# Patient Record
Sex: Female | Born: 1945
Health system: Southern US, Community
[De-identification: ages and names within clinical notes are randomized; demographics above are authoritative.]

## PROBLEM LIST (undated history)

## (undated) DIAGNOSIS — K219 Gastro-esophageal reflux disease without esophagitis: Secondary | ICD-10-CM

## (undated) DIAGNOSIS — F419 Anxiety disorder, unspecified: Secondary | ICD-10-CM

## (undated) DIAGNOSIS — E785 Hyperlipidemia, unspecified: Secondary | ICD-10-CM

## (undated) DIAGNOSIS — I4891 Unspecified atrial fibrillation: Secondary | ICD-10-CM

## (undated) DIAGNOSIS — I10 Essential (primary) hypertension: Secondary | ICD-10-CM

## (undated) DIAGNOSIS — G459 Transient cerebral ischemic attack, unspecified: Secondary | ICD-10-CM

## (undated) DIAGNOSIS — Z923 Personal history of irradiation: Secondary | ICD-10-CM

## (undated) HISTORY — PX: VAGINAL HYSTERECTOMY: SHX2639

## (undated) HISTORY — PX: TONSILLECTOMY: SUR1361

## (undated) HISTORY — PX: RECTOCELE REPAIR: SHX761

## (undated) HISTORY — DX: Transient cerebral ischemic attack, unspecified: G45.9

## (undated) HISTORY — PX: KNEE SURGERY: SHX244

---

## 1999-02-05 ENCOUNTER — Encounter: Payer: Self-pay | Admitting: Gastroenterology

## 1999-02-05 ENCOUNTER — Ambulatory Visit (HOSPITAL_COMMUNITY): Admission: RE | Admit: 1999-02-05 | Discharge: 1999-02-05 | Payer: Self-pay | Admitting: Gastroenterology

## 1999-12-19 ENCOUNTER — Ambulatory Visit (HOSPITAL_BASED_OUTPATIENT_CLINIC_OR_DEPARTMENT_OTHER): Admission: RE | Admit: 1999-12-19 | Discharge: 1999-12-19 | Payer: Self-pay | Admitting: Otolaryngology

## 1999-12-19 ENCOUNTER — Encounter (INDEPENDENT_AMBULATORY_CARE_PROVIDER_SITE_OTHER): Payer: Self-pay

## 2000-10-18 ENCOUNTER — Emergency Department (HOSPITAL_COMMUNITY): Admission: EM | Admit: 2000-10-18 | Discharge: 2000-10-18 | Payer: Self-pay | Admitting: Emergency Medicine

## 2001-01-05 ENCOUNTER — Encounter: Payer: Self-pay | Admitting: Emergency Medicine

## 2001-01-05 ENCOUNTER — Emergency Department (HOSPITAL_COMMUNITY): Admission: EM | Admit: 2001-01-05 | Discharge: 2001-01-05 | Payer: Self-pay | Admitting: Emergency Medicine

## 2001-10-23 ENCOUNTER — Emergency Department (HOSPITAL_COMMUNITY): Admission: EM | Admit: 2001-10-23 | Discharge: 2001-10-23 | Payer: Self-pay | Admitting: Emergency Medicine

## 2002-08-22 ENCOUNTER — Encounter: Payer: Self-pay | Admitting: Family Medicine

## 2002-08-22 ENCOUNTER — Ambulatory Visit (HOSPITAL_COMMUNITY): Admission: RE | Admit: 2002-08-22 | Discharge: 2002-08-22 | Payer: Self-pay | Admitting: Family Medicine

## 2004-03-07 ENCOUNTER — Encounter (INDEPENDENT_AMBULATORY_CARE_PROVIDER_SITE_OTHER): Payer: Self-pay | Admitting: *Deleted

## 2004-03-07 ENCOUNTER — Inpatient Hospital Stay (HOSPITAL_COMMUNITY): Admission: RE | Admit: 2004-03-07 | Discharge: 2004-03-09 | Payer: Self-pay | Admitting: Obstetrics and Gynecology

## 2004-10-27 ENCOUNTER — Ambulatory Visit: Payer: Self-pay | Admitting: Family Medicine

## 2005-01-29 ENCOUNTER — Ambulatory Visit: Payer: Self-pay | Admitting: Family Medicine

## 2005-04-30 ENCOUNTER — Ambulatory Visit: Payer: Self-pay | Admitting: Family Medicine

## 2005-06-15 ENCOUNTER — Ambulatory Visit: Payer: Self-pay | Admitting: Family Medicine

## 2005-09-28 ENCOUNTER — Ambulatory Visit: Payer: Self-pay | Admitting: Family Medicine

## 2005-10-28 ENCOUNTER — Ambulatory Visit: Payer: Self-pay | Admitting: Family Medicine

## 2006-02-03 ENCOUNTER — Ambulatory Visit: Payer: Self-pay | Admitting: Family Medicine

## 2006-05-04 ENCOUNTER — Ambulatory Visit: Payer: Self-pay | Admitting: Family Medicine

## 2006-08-11 ENCOUNTER — Ambulatory Visit: Payer: Self-pay | Admitting: Family Medicine

## 2006-09-29 ENCOUNTER — Ambulatory Visit: Payer: Self-pay | Admitting: Family Medicine

## 2006-12-29 ENCOUNTER — Ambulatory Visit: Payer: Self-pay | Admitting: Family Medicine

## 2007-03-09 ENCOUNTER — Ambulatory Visit: Payer: Self-pay | Admitting: Family Medicine

## 2007-03-30 ENCOUNTER — Ambulatory Visit: Payer: Self-pay | Admitting: Family Medicine

## 2007-05-25 ENCOUNTER — Ambulatory Visit: Payer: Self-pay | Admitting: Family Medicine

## 2009-09-05 ENCOUNTER — Encounter: Admission: RE | Admit: 2009-09-05 | Discharge: 2009-12-04 | Payer: Self-pay | Admitting: Specialist

## 2011-04-24 NOTE — H&P (Signed)
NAME:  [name] Collier, [name] Collier                          ACCOUNT NO.:  [id number]   MEDICAL RECORD NO.:  [id number]                   PATIENT TYPE:  INP   LOCATION:  NA                                   FACILITY:  [facility]   PHYSICIAN:  Katherine Roan, M.D.               DATE OF BIRTH:  12-Apr-1946   DATE OF ADMISSION:  DATE OF DISCHARGE:                                HISTORY & PHYSICAL   CHIEF COMPLAINT:  Pelvic relaxation with pelvic pressure, feeling that  things are falling out, minimal stress incontinence.   HISTORY OF PRESENT ILLNESS:  [name] Collier is a 65 year old gravida 2, para 2,  female who is on Premarin who complains of pelvic pressure, feeling that  things are falling out, and a documented large 3rd degree cystocele and  rectocele.   MEDICATIONS:  Protonix, Cozaar, and Premarin.   ALLERGIES:  LODINE and PREDNISONE.   She has a history of a tubal sterilization, tonsillectomy, vag hys and  questionable repair in the past.   Her medical physician is Dr. Lysbeth Galas.   HEENT:  She wears glasses but no headaches, no dizziness, no change in  visual or auditory acuity.  HEART:  She is followed with hypertension and  fairly well controlled on Cozaar.  No chest pain.  No history of mitral  valve prolapse.  LUNGS:  No chronic cough, no asthma.  GU:  She has minimal  stress incontinence, mostly pelvic pressure and frequency.  GASTROINTESTINAL:  She is treated for reflux.  She denies any bowel habit  change or melena.  No weight loss or gain.  MUSCLES/BONES/JOINTS:  No  fractures or arthritis.   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   FAMILY HISTORY:  Her mother died at 23.  Her father died in his 22s.  She  has one brother and two sisters who are in good health.  She has maternal  and paternal uncle with prostate and intestinal malignancy.  Mother had  hypertension.  She has a sister with lupus and a maternal grandmother with  lupus.   PHYSICAL EXAMINATION:  GENERAL:   Reveals a well-developed, nourished female  who appears to be of stated age.  EYES/EARS/NOSE/THROAT:  Exam is unremarkable.  The oropharynx is not  injected.  NECK:  Supple.  Carotid pulses are equal without bruits.  Trachea is in the  midline.  BREASTS:  No masses or tenderness.  LUNGS:  Clear.  HEART:  Normal sinus rhythm.  No murmurs.  No heaves, thrills, rubs, or  gallops.  ABDOMEN:  Soft.  Liver, spleen or kidneys are not palpated.  No tenderness.  Bowel sounds are normal.  No bruits are heard.  PELVIC:  Examination reveals a 3rd degree cystocele and rectocele.  I do not  detect an enterocele.  No pelvic masses are noted.  EXTREMITIES:  Show good range of motion, equal pulses and  reflexes.   IMPRESSION:  Symptomatic pelvic relaxation with cystocele and rectocele.   PLAN:  Pelvic reconstruction.  We discussed with [name] Collier the risks which  included damage to bladder and bowel, infection and hemorrhage, and also  discussed the failure rate.  I also discussed the fact that she may have  dyspareunia and urge incontinence postop.  Patient expresses understanding  of all the risks and benefits.                                               Katherine Roan, M.D.    SDM/MEDQ  D:  03/05/2004  T:  03/05/2004  Job:  [id number]

## 2011-04-24 NOTE — Discharge Summary (Signed)
NAME:  [name] Collier, [name] Collier                          ACCOUNT NO.:  [id number]   MEDICAL RECORD NO.:  [id number]                   PATIENT TYPE:  INP   LOCATION:  0448                                 FACILITY:  [facility]   PHYSICIAN:  Katherine Roan, M.D.               DATE OF BIRTH:  [birth year]   DATE OF ADMISSION:  03/07/2004  DATE OF DISCHARGE:  03/09/2004                                 DISCHARGE SUMMARY   ADMISSION DIAGNOSES:  Cystocele, rectocele, and enterocele.   COMORBIDITIES:  1. Hypertension.  2. Esophageal reflux.   BRIEF HISTORY:  Ms. Pritts is a 65 year old female who was admitted to the  hospital with symptomatic cystocele and rectocele with pelvic pressure, and  the feeling that things were falling out.  She had had a vaginal  hysterectomy in the past, and questionable anterior repair.   LABORATORY DATA:  Routine chemistry was within normal limits revealing a  creatinine of 0.7.  A white count was 4000, hemoglobin 13.  Echocardiogram  was within normal limits.  Chest x-ray was normal.   HOSPITAL COURSE:  The patient was admitted to the hospital and underwent an  uneventful exam under anesthesia, anterior repair, enterocele repair,  posterior repair, and perineoplasty.  Her postoperative course was  uncomplicated.  She remained afebrile and without complaints.  The day prior  to discharge, she had had some low back pain and was doing well.  Examination on the day of discharge was normal.  There was no CVA  tenderness, no calf tenderness.  The abdomen was soft and non-acute.   DISCHARGE INSTRUCTIONS:  She was discharged with instructions to take  Percocet for pain, to drink plenty of fluids, and take a laxative in 2 days.  She is to stay on her stool softener and her preadmission medication.   CONDITION ON DISCHARGE:  Improved.                                               Katherine Roan, M.D.    SDM/MEDQ  D:  03/13/2004  T:  03/14/2004  Job:  [id number]

## 2011-04-24 NOTE — Op Note (Signed)
[facility]  Patient:    [name] Collier                        MRN: [id number] Proc. Date: 12/19/99 Adm. Date:  [id number] Attending:  Carlean Purl CC:         Delaney Meigs, M.D.                           Operative Report  PREOPERATIVE DIAGNOSIS:  Right neck adenopathy.  POSTOPERATIVE DIAGNOSIS:  Right neck adenopathy.  OPERATION:  Excision of deep right neck node.  SURGEON:  Kristine Garbe. Ezzard Standing, M.D.  ANESTHESIA:  MAC.  COMPLICATIONS:  None.  INDICATION:  [name] Collier is a 65 year old female who has had persistent right eck adenopathy since November.  She first noticed this during a case of bronchitis nd was treated with several rounds of antibiotics.  She has had persistent and enlarged right neck nodes and is taken to the operating room at this time for excisional biopsy of deep right neck node.  DESCRIPTION OF PROCEDURE:  After adequate IV sedation, the patients neck was prepped with Betadine solution and draped out in sterile towels.  The planned excision site was marked and injected with Xylocaine with epinephrine for local  anesthetic.  An incision was made in the mid-upper right neck just anterior to the sternocleidomastoid muscle.  The external jugular vein was transected and ligated with 3-0 chromic suture.  The sternocleidomastoid muscle was reflected posteriorly and the jugular chain of lymph nodes were identified.  [name] Collier had several enlarged high jugular lymph nodes.  These measured approximately 1.5 to 2 cm in size. Several of the lymph nodes were removed and sent to pathology fresh in saline.  Hemostasis was obtained with electrocautery and 3-0 chromic ligatures.  Wound was irrigated with saline and closed with 3-0 chromic sutures subcutaneously and 5-0 nylon on the skin.  [name] Collier tolerated the procedure well and was subsequently discharged home later this morning.  Of note, she received 1 gm  Ancef IV intraoperatively after removal of  lymph node.  DISPOSITION:  [name] Collier is discharged home on Tylenol and Tylenol No. 3 p.r.n. pain. We will have her follow up in my office in six days to review pathology and have sutures removed. DD:  12/19/99 TD:  12/19/99 Job: [id number]

## 2011-04-24 NOTE — Op Note (Signed)
NAME:  [name] Collier, [name] Collier                          ACCOUNT NO.:  [id number]   MEDICAL RECORD NO.:  [id number]                   PATIENT TYPE:  INP   LOCATION:  0006                                 FACILITY:  [facility]   PHYSICIAN:  Katherine Roan, M.D.               DATE OF BIRTH:  1946-08-30   DATE OF PROCEDURE:  03/07/2004  DATE OF DISCHARGE:                                 OPERATIVE REPORT   PREOPERATIVE DIAGNOSIS:  Pelvic relaxation.   POSTOPERATIVE DIAGNOSIS:  Pelvic relaxation.   OPERATION:  1. Pelvic exam under anesthesia.  2. Anterior repair.  3. Enterocele repair.  4. Posterior repair.  5. Perineoplasty.   DESCRIPTION OF PROCEDURE:  The patient was placed in lithotomy and  positioned correctly with Allen stirrups.  The bladder was quite full.  No  other pelvic masses were noted.  She was then prepped and draped in the  usual fashion, and the apex of the vagina was grasped with Allis clamps.  We  infiltrated the vagina with 1% Xylocaine with epinephrine and dissected the  enterocele with sharp dissection.  We then dissected anteriorly and  dissected the vagina from the underlying pubocervical vaginal fascia.  The  enterocele repair was then effected by closing this defect with interrupted  sutures of 3-0 Ethibond and 3-0 and 4-0 Vicryl.  The cystocele repair was  then performed using interrupted sutures of 3-0 Vicryl and 3-0 Ethibond.  The pubocervical fascia was plicated in the midline quite nicely.  Following  this, hemostasis was accomplished with the Bovie.  Wedge of the vagina was  then made and the vagina pushed from the enterocele repair was closed with  subcutaneous 3-0 Vicryl.  Anteriorly, we sutured the apex with one suture of  3-0 Vicryl, following which we closed the remaining portion of the vagina  and enterocele repair with a running locking suture of 3-0 chromic.  Following this, we then went to the rectocele, made a wedge resection in the  perineal body, and  then excised this revealing the underlying transverse  perinei muscle which had been divided.  The vagina was then dissected, and  the prerectal fascia was identified and then closed with interrupted sutures  of 3-0 Vicryl and 3-0 Ethibond.  Excellent repair was then obtained.  Multiple small sutures were used for the repair.  Following this, I closed  the vagina with one identifying suture of 3-0 Vicryl, following which we  used a 3-0 chromic locking suture for the posterior vault as well.  The  bulbocavernosus muscle was then approximated with interrupted sutures of 3-0  Vicryl, and the transverse perinei muscle was closed with this suture.  The  skin was closed with two subcuticular 3-0 Vicryls.  The crown stitch was  then utilized from the bulbocavernosus muscle at the fourchette.  Then the  vagina was closed with a continuation of the 3-0 chromic suture.  The  subcuticular suture was used to close the perineoplasty.  Following this,  the vagina was of excellent length, and I inserted an Iodoform pack and then  infiltrated the vagina with 20 mL of 1% Xylocaine with epinephrine.  [name] tolerated the procedure well and was sent to the recovery room in  good condition.                                               Katherine Roan, M.D.    SDM/MEDQ  D:  03/07/2004  T:  03/07/2004  Job:  [id number]

## 2011-04-27 ENCOUNTER — Other Ambulatory Visit: Payer: Self-pay | Admitting: Gastroenterology

## 2011-07-07 ENCOUNTER — Ambulatory Visit: Payer: Self-pay | Admitting: Physical Therapy

## 2011-07-14 ENCOUNTER — Ambulatory Visit: Payer: Managed Care, Other (non HMO) | Attending: Specialist | Admitting: Physical Therapy

## 2011-07-14 DIAGNOSIS — IMO0001 Reserved for inherently not codable concepts without codable children: Secondary | ICD-10-CM | POA: Insufficient documentation

## 2011-07-14 DIAGNOSIS — R5381 Other malaise: Secondary | ICD-10-CM | POA: Insufficient documentation

## 2011-07-14 DIAGNOSIS — M25569 Pain in unspecified knee: Secondary | ICD-10-CM | POA: Insufficient documentation

## 2011-07-14 DIAGNOSIS — R269 Unspecified abnormalities of gait and mobility: Secondary | ICD-10-CM | POA: Insufficient documentation

## 2011-07-14 DIAGNOSIS — M25669 Stiffness of unspecified knee, not elsewhere classified: Secondary | ICD-10-CM | POA: Insufficient documentation

## 2011-07-16 ENCOUNTER — Ambulatory Visit: Payer: Managed Care, Other (non HMO) | Admitting: Physical Therapy

## 2011-07-20 ENCOUNTER — Ambulatory Visit: Payer: Managed Care, Other (non HMO) | Admitting: Physical Therapy

## 2011-07-22 ENCOUNTER — Ambulatory Visit: Payer: Managed Care, Other (non HMO) | Admitting: Physical Therapy

## 2011-07-24 ENCOUNTER — Ambulatory Visit: Payer: Managed Care, Other (non HMO) | Admitting: Physical Therapy

## 2011-07-27 ENCOUNTER — Ambulatory Visit: Payer: Managed Care, Other (non HMO) | Admitting: Physical Therapy

## 2011-07-29 ENCOUNTER — Ambulatory Visit: Payer: Managed Care, Other (non HMO) | Admitting: Physical Therapy

## 2011-07-31 ENCOUNTER — Ambulatory Visit: Payer: Managed Care, Other (non HMO) | Admitting: *Deleted

## 2011-08-05 ENCOUNTER — Ambulatory Visit: Payer: Managed Care, Other (non HMO) | Admitting: Physical Therapy

## 2011-08-11 ENCOUNTER — Ambulatory Visit: Payer: Medicare Other | Attending: Specialist | Admitting: Physical Therapy

## 2011-08-11 DIAGNOSIS — IMO0001 Reserved for inherently not codable concepts without codable children: Secondary | ICD-10-CM | POA: Insufficient documentation

## 2011-08-11 DIAGNOSIS — M25569 Pain in unspecified knee: Secondary | ICD-10-CM | POA: Insufficient documentation

## 2011-08-11 DIAGNOSIS — R5381 Other malaise: Secondary | ICD-10-CM | POA: Insufficient documentation

## 2011-08-11 DIAGNOSIS — M25669 Stiffness of unspecified knee, not elsewhere classified: Secondary | ICD-10-CM | POA: Insufficient documentation

## 2011-08-11 DIAGNOSIS — R269 Unspecified abnormalities of gait and mobility: Secondary | ICD-10-CM | POA: Insufficient documentation

## 2011-08-13 ENCOUNTER — Ambulatory Visit: Payer: Medicare Other | Admitting: Physical Therapy

## 2011-08-17 ENCOUNTER — Encounter: Payer: Managed Care, Other (non HMO) | Admitting: Physical Therapy

## 2013-04-05 ENCOUNTER — Other Ambulatory Visit: Payer: Self-pay | Admitting: Gastroenterology

## 2014-04-12 DIAGNOSIS — R591 Generalized enlarged lymph nodes: Secondary | ICD-10-CM | POA: Insufficient documentation

## 2016-02-26 ENCOUNTER — Emergency Department (HOSPITAL_COMMUNITY): Payer: Medicare Other

## 2016-02-26 ENCOUNTER — Observation Stay (HOSPITAL_COMMUNITY): Payer: Medicare Other

## 2016-02-26 ENCOUNTER — Observation Stay (HOSPITAL_COMMUNITY)
Admission: EM | Admit: 2016-02-26 | Discharge: 2016-02-27 | Disposition: A | Discharge status: Home / Self Care | Payer: Medicare Other | Attending: Oncology | Admitting: Oncology

## 2016-02-26 ENCOUNTER — Encounter (HOSPITAL_COMMUNITY): Payer: Self-pay | Admitting: Neurology

## 2016-02-26 DIAGNOSIS — F419 Anxiety disorder, unspecified: Secondary | ICD-10-CM | POA: Diagnosis not present

## 2016-02-26 DIAGNOSIS — E785 Hyperlipidemia, unspecified: Secondary | ICD-10-CM | POA: Diagnosis not present

## 2016-02-26 DIAGNOSIS — K219 Gastro-esophageal reflux disease without esophagitis: Secondary | ICD-10-CM | POA: Insufficient documentation

## 2016-02-26 DIAGNOSIS — Z823 Family history of stroke: Secondary | ICD-10-CM | POA: Diagnosis not present

## 2016-02-26 DIAGNOSIS — R531 Weakness: Secondary | ICD-10-CM | POA: Insufficient documentation

## 2016-02-26 DIAGNOSIS — Z7982 Long term (current) use of aspirin: Secondary | ICD-10-CM | POA: Diagnosis not present

## 2016-02-26 DIAGNOSIS — D649 Anemia, unspecified: Secondary | ICD-10-CM | POA: Diagnosis not present

## 2016-02-26 DIAGNOSIS — Z880 Allergy status to penicillin: Secondary | ICD-10-CM | POA: Diagnosis not present

## 2016-02-26 DIAGNOSIS — G458 Other transient cerebral ischemic attacks and related syndromes: Secondary | ICD-10-CM

## 2016-02-26 DIAGNOSIS — G459 Transient cerebral ischemic attack, unspecified: Principal | ICD-10-CM | POA: Insufficient documentation

## 2016-02-26 DIAGNOSIS — R4701 Aphasia: Secondary | ICD-10-CM

## 2016-02-26 DIAGNOSIS — Z66 Do not resuscitate: Secondary | ICD-10-CM | POA: Diagnosis not present

## 2016-02-26 DIAGNOSIS — R4781 Slurred speech: Secondary | ICD-10-CM | POA: Diagnosis not present

## 2016-02-26 DIAGNOSIS — Z6841 Body Mass Index (BMI) 40.0 and over, adult: Secondary | ICD-10-CM | POA: Insufficient documentation

## 2016-02-26 DIAGNOSIS — I10 Essential (primary) hypertension: Secondary | ICD-10-CM | POA: Insufficient documentation

## 2016-02-26 DIAGNOSIS — Z90710 Acquired absence of both cervix and uterus: Secondary | ICD-10-CM | POA: Insufficient documentation

## 2016-02-26 DIAGNOSIS — R471 Dysarthria and anarthria: Secondary | ICD-10-CM | POA: Diagnosis not present

## 2016-02-26 HISTORY — DX: Gastro-esophageal reflux disease without esophagitis: K21.9

## 2016-02-26 HISTORY — DX: Hyperlipidemia, unspecified: E78.5

## 2016-02-26 HISTORY — DX: Essential (primary) hypertension: I10

## 2016-02-26 LAB — DIFFERENTIAL
Basophils Absolute: 0.1 10*3/uL (ref 0.0–0.1)
Basophils Relative: 1 %
EOS PCT: 2 %
Eosinophils Absolute: 0.1 10*3/uL (ref 0.0–0.7)
LYMPHS PCT: 26 %
Lymphs Abs: 2 10*3/uL (ref 0.7–4.0)
MONO ABS: 0.7 10*3/uL (ref 0.1–1.0)
MONOS PCT: 9 %
Neutro Abs: 4.8 10*3/uL (ref 1.7–7.7)
Neutrophils Relative %: 62 %

## 2016-02-26 LAB — RAPID URINE DRUG SCREEN, HOSP PERFORMED
AMPHETAMINES: NOT DETECTED
Barbiturates: NOT DETECTED
Benzodiazepines: NOT DETECTED
Cocaine: NOT DETECTED
OPIATES: NOT DETECTED
Tetrahydrocannabinol: NOT DETECTED

## 2016-02-26 LAB — I-STAT CHEM 8, ED
BUN: 14 mg/dL (ref 6–20)
CHLORIDE: 103 mmol/L (ref 101–111)
Calcium, Ion: 1.09 mmol/L — ABNORMAL LOW (ref 1.13–1.30)
Creatinine, Ser: 0.7 mg/dL (ref 0.44–1.00)
Glucose, Bld: 102 mg/dL — ABNORMAL HIGH (ref 65–99)
HEMATOCRIT: 36 % (ref 36.0–46.0)
Hemoglobin: 12.2 g/dL (ref 12.0–15.0)
POTASSIUM: 4.4 mmol/L (ref 3.5–5.1)
SODIUM: 140 mmol/L (ref 135–145)
TCO2: 26 mmol/L (ref 0–100)

## 2016-02-26 LAB — CBC
HEMATOCRIT: 34.8 % — AB (ref 36.0–46.0)
Hemoglobin: 10.7 g/dL — ABNORMAL LOW (ref 12.0–15.0)
MCH: 27 pg (ref 26.0–34.0)
MCHC: 30.7 g/dL (ref 30.0–36.0)
MCV: 87.9 fL (ref 78.0–100.0)
PLATELETS: 287 10*3/uL (ref 150–400)
RBC: 3.96 MIL/uL (ref 3.87–5.11)
RDW: 14.8 % (ref 11.5–15.5)
WBC: 7.7 10*3/uL (ref 4.0–10.5)

## 2016-02-26 LAB — COMPREHENSIVE METABOLIC PANEL
ALK PHOS: 66 U/L (ref 38–126)
ALT: 30 U/L (ref 14–54)
ANION GAP: 9 (ref 5–15)
AST: 34 U/L (ref 15–41)
Albumin: 3.2 g/dL — ABNORMAL LOW (ref 3.5–5.0)
BUN: 12 mg/dL (ref 6–20)
CALCIUM: 9.1 mg/dL (ref 8.9–10.3)
CO2: 26 mmol/L (ref 22–32)
CREATININE: 0.74 mg/dL (ref 0.44–1.00)
Chloride: 106 mmol/L (ref 101–111)
Glucose, Bld: 107 mg/dL — ABNORMAL HIGH (ref 65–99)
Potassium: 4.4 mmol/L (ref 3.5–5.1)
SODIUM: 141 mmol/L (ref 135–145)
TOTAL PROTEIN: 6.8 g/dL (ref 6.5–8.1)
Total Bilirubin: 0.9 mg/dL (ref 0.3–1.2)

## 2016-02-26 LAB — URINALYSIS, ROUTINE W REFLEX MICROSCOPIC
Bilirubin Urine: NEGATIVE
Glucose, UA: NEGATIVE mg/dL
Hgb urine dipstick: NEGATIVE
Ketones, ur: NEGATIVE mg/dL
LEUKOCYTES UA: NEGATIVE
NITRITE: NEGATIVE
PH: 6.5 (ref 5.0–8.0)
Protein, ur: NEGATIVE mg/dL

## 2016-02-26 LAB — I-STAT TROPONIN, ED: TROPONIN I, POC: 0 ng/mL (ref 0.00–0.08)

## 2016-02-26 LAB — PROTIME-INR
INR: 1.1 (ref 0.00–1.49)
PROTHROMBIN TIME: 14.4 s (ref 11.6–15.2)

## 2016-02-26 LAB — ETHANOL

## 2016-02-26 LAB — APTT: aPTT: 30 seconds (ref 24–37)

## 2016-02-26 MED ORDER — ACETAMINOPHEN 325 MG PO TABS: 650.0000 mg | ORAL_TABLET | Freq: Four times a day (QID) | ORAL | Status: DC | PRN

## 2016-02-26 MED ORDER — PANTOPRAZOLE SODIUM 40 MG PO TBEC
80.0000 mg | DELAYED_RELEASE_TABLET | Freq: Every day | ORAL | Status: DC
  Administered 2016-02-27: 80 mg via ORAL
  Filled 2016-02-26: qty 2

## 2016-02-26 MED ORDER — SENNOSIDES-DOCUSATE SODIUM 8.6-50 MG PO TABS: 1.0000 | ORAL_TABLET | Freq: Every evening | ORAL | Status: DC | PRN

## 2016-02-26 MED ORDER — ENOXAPARIN SODIUM 40 MG/0.4ML ~~LOC~~ SOLN: 40.0000 mg | SUBCUTANEOUS | Status: DC

## 2016-02-26 MED ORDER — STUDY - INVESTIGATIONAL DRUG SIMPLE RECORD
600.0000 mg | Status: AC
  Administered 2016-02-26: 600 mg via ORAL
  Filled 2016-02-26: qty 0.12

## 2016-02-26 MED ORDER — ASPIRIN 325 MG PO TABS
325.0000 mg | ORAL_TABLET | Freq: Every day | ORAL | Status: DC
  Administered 2016-02-27: 325 mg via ORAL
  Filled 2016-02-26: qty 1

## 2016-02-26 MED ORDER — ESCITALOPRAM OXALATE 10 MG PO TABS
10.0000 mg | ORAL_TABLET | Freq: Every day | ORAL | Status: DC
  Filled 2016-02-26: qty 1

## 2016-02-26 MED ORDER — STUDY - INVESTIGATIONAL DRUG SIMPLE RECORD
75.0000 mg | Freq: Every day | Status: DC
  Administered 2016-02-27: 75 mg via ORAL
  Filled 2016-02-26: qty 0.01

## 2016-02-26 MED ORDER — VITAMIN D 1000 UNITS PO TABS
1000.0000 [IU] | ORAL_TABLET | Freq: Every day | ORAL | Status: DC
  Administered 2016-02-27: 1000 [IU] via ORAL
  Filled 2016-02-26: qty 1

## 2016-02-26 MED ORDER — STROKE: EARLY STAGES OF RECOVERY BOOK
Freq: Once | Status: DC
  Filled 2016-02-26: qty 1

## 2016-02-26 NOTE — H&P (Signed)
Date: 02/26/2016               Patient Name:  [name] Collier MRN: [id number]  DOB: December 31, 1945 Age / Sex: 70 y.o., female   PCP: No primary care provider on file.         Medical Service: Internal Medicine Teaching Service         Attending Physician: Dr. Annia Belt, MD    First Contact: [name], MS4 Pager: [phone]  Second Contact: Dr. Randell Patient Pager: [phone]       After Hours (After 5p/  First Contact Pager: [phone]  weekends / holidays): Second Contact Pager: [phone]   Chief Complaint: L arm weakness and slurred speech  History of Present Illness: 70 year old woman with history of GERD, HTN, anxiety presenting with slurred speech and left arm weakness. She woke up at 7AM today. She went to lie down on her sofa at around [facility] for a few minutes. The phone rang and she tried to pick up her phone. She noticed her left arm was weak. She had word finding difficulty initially then slurred speech. This lasted about 1 minute. She had recovery of her arm strength and her speech returned to baseline. She took two tablets of aspirin 325mg . She does not take ASA daily. She is ambidextrous. 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   Meds: No current facility-administered medications for this encounter.   Current Outpatient Prescriptions  Medication Sig Dispense Refill  . aspirin 325 MG tablet Take 650 mg by mouth daily.    . cholecalciferol (VITAMIN D) 1000 units tablet Take 1,000 Units by mouth daily.    . Cyanocobalamin (VITAMIN B 12 PO) Take 1 tablet by mouth daily.    Marland Kitchen escitalopram (LEXAPRO) 10 MG tablet Take 10 mg by mouth daily.    Marland Kitchen olmesartan-hydrochlorothiazide (BENICAR HCT) 40-25 MG tablet Take 1 tablet by mouth daily.    Marland Kitchen omeprazole (PRILOSEC) 40 MG capsule Take 40 mg by mouth daily.      Allergies: Allergies as of 02/26/2016 - Review Complete 02/26/2016  Allergen Reaction Noted  . Augmentin [amoxicillin-pot clavulanate]   02/26/2016   Past Medical History  Diagnosis Date  . HTN (hypertension)   . Hyperlipidemia   . GERD (gastroesophageal reflux disease)     Past Surgical History Hysterectomy 1980 Cataract surgery Arthroscopic surgery on both knees  Family History  Problem Relation Age of Onset  . Hypertension Father   . Hypertension Mother   . Hyperlipidemia Mother   . Hyperlipidemia Father   No family history of CAD Maternal Uncles with CVA Son had CVA in October  Social History   Social History  . Marital Status: Married    Spouse Name: N/A  . Number of Children: N/A  . Years of Education: N/A   Occupational History  . Not on file.   Social History Main Topics  . Smoking status: Not on file  . Smokeless tobacco: Not on file  . Alcohol Use: Not on file  . Drug Use: Not on file  . Sexual Activity: Not on file   Other Topics Concern  . Not on file   Social History Narrative  . No narrative on file  Never smoker No alcohol No drugs Retired Engineer, technical sales  Review of Systems: Constitutional: no fevers/chills Eyes: no vision changes Ears, nose, mouth, throat, and face: no cough Respiratory: no shortness of breath Cardiovascular: no chest pain  Gastrointestinal: no nausea/vomiting, no abdominal pain, no constipation, no diarrhea Genitourinary: no dysuria, no hematuria Integument: no rash Hematologic/lymphatic: no bleeding/bruising, no edema Musculoskeletal: no arthralgias, no myalgias Neurological: no paresthesias   Physical Exam: Blood pressure 141/49, pulse 66, temperature 98 F (36.7 C), resp. rate 22, SpO2 95 %. General Apperance: NAD Head: Normocephalic, atraumatic Eyes: PERRL, EOMI, anicteric sclera Ears: Normal external ear canal Nose: Nares normal, septum midline, mucosa normal Throat: Lips, mucosa and tongue normal  Neck: Supple, trachea midline Back: No tenderness or bony abnormality  Lungs: Clear to auscultation bilaterally. No wheezes,  rhonchi or rales. Breathing comfortably Chest Wall: Nontender, no deformity Heart: Regular rate and rhythm, no murmur/rub/gallop Abdomen: Soft, nontender, nondistended, no rebound/guarding Extremities: Normal, atraumatic, warm and well perfused, no edema Pulses: 2+ throughout Skin: No rashes or lesions Neurologic: Alert, oriented, thought content appropriate. Speech fluent without evidence of aphasia. Difficulty with step 3 of 3 step commands but otherwise able to follow commands without difficulty. Cranial Nerves II to XII intact. Strength 5/5 bilaterally with normal tone and bulk. Normal sensory throughout. DTR 1+ and symmetric. Plantars downgoing. Normal finger-to-nose testing.  Lab results: Basic Metabolic Panel:  Recent Labs  02/26/16 1315 02/26/16 1320  NA 140 141  K 4.4 4.4  CL 103 106  CO2  --  26  GLUCOSE 102* 107*  BUN 14 12  CREATININE 0.70 0.74  CALCIUM  --  9.1   Liver Function Tests:  Recent Labs  02/26/16 1320  AST 34  ALT 30  ALKPHOS 66  BILITOT 0.9  PROT 6.8  ALBUMIN 3.2*   CBC:  Recent Labs  02/26/16 1315 02/26/16 1320  WBC  --  7.7  NEUTROABS  --  4.8  HGB 12.2 10.7*  HCT 36.0 34.8*  MCV  --  87.9  PLT  --  287   Coagulation:  Recent Labs  02/26/16 1320  LABPROT 14.4  INR 1.10   Urine Drug Screen: Drugs of Abuse     Component Value Date/Time   LABOPIA NONE DETECTED 02/26/2016 1338   COCAINSCRNUR NONE DETECTED 02/26/2016 1338   LABBENZ NONE DETECTED 02/26/2016 1338   AMPHETMU NONE DETECTED 02/26/2016 1338   THCU NONE DETECTED 02/26/2016 1338   LABBARB NONE DETECTED 02/26/2016 1338    Alcohol Level:  Recent Labs  02/26/16 1321  ETH <5   Urinalysis:  Recent Labs  02/26/16 [place]  LABSPEC <1.005*  PHURINE 6.5  GLUCOSEU NEGATIVE  HGBUR NEGATIVE  BILIRUBINUR NEGATIVE  KETONESUR NEGATIVE  PROTEINUR NEGATIVE  NITRITE NEGATIVE  LEUKOCYTESUR NEGATIVE   Imaging results:  Ct Head Wo  Contrast  02/26/2016  CLINICAL DATA:  70 year old female with a history of hypertension. Transient weakness and question of TIA EXAM: CT HEAD WITHOUT CONTRAST TECHNIQUE: Contiguous axial images were obtained from the base of the skull through the vertex without intravenous contrast. COMPARISON:  None. FINDINGS: Unremarkable appearance of the calvarium without acute fracture or aggressive lesion. Unremarkable appearance of the scalp soft tissues. Unremarkable appearance of the bilateral orbits. Mastoid air cells are clear. No significant paranasal sinus disease No acute intracranial hemorrhage, midline shift, or mass effect. Gray-white differentiation is maintained, without CT evidence of acute ischemia. Unremarkable configuration of the ventricles. IMPRESSION: No CT evidence of acute intracranial abnormality. Signed, Dulcy Fanny. Earleen Newport, DO [facility] Electronically Signed   By: Corrie Mckusick D.O.   On: 02/26/2016 13:30    Other results: EKG: NSR, no past EKG for  comparison  Assessment & Plan by Problem: 70 year old woman with history of GERD, HTN, anxiety presenting with 1 minute episode of slurred speech and left arm weakness.  TIA: She has multiple risk factors for stroke including HLD, HTN. CT head demonstrates no acute intracranial abnormality. EKG demonstrates NSR, no arrhythmias or acute ischemic changes. Initial troponin negative. ABCD2 score 4 - 2 day stroke risk (4.1 percent) -Admit to telemetry -MRI/MRA brain without contrast -Carotid dopplers -Echo -Obtain HgbA1c, fasting lipid panel -PT/OT -Continue ASA 325mg  daily  HTN: Held home olmesartan-HCTZ for now.  Anxiety: Cont home Lexapro 10mg  daily.  GERD: Cont home omeprazole as pantoprazole.  VTE ppx: Lovenox  Code: Confirmed with patient DNR status.  Dispo: Disposition is deferred at this time, awaiting improvement of current medical problems. Anticipated discharge in  approximately 1-2 day(s).   The patient does have a current PCP (No primary care provider on file.) and does not need an [facility] hospital follow-up appointment after discharge.  The patient does not have transportation limitations that hinder transportation to clinic appointments.  Signed: Milagros Loll, MD 02/26/2016, 2:49 PM

## 2016-02-26 NOTE — Progress Notes (Signed)
Pt. Has passed swallow screen, diet to be placed.

## 2016-02-26 NOTE — Consult Note (Addendum)
Requesting Physician: Dr. Tamera Punt    Chief Complaint: TIA  History obtained from:  Patient     HPI:                                                                                                                                         [name] Collier is an 70 y.o. female who presents after an episode of slurred speech and left arm weakness. She states that 9:30 02/26/2016 she heard the phone ringing and noticed that she couldn't move her left arm to pick up the phone. She used her right arm to pick up the phone and noted that she couldn't talk. She knew that she went to say hello but she couldn't get her words out. She states shortly after that she was able to say hello but it was slurred. Just prior to this incident, she was normal and can move all extremities normally. She states this lasted about 1-2 minutes. Following that she was able to move all extremities normally and speak normally. She drove to her PCP who called EMS and she was transported here. She's had no further symptoms.  She admits to not taking ASA daily, but did take 650 mg this AM after her symptoms started. Her symptoms were resolved at the time of bedside Neurological evaluation.   Date last known well: 3.21.2017 Time last known well: Time: 09:30 tPA Given: No: symptoms resolved   Past Medical History  Diagnosis Date  . HTN (hypertension)   . Hyperlipidemia   . GERD (gastroesophageal reflux disease)     No past surgical history on file.  Family History  Problem Relation Age of Onset  . Hypertension Father   . Hypertension Mother   . Hyperlipidemia Mother   . Hyperlipidemia Father    Social History:  has no tobacco, alcohol, and drug history on file.  Allergies:  Allergies  Allergen Reactions  . Augmentin [Amoxicillin-Pot Clavulanate]     c-diff    Medications:                                                                                                                           No current  facility-administered medications for this encounter.   Current Outpatient Prescriptions  Medication Sig Dispense Refill  . aspirin 325 MG tablet Take 650 mg by mouth daily.    . cholecalciferol (  VITAMIN D) 1000 units tablet Take 1,000 Units by mouth daily.    . Cyanocobalamin (VITAMIN B 12 PO) Take 1 tablet by mouth daily.    Marland Kitchen escitalopram (LEXAPRO) 10 MG tablet Take 10 mg by mouth daily.    Marland Kitchen olmesartan-hydrochlorothiazide (BENICAR HCT) 40-25 MG tablet Take 1 tablet by mouth daily.    Marland Kitchen omeprazole (PRILOSEC) 40 MG capsule Take 40 mg by mouth daily.       ROS:                                                                                                                                       History obtained from the patient  General ROS: negative for - chills, fatigue, fever, night sweats, weight gain or weight loss Psychological ROS: negative for - behavioral disorder, hallucinations, memory difficulties, mood swings or suicidal ideation Ophthalmic ROS: negative for - blurry vision, double vision, eye pain or loss of vision ENT ROS: negative for - epistaxis, nasal discharge, oral lesions, sore throat, tinnitus or vertigo Allergy and Immunology ROS: negative for - hives or itchy/watery eyes Hematological and Lymphatic ROS: negative for - bleeding problems, bruising or swollen lymph nodes Endocrine ROS: negative for - galactorrhea, hair pattern changes, polydipsia/polyuria or temperature intolerance Respiratory ROS: negative for - cough, hemoptysis, shortness of breath or wheezing Cardiovascular ROS: negative for - chest pain, dyspnea on exertion, edema or irregular heartbeat Gastrointestinal ROS: negative for - abdominal pain, diarrhea, hematemesis, nausea/vomiting or stool incontinence Genito-Urinary ROS: negative for - dysuria, hematuria, incontinence or urinary frequency/urgency Musculoskeletal ROS: negative for - joint swelling or muscular weakness Neurological ROS: as noted in  HPI Dermatological ROS: negative for rash and skin lesion changes  Neurologic Examination:                                                                                                      Blood pressure 141/49, pulse 66, temperature 98 F (36.7 C), resp. rate 22, SpO2 95 %.  HEENT-  Normocephalic, no lesions, without obvious abnormality.  Normal external eye and conjunctiva.  Normal TM's bilaterally.  Normal auditory canals and external ears. Normal external nose, mucus membranes and septum.  Normal pharynx. Cardiovascular- S1, S2 normal, pulses palpable throughout   Lungs- chest clear, no wheezing, rales, normal symmetric air entry Abdomen- normal findings: bowel sounds normal Extremities- no edema Lymph-no adenopathy palpable Musculoskeletal-no joint tenderness, deformity or swelling Skin-warm and dry, no hyperpigmentation, vitiligo, or suspicious lesions  Neurological Examination Mental Status:  Alert, oriented, thought content appropriate.  Speech fluent without evidence of aphasia.  Able to follow 2 step commands without difficulty - some difficulty with 3 step commands. Named objects well, repeated well.  Cranial Nerves: II: Visual fields grossly normal, pupils equal, round, reactive to light and accommodation III,IV, VI: ptosis not present, extra-ocular motions intact bilaterally V,VII: smile symmetric, facial light touch sensation normal bilaterally VIII: hearing normal bilaterally IX,X: uvula rises symmetrically XI: bilateral shoulder shrug XII: midline tongue extension Motor: Right : Upper extremity   5/5    Left:     Upper extremity   5/5  Lower extremity   5/5     Lower extremity   5/5 Tone and bulk:normal tone throughout; no atrophy noted Sensory: Pinprick and light touch intact throughout, bilaterally Deep Tendon Reflexes: 1+ and symmetric throughout Plantars: Right: downgoing   Left: downgoing Cerebellar: normal finger-to-nose, and normal heel-to-shin test Gait:  not tested   Lab Results: Basic Metabolic Panel:  Recent Labs Lab 02/26/16 1315 02/26/16 1320  NA 140 141  K 4.4 4.4  CL 103 106  CO2  --  26  GLUCOSE 102* 107*  BUN 14 12  CREATININE 0.70 0.74  CALCIUM  --  9.1    Liver Function Tests:  Recent Labs Lab 02/26/16 1320  AST 34  ALT 30  ALKPHOS 66  BILITOT 0.9  PROT 6.8  ALBUMIN 3.2*   No results for input(s): LIPASE, AMYLASE in the last 168 hours. No results for input(s): AMMONIA in the last 168 hours.  CBC:  Recent Labs Lab 02/26/16 1315 02/26/16 1320  WBC  --  7.7  NEUTROABS  --  4.8  HGB 12.2 10.7*  HCT 36.0 34.8*  MCV  --  87.9  PLT  --  287    Cardiac Enzymes: No results for input(s): CKTOTAL, CKMB, CKMBINDEX, TROPONINI in the last 168 hours.  Lipid Panel: No results for input(s): CHOL, TRIG, HDL, CHOLHDL, VLDL, LDLCALC in the last 168 hours.  CBG: No results for input(s): GLUCAP in the last 168 hours.  Microbiology: No results found for this or any previous visit.  Coagulation Studies:  Recent Labs  02/26/16 1320  LABPROT 14.4  INR 1.10    Imaging: Ct Head Wo Contrast  02/26/2016  CLINICAL DATA:  70 year old female with a history of hypertension. Transient weakness and question of TIA EXAM: CT HEAD WITHOUT CONTRAST TECHNIQUE: Contiguous axial images were obtained from the base of the skull through the vertex without intravenous contrast. COMPARISON:  None. FINDINGS: Unremarkable appearance of the calvarium without acute fracture or aggressive lesion. Unremarkable appearance of the scalp soft tissues. Unremarkable appearance of the bilateral orbits. Mastoid air cells are clear. No significant paranasal sinus disease No acute intracranial hemorrhage, midline shift, or mass effect. Gray-white differentiation is maintained, without CT evidence of acute ischemia. Unremarkable configuration of the ventricles. IMPRESSION: No CT evidence of acute intracranial abnormality. Signed, Dulcy Fanny. Earleen Newport, DO  [facility] Electronically Signed   By: Corrie Mckusick D.O.   On: 02/26/2016 13:30    Assessment and plan discussed with attending physician and they are in agreement.    Etta Quill PA-C [facility] [phone] 02/26/2016, 3:44 PM   Assessment: 70 y.o. female with transient expressive aphasia and left arm weakness. She is ambidextrous which may account for her speech alteration in the setting of left rather than right arm weakness. Currently symptoms resolved and CT head showing no acute abnormalities. Subtle comprehension  deficit noted on exam with 3 step commands, but this may fall within her normal range as well. She would benefit from TIA/Stroke work up.   Stroke Risk Factors - hyperlipidemia and hypertension   Recommend: 1. HgbA1c, fasting lipid panel 2. MRI and MRA  of the brain without contrast 3. PT consult, OT consult, Speech consult 4. Echocardiogram 5. Carotid dopplers 6. Prophylactic therapy-Antiplatelet med: Aspirin - dose 325 mg PO daily after passing stroke swallow screen.  7. Risk factor modification 8. Telemetry monitoring 9. Frequent neuro checks 10. NPO until passes stroke swallow screen 11. Permissive HTN x 24 hours.  12. Start Lipitor 40 mg po qd 13. Please page stroke NP  Or  PA  Or MD  M-F from 8am -4 pm as she is now followed by the stroke team at this point.   You can look them up on [email or web address]  Password [email or web address]    Kerney Elbe, MD

## 2016-02-26 NOTE — H&P (Signed)
Date: 02/26/2016               Patient Name:  [name] Collier MRN: [id number]  DOB: 04-Dec-1946 Age / Sex: 70 y.o., female   PCP: No primary care provider on file.              Medical Service: Internal Medicine Teaching Service              Attending Physician: Dr. Annia Belt, MD    First Contact: [name], [name] 4 Pager: [phone]  Second Contact: Dr. Jacques Earthly Pager: [phone]            After Hours (After 5p/  First Contact Pager: [phone]  weekends / holidays): Second Contact Pager: [phone]   Chief Complaint: Left arm weakness and inability to speak  History of Present Illness: [name] Collier is a 70 y.o. Ambidextrous nonsmoker F with hx including htn who presents after experiencing a 1 minute episode of inability to move her left hand and inability to speak this morning at 9AM. She heard the phone ring and realized she couldn't move her left arm to pick it up. When she did pick it up with her right arm she realized she was unable to speak. She denies loss of consciousness during this episode. She does not take aspirin regularly but took two doses this AM after this episode.  She also states that she has had 2 episodes of feeling her heart racing over the previous weeks. One episode was several weeks ago and lasted for a week and the other was on Sunday and lasted for 2 hours. She denies diaphoresis or chest pain during these episodes or at any other time.  She denies previous episodes of syncope or seizures. She denies fevers, chills, cough, nausea, vomiting, tongue biting, urinary incontinence, shortness of breath, diarrhea, burning with urination. She does state she had a CT done several years ago that possibly showed evidence of small infarcts.  In the ED, EKG showed sinus rhythm. CT showed no evidence of acute hemorrhage. Patient was placed on cardiac monitoring without any acute events.    Meds: Current Facility-Administered Medications  Medication Dose Route Frequency  Provider Last Rate Last Dose  . acetaminophen (TYLENOL) tablet 650 mg  650 mg Oral Q6H PRN Milagros Loll, MD       Current Outpatient Prescriptions  Medication Sig Dispense Refill  . aspirin 325 MG tablet Take 650 mg by mouth daily.    . cholecalciferol (VITAMIN D) 1000 units tablet Take 1,000 Units by mouth daily.    . Cyanocobalamin (VITAMIN B 12 PO) Take 1 tablet by mouth daily.    Marland Kitchen escitalopram (LEXAPRO) 10 MG tablet Take 10 mg by mouth daily.    Marland Kitchen olmesartan-hydrochlorothiazide (BENICAR HCT) 40-25 MG tablet Take 1 tablet by mouth daily.    Marland Kitchen omeprazole (PRILOSEC) 40 MG capsule Take 40 mg by mouth daily.      Allergies: Allergies as of 02/26/2016 - Review Complete 02/26/2016  Allergen Reaction Noted  . Augmentin [amoxicillin-pot clavulanate]  02/26/2016   Past Medical History  Diagnosis Date  . HTN (hypertension)   . Hyperlipidemia   . GERD (gastroesophageal reflux disease)    No past surgical history on file. Family History  Problem Relation Age of Onset  . Hypertension Father   . Hypertension Mother   . Hyperlipidemia Mother   . Hyperlipidemia Father    Social History   Social History  . Marital Status: Married  Spouse Name: N/A  . Number of Children: N/A  . Years of Education: N/A   Occupational History  . Not on file.   Social History Main Topics  . Smoking status: Not on file  . Smokeless tobacco: Not on file  . Alcohol Use: Not on file  . Drug Use: Not on file  . Sexual Activity: Not on file   Other Topics Concern  . Not on file   Social History Narrative  . No narrative on file    Review of Systems: Pertinent items are noted in HPI.  Physical Exam: Blood pressure 160/48, pulse 63, temperature 98 F (36.7 C), resp. rate 22, SpO2 96 %. General appearance: alert, cooperative, appears stated age, no distress and lying upright in bed Head: Normocephalic, without obvious abnormality, atraumatic, sinuses nontender to percussion Eyes:  conjunctiva clear, moist mucous membranes Throat: lips, mucosa, and tongue normal; teeth and gums normal Lungs: normal respiratory effort, clear to auscultation bilaterally  Chest wall: no tenderness to palpation over the precordium Heart: regular rate and rhythm, S1, S2 normal, no murmur, click, rub or gallop Abdomen: soft, nontender, nondistended, normal bowel sounds, no tenderness to palpation Extremities: soft, atraumatic, no evidence of pedal edema Neurologic: Mental status: Alert, oriented, thought content appropriate Cranial nerves: II: visual acuity normal bilaterally, II: visual field normal, III,IV,VI: extraocular muscles extra-ocular motions intact, VII: upper facial muscle function normal bilaterally, VII: lower facial muscle function normal bilaterally, VIII: hearing unable to perform, XII: tongue strength normal  Sensory: sensation intact grossly throughout all extremities Motor: strength 5/5 in all muscle groups bilaterally Reflexes: 2+ and symmetric throughout   Lab results: Basic Metabolic Panel:  Recent Labs  02/26/16 1315 02/26/16 1320  NA 140 141  K 4.4 4.4  CL 103 106  CO2  --  26  GLUCOSE 102* 107*  BUN 14 12  CREATININE 0.70 0.74  CALCIUM  --  9.1   Liver Function Tests:  Recent Labs  02/26/16 1320  AST 34  ALT 30  ALKPHOS 66  BILITOT 0.9  PROT 6.8  ALBUMIN 3.2*   CBC:  Recent Labs  02/26/16 1315 02/26/16 1320  WBC  --  7.7  NEUTROABS  --  4.8  HGB 12.2 10.7*  HCT 36.0 34.8*  MCV  --  87.9  PLT  --  287   Coagulation:  Recent Labs  02/26/16 1320  LABPROT 14.4  INR 1.10   Urine Drug Screen: Drugs of Abuse     Component Value Date/Time   LABOPIA NONE DETECTED 02/26/2016 1338   COCAINSCRNUR NONE DETECTED 02/26/2016 1338   LABBENZ NONE DETECTED 02/26/2016 1338   AMPHETMU NONE DETECTED 02/26/2016 1338   THCU NONE DETECTED 02/26/2016 1338   LABBARB NONE DETECTED 02/26/2016 1338    Alcohol Level:  Recent Labs  02/26/16 1321   ETH <5   Urinalysis:  Recent Labs  02/26/16 1338  COLORURINE YELLOW  LABSPEC <1.005*  PHURINE 6.5  GLUCOSEU NEGATIVE  HGBUR NEGATIVE  BILIRUBINUR NEGATIVE  KETONESUR NEGATIVE  PROTEINUR NEGATIVE  NITRITE NEGATIVE  LEUKOCYTESUR NEGATIVE    Imaging results:  Ct Head Wo Contrast  02/26/2016  CLINICAL DATA:  70 year old female with a history of hypertension. Transient weakness and question of TIA EXAM: CT HEAD WITHOUT CONTRAST TECHNIQUE: Contiguous axial images were obtained from the base of the skull through the vertex without intravenous contrast. COMPARISON:  None. FINDINGS: Unremarkable appearance of the calvarium without acute fracture or aggressive lesion. Unremarkable appearance of the scalp soft  tissues. Unremarkable appearance of the bilateral orbits. Mastoid air cells are clear. No significant paranasal sinus disease No acute intracranial hemorrhage, midline shift, or mass effect. Gray-white differentiation is maintained, without CT evidence of acute ischemia. Unremarkable configuration of the ventricles. IMPRESSION: No CT evidence of acute intracranial abnormality. Signed, Dulcy Fanny. Earleen Newport, DO [facility] Electronically Signed   By: Corrie Mckusick D.O.   On: 02/26/2016 13:30    Other results: EKG: sinus rhythm with possible left atrial enlargement. No evidence of ischemia  Assessment & Plan by Problem: Active Problems:   TIA (transient ischemic attack)   [name] Collier is a 70 y.o. Female who presents with 1 minute episode of inability to speak or move left arm  TIA. Patient most likely had TIA given risk factor of htn and the episode was unlikely to be a seizure given no post-ictal state and no bowel or bladder incontinence. Syncope unlikely given patient did not experience loss of consciousness.  -neuro following, appreciate recs -ASA 325 mg daily -continuous cardiac monitoring -MRI/MRA brain without  contrast -Echo -HBA1c and fasting lipid panel -PT/OT   HTN. Will allow permissive htn for 48 hours -hold home benicar 40/25 daily  GERD -continue omeprazole 40mg  daily  Anxiety -continue lexapro  FEN/GI -NPO  VTE PPx: Lovenox  Code. Patient would not like resuscitative measures  Dispo. Patient will be admitted pending workup and no recurrence of symptoms. Anticipated discharge in 1-2 days.  This is a Careers information officer Note.  The care of the patient was discussed with Dr. Randell Patient and the assessment and plan was formulated with their assistance.  Please see their note for official documentation of the patient encounter.   Signed: [name], Med Student 02/26/2016, 4:47 PM

## 2016-02-26 NOTE — ED Provider Notes (Signed)
CSN: [id number]     Arrival date & time 02/26/16  1202 History   First MD Initiated Contact with Patient 02/26/16 1225     Chief Complaint  Patient presents with  . Transient Ischemic Attack     (Consider location/radiation/quality/duration/timing/severity/associated sxs/prior Treatment) HPI Comments: Patient is a 70 year old female patient who presents after an episode of slurred speech and left arm weakness. She states that 9:30 this morning she heard the phone ringing and noticed that she couldn't move her left arm to pick up the phone. She used her right arm to pick up the phone and noted that she couldn't talk. She knew that she went to say hello but she couldn't get her words out. She states shortly after that she was able to say hello but it was slurred. Just prior to this incident, she was normal and can move all extremities normally. She states this lasted about 1-2 minutes. Following that she was able to move all extremities normally and speak normally. She drove to her PCP who called EMS and she was transported here. She's had no further symptoms. She has no past history of stroke. She has no known history of atrial fibrillation although she states she's had 2 episodes over the last week of feeling like her heart racing.   No past medical history on file. No past surgical history on file. No family history on file. Social History  Substance Use Topics  . Smoking status: Not on file  . Smokeless tobacco: Not on file  . Alcohol Use: Not on file   OB History    No data available     Review of Systems  Constitutional: Negative for fever, chills, diaphoresis and fatigue.  HENT: Negative for congestion, rhinorrhea and sneezing.   Eyes: Negative.   Respiratory: Negative for cough, chest tightness and shortness of breath.   Cardiovascular: Negative for chest pain and leg swelling.  Gastrointestinal: Negative for nausea, vomiting, abdominal pain, diarrhea and blood in stool.   Genitourinary: Negative for frequency, hematuria, flank pain and difficulty urinating.  Musculoskeletal: Negative for back pain and arthralgias.  Skin: Negative for rash.  Neurological: Positive for speech difficulty, weakness and numbness. Negative for dizziness and headaches.      Allergies  Augmentin  Home Medications   Prior to Admission medications   Medication Sig Start Date End Date Taking? Authorizing Provider  aspirin 325 MG tablet Take 650 mg by mouth daily.   Yes Historical Provider, MD  cholecalciferol (VITAMIN D) 1000 units tablet Take 1,000 Units by mouth daily.   Yes Historical Provider, MD  Cyanocobalamin (VITAMIN B 12 PO) Take 1 tablet by mouth daily.   Yes Historical Provider, MD  escitalopram (LEXAPRO) 10 MG tablet Take 10 mg by mouth daily.   Yes Historical Provider, MD  olmesartan-hydrochlorothiazide (BENICAR HCT) 40-25 MG tablet Take 1 tablet by mouth daily.   Yes Historical Provider, MD  omeprazole (PRILOSEC) 40 MG capsule Take 40 mg by mouth daily.   Yes Historical Provider, MD   BP 141/49 mmHg  Pulse 66  Temp(Src) 98 F (36.7 C)  Resp 22  SpO2 95% Physical Exam  Constitutional: She is oriented to person, place, and time. She appears well-developed and well-nourished.  HENT:  Head: Normocephalic and atraumatic.  Eyes: Pupils are equal, round, and reactive to light.  Neck: Normal range of motion. Neck supple.  Cardiovascular: Normal rate, regular rhythm and normal heart sounds.   Pulmonary/Chest: Effort normal and breath sounds normal. No respiratory  distress. She has no wheezes. She has no rales. She exhibits no tenderness.  Abdominal: Soft. Bowel sounds are normal. There is no tenderness. There is no rebound and no guarding.  Musculoskeletal: Normal range of motion. She exhibits no edema.  Lymphadenopathy:    She has no cervical adenopathy.  Neurological: She is alert and oriented to person, place, and time. She has normal strength. No cranial nerve  deficit or sensory deficit. GCS eye subscore is 4. GCS verbal subscore is 5. GCS motor subscore is 6.  FTN intact, no pronator drift  Skin: Skin is warm and dry. No rash noted.  Psychiatric: She has a normal mood and affect.    ED Course  Procedures (including critical care time) Labs Review Labs Reviewed  CBC - Abnormal; Notable for the following:    Hemoglobin 10.7 (*)    HCT 34.8 (*)    All other components within normal limits  COMPREHENSIVE METABOLIC PANEL - Abnormal; Notable for the following:    Glucose, Bld 107 (*)    Albumin 3.2 (*)    All other components within normal limits  URINALYSIS, ROUTINE W REFLEX MICROSCOPIC (NOT AT [facility]) - Abnormal; Notable for the following:    Specific Gravity, Urine <1.005 (*)    All other components within normal limits  I-STAT CHEM 8, ED - Abnormal; Notable for the following:    Glucose, Bld 102 (*)    Calcium, Ion 1.09 (*)    All other components within normal limits  ETHANOL  PROTIME-INR  APTT  DIFFERENTIAL  URINE RAPID DRUG SCREEN, HOSP PERFORMED  I-STAT TROPOININ, ED    Imaging Review Ct Head Wo Contrast  02/26/2016  CLINICAL DATA:  70 year old female with a history of hypertension. Transient weakness and question of TIA EXAM: CT HEAD WITHOUT CONTRAST TECHNIQUE: Contiguous axial images were obtained from the base of the skull through the vertex without intravenous contrast. COMPARISON:  None. FINDINGS: Unremarkable appearance of the calvarium without acute fracture or aggressive lesion. Unremarkable appearance of the scalp soft tissues. Unremarkable appearance of the bilateral orbits. Mastoid air cells are clear. No significant paranasal sinus disease No acute intracranial hemorrhage, midline shift, or mass effect. Gray-white differentiation is maintained, without CT evidence of acute ischemia. Unremarkable configuration of the ventricles. IMPRESSION: No CT evidence of acute intracranial abnormality. Signed, Dulcy Fanny. Earleen Newport, DO [facility] Radiology Electronically Signed   By: Corrie Mckusick D.O.   On: 02/26/2016 13:30   I have personally reviewed and evaluated these images and lab results as part of my medical decision-making.   EKG Interpretation   Date/Time:  Wednesday February 26 2016 12:54:57 EDT Ventricular Rate:  78 PR Interval:  196 QRS Duration: 116 QT Interval:  384 QTC Calculation: 437 R Axis:   14 Text Interpretation:  Sinus rhythm Probable left atrial enlargement  Nonspecific intraventricular conduction delay Low voltage, precordial  leads since last tracing no significant change Confirmed by Pranav Lince  MD,  Kenadi Miltner ([id number]) on 02/26/2016 1:52:20 PM      MDM   Final diagnoses:  Transient cerebral ischemia, unspecified transient cerebral ischemia type    Patient presents after having symptoms that sound consistent with a TIA. She is neurologically intact now. Her head CT is negative. Her EKG shows a sinus rhythm. There is no known history of atrial fibrillation. I spoke with Dr.Lindzen, neurohospitalist, who will consult on the patient. I spoke with the resident with the internal medicine teaching service who will admit the  patient to an obs bed, telemetry.    Malvin Johns, MD 02/26/16 [phone]

## 2016-02-26 NOTE — ED Notes (Signed)
Pt reported while talking on Telephone her speech was slurred and Pt had RUE weakness . Pt then drove to her PCP - Dione Housekeeper . Staff called EMS to transport PT to ED. All symptoms resolved prior to arrival th ED. PT AO and speaking in full sentences.

## 2016-02-26 NOTE — Progress Notes (Signed)
Mykeia Wingerter [id number] Admission Data: 02/26/2016 6:15 PM Attending Provider: Annia Belt, MD  PCP:No primary care provider on file. Consults/ Treatment Team:    Geniece Cuppett is a 70 y.o. female patient admitted from ED awake, alert  & orientated  X 3,  DNR, VSS - Blood pressure 190/72, pulse 67, temperature 98.3 F (36.8 C), temperature source Oral, resp. rate 18, height 5\' 6"  (1.676 m), weight 131.3 kg (289 lb 7.4 oz), SpO2 96 %.,  no c/o shortness of breath, no c/o chest pain, no distress noted. Tele # 11 placed.   IV site WDL: SL at this time.   Allergies:   Allergies  Allergen Reactions  . Augmentin [Amoxicillin-Pot Clavulanate]     c-diff     Past Medical History  Diagnosis Date  . HTN (hypertension)   . Hyperlipidemia   . GERD (gastroesophageal reflux disease)     Pt orientation to unit, room and routine. Information packet given to patient/family and safety video watched.  Admission INP armband ID verified with patient/family, and in place. SR up x 2, fall risk assessment complete with Patient and family verbalizing understanding of risks associated with falls. Pt verbalizes an understanding of how to use the call bell and to call for help before getting out of bed.  Skin, clean-dry- intact some bruising noted to left arm and no skin tears.   No evidence of skin break down noted on exam.     Will cont to monitor and assist as needed.  Dayle Points, RN 02/26/2016 6:15 PM

## 2016-02-27 ENCOUNTER — Encounter (HOSPITAL_COMMUNITY): Payer: Self-pay | Admitting: Nurse Practitioner

## 2016-02-27 ENCOUNTER — Observation Stay (HOSPITAL_COMMUNITY): Payer: Medicare Other

## 2016-02-27 ENCOUNTER — Observation Stay (HOSPITAL_BASED_OUTPATIENT_CLINIC_OR_DEPARTMENT_OTHER): Payer: Medicare Other

## 2016-02-27 DIAGNOSIS — G459 Transient cerebral ischemic attack, unspecified: Secondary | ICD-10-CM | POA: Diagnosis not present

## 2016-02-27 DIAGNOSIS — D649 Anemia, unspecified: Secondary | ICD-10-CM | POA: Diagnosis not present

## 2016-02-27 DIAGNOSIS — K219 Gastro-esophageal reflux disease without esophagitis: Secondary | ICD-10-CM

## 2016-02-27 DIAGNOSIS — I10 Essential (primary) hypertension: Secondary | ICD-10-CM | POA: Diagnosis not present

## 2016-02-27 DIAGNOSIS — F419 Anxiety disorder, unspecified: Secondary | ICD-10-CM

## 2016-02-27 LAB — LIPID PANEL
Cholesterol: 202 mg/dL — ABNORMAL HIGH (ref 0–200)
HDL: 44 mg/dL (ref >40)
LDL CALC: 139 mg/dL — AB (ref 0–99)
TRIGLYCERIDES: 97 mg/dL (ref <150)
Total CHOL/HDL Ratio: 4.6 RATIO
VLDL: 19 mg/dL (ref 0–40)

## 2016-02-27 LAB — ECHOCARDIOGRAM COMPLETE
Height: 66 in
Weight: 4631.42 oz

## 2016-02-27 LAB — TSH: TSH: 4.001 u[IU]/mL (ref 0.350–4.500)

## 2016-02-27 MED ORDER — FAMOTIDINE 20 MG PO TABS
20.0000 mg | ORAL_TABLET | Freq: Two times a day (BID) | ORAL | Status: DC
Start: 2016-02-27 — End: 2017-11-11

## 2016-02-27 MED ORDER — FAMOTIDINE 20 MG PO TABS
20.0000 mg | ORAL_TABLET | Freq: Two times a day (BID) | ORAL | Status: DC
  Administered 2016-02-27: 20 mg via ORAL
  Filled 2016-02-27: qty 1

## 2016-02-27 MED ORDER — ROSUVASTATIN CALCIUM 20 MG PO TABS: 20.0000 mg | ORAL_TABLET | ORAL | Status: DC

## 2016-02-27 MED ORDER — ASPIRIN 325 MG PO TABS: 325.0000 mg | ORAL_TABLET | Freq: Every day | ORAL | Status: DC

## 2016-02-27 MED ORDER — STUDY - INVESTIGATIONAL DRUG SIMPLE RECORD: 75.0000 mg | Freq: Every day | Status: DC

## 2016-02-27 MED ORDER — HYDROCHLOROTHIAZIDE 25 MG PO TABS
25.0000 mg | ORAL_TABLET | Freq: Every day | ORAL | Status: DC
  Administered 2016-02-27: 25 mg via ORAL
  Filled 2016-02-27: qty 1

## 2016-02-27 NOTE — Progress Notes (Signed)
Subjective: [name]. [name] Collier did well overnight with no acute complaints. Denies chest pain or further episodes of feeling her heart racing.   Objective: Vital signs in last 24 hours: Filed Vitals:   02/26/16 1753 02/26/16 2121 02/27/16 0159 02/27/16 0542  BP: 190/72 188/87 131/64 150/48  Pulse: 67 66 73 72  Temp: 98.3 F (36.8 C) 98.2 F (36.8 C) 98.3 F (36.8 C) 98.8 F (37.1 C)  TempSrc: Oral Oral Oral Oral  Resp: 18 18 18 18   Height: 5\' 6"  (1.676 m)     Weight: 131.3 kg (289 lb 7.4 oz)     SpO2: 96% 98% 96% 95%   Weight change:   Intake/Output Summary (Last 24 hours) at 02/27/16 0941 Last data filed at 02/27/16 0648  Gross per 24 hour  Intake    460 ml  Output      0 ml  Net    460 ml   BP 150/48 mmHg  Pulse 72  Temp(Src) 98.8 F (37.1 C) (Oral)  Resp 18  Ht 5\' 6"  (1.676 m)  Wt 131.3 kg (289 lb 7.4 oz)  BMI 46.74 kg/m2  SpO2 95% General appearance: alert, cooperative, appears stated age, no distress and sitting in chair Head: Normocephalic, without obvious abnormality, atraumatic Lungs: normal respiratory effort, clear to auscultation bilaterally  Chest wall: no tenderness to palpation over the precordium Heart: regular rate and rhythm, S1, S2 normal, no murmur, click, rub or gallop Extremities: soft, atraumatic, no evidence of pedal edema Neurologic: Mental status: Alert, oriented, thought content appropriate Sensory: sensation intact grossly throughout all extremities Motor: strength 5/5 in all muscle groups bilaterally Reflexes: 2+ and symmetric throughout  Lab Results: Basic Metabolic Panel:  Recent Labs  02/26/16 1315 02/26/16 1320  NA 140 141  K 4.4 4.4  CL 103 106  CO2  --  26  GLUCOSE 102* 107*  BUN 14 12  CREATININE 0.70 0.74  CALCIUM  --  9.1   Liver Function Tests:  Recent Labs  02/26/16 1320  AST 34  ALT 30  ALKPHOS 66  BILITOT 0.9  PROT 6.8  ALBUMIN 3.2*   CBC:  Recent Labs  02/26/16 1315 02/26/16 1320  WBC  --  7.7    NEUTROABS  --  4.8  HGB 12.2 10.7*  HCT 36.0 34.8*  MCV  --  87.9  PLT  --  287   Fasting Lipid Panel:  Recent Labs  02/27/16 0519  CHOL 202*  HDL 44  LDLCALC 139*  TRIG 97  CHOLHDL 4.6   Thyroid Function Tests:  Recent Labs  02/27/16 0742  TSH 4.001   Coagulation:  Recent Labs  02/26/16 1320  LABPROT 14.4  INR 1.10   Urine Drug Screen: Drugs of Abuse     Component Value Date/Time   LABOPIA NONE DETECTED 02/26/2016 1338   COCAINSCRNUR NONE DETECTED 02/26/2016 1338   LABBENZ NONE DETECTED 02/26/2016 1338   AMPHETMU NONE DETECTED 02/26/2016 1338   THCU NONE DETECTED 02/26/2016 1338   LABBARB NONE DETECTED 02/26/2016 1338    Alcohol Level:  Recent Labs  02/26/16 1321  ETH <5   Urinalysis:  Recent Labs  02/26/16 1338  COLORURINE YELLOW  LABSPEC <1.005*  PHURINE 6.5  GLUCOSEU NEGATIVE  HGBUR NEGATIVE  BILIRUBINUR NEGATIVE  KETONESUR NEGATIVE  PROTEINUR NEGATIVE  NITRITE NEGATIVE  LEUKOCYTESUR NEGATIVE   Studies/Results: Ct Head Wo Contrast  02/26/2016  CLINICAL DATA:  70 year old female with a history of hypertension. Transient weakness and question of TIA EXAM:  CT HEAD WITHOUT CONTRAST TECHNIQUE: Contiguous axial images were obtained from the base of the skull through the vertex without intravenous contrast. COMPARISON:  None. FINDINGS: Unremarkable appearance of the calvarium without acute fracture or aggressive lesion. Unremarkable appearance of the scalp soft tissues. Unremarkable appearance of the bilateral orbits. Mastoid air cells are clear. No significant paranasal sinus disease No acute intracranial hemorrhage, midline shift, or mass effect. Gray-white differentiation is maintained, without CT evidence of acute ischemia. Unremarkable configuration of the ventricles. IMPRESSION: No CT evidence of acute intracranial abnormality. Signed, Dulcy Fanny. Earleen Newport, DO [facility] Radiology Electronically  Signed   By: Corrie Mckusick D.O.   On: 02/26/2016 13:30   Mr Brain Wo Contrast  02/26/2016  CLINICAL DATA:  Episode at 9:30 this morning of left arm weakness and slurred speech lasting 1-2 minutes. Initial encounter. EXAM: MRI HEAD WITHOUT CONTRAST MRA HEAD WITHOUT CONTRAST TECHNIQUE: Multiplanar, multiecho pulse sequences of the brain and surrounding structures were obtained without intravenous contrast. Angiographic images of the head were obtained using MRA technique without contrast. COMPARISON:  CT head without contrast 02/26/2016. FINDINGS: MRI HEAD FINDINGS The diffusion-weighted images demonstrate no evidence for acute or subacute infarction. Mild periventricular and scattered subcortical T2 changes bilaterally are somewhat advanced for age. No acute hemorrhage or mass lesion is present. The ventricles are of normal size. No significant extra-axial fluid collection is present. The brainstem and cerebellum are within normal limits. The internal auditory canals are normal bilaterally. Flow is present in the major intracranial arteries. Bilateral lens replacements are present. The globes and orbits are intact. The paranasal sinuses and mastoid air cells are clear. MRA HEAD FINDINGS Internal carotid arteries are within normal limits from the high cervical through the ICA termini bilaterally. The A1 and M1 segments are normal. The MCA bifurcations are intact. The anterior communicating artery is patent. ACA and MCA branch vessels are within normal limits. The right vertebral artery is the dominant vessel. The right PICA origin is visualized and normal. Both AICA vessels are intact. A fetal type left posterior cerebral artery is present. There is a small left P1 segment. The PCA branch vessels are normal. IMPRESSION: 1. No acute intracranial abnormality. 2. Mild periventricular and scattered subcortical T2 changes bilaterally are somewhat advanced for age. 3. Normal variant MRA circle of Willis without evidence  for significant proximal stenosis, aneurysm, or branch vessel occlusion. Electronically Signed   By: San Morelle M.D.   On: 02/26/2016 19:40   Mr Jodene Nam Head/brain Wo Cm  02/26/2016  CLINICAL DATA:  Episode at 9:30 this morning of left arm weakness and slurred speech lasting 1-2 minutes. Initial encounter. EXAM: MRI HEAD WITHOUT CONTRAST MRA HEAD WITHOUT CONTRAST TECHNIQUE: Multiplanar, multiecho pulse sequences of the brain and surrounding structures were obtained without intravenous contrast. Angiographic images of the head were obtained using MRA technique without contrast. COMPARISON:  CT head without contrast 02/26/2016. FINDINGS: MRI HEAD FINDINGS The diffusion-weighted images demonstrate no evidence for acute or subacute infarction. Mild periventricular and scattered subcortical T2 changes bilaterally are somewhat advanced for age. No acute hemorrhage or mass lesion is present. The ventricles are of normal size. No significant extra-axial fluid collection is present. The brainstem and cerebellum are within normal limits. The internal auditory canals are normal bilaterally. Flow is present in the major intracranial arteries. Bilateral lens replacements are present. The globes and orbits are intact. The paranasal sinuses and mastoid air cells are clear. MRA HEAD FINDINGS Internal carotid arteries are within  normal limits from the high cervical through the ICA termini bilaterally. The A1 and M1 segments are normal. The MCA bifurcations are intact. The anterior communicating artery is patent. ACA and MCA branch vessels are within normal limits. The right vertebral artery is the dominant vessel. The right PICA origin is visualized and normal. Both AICA vessels are intact. A fetal type left posterior cerebral artery is present. There is a small left P1 segment. The PCA branch vessels are normal. IMPRESSION: 1. No acute intracranial abnormality. 2. Mild periventricular and scattered subcortical T2 changes  bilaterally are somewhat advanced for age. 3. Normal variant MRA circle of Willis without evidence for significant proximal stenosis, aneurysm, or branch vessel occlusion. Electronically Signed   By: San Morelle M.D.   On: 02/26/2016 19:40   Medications: I have reviewed the patient's current medications. Scheduled Meds: .  stroke: mapping our early stages of recovery book   Does not apply Once  . aspirin  325 mg Oral Daily  . cholecalciferol  1,000 Units Oral Daily  . escitalopram  10 mg Oral Daily  . hydrochlorothiazide  25 mg Oral Daily  . pantoprazole  80 mg Oral Daily  . research study medication  75 mg Oral Q breakfast   Continuous Infusions:  PRN Meds:.acetaminophen, senna-docusate Assessment/Plan: Active Problems:   TIA (transient ischemic attack)  [name] Collier is a 70 y.o. Female who presents with 1 minute episode of inability to speak or move left arm  TIA. MRI showed no acute intracranial infarct or hemorrhage. MRA showed no stenosis of internal carotid or major cerebral vessels. Awaiting carotid doppler and echo results. Lipid panel shows elevated cholesterol to 202, LDL 139, and trigs 97.  -ASA 325 mg daily -Crestor every other day -continuous cardiac monitoring -f/u Echo -f/u HBA1c  -PT/OT  HTN. Will allow permissive htn for 48 hours -hold home benicar 40/25 daily  GERD -continue omeprazole 40mg  daily  Anxiety -continue lexapro  FEN/GI -NPO  VTE PPx: Lovenox  Code. Patient would not like resuscitative measures  Dispo. Patient will be admitted pending workup and no recurrence of symptoms. Anticipated discharge in 1-2 days.  This is a Careers information officer Note.  The care of the patient was discussed with Dr. Randell Patient and the assessment and plan formulated with their assistance.  Please see their attached note for official documentation of the daily encounter.     [name], Med Student 02/27/2016, 9:41 AM

## 2016-02-27 NOTE — Progress Notes (Signed)
Subjective: No acute events overnight. Doing well this morning. Denies weakness or paresthesias.  Objective: Vital signs in last 24 hours: Filed Vitals:   02/26/16 1753 02/26/16 2121 02/27/16 0159 02/27/16 0542  BP: 190/72 188/87 131/64 150/48  Pulse: 67 66 73 72  Temp: 98.3 F (36.8 C) 98.2 F (36.8 C) 98.3 F (36.8 C) 98.8 F (37.1 C)  TempSrc: Oral Oral Oral Oral  Resp: 18 18 18 18   Height: 5\' 6"  (1.676 m)     Weight: 289 lb 7.4 oz (131.3 kg)     SpO2: 96% 98% 96% 95%   Weight change:   Intake/Output Summary (Last 24 hours) at 02/27/16 0717 Last data filed at 02/27/16 [id number]  Gross per 24 hour  Intake    460 ml  Output      0 ml  Net    460 ml   General Apperance: NAD HEENT: Normocephalic, atraumatic, anicteric sclera Neck: Supple, trachea midline Lungs: Clear to auscultation bilaterally. No wheezes, rhonchi or rales. Breathing comfortably Heart: Regular rate and rhythm, no murmur/rub/gallop Abdomen: Soft, nontender, nondistended, no rebound/guarding Extremities: Warm and well perfused, no edema Skin: No rashes or lesions Neurologic: Alert and interactive. No gross deficits.  Lab Results: Basic Metabolic Panel:  Recent Labs Lab 02/26/16 1315 02/26/16 1320  NA 140 141  K 4.4 4.4  CL 103 106  CO2  --  26  GLUCOSE 102* 107*  BUN 14 12  CREATININE 0.70 0.74  CALCIUM  --  9.1   Liver Function Tests:  Recent Labs Lab 02/26/16 1320  AST 34  ALT 30  ALKPHOS 66  BILITOT 0.9  PROT 6.8  ALBUMIN 3.2*   CBC:  Recent Labs Lab 02/26/16 1315 02/26/16 1320  WBC  --  7.7  NEUTROABS  --  4.8  HGB 12.2 10.7*  HCT 36.0 34.8*  MCV  --  87.9  PLT  --  287   Coagulation:  Recent Labs Lab 02/26/16 1320  LABPROT 14.4  INR 1.10   Urine Drug Screen: Drugs of Abuse     Component Value Date/Time   LABOPIA NONE DETECTED 02/26/2016 1338   COCAINSCRNUR NONE DETECTED 02/26/2016 1338   LABBENZ NONE DETECTED 02/26/2016 1338   AMPHETMU NONE DETECTED  02/26/2016 1338   THCU NONE DETECTED 02/26/2016 1338   LABBARB NONE DETECTED 02/26/2016 1338    Alcohol Level:  Recent Labs Lab 02/26/16 1321  ETH <5   Urinalysis:  Recent Labs Lab 02/26/16 1338  COLORURINE YELLOW  LABSPEC <1.005*  PHURINE 6.5  GLUCOSEU NEGATIVE  HGBUR NEGATIVE  BILIRUBINUR NEGATIVE  KETONESUR NEGATIVE  PROTEINUR NEGATIVE  NITRITE NEGATIVE  LEUKOCYTESUR NEGATIVE   Studies/Results: Ct Head Wo Contrast  02/26/2016  CLINICAL DATA:  70 year old female with a history of hypertension. Transient weakness and question of TIA EXAM: CT HEAD WITHOUT CONTRAST TECHNIQUE: Contiguous axial images were obtained from the base of the skull through the vertex without intravenous contrast. COMPARISON:  None. FINDINGS: Unremarkable appearance of the calvarium without acute fracture or aggressive lesion. Unremarkable appearance of the scalp soft tissues. Unremarkable appearance of the bilateral orbits. Mastoid air cells are clear. No significant paranasal sinus disease No acute intracranial hemorrhage, midline shift, or mass effect. Gray-white differentiation is maintained, without CT evidence of acute ischemia. Unremarkable configuration of the ventricles. IMPRESSION: No CT evidence of acute intracranial abnormality. Signed, Dulcy Fanny. Earleen Newport, DO [facility] Electronically Signed   By: Corrie Mckusick D.O.   On: 02/26/2016  13:30   Mr Brain Wo Contrast  02/26/2016  CLINICAL DATA:  Episode at 9:30 this morning of left arm weakness and slurred speech lasting 1-2 minutes. Initial encounter. EXAM: MRI HEAD WITHOUT CONTRAST MRA HEAD WITHOUT CONTRAST TECHNIQUE: Multiplanar, multiecho pulse sequences of the brain and surrounding structures were obtained without intravenous contrast. Angiographic images of the head were obtained using MRA technique without contrast. COMPARISON:  CT head without contrast 02/26/2016. FINDINGS: MRI HEAD FINDINGS  The diffusion-weighted images demonstrate no evidence for acute or subacute infarction. Mild periventricular and scattered subcortical T2 changes bilaterally are somewhat advanced for age. No acute hemorrhage or mass lesion is present. The ventricles are of normal size. No significant extra-axial fluid collection is present. The brainstem and cerebellum are within normal limits. The internal auditory canals are normal bilaterally. Flow is present in the major intracranial arteries. Bilateral lens replacements are present. The globes and orbits are intact. The paranasal sinuses and mastoid air cells are clear. MRA HEAD FINDINGS Internal carotid arteries are within normal limits from the high cervical through the ICA termini bilaterally. The A1 and M1 segments are normal. The MCA bifurcations are intact. The anterior communicating artery is patent. ACA and MCA branch vessels are within normal limits. The right vertebral artery is the dominant vessel. The right PICA origin is visualized and normal. Both AICA vessels are intact. A fetal type left posterior cerebral artery is present. There is a small left P1 segment. The PCA branch vessels are normal. IMPRESSION: 1. No acute intracranial abnormality. 2. Mild periventricular and scattered subcortical T2 changes bilaterally are somewhat advanced for age. 3. Normal variant MRA circle of Willis without evidence for significant proximal stenosis, aneurysm, or branch vessel occlusion. Electronically Signed   By: San Morelle M.D.   On: 02/26/2016 19:40   Mr Jodene Nam Head/brain Wo Cm  02/26/2016  CLINICAL DATA:  Episode at 9:30 this morning of left arm weakness and slurred speech lasting 1-2 minutes. Initial encounter. EXAM: MRI HEAD WITHOUT CONTRAST MRA HEAD WITHOUT CONTRAST TECHNIQUE: Multiplanar, multiecho pulse sequences of the brain and surrounding structures were obtained without intravenous contrast. Angiographic images of the head were obtained using MRA  technique without contrast. COMPARISON:  CT head without contrast 02/26/2016. FINDINGS: MRI HEAD FINDINGS The diffusion-weighted images demonstrate no evidence for acute or subacute infarction. Mild periventricular and scattered subcortical T2 changes bilaterally are somewhat advanced for age. No acute hemorrhage or mass lesion is present. The ventricles are of normal size. No significant extra-axial fluid collection is present. The brainstem and cerebellum are within normal limits. The internal auditory canals are normal bilaterally. Flow is present in the major intracranial arteries. Bilateral lens replacements are present. The globes and orbits are intact. The paranasal sinuses and mastoid air cells are clear. MRA HEAD FINDINGS Internal carotid arteries are within normal limits from the high cervical through the ICA termini bilaterally. The A1 and M1 segments are normal. The MCA bifurcations are intact. The anterior communicating artery is patent. ACA and MCA branch vessels are within normal limits. The right vertebral artery is the dominant vessel. The right PICA origin is visualized and normal. Both AICA vessels are intact. A fetal type left posterior cerebral artery is present. There is a small left P1 segment. The PCA branch vessels are normal. IMPRESSION: 1. No acute intracranial abnormality. 2. Mild periventricular and scattered subcortical T2 changes bilaterally are somewhat advanced for age. 3. Normal variant MRA circle of Willis without evidence for significant proximal stenosis, aneurysm, or branch  vessel occlusion. Electronically Signed   By: San Morelle M.D.   On: 02/26/2016 19:40   Medications: I have reviewed the patient's current medications. Scheduled Meds: .  stroke: mapping our early stages of recovery book   Does not apply Once  . aspirin  325 mg Oral Daily  . cholecalciferol  1,000 Units Oral Daily  . escitalopram  10 mg Oral Daily  . pantoprazole  80 mg Oral Daily  .  research study medication  75 mg Oral Q breakfast   Continuous Infusions:  PRN Meds:.acetaminophen, senna-docusate Assessment/Plan: 70 year old woman with history of GERD, HTN, anxiety presenting with 1 minute episode of slurred speech and left arm weakness.  TIA: CT head demonstrates no acute intracranial abnormality. MRI/MRA head with no acute intracranial abnormality. Total cholesterol 202, LDL 139, HDL 44. -Carotid dopplers pending -Echo pending -HgbA1c pending -PT/OT -Continue ASA 325mg  daily -Start Crestor 20mg  every other day (history of myalgias with statins in past). If she is unable to tolerate this, would consider pravastatin every other day.  HTN: Continue home HCTZ 25mg  daily.  Anxiety: Cont home Lexapro 10mg  daily.  GERD: Cont home omeprazole as pantoprazole.  Code: Discussed code status at length with patient. She would like to be FULL code for now.   Dispo: Disposition is deferred at this time, awaiting improvement of current medical problems.  Anticipated discharge in approximately 0-1 day(s).   The patient does have a current PCP (No primary care provider on file.) and does not need an [facility] hospital follow-up appointment after discharge.  The patient does not have transportation limitations that hinder transportation to clinic appointments.  .Services Needed at time of discharge: Y = Yes, Blank = No PT:   OT:   RN:   Equipment:   Other:       Milagros Loll, MD 02/27/2016, 7:17 AM

## 2016-02-27 NOTE — Care Management Obs Status (Signed)
[facility] NOTIFICATION   Patient Details  Name: [name] Collier MRN: [id number] Date of Birth: 1946/09/11   Medicare Observation Status Notification Given:  Yes    CrutchfieldAntony Haste, RN 02/27/2016, 12:09 PM

## 2016-02-27 NOTE — Evaluation (Signed)
Occupational Therapy Evaluation Patient Details Name: [name] Collier MRN: [id number] DOB: June 03, 1946 Today's Date: 02/27/2016    History of Present Illness Pt admitted from her MD's office after experiencing a brief period of L UE weakness and slurred speech. PMH: HTN, obesity   Clinical Impression   Pt is performing ADL and ADL transfers at her baseline. No further OT needs.   Follow Up Recommendations  No OT follow up    Equipment Recommendations  None recommended by OT    Recommendations for Other Services       Precautions / Restrictions Precautions Precautions: None      Mobility Bed Mobility Overal bed mobility: Modified Independent                Transfers Overall transfer level: Independent                    Balance                                            ADL Overall ADL's : Independent                                             Vision     Perception     Praxis      Pertinent Vitals/Pain Pain Assessment: No/denies pain     Hand Dominance Right   Extremity/Trunk Assessment Upper Extremity Assessment Upper Extremity Assessment: Overall WFL for tasks assessed   Lower Extremity Assessment Lower Extremity Assessment: Overall WFL for tasks assessed   Cervical / Trunk Assessment Cervical / Trunk Assessment: Normal   Communication Communication Communication: No difficulties   Cognition Arousal/Alertness: Awake/alert Behavior During Therapy: WFL for tasks assessed/performed Overall Cognitive Status: Within Functional Limits for tasks assessed                     General Comments       Exercises       Shoulder Instructions      Home Living Family/patient expects to be discharged to:: Private residence Living Arrangements: Alone Available Help at Discharge: Family;Available PRN/intermittently Type of Home: House Home Access: Stairs to enter [employer] of  Steps: 2 Entrance Stairs-Rails: None Home Layout: One level (with a basement she does not use)     Bathroom Shower/Tub: Occupational psychologist: Handicapped height     Home Equipment: Environmental consultant - standard;Cane - single point          Prior Functioning/Environment Level of Independence: Independent             OT Diagnosis: Generalized weakness   OT Problem List:     OT Treatment/Interventions:      OT Goals(Current goals can be found in the care plan section)    OT Frequency:     Barriers to D/C:            Co-evaluation              End of Session Nurse Communication:  (pt does not want to be a DNR, misunderstood when asked)  Activity Tolerance: Patient tolerated treatment well Patient left: in chair;with call bell/phone within reach   Time: 0805-0830 OT Time Calculation (min): 25 min Charges:  OT General  Charges $OT Visit: 1 Procedure OT Evaluation $OT Eval Low Complexity: 1 Procedure G-Codes: OT G-codes **NOT FOR INPATIENT CLASS** Functional Assessment Tool Used: clinical judgement Functional Limitation: Self care Self Care Current Status [id number]): 0 percent impaired, limited or restricted Self Care Goal Status [id number]): 0 percent impaired, limited or restricted Self Care Discharge Status [id number]): 0 percent impaired, limited or restricted  [name] Collier 02/27/2016, 8:42 AM  [phone]

## 2016-02-27 NOTE — Progress Notes (Signed)
  Echocardiogram 2D Echocardiogram has been performed.  Jennette Dubin 02/27/2016, 4:38 PM

## 2016-02-27 NOTE — Progress Notes (Signed)
Nsg Discharge Note  Admit Date:  02/26/2016 Discharge date: 02/27/2016   Alexis Goodell to be D/C'd Home per MD order.  AVS completed.  Copy for chart, and copy for patient signed, and dated. Patient/caregiver able to verbalize understanding.  Discharge Medication:   Medication List    STOP taking these medications        omeprazole 40 MG capsule  Commonly known as:  PRILOSEC      TAKE these medications        aspirin 325 MG tablet  Take 1 tablet (325 mg total) by mouth daily.     cholecalciferol 1000 units tablet  Commonly known as:  VITAMIN D  Take 1,000 Units by mouth daily.     escitalopram 10 MG tablet  Commonly known as:  LEXAPRO  Take 10 mg by mouth daily.     famotidine 20 MG tablet  Commonly known as:  PEPCID  Take 1 tablet (20 mg total) by mouth 2 (two) times daily.     olmesartan-hydrochlorothiazide 40-25 MG tablet  Commonly known as:  BENICAR HCT  Take 1 tablet by mouth daily.     research study medication  Take 75 mg by mouth daily with breakfast.     rosuvastatin 20 MG tablet  Commonly known as:  CRESTOR  Take 1 tablet (20 mg total) by mouth every other day.     VITAMIN B 12 PO  Take 1 tablet by mouth daily.        Discharge Assessment: Filed Vitals:   02/27/16 0542 02/27/16 1411  BP: 150/48 147/64  Pulse: 72 73  Temp: 98.8 F (37.1 C) 98.3 F (36.8 C)  Resp: 18 18   Skin clean, dry and intact without evidence of skin break down, no evidence of skin tears noted. IV catheter discontinued intact. Site without signs and symptoms of complications - no redness or edema noted at insertion site, patient denies c/o pain - only slight tenderness at site.  Dressing with slight pressure applied.  D/c Instructions-Education: Discharge instructions given to patient/family with verbalized understanding. D/c education completed with patient/family including follow up instructions, medication list, d/c activities limitations if indicated, with other d/c  instructions as indicated by MD - patient able to verbalize understanding, all questions fully answered. Patient instructed to return to ED, call 911, or call MD for any changes in condition.  Patient escorted via [place], and D/C home via private auto.  Dayle Points, RN 02/27/2016 6:23 PM

## 2016-02-27 NOTE — Evaluation (Signed)
Physical Therapy Evaluation Patient Details Name: [name] Collier MRN: [id number] DOB: [birth year] Today's Date: 02/27/2016   History of Present Illness  Pt admitted from her MD's office after experiencing a brief period of L UE weakness and slurred speech. PMH: HTN, obesity  Clinical Impression  Pt reports she is at baseline of independence with mobility and that TIA symptoms are completely resolved. No deficits noted with BLE strength, sensation, ambulation or high level balance activities. No further PT indicated, PT signing off.     Follow Up Recommendations No PT follow up    Equipment Recommendations  None recommended by PT    Recommendations for Other Services       Precautions / Restrictions Precautions Precautions: None Restrictions Weight Bearing Restrictions: No      Mobility  Bed Mobility Overal bed mobility: Modified Independent             General bed mobility comments: up in recliner  Transfers Overall transfer level: Independent                  Ambulation/Gait Ambulation/Gait assistance: Independent Ambulation Distance (Feet): 150 Feet Assistive device: None Gait Pattern/deviations: WFL(Within Functional Limits)   Gait velocity interpretation: at or above normal speed for age/gender General Gait Details: steady without AD, no LOB  Stairs            Wheelchair Mobility    Modified Rankin (Stroke Patients Only)       Balance Overall balance assessment: Independent (no LOB with 360* turn, reaching over BOS in standing, standing with feet together, standing with eyes closed x 10 sec)                                           Pertinent Vitals/Pain Pain Assessment: No/denies pain    Home Living Family/patient expects to be discharged to:: Private residence Living Arrangements: Alone Available Help at Discharge: Family;Available PRN/intermittently (son lives nearby and checks in several times/day) Type of Home:  House Home Access: Stairs to enter Entrance Stairs-Rails: None Entrance Stairs-Number of Steps: 2 Home Layout: One level (with a basement she does not use) Home Equipment: Walker - standard;Cane - single point      Prior Function Level of Independence: Independent               Hand Dominance   Dominant Hand: Right    Extremity/Trunk Assessment   Upper Extremity Assessment: Overall WFL for tasks assessed           Lower Extremity Assessment: Overall WFL for tasks assessed (BLE strength 5/5, sensation intact to light touch BLEs)      Cervical / Trunk Assessment: Normal  Communication   Communication: No difficulties  Cognition Arousal/Alertness: Awake/alert Behavior During Therapy: WFL for tasks assessed/performed Overall Cognitive Status: Within Functional Limits for tasks assessed                      General Comments      Exercises        Assessment/Plan    PT Assessment Patent does not need any further PT services  PT Diagnosis     PT Problem List    PT Treatment Interventions     PT Goals (Current goals can be found in the Care Plan section) Acute Rehab PT Goals PT Goal Formulation: All assessment and education complete, DC therapy  Frequency     Barriers to discharge        Co-evaluation               End of Session   Activity Tolerance: Patient tolerated treatment well Patient left: in chair;with call bell/phone within reach Nurse Communication: Mobility status    Functional Assessment Tool Used: clinical judgement Functional Limitation: Mobility: Walking and moving around Mobility: Walking and Moving Around Current Status [phone]): 0 percent impaired, limited or restricted Mobility: Walking and Moving Around Goal Status [phone]): 0 percent impaired, limited or restricted Mobility: Walking and Moving Around Discharge Status [phone]): 0 percent impaired, limited or restricted    Time: [id number] PT Time Calculation  (min) (ACUTE ONLY): 11 min   Charges:   PT Evaluation $PT Eval Low Complexity: 1 Procedure     PT G Codes:   PT G-Codes **NOT FOR INPATIENT CLASS** Functional Assessment Tool Used: clinical judgement Functional Limitation: Mobility: Walking and moving around Mobility: Walking and Moving Around Current Status [id number]): 0 percent impaired, limited or restricted Mobility: Walking and Moving Around Goal Status [id number]): 0 percent impaired, limited or restricted Mobility: Walking and Moving Around Discharge Status [phone]): 0 percent impaired, limited or restricted    Philomena Doheny 02/27/2016, 9:49 AM [phone]

## 2016-02-27 NOTE — Discharge Summary (Signed)
 Name: [name] Collier MRN: [id number] DOB: 10/13/1946 70 y.o. PCP: Leonard Nyland, MD  Date of Admission: 02/26/2016 12:02 PM Date of Discharge: 02/27/2016 Attending Physician: James M Granfortuna, MD  Discharge Diagnosis: Active Problems:   TIA (transient ischemic attack)   Essential hypertension   Normochromic normocytic anemia   Pre-diabetes   Hyperlipidemia   GERD  Discharge Medications:   Medication List    STOP taking these medications        omeprazole 40 MG capsule  Commonly known as:  PRILOSEC      TAKE these medications        aspirin 325 MG tablet  Take 1 tablet (325 mg total) by mouth daily.     cholecalciferol 1000 units tablet  Commonly known as:  VITAMIN D  Take 1,000 Units by mouth daily.     escitalopram 10 MG tablet  Commonly known as:  LEXAPRO  Take 10 mg by mouth daily.     famotidine 20 MG tablet  Commonly known as:  PEPCID  Take 1 tablet (20 mg total) by mouth 2 (two) times daily.     olmesartan-hydrochlorothiazide 40-25 MG tablet  Commonly known as:  BENICAR HCT  Take 1 tablet by mouth daily.     research study medication  Take 75 mg by mouth daily with breakfast.     rosuvastatin 20 MG tablet  Commonly known as:  CRESTOR  Take 1 tablet (20 mg total) by mouth every other day.     VITAMIN B 12 PO  Take 1 tablet by mouth daily.        Disposition and follow-up:   [name] was discharged from [facility] in Stable condition.  At the hospital follow up visit please address:  1.  TIA: On ASA 325mg daily. She was enrolled in the POINT trial for stroke prevention and was started on study medications.    Hyperlipidemia: On Crestor 20mg every other day given her history of myalgias with statins in past. If she is unable to tolerate this, would consider pravastatin every other day.  GERD: Her home medication was discontinued and switched to Pepcid 20mg BID as PPIs are contraindicated during the POINT  trial.  Normocytic anemia: Multiple myeloma screen sent.  2.  Labs / imaging needed at time of follow-up: None  3.  Pending labs/ test needing follow-up: SPEP and IFE, Kappa/lambda light chains  Follow-up Appointments:     Follow-up Information    Follow up with NYLAND,LEONARD ROBERT, MD. Go on 03/03/2016.   Specialty:  Family Medicine   Why:  Hospital follow up on 11:15 am   Contact information:   [address] [place] [address] [phone]       Follow up with SETHI,PRAMOD, MD In 2 months.   Specialties:  Neurology, Radiology   Why:  Stroke Clinic, Office will call you with appointment date & time   Contact information:   [address] [place] [address] [phone]       Discharge Instructions: Discharge Instructions    Ambulatory referral to Neurology    Complete by:  As directed   Please schedule post stroke follow up in 2 months.     Call MD for:  difficulty breathing, headache or visual disturbances    Complete by:  As directed      Call MD for:  persistant dizziness or light-headedness    Complete by:  As directed      Call MD for:    persistant nausea and vomiting    Complete by:  As directed      Call MD for:  severe uncontrolled pain    Complete by:  As directed      Diet - low sodium heart healthy    Complete by:  As directed      Increase activity slowly    Complete by:  As directed            Consultations: Neurology  Procedures Performed:  Ct Head Wo Contrast  02/26/2016  CLINICAL DATA:  70-year-old female with a history of hypertension. Transient weakness and question of TIA EXAM: CT HEAD WITHOUT CONTRAST TECHNIQUE: Contiguous axial images were obtained from the base of the skull through the vertex without intravenous contrast. COMPARISON:  None. FINDINGS: Unremarkable appearance of the calvarium without acute fracture or aggressive lesion. Unremarkable appearance of the scalp soft tissues. Unremarkable appearance of the  bilateral orbits. Mastoid air cells are clear. No significant paranasal sinus disease No acute intracranial hemorrhage, midline shift, or mass effect. Gray-white differentiation is maintained, without CT evidence of acute ischemia. Unremarkable configuration of the ventricles. IMPRESSION: No CT evidence of acute intracranial abnormality. Signed, Jaime S. Wagner, DO [facility] Electronically Signed   By: Jaime  Wagner D.O.   On: 02/26/2016 13:30   Mr Brain Wo Contrast  02/26/2016  CLINICAL DATA:  Episode at 9:30 this morning of left arm weakness and slurred speech lasting 1-2 minutes. Initial encounter. EXAM: MRI HEAD WITHOUT CONTRAST MRA HEAD WITHOUT CONTRAST TECHNIQUE: Multiplanar, multiecho pulse sequences of the brain and surrounding structures were obtained without intravenous contrast. Angiographic images of the head were obtained using MRA technique without contrast. COMPARISON:  CT head without contrast 02/26/2016. FINDINGS: MRI HEAD FINDINGS The diffusion-weighted images demonstrate no evidence for acute or subacute infarction. Mild periventricular and scattered subcortical T2 changes bilaterally are somewhat advanced for age. No acute hemorrhage or mass lesion is present. The ventricles are of normal size. No significant extra-axial fluid collection is present. The brainstem and cerebellum are within normal limits. The internal auditory canals are normal bilaterally. Flow is present in the major intracranial arteries. Bilateral lens replacements are present. The globes and orbits are intact. The paranasal sinuses and mastoid air cells are clear. MRA HEAD FINDINGS Internal carotid arteries are within normal limits from the high cervical through the ICA termini bilaterally. The A1 and M1 segments are normal. The MCA bifurcations are intact. The anterior communicating artery is patent. ACA and MCA branch vessels are within normal limits. The right  vertebral artery is the dominant vessel. The right PICA origin is visualized and normal. Both AICA vessels are intact. A fetal type left posterior cerebral artery is present. There is a small left P1 segment. The PCA branch vessels are normal. IMPRESSION: 1. No acute intracranial abnormality. 2. Mild periventricular and scattered subcortical T2 changes bilaterally are somewhat advanced for age. 3. Normal variant MRA circle of Willis without evidence for significant proximal stenosis, aneurysm, or branch vessel occlusion. Electronically Signed   By: Christopher  Mattern M.D.   On: 02/26/2016 19:40   Mr Mra Head/brain Wo Cm  02/26/2016  CLINICAL DATA:  Episode at 9:30 this morning of left arm weakness and slurred speech lasting 1-2 minutes. Initial encounter. EXAM: MRI HEAD WITHOUT CONTRAST MRA HEAD WITHOUT CONTRAST TECHNIQUE: Multiplanar, multiecho pulse sequences of the brain and surrounding structures were obtained without intravenous contrast. Angiographic images of the head were obtained using MRA technique without   contrast. COMPARISON:  CT head without contrast 02/26/2016. FINDINGS: MRI HEAD FINDINGS The diffusion-weighted images demonstrate no evidence for acute or subacute infarction. Mild periventricular and scattered subcortical T2 changes bilaterally are somewhat advanced for age. No acute hemorrhage or mass lesion is present. The ventricles are of normal size. No significant extra-axial fluid collection is present. The brainstem and cerebellum are within normal limits. The internal auditory canals are normal bilaterally. Flow is present in the major intracranial arteries. Bilateral lens replacements are present. The globes and orbits are intact. The paranasal sinuses and mastoid air cells are clear. MRA HEAD FINDINGS Internal carotid arteries are within normal limits from the high cervical through the ICA termini bilaterally. The A1 and M1 segments are normal. The MCA bifurcations are intact. The  anterior communicating artery is patent. ACA and MCA branch vessels are within normal limits. The right vertebral artery is the dominant vessel. The right PICA origin is visualized and normal. Both AICA vessels are intact. A fetal type left posterior cerebral artery is present. There is a small left P1 segment. The PCA branch vessels are normal. IMPRESSION: 1. No acute intracranial abnormality. 2. Mild periventricular and scattered subcortical T2 changes bilaterally are somewhat advanced for age. 3. Normal variant MRA circle of Willis without evidence for significant proximal stenosis, aneurysm, or branch vessel occlusion. Electronically Signed   By: San Morelle M.D.   On: 02/26/2016 19:40    2D Echo:  Study Conclusions  - Left ventricle: The cavity size was normal. There was mild  concentric hypertrophy. Systolic function was normal. The  estimated ejection fraction was in the range of 55% to 60%. Wall  motion was normal; there were no regional wall motion  abnormalities. - Mitral valve: Mildly calcified annulus. There was mild to  moderate regurgitation. - Left atrium: The atrium was moderately to severely dilated. - Pulmonary arteries: Systolic pressure was mildly increased. PA  peak pressure: 31 mm Hg (S).  Impressions:  - No cardiac source of embolism was identified, but cannot be ruled  out on the basis of this examination.  Recommendations: Consider transesophageal echocardiography if clinically indicated. Transthoracic echocardiography. M-mode, complete 2D, spectral Doppler, and color Doppler. Birthdate: Patient birthdate: 1946/04/12. Age: Patient is 70 yr old. Sex: Gender: female. BMI: 46.7 kg/m^2. Blood pressure:  147/64 Patient status: Inpatient. Study date: Study date: 02/27/2016. Study time: 03:39 PM. Location: Bedside.  Vascular US Carotid Duplex Bilateral:  Arterial  flow:  +--------------------+---------+--------+ Location V sys V ed  +--------------------+---------+--------+ Right CCA - proximal-109 cm/s-16 cm/s +--------------------+---------+--------+ Right CCA - distal 80 cm/s 16 cm/s  +--------------------+---------+--------+ Right ECA -135 cm/s-14 cm/s +--------------------+---------+--------+ Right ICA - proximal-96 cm/s -21 cm/s +--------------------+---------+--------+ Right ICA - distal -80 cm/s -25 cm/s +--------------------+---------+--------+ Right vertebral 52 cm/s 14 cm/s  +--------------------+---------+--------+ Left CCA - proximal 112 cm/s 17 cm/s  +--------------------+---------+--------+ Left CCA - distal -78 cm/s -17 cm/s +--------------------+---------+--------+ Left ECA -98 cm/s -12 cm/s +--------------------+---------+--------+ Left ICA - proximal -52 cm/s -17 cm/s +--------------------+---------+--------+ Left ICA - distal -90 cm/s -29 cm/s +--------------------+---------+--------+ Left vertebral 30 cm/s 4 cm/s  +--------------------+---------+--------+  ------------------------------------------------------------------- Summary: Bilateral: intimal wall thickening CCA. Mild mixed plaque origin ICA. 1-39% ICA plaquing. Vertebral artery flow is antegrade.  Admission HPI: 70 year old woman with history of GERD, HTN, anxiety presenting with slurred speech and left arm weakness. She woke up at 7AM today. She went to lie down on her sofa at around [facility] for a few minutes. The phone rang and she tried to pick up her phone.  She noticed her left arm was weak. She had word finding difficulty initially then slurred speech. This lasted about 1 minute. She had recovery of her arm strength and her speech returned to baseline. She took two tablets of aspirin 325mg. She does not take ASA daily. She is ambidextrous. She was told  by a physician several years ago that she had old strokes on her CT head  Hospital Course by problem list:   1. TIA: CT head demonstrates no acute intracranial abnormality. EKG demonstrates NSR, no arrhythmias or acute ischemic changes. Initial troponin negative. She was admitted to telemetry. MRI/MRA head with no acute intracranial abnormality. Carotid doppler and echo results unremarkable. She was placed on ASA 325mg daily. She was enrolled in the POINT trial for stroke prevention and was started on study medications. She had no further recurrence of TIA symptoms or new neurologic symptoms. She was discharged with follow up instructions with her PCP and neurology.   2. HTN: She was continued on her home olmesartan-HCTZ after results from MRI head did not demonstrate acute stroke.  3. Pre-diabetes: Hgb A1c 5.9%   4. Hyperlipidemia: Total cholesterol 202, LDL 139, HDL 44. She was started on Crestor 20mg every other day given her history of myalgias with statins in past. If she is unable to tolerate this, would consider pravastatin every other day.  4. GERD: Cont home omeprazole as pantoprazole. Her home medication was discontinued and switched to Pepcid 20mg BID as PPIs are contraindicated during the POINT trial.  5. Normocytic anemia: Multiple myeloma screen sent.  Discharge Vitals:   BP 147/64 mmHg  Pulse 73  Temp(Src) 98.3 F (36.8 C) (Oral)  Resp 18  Ht 5' 6" (1.676 m)  Wt 289 lb 7.4 oz (131.3 kg)  BMI 46.74 kg/m2  SpO2 96%  Discharge Labs:  Results for orders placed or performed during the hospital encounter of 02/26/16 (from the past 24 hour(s))  Lipid panel     Status: Abnormal   Collection Time: 02/27/16  5:19 AM  Result Value Ref Range   Cholesterol 202 (H) 0 - 200 mg/dL   Triglycerides 97 <150 mg/dL   HDL 44 >40 mg/dL   Total CHOL/HDL Ratio 4.6 RATIO   VLDL 19 0 - 40 mg/dL   LDL Cholesterol 139 (H) 0 - 99 mg/dL  TSH     Status: None   Collection Time: 02/27/16  7:42  AM  Result Value Ref Range   TSH 4.001 0.350 - 4.500 uIU/mL    Signed: Jennifer T Krall, MD 02/27/2016, 5:21 PM    Services Ordered on Discharge: None Equipment Ordered on Discharge: None  

## 2016-02-27 NOTE — Care Management Note (Signed)
Case Management Note  Patient Details  Name: Ahmoni Durazo MRN: [id number] Date of Birth: 1945-12-13  Subjective/Objective:  Admitted 02/26/2016 with TIA.                   Action/Plan: Anticipate discharge home Friday if continues to improve. . No further CM needs but will be available should additional discharge needs arise.   Expected Discharge Date:                  Expected Discharge Plan:     In-House Referral:     Discharge planning Services     Post Acute Care Choice:    Choice offered to:     DME Arranged:    DME Agency:     HH Arranged:    [place] Agency:     Status of Service:     Medicare Important Message Given:    Date Medicare IM Given:    Medicare IM give by:    Date Additional Medicare IM Given:    Additional Medicare Important Message give by:     If discussed at [place] of Stay Meetings, dates discussed:    Additional Comments:  Delrae Sawyers, RN 02/27/2016, 2:43 PM

## 2016-02-27 NOTE — Progress Notes (Signed)
STROKE TEAM PROGRESS NOTE   HISTORY OF PRESENT ILLNESS [name] Collier is an 70 y.o. female who presents after an episode of slurred speech and left arm weakness. She states that 9:30 this morning she heard the phone ringing and noticed that she couldn't move her left arm to pick up the phone. She used her right arm to pick up the phone and noted that she couldn't talk. She knew that she went to say hello but she couldn't get her words out. She states shortly after that she was able to say hello but it was slurred. Just prior to this incident, she was normal and can move all extremities normally. She states this lasted about 1-2 minutes. Following that she was able to move all extremities normally and speak normally. She drove to her PCP who called EMS and she was transported here. She's had no further symptoms. She admits to not taking ASA daily, but did take 650 mg this AM after her symptoms started. Her symptoms were resolved at the time of bedside Neurological evaluation. She was LKW 3.22.2017 at 09:30 am. Patient was not administered IV t-PA secondary to symptoms resolved. She was admitted for further evaluation and treatment.   SUBJECTIVE (INTERVAL HISTORY) No family is at the bedside.  Overall she feels her condition is stable.    OBJECTIVE Temp:  [98 F (36.7 C)-98.8 F (37.1 C)] 98.8 F (37.1 C) (03/23 0542) Pulse Rate:  [63-82] 72 (03/23 0542) Cardiac Rhythm:  [-]  Resp:  [18-22] 18 (03/23 0542) BP: (131-190)/(48-87) 150/48 mmHg (03/23 0542) SpO2:  [95 %-98 %] 95 % (03/23 0542) Weight:  [131.3 kg (289 lb 7.4 oz)] 131.3 kg (289 lb 7.4 oz) (03/22 1753)  CBC:  Recent Labs Lab 02/26/16 1315 02/26/16 1320  WBC  --  7.7  NEUTROABS  --  4.8  HGB 12.2 10.7*  HCT 36.0 34.8*  MCV  --  87.9  PLT  --  893    Basic Metabolic Panel:  Recent Labs Lab 02/26/16 1315 02/26/16 1320  NA 140 141  K 4.4 4.4  CL 103 106  CO2  --  26  GLUCOSE 102* 107*  BUN 14 12  CREATININE 0.70 0.74   CALCIUM  --  9.1    Lipid Panel:    Component Value Date/Time   CHOL 202* 02/27/2016 0519   TRIG 97 02/27/2016 0519   HDL 44 02/27/2016 0519   CHOLHDL 4.6 02/27/2016 0519   VLDL 19 02/27/2016 0519   LDLCALC 139* 02/27/2016 0519   HgbA1c: No results found for: HGBA1C Urine Drug Screen:    Component Value Date/Time   LABOPIA NONE DETECTED 02/26/2016 1338   COCAINSCRNUR NONE DETECTED 02/26/2016 1338   LABBENZ NONE DETECTED 02/26/2016 1338   AMPHETMU NONE DETECTED 02/26/2016 1338   THCU NONE DETECTED 02/26/2016 1338   LABBARB NONE DETECTED 02/26/2016 1338      IMAGING  Ct Head Wo Contrast 02/26/2016   No CT evidence of acute intracranial abnormality.    Mri & Mra Brain Wo Contrast 02/26/2016   1. No acute intracranial abnormality. 2. Mild periventricular and scattered subcortical T2 changes bilaterally are somewhat advanced for age. 3. Normal variant MRA circle of Willis without evidence for significant proximal stenosis, aneurysm, or branch vessel occlusion.   Carotid Doppler   There is 1-39% bilateral ICA stenosis. Vertebral artery flow is antegrade.      PHYSICAL EXAM Pleasant obese caucasian lady not in distress. . Afebrile. Head is nontraumatic. Neck is  supple without bruit.    Cardiac exam no murmur or gallop. Lungs are clear to auscultation. Distal pulses are well felt. Neurological Exam ;  Awake  Alert oriented x 3. Normal speech and language.eye movements full without nystagmus.fundi were not visualized. Vision acuity and fields appear normal. Hearing is normal. Palatal movements are normal. Face symmetric. Tongue midline. Normal strength, tone, reflexes and coordination. Normal sensation. Gait deferred. ASSESSMENT/PLAN [name] is a 70 y.o. female with history of HTN and smoking presenting with slurred speech and left arm weakness. She did not receive IV t-PA due to symptoms resolved.   R brain TIA  MRI  No acute stroke. Advanced T2 changes.  MRA   Unremarkable   Carotid Doppler  No significant stenosis   2D Echo  pending   LDL 139  HgbA1c pending  No pharmaceuticals for VTE prophylaxis as she is in the POINT study. Patient without deficits, independent with ambulation. No SCDs ordered.  Diet Heart Room service appropriate?: Yes; Fluid consistency:: Thin  aspirin 325 mg daily prior to admission, now enrolled in  POINT study drug(s)  Patient counseled to be compliant with her antithrombotic medications. POINT study drug needs to be dispensed at discharge. Order written.  Ongoing aggressive stroke risk factor management  Therapy recommendations:  No therapy needs  Disposition:  Return home  Follow up 2D results  Hypertension  Stable  Hyperlipidemia  Home meds:  No statin  LDL 139, goal < 70  New crestor 20 mg added   Continue statin at discharge  Other Stroke Risk Factors  Advanced age  Morbid Obesity, Body mass index is 46.74 kg/(m^2).   Family hx stroke (son, maternal uncles)  Other Active Problems  Anxiety  GERD - changed PPI to pepcid 20 bid as PPIs are contraindicated during the [facility] day #   [place] for Pager information 02/27/2016 12:22 PM  I have personally examined this patient, reviewed notes, independently viewed imaging studies, participated in medical decision making and plan of care. I have made any additions or clarifications directly to the above note. Agree with note above. She presented with transient slurred speech and left arm weakness due to right brain TIA etiology likely small vessel disease. She remains at risk for neurological worsening, recurrent stroke, TIA needs ongoing stroke evaluation and aggressive risk factor modification.  She is participating in the POINT trial for stroke prevention. She met inclusion criteria and was given opportunity to ask questions which were answered prior to her signing consent form. No  study specific procedure was obtained prior to patient signing consent form.   Antony Contras, MD Medical Director [facility] Pager: [phone] 02/27/2016 3:04 PM    To contact Stroke Continuity provider, please refer to [email or web address] After hours, contact General Neurology

## 2016-02-27 NOTE — Discharge Instructions (Signed)
-Please follow up with your PCP Dr. Edrick Oh on Tuesday 3/28 at 11:15 am. An appointment has been scheduled before you -Be sure to continue your medications to reduce your risk for future stroke.  -Please follow up with a neurologist    A transient ischemic attack (TIA) is a "warning stroke" that causes stroke-like symptoms. A TIA does not cause lasting damage to the brain. The symptoms of a TIA can happen fast and do not last long. It is important to know the symptoms of a TIA and what to do. This can help prevent stroke or death.   HOME CARE   Take medicines only as told by your doctor. Make sure you understand all of the instructions.  You may need to take aspirin or warfarin medicine. Warfarin needs to be taken exactly as told.  Taking too much or too little warfarin is dangerous. Blood tests must be done as often as told by your doctor. A PT blood test measures how long it takes for blood to clot. Your PT is used to calculate another value called an INR. Your PT and INR help your doctor adjust your warfarin dosage. He or she will make sure you are taking the right amount.  Food can cause problems with warfarin and affect the results of your blood tests. This is true for foods high in vitamin K. Eat the same amount of foods high in vitamin K each day. Foods high in vitamin K include spinach, kale, broccoli, cabbage, collard and turnip greens, Brussels sprouts, peas, cauliflower, seaweed, and parsley. Other foods high in vitamin K include beef and pork liver, green tea, and soybean oil. Eat the same amount of foods high in vitamin K each day. Avoid big changes in your diet. Tell your doctor before changing your diet. Talk to a food specialist (dietitian) if you have questions.  Many medicines can cause problems with warfarin and affect your PT and INR. Tell your doctor about all medicines you take. This includes vitamins and dietary pills (supplements). Do not take or stop taking any prescribed or  over-the-counter medicines unless your doctor tells you to.  Warfarin can cause more bruising or bleeding. Hold pressure over any cuts for longer than normal. Talk to your doctor about other side effects of warfarin.  Avoid sports or activities that may cause injury or bleeding.  Be careful when you shave, floss, or use sharp objects.  Avoid or drink very little alcohol while taking warfarin. Tell your doctor if you change how much alcohol you drink.  Tell your dentist and other doctors that you take warfarin before any procedures.  Follow your diet program as told, if you are given one.  Keep a healthy weight.  Stay active. Try to get at least 30 minutes of activity on all or most days.  Do not use any tobacco products, including cigarettes, chewing tobacco, or electronic cigarettes. If you need help quitting, ask your doctor.  Limit alcohol intake to no more than 1 drink per day for nonpregnant women and 2 drinks per day for men. One drink equals 12 ounces of beer, 5 ounces of wine, or 1 ounces of hard liquor.  Do not abuse drugs.  Keep your home safe so you do not fall. You can do this by:  Putting grab bars in the bedroom and bathroom.  Raising toilet seats.  Putting a seat in the shower.  Keep all follow-up visits as told by your doctor. This is important. GET HELP IF:  Your personality changes.  You have trouble swallowing.  You have double vision.  You are dizzy.  You have a fever. GET HELP RIGHT AWAY IF:  These symptoms may be an emergency. Do not wait to see if the symptoms will go away. Get medical help right away. Call your local emergency services (911 in the [place] Do not drive yourself to the hospital.  You have sudden weakness or lose feeling (go numb), especially on one side of the body. This can affect your:  Face.  Arm.  Leg.  You have sudden trouble walking.  You have sudden trouble moving your arms or legs.  You have sudden  confusion.  You have trouble talking.  You have trouble understanding.  You have sudden trouble seeing in one or both eyes.  You lose your balance.  Your movements are not smooth.  You have a sudden, very bad headache with no known cause.  You have new chest pain.  Your heartbeat is unsteady.  You are partly or totally unaware of what is going on around you. MAKE SURE YOU:   Understand these instructions.  Will watch your condition.  Will get help right away if you are not doing well or get worse.   This information is not intended to replace advice given to you by your health care provider. Make sure you discuss any questions you have with your health care provider.   Document Released: 09/01/2008 Document Revised: 12/14/2014 Document Reviewed: 02/28/2014 Elsevier Interactive Patient Education [employer].

## 2016-02-27 NOTE — Progress Notes (Signed)
VASCULAR LAB PRELIMINARY  PRELIMINARY  PRELIMINARY  PRELIMINARY  Carotid duplex completed.    Preliminary report:  1-39% ICA plaquing.  Vertebral artery flow is antegrade.   Angelika Jerrett, RVT 02/27/2016, 10:34 AM

## 2016-02-28 LAB — KAPPA/LAMBDA LIGHT CHAINS
Kappa free light chain: 31.07 mg/L — ABNORMAL HIGH (ref 3.30–19.40)
Kappa, lambda light chain ratio: 2.27 — ABNORMAL HIGH (ref 0.26–1.65)
LAMDA FREE LIGHT CHAINS: 13.67 mg/L (ref 5.71–26.30)

## 2016-02-28 LAB — HEMOGLOBIN A1C
HEMOGLOBIN A1C: 5.9 % — AB (ref 4.8–5.6)
Mean Plasma Glucose: 123 mg/dL

## 2016-03-02 LAB — MULTIPLE MYELOMA PANEL, SERUM
ALBUMIN/GLOB SERPL: 0.9 (ref 0.7–1.7)
Albumin SerPl Elph-Mcnc: 3 g/dL (ref 2.9–4.4)
Alpha 1: 0.3 g/dL (ref 0.0–0.4)
Alpha2 Glob SerPl Elph-Mcnc: 0.7 g/dL (ref 0.4–1.0)
B-Globulin SerPl Elph-Mcnc: 1.2 g/dL (ref 0.7–1.3)
GAMMA GLOB SERPL ELPH-MCNC: 1.3 g/dL (ref 0.4–1.8)
GLOBULIN, TOTAL: 3.6 g/dL (ref 2.2–3.9)
IGA: 417 mg/dL — AB (ref 87–352)
IGM, SERUM: 117 mg/dL (ref 26–217)
IgG (Immunoglobin G), Serum: 1304 mg/dL (ref 700–1600)
Total Protein ELP: 6.6 g/dL (ref 6.0–8.5)

## 2016-03-28 ENCOUNTER — Other Ambulatory Visit: Payer: Self-pay | Admitting: Pulmonary Disease

## 2016-05-11 ENCOUNTER — Ambulatory Visit (INDEPENDENT_AMBULATORY_CARE_PROVIDER_SITE_OTHER): Payer: Medicare Other | Admitting: Neurology

## 2016-05-11 ENCOUNTER — Encounter: Payer: Self-pay | Admitting: Neurology

## 2016-05-11 VITALS — BP 160/85 | HR 65 | Ht 66.0 in | Wt 290.0 lb

## 2016-05-11 DIAGNOSIS — M6289 Other specified disorders of muscle: Secondary | ICD-10-CM

## 2016-05-11 DIAGNOSIS — R251 Tremor, unspecified: Secondary | ICD-10-CM | POA: Diagnosis not present

## 2016-05-11 DIAGNOSIS — R29898 Other symptoms and signs involving the musculoskeletal system: Secondary | ICD-10-CM | POA: Insufficient documentation

## 2016-05-11 DIAGNOSIS — F419 Anxiety disorder, unspecified: Secondary | ICD-10-CM

## 2016-05-11 NOTE — Patient Instructions (Signed)
I had a long d/w patient about her recent TIA, risk for recurrent stroke/TIAs, personally independently reviewed imaging studies and stroke evaluation results and answered questions.Continue POINT study medication  for secondary stroke prevention and maintain strict control of hypertension with blood pressure goal below 130/90, diabetes with hemoglobin A1c goal below 6.5% and lipids with LDL cholesterol goal below 70 mg/dL. I also advised the patient to eat a healthy diet with plenty of whole grains, cereals, fruits and vegetables, exercise regularly and maintain ideal body weight .I also advised the patient to increase participation in activities for stress relaxation like meditation, yoga and regular exercises to reduce her underlying anxiety which may be contributing to her fine hand tremors. Followup in the future with me on 05/22/16 for end of POINT study visit as scheduled. Stroke Prevention Some medical conditions and behaviors are associated with an increased chance of having a stroke. You may prevent a stroke by making healthy choices and managing medical conditions. HOW CAN I REDUCE MY RISK OF HAVING A STROKE?   Stay physically active. Get at least 30 minutes of activity on most or all days.  Do not smoke. It may also be helpful to avoid exposure to secondhand smoke.  Limit alcohol use. Moderate alcohol use is considered to be:  No more than 2 drinks per day for men.  No more than 1 drink per day for nonpregnant women.  Eat healthy foods. This involves:  Eating 5 or more servings of fruits and vegetables a day.  Making dietary changes that address high blood pressure (hypertension), high cholesterol, diabetes, or obesity.  Manage your cholesterol levels.  Making food choices that are high in fiber and low in saturated fat, trans fat, and cholesterol may control cholesterol levels.  Take any prescribed medicines to control cholesterol as directed by your health care  provider.  Manage your diabetes.  Controlling your carbohydrate and sugar intake is recommended to manage diabetes.  Take any prescribed medicines to control diabetes as directed by your health care provider.  Control your hypertension.  Making food choices that are low in salt (sodium), saturated fat, trans fat, and cholesterol is recommended to manage hypertension.  Ask your health care provider if you need treatment to lower your blood pressure. Take any prescribed medicines to control hypertension as directed by your health care provider.  If you are 33-66 years of age, have your blood pressure checked every 3-5 years. If you are 62 years of age or older, have your blood pressure checked every year.  Maintain a healthy weight.  Reducing calorie intake and making food choices that are low in sodium, saturated fat, trans fat, and cholesterol are recommended to manage weight.  Stop drug abuse.  Avoid taking birth control pills.  Talk to your health care provider about the risks of taking birth control pills if you are over 64 years old, smoke, get migraines, or have ever had a blood clot.  Get evaluated for sleep disorders (sleep apnea).  Talk to your health care provider about getting a sleep evaluation if you snore a lot or have excessive sleepiness.  Take medicines only as directed by your health care provider.  For some people, aspirin or blood thinners (anticoagulants) are helpful in reducing the risk of forming abnormal blood clots that can lead to stroke. If you have the irregular heart rhythm of atrial fibrillation, you should be on a blood thinner unless there is a good reason you cannot take them.  Understand all  your medicine instructions.  Make sure that other conditions (such as anemia or atherosclerosis) are addressed. SEEK IMMEDIATE MEDICAL CARE IF:   You have sudden weakness or numbness of the face, arm, or leg, especially on one side of the body.  Your face  or eyelid droops to one side.  You have sudden confusion.  You have trouble speaking (aphasia) or understanding.  You have sudden trouble seeing in one or both eyes.  You have sudden trouble walking.  You have dizziness.  You have a loss of balance or coordination.  You have a sudden, severe headache with no known cause.  You have new chest pain or an irregular heartbeat. Any of these symptoms may represent a serious problem that is an emergency. Do not wait to see if the symptoms will go away. Get medical help at once. Call your local emergency services (911 in [place] Do not drive yourself to the hospital.   This information is not intended to replace advice given to you by your health care provider. Make sure you discuss any questions you have with your health care provider.   Document Released: 12/31/2004 Document Revised: 12/14/2014 Document Reviewed: 05/26/2013 Elsevier Interactive Patient Education [employer].

## 2016-05-12 NOTE — Progress Notes (Signed)
[facility] [address] [place]. [place] [address] [phone]       OFFICE FOLLOW-UP NOTE  [name] Date of Birth:  05-27-1946 Medical Record Number:  [id number]   HPI: 62 year lady seen today for first office follow-up visit following hospital admission for TIA in March 2017.[name] Collier is an 70 y.o. female who presents after an episode of slurred speech and left arm weakness. She states that 9:30 this morning she heard the phone ringing and noticed that she couldn't move her left arm to pick up the phone. She used her right arm to pick up the phone and noted that she couldn't talk. She knew that she went to say hello but she couldn't get her words out. She states shortly after that she was able to say hello but it was slurred. Just prior to this incident, she was normal and can move all extremities normally. She states this lasted about 1-2 minutes. Following that she was able to move all extremities normally and speak normally. She drove to her PCP who called EMS and she was transported here. She's had no further symptoms. She admits to not taking ASA daily, but did take 650 mg this AM after her symptoms started. Her symptoms were resolved at the time of bedside neurological evaluation. She was LKW 3.22.2017 at 09:30 am. Patient was not administered IV t-PA secondary to symptoms resolved. She was admitted for further evaluation and treatment. Patient qualified for and was enrolled in the POINT trial ( aspirin and Plavix him back to aspirin and placebo ). Transthoracic echo showed normal ejection fraction. Carotid ultrasound showed no significant extra cans stenosis. LDL cholesterol was elevated at 139. Hemoglobin A1c was normal. Patient states she's done well since discharge. She is tolerating study medication without significant bruising or bleeding. She complains of occasional trembling of her hands particularly when she is tired but this is not significant or  bothersome. She did not tolerate Crestor due to myalgias and primary care physician switched her to protocol which so far she seems to be tolerating. He does complain of some anxiety. She's had no recurrent stroke or TIA symptoms. She states her blood pressure is well controlled and today it is slightly high at 160/85.   ROS:   14 system review of systems is positive for  no complaints today. PMH:  Past Medical History  Diagnosis Date  . HTN (hypertension)   . Hyperlipidemia   . GERD (gastroesophageal reflux disease)     Social History:  Social History   Social History  . Marital Status: Widowed    Spouse Name: N/A  . Number of Children: 2  . Years of Education: 12   Occupational History  . Not on file.   Social History Main Topics  . Smoking status: Never Smoker   . Smokeless tobacco: Not on file  . Alcohol Use: No  . Drug Use: No  . Sexual Activity: Not on file   Other Topics Concern  . Not on file   Social History Narrative   Lives alone   Caffeine use: Soda/tea daily   Very little coffee    Medications:   Current Outpatient Prescriptions on File Prior to Visit  Medication Sig Dispense Refill  . aspirin 325 MG tablet Take 1 tablet (325 mg total) by mouth daily.    . cholecalciferol (VITAMIN D) 1000 units tablet Take 1,000 Units by mouth daily.    . Cyanocobalamin (VITAMIN B 12 PO) Take 1  tablet by mouth daily.    Marland Kitchen escitalopram (LEXAPRO) 10 MG tablet Take 10 mg by mouth daily.    . famotidine (PEPCID) 20 MG tablet Take 1 tablet (20 mg total) by mouth 2 (two) times daily. 60 tablet 0  . olmesartan-hydrochlorothiazide (BENICAR HCT) 40-25 MG tablet Take 1 tablet by mouth daily.    . research study medication Take 75 mg by mouth daily with breakfast.     No current facility-administered medications on file prior to visit.    Allergies:   Allergies  Allergen Reactions  . Codeine Other (See Comments)  . Prednisone Other (See Comments)  . Augmentin  [Amoxicillin-Pot Clavulanate]     c-diff    Physical Exam General: well developed, well nourished middle aged lady, seated, in no evident distress Head: head normocephalic and atraumatic.  Neck: supple with no carotid or supraclavicular bruits Cardiovascular: regular rate and rhythm, no murmurs Musculoskeletal: no deformity Skin:  no rash/petichiae Vascular:  Normal pulses all extremities Filed Vitals:   05/11/16 1327  BP: 160/85  Pulse: 65   Neurologic Exam Mental Status: Awake and fully alert. Oriented to place and time. Recent and remote memory intact. Attention span, concentration and fund of knowledge appropriate. Mood and affect appropriate.  Cranial Nerves: Fundoscopic exam reveals sharp disc margins. Pupils equal, briskly reactive to light. Extraocular movements full without nystagmus. Visual fields full to confrontation. Hearing intact. Facial sensation intact. Face, tongue, palate moves normally and symmetrically.  Motor: Normal bulk and tone. Normal strength in all tested extremity muscles. Sensory.: intact to touch ,pinprick .position and vibratory sensation.  Coordination: Rapid alternating movements normal in all extremities. Finger-to-nose and heel-to-shin performed accurately bilaterally. Gait and Station: Arises from chair without difficulty. Stance is normal. Gait demonstrates normal stride length and balance . Able to heel, toe and tandem walk without difficulty.  Reflexes: 1+ and symmetric. Toes downgoing.   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 She is enrolled in the POINT  trial    PLAN: I had a long d/w patient about her recent TIA, risk for recurrent stroke/TIAs, personally independently reviewed imaging studies and stroke evaluation results and answered questions.Continue POINT study medication  for secondary stroke  prevention and maintain strict control of hypertension with blood pressure goal below 130/90, diabetes with hemoglobin A1c goal below 6.5% and lipids with LDL cholesterol goal below 70 mg/dL. I also advised the patient to eat a healthy diet with plenty of whole grains, cereals, fruits and vegetables, exercise regularly and maintain ideal body weight .I also advised the patient to increase participation in activities for stress relaxation like meditation, yoga and regular exercises to reduce her underlying anxiety which may be contributing to her fine hand tremors. Followup in the future with me on 05/22/16 for end of POINT study visit as scheduled Greater than 50% of time during this 25 minute visit was spent on counseling,explanation of diagnosis, planning of further management, discussion with patient and family and coordination of care Antony Contras, MD  [facility] [address] [place], [place] [address]  Phone [phone] Fax [phone] Note: This document was prepared with digital dictation and possible smart phrase technology. Any transcriptional errors that result from this process are unintentional

## 2016-05-22 ENCOUNTER — Encounter (INDEPENDENT_AMBULATORY_CARE_PROVIDER_SITE_OTHER): Payer: Self-pay | Admitting: Neurology

## 2016-05-22 ENCOUNTER — Telehealth: Payer: Self-pay | Admitting: Neurology

## 2016-05-22 DIAGNOSIS — G459 Transient cerebral ischemic attack, unspecified: Secondary | ICD-10-CM

## 2016-05-22 NOTE — Progress Notes (Signed)
Pt came in for end of trial visit for POINT trial. She has been doing well and has no complains. She has no symptoms at this time. She has been compliant with medication. She is on ASA 325mg  and the investigational medication. She had last visit with Dr. Leonie Man on 05/11/16.   Today NIHSS = 0 and mRS = 0. She will continue ASA 325mg  only and will follow up with Dr. Leonie Man in about 2-3 months.   Katrina, could you please make her an appointment with Dr. Leonie Man in about 2-3 months? Thanks.  Rosalin Hawking, MD PhD Stroke Neurology 05/22/2016 1:34 PM

## 2016-05-22 NOTE — Telephone Encounter (Signed)
Patient had research POINT study appt today and was seen by dr Erlinda Hong

## 2016-05-22 NOTE — Telephone Encounter (Signed)
SHE THOUGHT SHE HAD APPT TODAY 05/22/16 BUT SHE DID NOT HAVE APPT SCHEDULED. PLEASE CHECK HER CHART TO SEE WHAT SHE NEEDS TO DO.

## 2016-05-25 NOTE — Progress Notes (Signed)
IF patient calls back she just needs to schedule a follow up with Dr.Sethi in 2 to 3 months per Dr.Xu note below. Lft vm for patient.

## 2016-05-25 NOTE — Telephone Encounter (Signed)
IF patient calls back she needs to schedule follow up with Dr. Leonie Man in 2 to 3 months. LFt vm for patient to call back.

## 2016-05-26 NOTE — Telephone Encounter (Signed)
Appt made with Dr.Sethi in August 2017.

## 2016-08-04 ENCOUNTER — Encounter: Payer: Self-pay | Admitting: Neurology

## 2016-08-04 ENCOUNTER — Ambulatory Visit (INDEPENDENT_AMBULATORY_CARE_PROVIDER_SITE_OTHER): Payer: Medicare Other | Admitting: Neurology

## 2016-08-04 VITALS — BP 137/71 | HR 71 | Ht 66.0 in | Wt 283.4 lb

## 2016-08-04 DIAGNOSIS — G459 Transient cerebral ischemic attack, unspecified: Secondary | ICD-10-CM | POA: Diagnosis not present

## 2016-08-04 NOTE — Progress Notes (Signed)
[facility] [address] [place]. [place] [address] [phone]       OFFICE FOLLOW-UP NOTE  [name] Date of Birth:  February 17, 1946 Medical Record Number:  [id number]   HPI:   Prior visit  05/11/2016 : 89 year lady seen today for first office follow-up visit following hospital admission for TIA in March 2017.[name] Collier is an 70 y.o. female who presents after an episode of slurred speech and left arm weakness. She states that 9:30 this morning she heard the phone ringing and noticed that she couldn't move her left arm to pick up the phone. She used her right arm to pick up the phone and noted that she couldn't talk. She knew that she went to say hello but she couldn't get her words out. She states shortly after that she was able to say hello but it was slurred. Just prior to this incident, she was normal and can move all extremities normally. She states this lasted about 1-2 minutes. Following that she was able to move all extremities normally and speak normally. She drove to her PCP who called EMS and she was transported here. She's had no further symptoms. She admits to not taking ASA daily, but did take 650 mg this AM after her symptoms started. Her symptoms were resolved at the time of bedside neurological evaluation. She was LKW 3.22.2017 at 09:30 am. Patient was not administered IV t-PA secondary to symptoms resolved. She was admitted for further evaluation and treatment. Patient qualified for and was enrolled in the POINT trial ( aspirin and Plavix him back to aspirin and placebo ). Transthoracic echo showed normal ejection fraction. Carotid ultrasound showed no significant extra cans stenosis. LDL cholesterol was elevated at 139. Hemoglobin A1c was normal. Patient states she's done well since discharge. She is tolerating study medication without significant bruising or bleeding. She complains of occasional trembling of her hands particularly when she is tired but this is  not significant or bothersome. She did not tolerate Crestor due to myalgias and primary care physician switched her to protocol which so far she seems to be tolerating. He does complain of some anxiety. She's had no recurrent stroke or TIA symptoms. She states her blood pressure is well controlled and today it is slightly high at 160/85. Update 08/04/2016 : She returns for follow-up last visit 3 months ago. She continues to do well. She has finished participation in the point trial and is now on aspirin 25 mg daily which is tolerating well with only minor bruising. She's had no bleeding. She states her blood pressure is quite well controlled and today in the office it is borderline at 137/71. She states she is tolerating Pravachol well without any muscle aches or pains. She did have lipid profile checked 2 months ago by her primary physician for satisfactory. She has been under significant stress in the last few months as her granddaughter had 8 surgeries at [facility] for revision of shunt for hydrocephalus and had seizures and stroke. Patient is currently on Lexapro and feels that it does help much  But she could do better and would like to try higher dose to she if she can tolerate it.  ROS:   14 system review of systems is positive for  easy bruising and bleeding only and all other systems negative. PMH:  Past Medical History:  Diagnosis Date  . GERD (gastroesophageal reflux disease)   . HTN (hypertension)   . Hyperlipidemia   . Stroke [facility])  Social History:  Social History   Social History  . Marital status: Widowed    Spouse name: N/A  . Number of children: 2  . Years of education: 12   Occupational History  . Not on file.   Social History Main Topics  . Smoking status: Never Smoker  . Smokeless tobacco: Never Used  . Alcohol use No  . Drug use: No  . Sexual activity: Not on file   Other Topics Concern  . Not on file   Social History Narrative   Lives alone   Caffeine use:  Soda/tea daily   Very little coffee    Medications:   Current Outpatient Prescriptions on File Prior to Visit  Medication Sig Dispense Refill  . aspirin 325 MG tablet Take 1 tablet (325 mg total) by mouth daily.    . cholecalciferol (VITAMIN D) 1000 units tablet Take by mouth daily.     . Cyanocobalamin (VITAMIN B 12 PO) Take 1 tablet by mouth daily.    Marland Kitchen escitalopram (LEXAPRO) 10 MG tablet Take 10 mg by mouth daily.    . famotidine (PEPCID) 20 MG tablet Take 1 tablet (20 mg total) by mouth 2 (two) times daily. 60 tablet 0  . olmesartan-hydrochlorothiazide (BENICAR HCT) 40-25 MG tablet Take 1 tablet by mouth daily.    . pravastatin (PRAVACHOL) 40 MG tablet Take 40 mg by mouth daily.     No current facility-administered medications on file prior to visit.     Allergies:   Allergies  Allergen Reactions  . Codeine Other (See Comments)  . Prednisone Other (See Comments)  . Augmentin [Amoxicillin-Pot Clavulanate]     c-diff    Physical Exam General: well developed, well nourished middle aged lady, seated, in no evident distress Head: head normocephalic and atraumatic.  Neck: supple with no carotid or supraclavicular bruits Cardiovascular: regular rate and rhythm, no murmurs Musculoskeletal: no deformity Skin:  no rash/petichiae Vascular:  Normal pulses all extremities Vitals:   08/04/16 1016  BP: 137/71  Pulse: 71   Neurologic Exam Mental Status: Awake and fully alert. Oriented to place and time. Recent and remote memory intact. Attention span, concentration and fund of knowledge appropriate. Mood and affect appropriate.  Cranial Nerves: Fundoscopic exam reveals sharp disc margins. Pupils equal, briskly reactive to light. Extraocular movements full without nystagmus. Visual fields full to confrontation. Hearing intact. Facial sensation intact. Face, tongue, palate moves normally and symmetrically.  Motor: Normal bulk and tone. Normal strength in all tested extremity  muscles. Sensory.: intact to touch ,pinprick .position and vibratory sensation.  Coordination: Rapid alternating movements normal in all extremities. Finger-to-nose and heel-to-shin performed accurately bilaterally. Gait and Station: Arises from chair without difficulty. Stance is normal. Gait demonstrates normal stride length and balance . Able to heel, toe and tandem walk without difficulty.  Reflexes: 1+ and symmetric. Toes downgoing.      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 She is enrolled in the POINT  trial    PLAN: I had a long d/w patient about his recent stroke, risk for recurrent stroke/TIAs, personally independently reviewed imaging studies and stroke evaluation results and answered questions.Continue aspirin 325 mg daily  for secondary stroke prevention and maintain strict control of hypertension with blood pressure goal below 130/90, diabetes with hemoglobin A1c goal below 6.5% and lipids with LDL cholesterol goal below 70 mg/dL. I also advised the patient to eat a healthy  diet with plenty of whole grains, cereals, fruits and vegetables, exercise regularly and maintain ideal body weight. I encouraged her to increase participation in activities for stress relaxation like meditation, yoga and regular exercise. Consider increasing Celexa dose to 20 mg daily to help with her anxiety. Followup in the future with stroke nurse lactation her in 6 months or call earlier if necessary. Greater than 50% of time during this 25 minute visit was spent on counseling,explanation of diagnosis, planning of further management, discussion with patient and family and coordination of care Antony Contras, MD  [facility] [address] [place], [place] [address]  Phone [phone] Fax [phone] Note: This document was prepared with digital dictation and possible  smart phrase technology. Any transcriptional errors that result from this process are unintentional

## 2016-08-04 NOTE — Patient Instructions (Signed)
I had a long d/w patient about his recent stroke, risk for recurrent stroke/TIAs, personally independently reviewed imaging studies and stroke evaluation results and answered questions.Continue aspirin 325 mg daily  for secondary stroke prevention and maintain strict control of hypertension with blood pressure goal below 130/90, diabetes with hemoglobin A1c goal below 6.5% and lipids with LDL cholesterol goal below 70 mg/dL. I also advised the patient to eat a healthy diet with plenty of whole grains, cereals, fruits and vegetables, exercise regularly and maintain ideal body weight. I encouraged her to increase participation in activities for stress relaxation like meditation, yoga and regular exercise. Consider increasing Celexa dose to 20 mg daily to help with her anxiety. Followup in the future with stroke nurse lactation her in 6 months or call earlier if necessary.

## 2016-11-27 DIAGNOSIS — J069 Acute upper respiratory infection, unspecified: Secondary | ICD-10-CM | POA: Insufficient documentation

## 2016-12-16 ENCOUNTER — Other Ambulatory Visit (HOSPITAL_COMMUNITY): Payer: Self-pay | Admitting: Family Medicine

## 2016-12-16 ENCOUNTER — Ambulatory Visit (HOSPITAL_COMMUNITY)
Admission: RE | Admit: 2016-12-16 | Discharge: 2016-12-16 | Disposition: A | Discharge status: Home / Self Care | Payer: Medicare Other | Source: Ambulatory Visit | Attending: Family Medicine | Admitting: Family Medicine

## 2016-12-16 DIAGNOSIS — J209 Acute bronchitis, unspecified: Secondary | ICD-10-CM

## 2016-12-16 DIAGNOSIS — J984 Other disorders of lung: Secondary | ICD-10-CM | POA: Diagnosis not present

## 2017-02-03 ENCOUNTER — Ambulatory Visit: Payer: Medicare Other | Admitting: Nurse Practitioner

## 2017-03-11 ENCOUNTER — Ambulatory Visit: Payer: Medicare Other | Admitting: Nurse Practitioner

## 2017-04-16 ENCOUNTER — Encounter: Payer: Self-pay | Admitting: Nurse Practitioner

## 2017-04-16 ENCOUNTER — Encounter (INDEPENDENT_AMBULATORY_CARE_PROVIDER_SITE_OTHER): Payer: Self-pay

## 2017-04-16 ENCOUNTER — Ambulatory Visit (INDEPENDENT_AMBULATORY_CARE_PROVIDER_SITE_OTHER): Payer: Medicare Other | Admitting: Nurse Practitioner

## 2017-04-16 VITALS — BP 148/76 | HR 65 | Ht 66.0 in | Wt 289.8 lb

## 2017-04-16 DIAGNOSIS — G459 Transient cerebral ischemic attack, unspecified: Secondary | ICD-10-CM

## 2017-04-16 DIAGNOSIS — E785 Hyperlipidemia, unspecified: Secondary | ICD-10-CM

## 2017-04-16 DIAGNOSIS — I10 Essential (primary) hypertension: Secondary | ICD-10-CM

## 2017-04-16 NOTE — Patient Instructions (Addendum)
Stressed the importance of management of risk factors to prevent further stroke Continue Aspirin for secondary stroke prevention Maintain strict control of hypertension with blood pressure goal below 130/90, today's reading 148/76 continue antihypertensive medications Cholesterol with LDL cholesterol less than 80 followed by primary care,  most recent 108 may want to switch statin drugs discuss with Dr. Edrick Oh Exercise by walking,  eat healthy diet with whole grains,  fresh fruits and vegetables recommend Mediterranean diet No further stroke or TIA symptoms since March 2007 Dismiss from stroke clinic Stroke Prevention Some medical conditions and behaviors are associated with an increased chance of having a stroke. You may prevent a stroke by making healthy choices and managing medical conditions. How can I reduce my risk of having a stroke?  Stay physically active. Get at least 30 minutes of activity on most or all days.  Do not smoke. It may also be helpful to avoid exposure to secondhand smoke.  Limit alcohol use. Moderate alcohol use is considered to be:  No more than 2 drinks per day for men.  No more than 1 drink per day for nonpregnant women.  Eat healthy foods. This involves:  Eating 5 or more servings of fruits and vegetables a day.  Making dietary changes that address high blood pressure (hypertension), high cholesterol, diabetes, or obesity.  Manage your cholesterol levels.  Making food choices that are high in fiber and low in saturated fat, trans fat, and cholesterol may control cholesterol levels.  Take any prescribed medicines to control cholesterol as directed by your health care provider.  Manage your diabetes.  Controlling your carbohydrate and sugar intake is recommended to manage diabetes.  Take any prescribed medicines to control diabetes as directed by your health care provider.  Control your hypertension.  Making food choices that are low in salt  (sodium), saturated fat, trans fat, and cholesterol is recommended to manage hypertension.  Ask your health care provider if you need treatment to lower your blood pressure. Take any prescribed medicines to control hypertension as directed by your health care provider.  If you are 64-34 years of age, have your blood pressure checked every 3-5 years. If you are 27 years of age or older, have your blood pressure checked every year.  Maintain a healthy weight.  Reducing calorie intake and making food choices that are low in sodium, saturated fat, trans fat, and cholesterol are recommended to manage weight.  Stop drug abuse.  Avoid taking birth control pills.  Talk to your health care provider about the risks of taking birth control pills if you are over 46 years old, smoke, get migraines, or have ever had a blood clot.  Get evaluated for sleep disorders (sleep apnea).  Talk to your health care provider about getting a sleep evaluation if you snore a lot or have excessive sleepiness.  Take medicines only as directed by your health care provider.  For some people, aspirin or blood thinners (anticoagulants) are helpful in reducing the risk of forming abnormal blood clots that can lead to stroke. If you have the irregular heart rhythm of atrial fibrillation, you should be on a blood thinner unless there is a good reason you cannot take them.  Understand all your medicine instructions.  Make sure that other conditions (such as anemia or atherosclerosis) are addressed. Get help right away if:  You have sudden weakness or numbness of the face, arm, or leg, especially on one side of the body.  Your face or eyelid droops  to one side.  You have sudden confusion.  You have trouble speaking (aphasia) or understanding.  You have sudden trouble seeing in one or both eyes.  You have sudden trouble walking.  You have dizziness.  You have a loss of balance or coordination.  You have a  sudden, severe headache with no known cause.  You have new chest pain or an irregular heartbeat. Any of these symptoms may represent a serious problem that is an emergency. Do not wait to see if the symptoms will go away. Get medical help at once. Call your local emergency services (911 in [place] Do not drive yourself to the hospital. This information is not intended to replace advice given to you by your health care provider. Make sure you discuss any questions you have with your health care provider. Document Released: 12/31/2004 Document Revised: 04/30/2016 Document Reviewed: 05/26/2013 Elsevier Interactive Patient Education  2017 [place] refers to food and lifestyle choices that are based on the traditions of countries located on the [employer]. This way of eating has been shown to help prevent certain conditions and improve outcomes for people who have chronic diseases, like kidney disease and heart disease. What are tips for following this plan? Lifestyle   Cook and eat meals together with your family, when possible.  Drink enough fluid to keep your urine clear or pale yellow.  Be physically active every day. This includes:  Aerobic exercise like running or swimming.  Leisure activities like gardening, walking, or housework.  Get 7-8 hours of sleep each night.  If recommended by your health care provider, drink red wine in moderation. This means 1 glass a day for nonpregnant women and 2 glasses a day for men. A glass of wine equals 5 oz (150 mL). Reading food labels   Check the serving size of packaged foods. For foods such as rice and pasta, the serving size refers to the amount of cooked product, not dry.  Check the total fat in packaged foods. Avoid foods that have saturated fat or trans fats.  Check the ingredients list for added sugars, such as corn syrup. Shopping   At the grocery store, buy most of your food from  the areas near the walls of the store. This includes:  Fresh fruits and vegetables (produce).  Grains, beans, nuts, and seeds. Some of these may be available in unpackaged forms or large amounts (in bulk).  Fresh seafood.  Poultry and eggs.  Low-fat dairy products.  Buy whole ingredients instead of prepackaged foods.  Buy fresh fruits and vegetables in-season from local farmers markets.  Buy frozen fruits and vegetables in resealable bags.  If you do not have access to quality fresh seafood, buy precooked frozen shrimp or canned fish, such as tuna, salmon, or sardines.  Buy small amounts of raw or cooked vegetables, salads, or olives from the deli or salad bar at your store.  Stock your pantry so you always have certain foods on hand, such as olive oil, canned tuna, canned tomatoes, rice, pasta, and beans. Cooking   Cook foods with extra-virgin olive oil instead of using butter or other vegetable oils.  Have meat as a side dish, and have vegetables or grains as your main dish. This means having meat in small portions or adding small amounts of meat to foods like pasta or stew.  Use beans or vegetables instead of meat in common dishes like chili or lasagna.  Experiment with different  cooking methods. Try roasting or broiling vegetables instead of steaming or sauteing them.  Add frozen vegetables to soups, stews, pasta, or rice.  Add nuts or seeds for added healthy fat at each meal. You can add these to yogurt, salads, or vegetable dishes.  Marinate fish or vegetables using olive oil, lemon juice, garlic, and fresh herbs. Meal planning   Plan to eat 1 vegetarian meal one day each week. Try to work up to 2 vegetarian meals, if possible.  Eat seafood 2 or more times a week.  Have healthy snacks readily available, such as:  Vegetable sticks with hummus.  Greek yogurt.  Fruit and nut trail mix.  Eat balanced meals throughout the week. This includes:  Fruit: 2-3  servings a day  Vegetables: 4-5 servings a day  Low-fat dairy: 2 servings a day  Fish, poultry, or lean meat: 1 serving a day  Beans and legumes: 2 or more servings a week  Nuts and seeds: 1-2 servings a day  Whole grains: 6-8 servings a day  Extra-virgin olive oil: 3-4 servings a day  Limit red meat and sweets to only a few servings a month What are my food choices?  Mediterranean diet  Recommended  Grains: Whole-grain pasta. Brown rice. Bulgar wheat. Polenta. Couscous. Whole-wheat bread. [place].  Vegetables: Artichokes. Beets. Broccoli. Cabbage. Carrots. Eggplant. Green beans. Chard. Kale. Spinach. Onions. Leeks. Peas. Squash. Tomatoes. Peppers. Radishes.  Fruits: Apples. Apricots. Avocado. Berries. Bananas. Cherries. Dates. Figs. Grapes. Lemons. Melon. Oranges. Peaches. Plums. Pomegranate.  Meats and other protein foods: Beans. Almonds. Sunflower seeds. Pine nuts. Peanuts. [place]. Salmon. Scallops. Shrimp. [place]. Tilapia. Clams. Oysters. Eggs.  Dairy: Low-fat milk. Cheese. Greek yogurt.  Beverages: Water. Red wine. Herbal tea.  Fats and oils: Extra virgin olive oil. Avocado oil. Grape seed oil.  Sweets and desserts: [place] yogurt with honey. Baked apples. Poached pears. Trail mix.  Seasoning and other foods: Basil. Cilantro. Coriander. Cumin. Mint. Parsley. Sage. Rosemary. Tarragon. Garlic. Oregano. Thyme. Pepper. Balsalmic vinegar. Tahini. Hummus. Tomato sauce. Olives. Mushrooms.  Limit these  Grains: Prepackaged pasta or rice dishes. Prepackaged cereal with added sugar.  Vegetables: Deep fried potatoes (french fries).  Fruits: Fruit canned in syrup.  Meats and other protein foods: Beef. Pork. Lamb. Poultry with skin. Hot dogs. Berniece Salines.  Dairy: Ice cream. Sour cream. Whole milk.  Beverages: Juice. Sugar-sweetened soft drinks. Beer. Liquor and spirits.  Fats and oils: Butter. Canola oil. Vegetable oil. Beef fat (tallow). Lard.  Sweets and desserts:  Cookies. Cakes. Pies. Candy.  Seasoning and other foods: Mayonnaise. Premade sauces and marinades.  The items listed may not be a complete list. Talk with your dietitian about what dietary choices are right for you. Summary  The Mediterranean diet includes both food and lifestyle choices.  Eat a variety of fresh fruits and vegetables, beans, nuts, seeds, and whole grains.  Limit the amount of red meat and sweets that you eat.  Talk with your health care provider about whether it is safe for you to drink red wine in moderation. This means 1 glass a day for nonpregnant women and 2 glasses a day for men. A glass of wine equals 5 oz (150 mL). This information is not intended to replace advice given to you by your health care provider. Make sure you discuss any questions you have with your health care provider. Document Released: 07/16/2016 Document Revised: 08/18/2016 Document Reviewed: 07/16/2016 Elsevier Interactive Patient Education  2017 [employer].

## 2017-04-16 NOTE — Progress Notes (Signed)
[facility]  PATIENT: [name] Collier DOB: May 27, 1946   REASON FOR VISIT: follow up for stroke HISTORY [place] visit  6/5/2017PS : 63 year lady seen today for first office follow-up visit following hospital admission for TIA in March 2017.[name] Collier is an 71 y.o. female who presents after an episode of slurred speech and left arm weakness. She states that 9:30 this morning she heard the phone ringing and noticed that she couldn't move her left arm to pick up the phone. She used her right arm to pick up the phone and noted that she couldn't talk. She knew that she went to say hello but she couldn't get her words out. She states shortly after that she was able to say hello but it was slurred. Just prior to this incident, she was normal and can move all extremities normally. She states this lasted about 1-2 minutes. Following that she was able to move all extremities normally and speak normally. She drove to her PCP who called EMS and she was transported here. She's had no further symptoms. She admits to not taking ASA daily, but did take 650 mg this AM after her symptoms started. Her symptoms were resolved at the time of bedside neurological evaluation. She was LKW 3.22.2017 at 09:30 am. Patient was not administered IV t-PA secondary to symptoms resolved. She was admitted for further evaluation and treatment. Patient qualified for and was enrolled in the POINT trial ( aspirin and Plavix him back to aspirin and placebo ). Transthoracic echo showed normal ejection fraction. Carotid ultrasound showed no significant extra cans stenosis. LDL cholesterol was elevated at 139. Hemoglobin A1c was normal. Patient states she's done well since discharge. She is tolerating study medication without significant bruising or bleeding. She complains of occasional trembling of her hands particularly when she is tired but this is not significant or bothersome. She  did not tolerate Crestor due to myalgias and primary care physician switched her to protocol which so far she seems to be tolerating. He does complain of some anxiety. She's had no recurrent stroke or TIA symptoms. She states her blood pressure is well controlled and today it is slightly high at 160/85. Update 8/29/2017PS : She returns for follow-up last visit 3 months ago. She continues to do well. She has finished participation in the point trial and is now on aspirin 25 mg daily which is tolerating well with only minor bruising. She's had no bleeding. She states her blood pressure is quite well controlled and today in the office it is borderline at 137/71. She states she is tolerating Pravachol well without any muscle aches or pains. She did have lipid profile checked 2 months ago by her primary physician for satisfactory. She has been under significant stress in the last few months as her granddaughter had 8 surgeries at [facility] for revision of shunt for hydrocephalus and had seizures and stroke. Patient is currently on Lexapro and feels that it does help much  But she could do better and would like to try higher dose to she if she can tolerate it. UPDATE 05/11/2018CM [name], 71 year old female returns for follow-up with history of hospital admission for TIA in March 2017. She is currently on aspirin 325 daily for secondary stroke prevention without further stroke or TIA symptoms since that time. She has minimal bruising and no bleeding. Blood pressure in the office today 148/76 however she did not take her blood pressure medicines morning which  has a diuretic. She has stopped her Pravachol since last seen due to side effects. Her last LDL on 10/19/2016 was 108 .She is  to follow-up with Dr. Edrick Oh  her primary care regarding this. She gets little exercise due to knee pain bilaterally. She is due to get injections. She really does not follow a low-fat diet and states it is hard to cook for one person. She  takes Lexapro for anxiety depression with good benefit She returns for reevaluation  REVIEW OF SYSTEMS: Full 14 system review of systems performed and notable only for those listed, all others are neg:  Constitutional: neg  Cardiovascular: neg Ear/Nose/Throat: neg  Skin: neg Eyes: neg Respiratory: Shortness of breath with exertion Gastroitestinal: neg  Hematology/Lymphatic: neg  Endocrine: neg Musculoskeletal:neg Allergy/Immunology: neg Neurological: neg Psychiatric: Depression Sleep : neg   ALLERGIES: Allergies  Allergen Reactions  . Codeine Other (See Comments)  . Prednisone Other (See Comments)  . Augmentin [Amoxicillin-Pot Clavulanate]     c-diff    HOME MEDICATIONS: Outpatient Medications Prior to Visit  Medication Sig Dispense Refill  . aspirin 325 MG tablet Take 1 tablet (325 mg total) by mouth daily.    . cholecalciferol (VITAMIN D) 1000 units tablet Take 5,000 Units by mouth daily.     . Cyanocobalamin (VITAMIN B 12 PO) Take 1,000 mcg by mouth daily.     Marland Kitchen escitalopram (LEXAPRO) 10 MG tablet Take 10 mg by mouth daily.    . famotidine (PEPCID) 20 MG tablet Take 1 tablet (20 mg total) by mouth 2 (two) times daily. 60 tablet 0  . olmesartan-hydrochlorothiazide (BENICAR HCT) 40-25 MG tablet Take 1 tablet by mouth daily.    . pravastatin (PRAVACHOL) 40 MG tablet Take 40 mg by mouth daily.     No facility-administered medications prior to visit.     PAST MEDICAL HISTORY: Past Medical History:  Diagnosis Date  . GERD (gastroesophageal reflux disease)   . HTN (hypertension)   . Hyperlipidemia   . Stroke [facility])     PAST SURGICAL HISTORY: Past Surgical History:  Procedure Laterality Date  . KNEE SURGERY Bilateral    Torn miniscus  . rectacil    . TONSILLECTOMY    . VAGINAL HYSTERECTOMY      FAMILY HISTORY: Family History  Problem Relation Age of Onset  . Hypertension Father   . Hyperlipidemia Father   . Hypertension Mother   . Hyperlipidemia Mother     . Stroke Son   . Stroke Maternal Uncle   . Stroke Grandchild     SOCIAL HISTORY: Social History   Social History  . Marital status: Widowed    Spouse name: N/A  . Number of children: 2  . Years of education: 12   Occupational History  . Not on file.   Social History Main Topics  . Smoking status: Never Smoker  . Smokeless tobacco: Never Used  . Alcohol use No  . Drug use: No  . Sexual activity: Not on file   Other Topics Concern  . Not on file   Social History Narrative   Lives alone   Caffeine use: Soda/tea daily   Very little coffee     PHYSICAL EXAM  Vitals:   04/16/17 1009  BP: (!) 148/76  Pulse: 65  Weight: 289 lb 12.8 oz (131.5 kg)  Height: 5\' 6"  (1.676 m)   Body mass index is 46.77 kg/m.  Generalized: Well developed, Obese female in no acute distress  Head: normocephalic and atraumatic,. Oropharynx  benign  Neck: Supple, no carotid bruits  Cardiac: Regular rate rhythm, no murmur  Musculoskeletal: No deformity   Neurological examination   Mentation: Alert oriented to time, place, history taking. Attention span and concentration appropriate. Recent and remote memory intact.  Follows all commands speech and language fluent.   Cranial nerve II-XII:.Pupils were equal round reactive to light extraocular movements were full, visual field were full on confrontational test. Facial sensation and strength were normal. hearing was intact to finger rubbing bilaterally. Uvula tongue midline. head turning and shoulder shrug were normal and symmetric.Tongue protrusion into cheek strength was normal. Motor: normal bulk and tone, full strength in the BUE, BLE, fine finger movements normal, no pronator drift. No focal weakness Sensory: normal and symmetric to light touch, pinprick, and  Vibration in the upper and lower extremities  Coordination: finger-nose-finger, heel-to-shin bilaterally, no dysmetria Reflexes: 1+ upper lower and symmetric plantar responses were  flexor bilaterally. Gait and Station: Rising up from seated position without assistance, normal stance,  moderate stride, good arm swing, smooth turning, able to perform tiptoe, and heel walking without difficulty. Tandem gait is steady  DIAGNOSTIC DATA (LABS, IMAGING, TESTING) - I reviewed patient records, labs, notes, testing and imaging myself where available.  Lab Results  Component Value Date   WBC 7.7 02/26/2016   HGB 10.7 (L) 02/26/2016   HCT 34.8 (L) 02/26/2016   MCV 87.9 02/26/2016   PLT 287 02/26/2016      Component Value Date/Time   NA 141 02/26/2016 1320   K 4.4 02/26/2016 1320   CL 106 02/26/2016 1320   CO2 26 02/26/2016 1320   GLUCOSE 107 (H) 02/26/2016 1320   BUN 12 02/26/2016 1320   CREATININE 0.74 02/26/2016 1320   CALCIUM 9.1 02/26/2016 1320   PROT 6.8 02/26/2016 1320   ALBUMIN 3.2 (L) 02/26/2016 1320   AST 34 02/26/2016 1320   ALT 30 02/26/2016 1320   ALKPHOS 66 02/26/2016 1320   BILITOT 0.9 02/26/2016 1320   GFRNONAA >60 02/26/2016 1320   GFRAA >60 02/26/2016 1320   Lab Results  Component Value Date   CHOL 202 (H) 02/27/2016   HDL 44 02/27/2016   LDLCALC 139 (H) 02/27/2016   TRIG 97 02/27/2016   CHOLHDL 4.6 02/27/2016   Lab Results  Component Value Date   HGBA1C 5.9 (H) 02/26/2016   No results found for: [id number] Lab Results  Component Value Date   TSH 4.001 02/27/2016    Reviewed labs 10/19/16 in care everywhere  ASSESSMENT AND PLAN 71year-old lady with TIA in March 2017 due to small vessel disease with vascular risk factors of hypertension and hyperlipidemia, obesity.   Stressed the importance of management of risk factors to prevent further stroke Continue Aspirin for secondary stroke prevention Maintain strict control of hypertension with blood pressure goal below 130/90, today's reading 148/76 continue antihypertensive medications Cholesterol with LDL cholesterol less than 80 followed by primary care,  most recent 108 may want to  switch statin drugs discuss with Dr. Edrick Oh Exercise by walking,  eat healthy diet with whole grains,  fresh fruits and vegetables recommend Mediterranean diet No further stroke or TIA symptoms since March 2007 Dismiss from stroke clinic I spent 25 minutes in total face to face time with the patient more than 50% of which was spent counseling and coordination of care, reviewing test results reviewing medications and discussing and reviewing the diagnosis of stroke and importance of management of risk factors along with instruction on the [place] which  was reviewed with the patient. Dennie Bible, [facility], [facility], APRN  [facility] [address] [place], [place] [address] [phone]

## 2017-04-19 NOTE — Progress Notes (Signed)
I agree with the above plan 

## 2017-11-10 NOTE — Progress Notes (Signed)
Cardiology Office Note   Date:  11/11/2017   ID:  [name] Collier, DOB [birth year], MRN [id number]  PCP:  Dione Housekeeper, MD  Cardiologist:   Minus Breeding, MD  Referring:  Dione Housekeeper, MD  Chief Complaint  Patient presents with  . Shortness of Breath      History of Present Illness: [name] Collier is a 71 y.o. female who was referred by Dr. Edrick Oh for evaluation of dyspnea.   She took some prednisone recently because of gout.  She said that since that time she is felt heaviness in her chest.  She is felt like she was short of breath.  She feels his walking is normal distance on level ground.  Feels like she has to take a deep breath.  She feels like lying down she might have a fullness in her chest.  She has been using 2 pillows which was new.  She does feel a little palpitations in her chest at times.  She did not had any presyncope or syncope.  She is not having any chest pressure but describes a fullness.  She is not having any neck or arm discomfort.  She has had no significant prior cardiac history.  Echo in 2017 at the time of a TIA demonstrated a normal EF with LAE.    She has gained about 50 pounds over a couple of years.  She does have chores of daily living.   Past Medical History:  Diagnosis Date  . GERD (gastroesophageal reflux disease)   . HTN (hypertension)   . Hyperlipidemia   . TIA (transient ischemic attack)     Past Surgical History:  Procedure Laterality Date  . KNEE SURGERY Bilateral    Torn miniscus  . RECTOCELE REPAIR    . TONSILLECTOMY    . VAGINAL HYSTERECTOMY       Current Outpatient Medications  Medication Sig Dispense Refill  . cholecalciferol (VITAMIN D) 1000 units tablet Take 5,000 Units by mouth daily.     . Cyanocobalamin (VITAMIN B 12 PO) Take 1,000 mcg by mouth daily.     Marland Kitchen escitalopram (LEXAPRO) 10 MG tablet Take 10 mg by mouth daily.    Marland Kitchen ezetimibe (ZETIA) 10 MG tablet Take 1 tablet by mouth daily.    . famotidine (PEPCID) 20 MG  tablet Take 1 tablet by mouth daily.    . hydrochlorothiazide (HYDRODIURIL) 25 MG tablet Take 1 tablet by mouth daily.    . metoprolol succinate (TOPROL-XL) 50 MG 24 hr tablet Take 1 tablet by mouth daily.    Marland Kitchen amLODipine (NORVASC) 2.5 MG tablet Take 1 tablet (2.5 mg total) by mouth daily. 90 tablet 3  . pravastatin (PRAVACHOL) 40 MG tablet Take 40 mg by mouth daily.    . rivaroxaban (XARELTO) 20 MG TABS tablet Take 1 tablet (20 mg total) by mouth daily with supper. 30 tablet 11   No current facility-administered medications for this visit.     Allergies:   Codeine; Prednisone; and Augmentin [amoxicillin-pot clavulanate]    Social History:  The patient  reports that  has never smoked. she has never used smokeless tobacco. She reports that she does not drink alcohol or use drugs.   Family History:  The patient's family history includes Hyperlipidemia in her mother; Hypertension in her mother; Neurologic Disorder (age of onset: 27) in her mother; Prostate cancer in her brother; Stroke in her grandchild, maternal uncle, and son.    ROS:  Please see the history of present illness.  Otherwise, review of systems are positive for none.   All other systems are reviewed and negative.    PHYSICAL EXAM: VS:  BP (!) 162/90   Pulse (!) 55   Ht 5' 6"  (1.676 m)   Wt 284 lb (128.8 kg)   BMI 45.84 kg/m  , BMI Body mass index is 45.84 kg/m. GENERAL:  Well appearing HEENT:  Pupils equal round and reactive, fundi not visualized, oral mucosa unremarkable NECK:  No jugular venous distention, waveform within normal limits, carotid upstroke brisk and symmetric, no bruits, no thyromegaly LYMPHATICS:  No cervical, inguinal adenopathy LUNGS:  Clear to auscultation bilaterally BACK:  No CVA tenderness CHEST:  Unremarkable HEART:  PMI not displaced or sustained,S1 and S2 within normal limits, no S3,  no clicks, no rubs, no murmurs, irregular ABD:  Flat, positive bowel sounds normal in frequency in pitch, no  bruits, no rebound, no guarding, no midline pulsatile mass, no hepatomegaly, no splenomegaly EXT:  2 plus pulses throughout, no edema, no cyanosis no clubbing SKIN:  No rashes no nodules NEURO:  Cranial nerves II through XII grossly intact, motor grossly intact throughout PSYCH:  Cognitively intact, oriented to person place and time    EKG:  EKG is ordered today. The ekg ordered today demonstrates atrial fibrillation, rate 55, axis within normal limits, intervals within normal limits, poor anterior R wave progression   Recent Labs: No results found for requested labs within last 8760 hours.    Lipid Panel    Component Value Date/Time   CHOL 202 (H) 02/27/2016 0519   TRIG 97 02/27/2016 0519   HDL 44 02/27/2016 0519   CHOLHDL 4.6 02/27/2016 0519   VLDL 19 02/27/2016 0519   LDLCALC 139 (H) 02/27/2016 0519      Wt Readings from Last 3 Encounters:  11/11/17 284 lb (128.8 kg)  04/16/17 289 lb 12.8 oz (131.5 kg)  08/04/16 283 lb 6.4 oz (128.5 kg)      Other studies Reviewed: Additional studies/ records that were reviewed today include: Labs from Dione Housekeeper, MD. Review of the above records demonstrates:  Please see elsewhere in the note.     ASSESSMENT AND PLAN:   HTN:  :  The BP is not well controlled.  I will start Norvasc 2.5 mg daily.    DYSPNEA:  This is likely related to the atrial fib.  I will check a BNP.    ATRIAL FIB: This is a new diagnosis.  This is probably causing her symptoms.  I am going to check a TSH.  I will check an echocardiogram.  I am going to check a 24-hour Holter to see if this is permanent.  He is going to stop aspirin.  She is going to start Xarelto 20 mg.  She has had a normal CBC and be met recently.  She has no bleeding contraindications.  We will have her review this with our pharmacist as a new medication.  After the above testing I will see her back and consider cardioversion.  Of note it is very likely she had atrial fibrillation as a  cause of her TIA in the past but this was never documented and this is not been seen before.  Ms. Danine Hor has a CHA2DS2 - VASc score of 4.    Current medicines are reviewed at length with the patient today.  The patient does not have concerns regarding medicines.  The following changes have been made:  As above  Labs/ tests ordered today  include: Persistent  Orders Placed This Encounter  Procedures  . TSH  . B Nat Peptide  . HOLTER MONITOR - 24 HOUR  . EKG 12-Lead  . ECHOCARDIOGRAM COMPLETE     Disposition:   FU with two weeks in [place].      Signed, Minus Breeding, MD  11/11/2017 9:15 AM    [place]

## 2017-11-11 ENCOUNTER — Ambulatory Visit: Payer: Medicare Other | Admitting: Cardiology

## 2017-11-11 ENCOUNTER — Ambulatory Visit: Payer: Self-pay | Admitting: Pharmacist

## 2017-11-11 ENCOUNTER — Encounter: Payer: Self-pay | Admitting: Cardiology

## 2017-11-11 VITALS — BP 162/90 | HR 55 | Ht 66.0 in | Wt 284.0 lb

## 2017-11-11 DIAGNOSIS — R0602 Shortness of breath: Secondary | ICD-10-CM | POA: Insufficient documentation

## 2017-11-11 DIAGNOSIS — R5383 Other fatigue: Secondary | ICD-10-CM | POA: Insufficient documentation

## 2017-11-11 DIAGNOSIS — I10 Essential (primary) hypertension: Secondary | ICD-10-CM | POA: Diagnosis not present

## 2017-11-11 DIAGNOSIS — I481 Persistent atrial fibrillation: Secondary | ICD-10-CM

## 2017-11-11 DIAGNOSIS — I4819 Other persistent atrial fibrillation: Secondary | ICD-10-CM | POA: Insufficient documentation

## 2017-11-11 MED ORDER — AMLODIPINE BESYLATE 2.5 MG PO TABS: 2.5000 mg | ORAL_TABLET | Freq: Every day | ORAL | 3 refills | Status: DC

## 2017-11-11 MED ORDER — RIVAROXABAN 20 MG PO TABS: 20.0000 mg | ORAL_TABLET | Freq: Every day | ORAL | 11 refills | Status: DC

## 2017-11-11 NOTE — Patient Instructions (Signed)
Pt was started on Xarelto for Atrial fibrillation on December/05/2017  Reviewed patients medication list.  Pt is not currently on any combined P-gp and strong CYP3A4 inhibitors/inducers (ketoconazole, traconazole, ritonavir, carbamazepine, phenytoin, rifampin, St. John's wort).  Reviewed labs.   Dose appropriate based on CrCl.    A full discussion of the nature of anticoagulants has been carried out.  A benefit/risk analysis has been presented to the patient, so that they understand the justification for choosing anticoagulation with Xarelto at this time.  The need for compliance is stressed.  Pt is aware to take the medication once daily with the largest meal of the day.  Side effects of potential bleeding are discussed, including unusual colored urine or stools, coughing up blood or coffee ground emesis, nose bleeds or serious fall or head trauma.  Discussed signs and symptoms of stroke. The patient should avoid any OTC items containing aspirin or ibuprofen.  Avoid alcohol consumption.   Call if any signs of abnormal bleeding.  Discussed financial obligations and resolved any difficulty in obtaining medication.  Next lab test in 6 months.

## 2017-11-11 NOTE — Patient Instructions (Signed)
Medication Instructions:  STOP- Aspirin START- Xarelto 20 mg daily START- Amlodipine 2.5 mg daily  If you need a refill on your cardiac medications before your next appointment, please call your pharmacy.  Labwork: TSH and BNP HERE IN OUR OFFICE AT [facility]  Take the provided lab slips for you to take with you to the lab for you blood draw.    You will NOT need to fast   You may go to any [facility] lab that is convenient for you however, we do have a lab in our office that is able to assist you. You do NOT need an appointment for our lab. Once in our office lobby there is a podium to the right of the check-in desk where you are to sign-in and ring a doorbell to alert [place] you are here. Lab is open Monday-Friday from 8:00am to 4:00pm; and is closed for lunch from 12:45p-1:45pm   Testing/Procedures: Your physician has requested that you have an echocardiogram. Echocardiography is a painless test that uses sound waves to create images of your heart. It provides your doctor with information about the size and shape of your heart and how well your heart's chambers and valves are working. This procedure takes approximately one hour. There are no restrictions for this procedure.  Your physician has recommended that you wear a 24 hour holter monitor. Holter monitors are medical devices that record the heart's electrical activity. Doctors most often use these monitors to diagnose arrhythmias. Arrhythmias are problems with the speed or rhythm of the heartbeat. The monitor is a small, portable device. You can wear one while you do your normal daily activities. This is usually used to diagnose what is causing palpitations/syncope (passing out).   Follow-Up: Your physician wants you to follow-up in: 2 Weeks in [place].    Thank you for choosing [facility]!!

## 2017-11-12 LAB — TSH: TSH: 3.82 u[IU]/mL (ref 0.450–4.500)

## 2017-11-12 LAB — BRAIN NATRIURETIC PEPTIDE: BNP: 469.6 pg/mL — AB (ref 0.0–100.0)

## 2017-11-16 ENCOUNTER — Other Ambulatory Visit (HOSPITAL_COMMUNITY): Payer: Medicare Other

## 2017-11-17 ENCOUNTER — Ambulatory Visit (INDEPENDENT_AMBULATORY_CARE_PROVIDER_SITE_OTHER): Payer: Medicare Other

## 2017-11-17 ENCOUNTER — Ambulatory Visit (HOSPITAL_COMMUNITY): Payer: Medicare Other | Attending: Cardiology

## 2017-11-17 ENCOUNTER — Other Ambulatory Visit: Payer: Self-pay

## 2017-11-17 DIAGNOSIS — I481 Persistent atrial fibrillation: Secondary | ICD-10-CM

## 2017-11-17 DIAGNOSIS — I4891 Unspecified atrial fibrillation: Secondary | ICD-10-CM | POA: Diagnosis not present

## 2017-11-17 DIAGNOSIS — R0602 Shortness of breath: Secondary | ICD-10-CM | POA: Insufficient documentation

## 2017-11-17 DIAGNOSIS — I10 Essential (primary) hypertension: Secondary | ICD-10-CM | POA: Diagnosis not present

## 2017-11-17 DIAGNOSIS — D649 Anemia, unspecified: Secondary | ICD-10-CM | POA: Insufficient documentation

## 2017-11-17 DIAGNOSIS — G459 Transient cerebral ischemic attack, unspecified: Secondary | ICD-10-CM | POA: Diagnosis not present

## 2017-11-17 DIAGNOSIS — R0600 Dyspnea, unspecified: Secondary | ICD-10-CM | POA: Insufficient documentation

## 2017-11-17 DIAGNOSIS — I4819 Other persistent atrial fibrillation: Secondary | ICD-10-CM

## 2017-11-17 DIAGNOSIS — I081 Rheumatic disorders of both mitral and tricuspid valves: Secondary | ICD-10-CM | POA: Diagnosis not present

## 2017-11-17 DIAGNOSIS — E785 Hyperlipidemia, unspecified: Secondary | ICD-10-CM | POA: Diagnosis not present

## 2017-11-18 ENCOUNTER — Telehealth: Payer: Self-pay | Admitting: *Deleted

## 2017-11-18 NOTE — Telephone Encounter (Signed)
-----   Message from Minus Breeding, MD sent at 11/12/2017  8:00 AM EST ----- TSH OK.  Call Ms. Hazzard with the results and send results to Dione Housekeeper, MD

## 2017-11-18 NOTE — Telephone Encounter (Signed)
Attempted to call the patient with the lab results. No answer and no voicemail was available.

## 2017-11-19 ENCOUNTER — Encounter: Payer: Self-pay | Admitting: Cardiology

## 2017-11-21 NOTE — H&P (View-Only) (Signed)
Cardiology Office Note   Date:  11/24/2017   ID:  [name] Collier, DOB 09/06/1946, MRN [id number]  PCP:  [name] Housekeeper, MD  Cardiologist:   Minus Breeding, MD  Referring:  [name] Housekeeper, MD  No chief complaint on file.     History of Present Illness: [name] Collier is a 71 y.o. female who was referred by Dr. Edrick Collier for evaluation of dyspnea.  She was in atrial fib at the last visit.  TSH that was normal.  However, she had a BNP that was mildly elevated.   Echo demonstrated a normal EF and elevated pulmonary pressure at 69 mm Hg.  Since she was last seen her breathing is somewhat improved.  She can walk to the house without being short of breath.  She is not describing any new PND or orthopnea.  She has had no chest pressure, neck or arm discomfort.  She has had no weight gain or edema.  She is still not back to baseline with her breathing however.   Past Medical History:  Diagnosis Date  . GERD (gastroesophageal reflux disease)   . HTN (hypertension)   . Hyperlipidemia   . TIA (transient ischemic attack)     Past Surgical History:  Procedure Laterality Date  . KNEE SURGERY Bilateral    Torn miniscus  . RECTOCELE REPAIR    . TONSILLECTOMY    . VAGINAL HYSTERECTOMY       Current Outpatient Medications  Medication Sig Dispense Refill  . amLODipine (NORVASC) 2.5 MG tablet Take 1 tablet (2.5 mg total) by mouth daily. 90 tablet 3  . cholecalciferol (VITAMIN D) 1000 units tablet Take 5,000 Units by mouth daily.     . Cyanocobalamin (VITAMIN B 12 PO) Take 1,000 mcg by mouth daily.     Marland Kitchen escitalopram (LEXAPRO) 10 MG tablet Take 10 mg by mouth daily.    Marland Kitchen ezetimibe (ZETIA) 10 MG tablet Take 1 tablet by mouth daily.    . famotidine (PEPCID) 20 MG tablet Take 1 tablet by mouth daily.    . hydrochlorothiazide (HYDRODIURIL) 25 MG tablet Take 1 tablet by mouth daily.    . metoprolol succinate (TOPROL-XL) 50 MG 24 hr tablet Take 1 tablet by mouth daily.    . rivaroxaban (XARELTO) 20  MG TABS tablet Take 1 tablet (20 mg total) by mouth daily with supper. 30 tablet 11   No current facility-administered medications for this visit.     Allergies:   Codeine; Prednisone; and Augmentin [amoxicillin-pot clavulanate]    ROS:  Please see the history of present illness.   Otherwise, review of systems are positive for snoring, daytime somnolence.   All other systems are reviewed and negative.    PHYSICAL EXAM: VS:  BP (!) 141/77   Pulse (!) 56   Ht 5\' 6"  (1.676 m)   Wt 276 lb (125.2 kg)   BMI 44.55 kg/m  , BMI Body mass index is 44.55 kg/m.  GENERAL:  Well appearing NECK:  No jugular venous distention, waveform within normal limits, carotid upstroke brisk and symmetric, no bruits, no thyromegaly LUNGS:  Clear to auscultation bilaterally CHEST:  Unremarkable HEART:  PMI not displaced or sustained,S1 and S2 within normal limits, no S3,  no clicks, no rubs, no murmurs, irregular ABD:  Flat, positive bowel sounds normal in frequency in pitch, no bruits, no rebound, no guarding, no midline pulsatile mass, no hepatomegaly, no splenomegaly EXT:  2 plus pulses throughout, no edema, no cyanosis no clubbing  EKG:  EKG is not ordered today.   Recent Labs: 11/11/2017: BNP 469.6; TSH 3.820    Lipid Panel    Component Value Date/Time   CHOL 202 (H) 02/27/2016 0519   TRIG 97 02/27/2016 0519   HDL 44 02/27/2016 0519   CHOLHDL 4.6 02/27/2016 0519   VLDL 19 02/27/2016 0519   LDLCALC 139 (H) 02/27/2016 0519      Wt Readings from Last 3 Encounters:  11/24/17 276 lb (125.2 kg)  11/11/17 284 lb (128.8 kg)  04/16/17 289 lb 12.8 oz (131.5 kg)      Other studies Reviewed: Additional studies/ records that were reviewed today include: Echo Review of the above records demonstrates:      ASSESSMENT AND PLAN:   HTN: Her blood pressure is slightly elevated.  We talked about weight loss and other therapy as below to manage this.  If it remains elevated I will increase the  Norvasc.   DYSPNEA:    She did have a mildly elevated BNP recently.  Going to give her Lasix 20 mg daily.  Her last potassium and creatinine were okay.  She can increase her potassium containing foods I will check a basic metabolic profile when she presents for cardioversion.  Again a therapy for management of this will be weight loss.   ATRIAL FIB:   [name] Collier has a CHA2DS2 - VASc score of 4.   This is symptomatic.  I will plan cardioversion.  PULMONARY HTN: I will follow this up with repeat echocardiography after management of her atrial fibrillation and probable sleep apnea.  SLEEP APNEA: She has sleep apnea symptoms.  She has daytime somnolence, snoring, dry mouth.  She will have a sleep study.   Current medicines are reviewed at length with the patient today.  The patient does not have concerns regarding medicines.  The following changes have been made:   As above  Labs/ tests ordered today include:   No orders of the defined types were placed in this encounter.    Disposition:   FU with me after the sleep study.     Signed, Minus Breeding, MD  11/24/2017 3:33 PM    [facility]

## 2017-11-21 NOTE — Progress Notes (Signed)
Cardiology Office Note   Date:  11/24/2017   ID:  [name] Collier, DOB Jul 20, 1946, MRN [id number]  PCP:  [name] Housekeeper, MD  Cardiologist:   Minus Breeding, MD  Referring:  [name] Housekeeper, MD  No chief complaint on file.     History of Present Illness: [name] Collier is a 71 y.o. female who was referred by Dr. Edrick Collier for evaluation of dyspnea.  She was in atrial fib at the last visit.  TSH that was normal.  However, she had a BNP that was mildly elevated.   Echo demonstrated a normal EF and elevated pulmonary pressure at 69 mm Hg.  Since she was last seen her breathing is somewhat improved.  She can walk to the house without being short of breath.  She is not describing any new PND or orthopnea.  She has had no chest pressure, neck or arm discomfort.  She has had no weight gain or edema.  She is still not back to baseline with her breathing however.   Past Medical History:  Diagnosis Date  . GERD (gastroesophageal reflux disease)   . HTN (hypertension)   . Hyperlipidemia   . TIA (transient ischemic attack)     Past Surgical History:  Procedure Laterality Date  . KNEE SURGERY Bilateral    Torn miniscus  . RECTOCELE REPAIR    . TONSILLECTOMY    . VAGINAL HYSTERECTOMY       Current Outpatient Medications  Medication Sig Dispense Refill  . amLODipine (NORVASC) 2.5 MG tablet Take 1 tablet (2.5 mg total) by mouth daily. 90 tablet 3  . cholecalciferol (VITAMIN D) 1000 units tablet Take 5,000 Units by mouth daily.     . Cyanocobalamin (VITAMIN B 12 PO) Take 1,000 mcg by mouth daily.     Marland Kitchen escitalopram (LEXAPRO) 10 MG tablet Take 10 mg by mouth daily.    Marland Kitchen ezetimibe (ZETIA) 10 MG tablet Take 1 tablet by mouth daily.    . famotidine (PEPCID) 20 MG tablet Take 1 tablet by mouth daily.    . hydrochlorothiazide (HYDRODIURIL) 25 MG tablet Take 1 tablet by mouth daily.    . metoprolol succinate (TOPROL-XL) 50 MG 24 hr tablet Take 1 tablet by mouth daily.    . rivaroxaban (XARELTO) 20  MG TABS tablet Take 1 tablet (20 mg total) by mouth daily with supper. 30 tablet 11   No current facility-administered medications for this visit.     Allergies:   Codeine; Prednisone; and Augmentin [amoxicillin-pot clavulanate]    ROS:  Please see the history of present illness.   Otherwise, review of systems are positive for snoring, daytime somnolence.   All other systems are reviewed and negative.    PHYSICAL EXAM: VS:  BP (!) 141/77   Pulse (!) 56   Ht 5\' 6"  (1.676 m)   Wt 276 lb (125.2 kg)   BMI 44.55 kg/m  , BMI Body mass index is 44.55 kg/m.  GENERAL:  Well appearing NECK:  No jugular venous distention, waveform within normal limits, carotid upstroke brisk and symmetric, no bruits, no thyromegaly LUNGS:  Clear to auscultation bilaterally CHEST:  Unremarkable HEART:  PMI not displaced or sustained,S1 and S2 within normal limits, no S3,  no clicks, no rubs, no murmurs, irregular ABD:  Flat, positive bowel sounds normal in frequency in pitch, no bruits, no rebound, no guarding, no midline pulsatile mass, no hepatomegaly, no splenomegaly EXT:  2 plus pulses throughout, no edema, no cyanosis no clubbing  EKG:  EKG is not ordered today.   Recent Labs: 11/11/2017: BNP 469.6; TSH 3.820    Lipid Panel    Component Value Date/Time   CHOL 202 (H) 02/27/2016 0519   TRIG 97 02/27/2016 0519   HDL 44 02/27/2016 0519   CHOLHDL 4.6 02/27/2016 0519   VLDL 19 02/27/2016 0519   LDLCALC 139 (H) 02/27/2016 0519      Wt Readings from Last 3 Encounters:  11/24/17 276 lb (125.2 kg)  11/11/17 284 lb (128.8 kg)  04/16/17 289 lb 12.8 oz (131.5 kg)      Other studies Reviewed: Additional studies/ records that were reviewed today include: Echo Review of the above records demonstrates:      ASSESSMENT AND PLAN:   HTN: Her blood pressure is slightly elevated.  We talked about weight loss and other therapy as below to manage this.  If it remains elevated I will increase the  Norvasc.   DYSPNEA:    She did have a mildly elevated BNP recently.  Going to give her Lasix 20 mg daily.  Her last potassium and creatinine were okay.  She can increase her potassium containing foods I will check a basic metabolic profile when she presents for cardioversion.  Again a therapy for management of this will be weight loss.   ATRIAL FIB:   [name] has a CHA2DS2 - VASc score of 4.   This is symptomatic.  I will plan cardioversion.  PULMONARY HTN: I will follow this up with repeat echocardiography after management of her atrial fibrillation and probable sleep apnea.  SLEEP APNEA: She has sleep apnea symptoms.  She has daytime somnolence, snoring, dry mouth.  She will have a sleep study.   Current medicines are reviewed at length with the patient today.  The patient does not have concerns regarding medicines.  The following changes have been made:   As above  Labs/ tests ordered today include:   No orders of the defined types were placed in this encounter.    Disposition:   FU with me after the sleep study.     Signed, Minus Breeding, MD  11/24/2017 3:33 PM    [facility]

## 2017-11-23 ENCOUNTER — Other Ambulatory Visit (HOSPITAL_COMMUNITY): Payer: Medicare Other

## 2017-11-24 ENCOUNTER — Ambulatory Visit: Payer: Medicare Other | Admitting: Cardiology

## 2017-11-24 ENCOUNTER — Encounter: Payer: Self-pay | Admitting: Cardiology

## 2017-11-24 VITALS — BP 141/77 | HR 56 | Ht 66.0 in | Wt 276.0 lb

## 2017-11-24 DIAGNOSIS — R0602 Shortness of breath: Secondary | ICD-10-CM

## 2017-11-24 DIAGNOSIS — I481 Persistent atrial fibrillation: Secondary | ICD-10-CM | POA: Diagnosis not present

## 2017-11-24 DIAGNOSIS — I10 Essential (primary) hypertension: Secondary | ICD-10-CM | POA: Diagnosis not present

## 2017-11-24 DIAGNOSIS — I4819 Other persistent atrial fibrillation: Secondary | ICD-10-CM

## 2017-11-24 DIAGNOSIS — R5383 Other fatigue: Secondary | ICD-10-CM | POA: Diagnosis not present

## 2017-11-24 MED ORDER — FUROSEMIDE 20 MG PO TABS: 20.0000 mg | ORAL_TABLET | Freq: Every day | ORAL | 6 refills | Status: DC

## 2017-11-24 NOTE — Patient Instructions (Addendum)
Medication Instructions:  Start Furosemide 20 mg a day. Make sure to eat foods high in potassium as discussed. Continue all other medications as listed.  Labwork: None  Testing/Procedures: Your physician has requested that you have a Cardioversion.  Electrical Cardioversion uses a jolt of electricity to your heart either through paddles or wired patches attached to your chest. This is a controlled, usually prescheduled, procedure. This procedure is done at the hospital and you are not awake during the procedure. You usually go home the day of the procedure. Please see the instruction sheet given to you today for more information.  Your physician has recommended that you have a sleep study. This test records several body functions during sleep, including: brain activity, eye movement, oxygen and carbon dioxide blood levels, heart rate and rhythm, breathing rate and rhythm, the flow of air through your mouth and nose, snoring, body muscle movements, and chest and belly movement.  Follow-Up: Follow up with Dr Percival Spanish after the above procedure and testing.  If you need a refill on your cardiac medications before your next appointment, please call your pharmacy.  Thank you for choosing [facility]!!    You are scheduled for a Cardioversion on December 14, 2017 with Dr. Percival Spanish.  Please arrive at the [facility] (Main Entrance A) at [facility]: [address] [place], [place] [address] at 10:30 AM am.  DIET: Nothing to eat or drink after midnight except a sip of water with medications (see medication instructions below)  Medication Instructions: OK to hold Furosemide this AM  Continue your anticoagulant: Xarelto You will need to continue your anticoagulant after your procedure until you are told by your  Provider that it is safe to stop  Labs:  Your lab work will be done at the hospital prior to your procedure - you will need to arrive 1  hours ahead of your  procedure  You must have a responsible person to drive you home and stay in the waiting area during your procedure. Failure to do so could result in cancellation.  Bring your insurance cards.  *Special Note: Every effort is made to have your procedure done on time. Occasionally there are emergencies that occur at the hospital that may cause delays. Please be patient if a delay does occur.

## 2017-11-29 LAB — HOLTER MONITOR - 24 HOUR
Ao-asc: 38 cm
CHL CUP DOP CALC LVOT VTI: 27.9 cm
CHL CUP RV SYS PRESS: 69 mmHg
CHL CUP TV REG PEAK VELOCITY: 366 cm/s
FS: 32 % (ref 28–44)
IVS/LV PW RATIO, ED: 0.9
LA ID, A-P, ES: 57 mm
LA diam index: 2.26 cm/m2
LA vol: 78 mL
LAVOLA4C: 83.5 mL
LAVOLIN: 30.9 mL/m2
LDCA: 3.14 cm2
LEFT ATRIUM END SYS DIAM: 57 mm
LV PW d: 12.4 mm — AB (ref 0.6–1.1)
LVOT diameter: 20 mm
LVOT peak grad rest: 5 mmHg
LVOTPV: 112 cm/s
LVOTSV: 88 mL
TRMAXVEL: 366 cm/s

## 2017-12-14 ENCOUNTER — Other Ambulatory Visit: Payer: Self-pay | Admitting: Cardiology

## 2017-12-14 ENCOUNTER — Ambulatory Visit (HOSPITAL_COMMUNITY): Payer: Medicare Other | Admitting: Anesthesiology

## 2017-12-14 ENCOUNTER — Encounter (HOSPITAL_COMMUNITY)
Admission: RE | Disposition: A | Discharge status: Home / Self Care | Payer: Self-pay | Source: Ambulatory Visit | Attending: Cardiology

## 2017-12-14 ENCOUNTER — Other Ambulatory Visit: Payer: Self-pay

## 2017-12-14 ENCOUNTER — Ambulatory Visit (HOSPITAL_COMMUNITY)
Admission: RE | Admit: 2017-12-14 | Discharge: 2017-12-14 | Disposition: A | Discharge status: Home / Self Care | Payer: Medicare Other | Source: Ambulatory Visit | Attending: Cardiology | Admitting: Cardiology

## 2017-12-14 DIAGNOSIS — Z8673 Personal history of transient ischemic attack (TIA), and cerebral infarction without residual deficits: Secondary | ICD-10-CM | POA: Diagnosis not present

## 2017-12-14 DIAGNOSIS — I4891 Unspecified atrial fibrillation: Secondary | ICD-10-CM | POA: Insufficient documentation

## 2017-12-14 DIAGNOSIS — Z79899 Other long term (current) drug therapy: Secondary | ICD-10-CM | POA: Diagnosis not present

## 2017-12-14 DIAGNOSIS — F419 Anxiety disorder, unspecified: Secondary | ICD-10-CM | POA: Diagnosis not present

## 2017-12-14 DIAGNOSIS — Z5309 Procedure and treatment not carried out because of other contraindication: Secondary | ICD-10-CM | POA: Insufficient documentation

## 2017-12-14 DIAGNOSIS — K219 Gastro-esophageal reflux disease without esophagitis: Secondary | ICD-10-CM | POA: Diagnosis not present

## 2017-12-14 DIAGNOSIS — Z6841 Body Mass Index (BMI) 40.0 and over, adult: Secondary | ICD-10-CM | POA: Insufficient documentation

## 2017-12-14 DIAGNOSIS — Z7901 Long term (current) use of anticoagulants: Secondary | ICD-10-CM | POA: Diagnosis not present

## 2017-12-14 DIAGNOSIS — E785 Hyperlipidemia, unspecified: Secondary | ICD-10-CM | POA: Diagnosis not present

## 2017-12-14 DIAGNOSIS — I10 Essential (primary) hypertension: Secondary | ICD-10-CM | POA: Insufficient documentation

## 2017-12-14 LAB — CBC WITH DIFFERENTIAL/PLATELET
Basophils Absolute: 0 10*3/uL (ref 0.0–0.1)
Basophils Relative: 1 %
EOS PCT: 1 %
Eosinophils Absolute: 0.1 10*3/uL (ref 0.0–0.7)
HCT: 42.3 % (ref 36.0–46.0)
HEMOGLOBIN: 13.5 g/dL (ref 12.0–15.0)
LYMPHS ABS: 1.5 10*3/uL (ref 0.7–4.0)
LYMPHS PCT: 22 %
MCH: 28.5 pg (ref 26.0–34.0)
MCHC: 31.9 g/dL (ref 30.0–36.0)
MCV: 89.2 fL (ref 78.0–100.0)
Monocytes Absolute: 0.8 10*3/uL (ref 0.1–1.0)
Monocytes Relative: 12 %
NEUTROS PCT: 64 %
Neutro Abs: 4.4 10*3/uL (ref 1.7–7.7)
PLATELETS: 233 10*3/uL (ref 150–400)
RBC: 4.74 MIL/uL (ref 3.87–5.11)
RDW: 13.1 % (ref 11.5–15.5)
WBC: 6.9 10*3/uL (ref 4.0–10.5)

## 2017-12-14 LAB — BASIC METABOLIC PANEL
ANION GAP: 10 (ref 5–15)
BUN: 20 mg/dL (ref 6–20)
CHLORIDE: 95 mmol/L — AB (ref 101–111)
CO2: 32 mmol/L (ref 22–32)
Calcium: 9.4 mg/dL (ref 8.9–10.3)
Creatinine, Ser: 0.89 mg/dL (ref 0.44–1.00)
GFR calc Af Amer: >60 mL/min (ref >60)
Glucose, Bld: 100 mg/dL — ABNORMAL HIGH (ref 65–99)
POTASSIUM: 2.6 mmol/L — AB (ref 3.5–5.1)
SODIUM: 137 mmol/L (ref 135–145)

## 2017-12-14 SURGERY — CANCELLED PROCEDURE
Anesthesia: General

## 2017-12-14 MED ORDER — SODIUM CHLORIDE 0.9 % IV SOLN
INTRAVENOUS | Status: DC | PRN
  Administered 2017-12-14: 500 mL via INTRAMUSCULAR

## 2017-12-14 MED ORDER — POTASSIUM CHLORIDE ER 20 MEQ PO TBCR: EXTENDED_RELEASE_TABLET | ORAL | 0 refills | Status: DC

## 2017-12-14 MED ORDER — POTASSIUM CHLORIDE CRYS ER 20 MEQ PO TBCR
40.0000 meq | EXTENDED_RELEASE_TABLET | Freq: Once | ORAL | Status: AC
  Administered 2017-12-14: 40 meq via ORAL
  Filled 2017-12-14: qty 2

## 2017-12-14 MED ORDER — POTASSIUM CHLORIDE 10 MEQ/100ML IV SOLN
10.0000 meq | INTRAVENOUS | Status: AC
  Administered 2017-12-14 (×2): 10 meq via INTRAVENOUS
  Filled 2017-12-14 (×2): qty 100

## 2017-12-14 NOTE — Anesthesia Preprocedure Evaluation (Deleted)
Anesthesia Evaluation  Patient identified by MRN, date of birth, ID band Patient awake    Reviewed: Allergy & Precautions, H&P , NPO status , Patient's Chart, lab work & pertinent test results  Airway Mallampati: II  TM Distance: >3 FB Neck ROM: Full    Dental no notable dental hx. (+) Dental Advisory Given, Teeth Intact   Pulmonary neg pulmonary ROS,    Pulmonary exam normal breath sounds clear to auscultation       Cardiovascular hypertension, Pt. on medications and Pt. on home beta blockers + dysrhythmias Atrial Fibrillation  Rhythm:Irregular Rate:Normal     Neuro/Psych Anxiety TIAnegative psych ROS   GI/Hepatic Neg liver ROS, GERD  Medicated,  Endo/Other  Morbid obesity  Renal/GU negative Renal ROS  negative genitourinary   Musculoskeletal   Abdominal   Peds  Hematology negative hematology ROS (+)   Anesthesia Other Findings   Reproductive/Obstetrics negative OB ROS                            Anesthesia Physical Anesthesia Plan  ASA: III  Anesthesia Plan: General   Post-op Pain Management:    Induction: Intravenous  PONV Risk Score and Plan: 3 and Treatment may vary due to age or medical condition  Airway Management Planned: Mask  Additional Equipment:   Intra-op Plan:   Post-operative Plan:   Informed Consent: I have reviewed the patients History and Physical, chart, labs and discussed the procedure including the risks, benefits and alternatives for the proposed anesthesia with the patient or authorized representative who has indicated his/her understanding and acceptance.   Dental advisory given  Plan Discussed with: CRNA  Anesthesia Plan Comments:         Anesthesia Quick Evaluation

## 2017-12-14 NOTE — CV Procedure (Signed)
   Patient presented for a cardioversion but her potassium was 2.6.  She will be given IV potassium and PO and we will repeat a BMET and plan DCCV next week.

## 2017-12-14 NOTE — H&P (View-Only) (Signed)
Procedure canceled due to potassium of 2.6.  Will supplement and discharge home to reschedule.

## 2017-12-14 NOTE — Progress Notes (Signed)
Procedure canceled due to potassium of 2.6.  Will supplement and discharge home to reschedule.

## 2017-12-14 NOTE — Interval H&P Note (Signed)
History and Physical Interval Note:  12/14/2017 12:10 PM  [name] Collier  has presented today for surgery, with the diagnosis of AFIB  The various methods of treatment have been discussed with the patient and family. After consideration of risks, benefits and other options for treatment, the patient has consented to  Procedure(s): CARDIOVERSION (N/A) as a surgical intervention .  The patient's history has been reviewed, patient examined, no change in status, stable for surgery.  I have reviewed the patient's chart and labs.  Questions were answered to the patient's satisfaction.     Minus Breeding

## 2017-12-16 ENCOUNTER — Other Ambulatory Visit: Payer: Medicare Other | Admitting: *Deleted

## 2017-12-16 DIAGNOSIS — Z79899 Other long term (current) drug therapy: Secondary | ICD-10-CM

## 2017-12-16 LAB — BASIC METABOLIC PANEL
BUN/Creatinine Ratio: 21 (ref 12–28)
BUN: 16 mg/dL (ref 8–27)
CO2: 27 mmol/L (ref 20–29)
Calcium: 9.1 mg/dL (ref 8.7–10.3)
Chloride: 102 mmol/L (ref 96–106)
Creatinine, Ser: 0.75 mg/dL (ref 0.57–1.00)
GFR calc Af Amer: 93 mL/min/{1.73_m2} (ref >59)
GFR calc non Af Amer: 80 mL/min/{1.73_m2} (ref >59)
Glucose: 113 mg/dL — ABNORMAL HIGH (ref 65–99)
Potassium: 3.6 mmol/L (ref 3.5–5.2)
Sodium: 143 mmol/L (ref 134–144)

## 2017-12-28 ENCOUNTER — Telehealth: Payer: Self-pay | Admitting: Cardiology

## 2017-12-28 NOTE — Telephone Encounter (Signed)
Patient calling, states that she was scheduled to have cardioversion on 12-14-17 but could not because potassium was too low. Patient would like an update.

## 2017-12-28 NOTE — Telephone Encounter (Signed)
Returned call to patient of Dr. Percival Spanish who was scheduled for DCCV on 12/14/17. This was cancelled d/t low K+ (MD note copied below). Patient is now on K+ supplement and had repeat labs on 1/10 (low end of normal range - 3.6). Her last MD OV was 11/24/17  Will defer to MD to review and advise when cardioversion should be rescheduled     Patient presented for a cardioversion but her potassium was 2.6.  She will be given IV potassium and PO and we will repeat a BMET and plan DCCV next week.            Electronically signed by Minus Breeding, MD at 12/14/2017 12:23 PM

## 2017-12-30 NOTE — Telephone Encounter (Signed)
Spoke with patient and reviewed all instructions,date and time.  She states understanding and had no further questions.

## 2017-12-30 NOTE — Telephone Encounter (Signed)
Appears pt has been scheduled for 01/04/2018 (Tuesday) at 8 am.  She should report at 6:30 am NPO, hold Furosemide that AM.  Follow same instructions as given last time.  Attempted to contact patient and left message to c/b for instructions.

## 2018-01-03 ENCOUNTER — Other Ambulatory Visit: Payer: Self-pay | Admitting: Cardiology

## 2018-01-03 NOTE — Anesthesia Preprocedure Evaluation (Addendum)
Anesthesia Evaluation  Patient identified by MRN, date of birth, ID band Patient awake    Reviewed: Allergy & Precautions, H&P , NPO status , Patient's Chart, lab work & pertinent test results  Airway Mallampati: III  TM Distance: >3 FB Neck ROM: Full    Dental no notable dental hx. (+) Teeth Intact, Dental Advisory Given   Pulmonary neg pulmonary ROS,    Pulmonary exam normal breath sounds clear to auscultation       Cardiovascular Exercise Tolerance: Good hypertension, Pt. on medications and Pt. on home beta blockers + dysrhythmias Atrial Fibrillation  Rhythm:Regular Rate:Normal     Neuro/Psych Anxiety TIAnegative psych ROS   GI/Hepatic Neg liver ROS, GERD  Medicated and Controlled,  Endo/Other  Morbid obesity  Renal/GU negative Renal ROS  negative genitourinary   Musculoskeletal   Abdominal   Peds  Hematology negative hematology ROS (+)   Anesthesia Other Findings   Reproductive/Obstetrics negative OB ROS                            Anesthesia Physical Anesthesia Plan  ASA: III  Anesthesia Plan: General   Post-op Pain Management:    Induction: Intravenous  PONV Risk Score and Plan: 3 and Treatment may vary due to age or medical condition  Airway Management Planned: Mask  Additional Equipment:   Intra-op Plan:   Post-operative Plan:   Informed Consent: I have reviewed the patients History and Physical, chart, labs and discussed the procedure including the risks, benefits and alternatives for the proposed anesthesia with the patient or authorized representative who has indicated his/her understanding and acceptance.   Dental advisory given  Plan Discussed with: CRNA  Anesthesia Plan Comments:         Anesthesia Quick Evaluation

## 2018-01-04 ENCOUNTER — Ambulatory Visit (HOSPITAL_COMMUNITY): Payer: Medicare Other | Admitting: Anesthesiology

## 2018-01-04 ENCOUNTER — Other Ambulatory Visit: Payer: Self-pay

## 2018-01-04 ENCOUNTER — Encounter (HOSPITAL_COMMUNITY): Payer: Self-pay | Admitting: *Deleted

## 2018-01-04 ENCOUNTER — Encounter (HOSPITAL_COMMUNITY)
Admission: RE | Disposition: A | Discharge status: Home / Self Care | Payer: Self-pay | Source: Ambulatory Visit | Attending: Cardiology

## 2018-01-04 ENCOUNTER — Ambulatory Visit (HOSPITAL_COMMUNITY)
Admission: RE | Admit: 2018-01-04 | Discharge: 2018-01-04 | Disposition: A | Discharge status: Home / Self Care | Payer: Medicare Other | Source: Ambulatory Visit | Attending: Cardiology | Admitting: Cardiology

## 2018-01-04 DIAGNOSIS — Z7901 Long term (current) use of anticoagulants: Secondary | ICD-10-CM | POA: Diagnosis not present

## 2018-01-04 DIAGNOSIS — I10 Essential (primary) hypertension: Secondary | ICD-10-CM | POA: Diagnosis not present

## 2018-01-04 DIAGNOSIS — Z885 Allergy status to narcotic agent status: Secondary | ICD-10-CM | POA: Diagnosis not present

## 2018-01-04 DIAGNOSIS — Z8673 Personal history of transient ischemic attack (TIA), and cerebral infarction without residual deficits: Secondary | ICD-10-CM | POA: Diagnosis not present

## 2018-01-04 DIAGNOSIS — Z880 Allergy status to penicillin: Secondary | ICD-10-CM | POA: Insufficient documentation

## 2018-01-04 DIAGNOSIS — I4891 Unspecified atrial fibrillation: Secondary | ICD-10-CM | POA: Diagnosis not present

## 2018-01-04 DIAGNOSIS — F419 Anxiety disorder, unspecified: Secondary | ICD-10-CM | POA: Diagnosis not present

## 2018-01-04 DIAGNOSIS — I482 Chronic atrial fibrillation: Secondary | ICD-10-CM | POA: Insufficient documentation

## 2018-01-04 DIAGNOSIS — Z6841 Body Mass Index (BMI) 40.0 and over, adult: Secondary | ICD-10-CM | POA: Insufficient documentation

## 2018-01-04 DIAGNOSIS — K219 Gastro-esophageal reflux disease without esophagitis: Secondary | ICD-10-CM | POA: Diagnosis not present

## 2018-01-04 DIAGNOSIS — Z0189 Encounter for other specified special examinations: Secondary | ICD-10-CM

## 2018-01-04 DIAGNOSIS — Z79899 Other long term (current) drug therapy: Secondary | ICD-10-CM | POA: Diagnosis not present

## 2018-01-04 DIAGNOSIS — Z888 Allergy status to other drugs, medicaments and biological substances status: Secondary | ICD-10-CM | POA: Insufficient documentation

## 2018-01-04 DIAGNOSIS — E785 Hyperlipidemia, unspecified: Secondary | ICD-10-CM | POA: Diagnosis not present

## 2018-01-04 HISTORY — PX: CARDIOVERSION: SHX1299

## 2018-01-04 LAB — POCT I-STAT 4, (NA,K, GLUC, HGB,HCT)
Glucose, Bld: 99 mg/dL (ref 65–99)
HCT: 38 % (ref 36.0–46.0)
Hemoglobin: 12.9 g/dL (ref 12.0–15.0)
Potassium: 3.5 mmol/L (ref 3.5–5.1)
Sodium: 141 mmol/L (ref 135–145)

## 2018-01-04 SURGERY — CARDIOVERSION
Anesthesia: General

## 2018-01-04 MED ORDER — SODIUM CHLORIDE 0.9% FLUSH: 3.0000 mL | Freq: Two times a day (BID) | INTRAVENOUS | Status: DC

## 2018-01-04 MED ORDER — PROPOFOL 10 MG/ML IV BOLUS
INTRAVENOUS | Status: DC | PRN
  Administered 2018-01-04: 50 mg via INTRAVENOUS
  Administered 2018-01-04: 20 mg via INTRAVENOUS

## 2018-01-04 MED ORDER — SODIUM CHLORIDE 0.9% FLUSH: 3.0000 mL | INTRAVENOUS | Status: DC | PRN

## 2018-01-04 MED ORDER — SODIUM CHLORIDE 0.9 % IV SOLN
250.0000 mL | INTRAVENOUS | Status: DC
  Administered 2018-01-04: 250 mL via INTRAVENOUS

## 2018-01-04 NOTE — Discharge Instructions (Signed)
Electrical Cardioversion, Care After °This sheet gives you information about how to care for yourself after your procedure. Your health care provider may also give you more specific instructions. If you have problems or questions, contact your health care provider. °What can I expect after the procedure? °After the procedure, it is common to have: °· Some redness on the skin where the shocks were given. ° °Follow these instructions at home: °· Do not drive for 24 hours if you were given a medicine to help you relax (sedative). °· Take over-the-counter and prescription medicines only as told by your health care provider. °· Ask your health care provider how to check your pulse. Check it often. °· Rest for 48 hours after the procedure or as told by your health care provider. °· Avoid or limit your caffeine use as told by your health care provider. °Contact a health care provider if: °· You feel like your heart is beating too quickly or your pulse is not regular. °· You have a serious muscle cramp that does not go away. °Get help right away if: °· You have discomfort in your chest. °· You are dizzy or you feel faint. °· You have trouble breathing or you are short of breath. °· Your speech is slurred. °· You have trouble moving an arm or leg on one side of your body. °· Your fingers or toes turn cold or blue. °This information is not intended to replace advice given to you by your health care provider. Make sure you discuss any questions you have with your health care provider. °Document Released: 09/13/2013 Document Revised: 06/26/2016 Document Reviewed: 05/29/2016 °Elsevier Interactive Patient Education © 2018 [employer] ° °

## 2018-01-04 NOTE — Interval H&P Note (Signed)
History and Physical Interval Note:  01/04/2018 8:02 AM  [name] Collier  has presented today for surgery, with the diagnosis of AFIB  The various methods of treatment have been discussed with the patient and family. After consideration of risks, benefits and other options for treatment, the patient has consented to  Procedure(s): CARDIOVERSION (N/A) as a surgical intervention .  The patient's history has been reviewed, patient examined, no change in status, stable for surgery.  I have reviewed the patient's chart and labs.  Questions were answered to the patient's satisfaction.     [name]

## 2018-01-04 NOTE — H&P (Signed)
Patient ID: [name] Collier MRN: [id number], DOB/AGE: 1946-09-15   Admit date: 01/04/2018   Primary Physician: Dione Housekeeper, MD Primary Cardiologist: Dr Percival Spanish  Pt. Profile:  A-fib, OP cardioversion  Problem List  Past Medical History:  Diagnosis Date  . GERD (gastroesophageal reflux disease)   . HTN (hypertension)   . Hyperlipidemia   . TIA (transient ischemic attack)     Past Surgical History:  Procedure Laterality Date  . KNEE SURGERY Bilateral    Torn miniscus  . RECTOCELE REPAIR    . TONSILLECTOMY    . VAGINAL HYSTERECTOMY       Allergies  Allergies  Allergen Reactions  . Codeine Palpitations and Rash  . Prednisone Other (See Comments)    Jittery, red in the face  . Augmentin [Amoxicillin-Pot Clavulanate] Other (See Comments)    c-diff Has patient had a PCN reaction causing immediate rash, facial/tongue/throat swelling, SOB or lightheadedness with hypotension: No Has patient had a PCN reaction causing severe rash involving mucus membranes or skin necrosis: No Has patient had a PCN reaction that required hospitalization: No Has patient had a PCN reaction occurring within the last 10 years: Yes If all of the above answers are "NO", then may proceed with Cephalosporin use.     HPI  Patient is a 72 y.o. female with a PMHx of chronic atrial fibrillation, first diagnosed in Dec 2018, who was admitted to [facility] on 01/04/2018 for a scheduled outpatient cardioversion. She is NPO, took her xarelto without interruption for the last month, the last dose the last night.   Home Medications  Prior to Admission medications   Medication Sig Start Date End Date Taking? Authorizing Provider  acetaminophen (TYLENOL) 500 MG tablet Take 1,000 mg by mouth every 6 (six) hours as needed for moderate pain or headache.    Yes [provider]  amLODipine (NORVASC) 2.5 MG tablet Take 1 tablet (2.5 mg total) by mouth daily. 11/11/17 02/09/18 Yes Minus Breeding, MD    Cholecalciferol (VITAMIN D3) 5000 units CAPS Take 5,000 Units by mouth at bedtime.   Yes [provider]  escitalopram (LEXAPRO) 10 MG tablet Take 10 mg by mouth at bedtime.    Yes [provider]  ezetimibe (ZETIA) 10 MG tablet Take 10 mg by mouth daily.  07/21/17  Yes [provider]  famotidine (PEPCID) 20 MG tablet Take 20 mg by mouth 2 (two) times daily.    Yes [provider]  furosemide (LASIX) 20 MG tablet Take 1 tablet (20 mg total) by mouth daily for 210 doses. 11/24/17 06/22/18 Yes Minus Breeding, MD  metoprolol succinate (TOPROL-XL) 50 MG 24 hr tablet Take 50 mg by mouth daily.  10/19/17  Yes [provider]  potassium chloride 20 MEQ TBCR Please take 1 tab twice per day on Wednesday and Thursday. Then take 1 tab once daily therafter Patient taking differently: Take 20 mEq by mouth daily.  12/14/17  Yes Kathyrn Drown D, NP  rivaroxaban (XARELTO) 20 MG TABS tablet Take 1 tablet (20 mg total) by mouth daily with supper. 11/11/17  Yes Minus Breeding, MD  vitamin B-12 (CYANOCOBALAMIN) 1000 MCG tablet Take 1,000 mcg by mouth daily.   Yes [provider]    Family History  Family History  Problem Relation Age of Onset  . Hypertension Mother   . Hyperlipidemia Mother   . Neurologic Disorder Mother 22       GB  . Stroke Son   . Stroke Maternal Uncle   .  Stroke Grandchild   . Prostate cancer Brother     Social History  Social History   Socioeconomic History  . Marital status: Widowed    Spouse name: Not on file  . Number of children: 2  . Years of education: 76  . Highest education level: Not on file  Social Needs  . Financial resource strain: Not on file  . Food insecurity - worry: Not on file  . Food insecurity - inability: Not on file  . Transportation needs - medical: Not on file  . Transportation needs - non-medical: Not on file  Occupational History  . Not on file  Tobacco Use  . Smoking status: Never Smoker   . Smokeless tobacco: Never Used  Substance and Sexual Activity  . Alcohol use: No    Alcohol/week: 0.0 oz  . Drug use: No  . Sexual activity: Not on file  Other Topics Concern  . Not on file  Social History Narrative   Lives alone   Caffeine use: Soda/tea daily     Review of Systems General:  No chills, fever, night sweats or weight changes.  Cardiovascular:  No chest pain, dyspnea on exertion, edema, orthopnea, palpitations, paroxysmal nocturnal dyspnea. Dermatological: No rash, lesions/masses Respiratory: No cough, dyspnea Urologic: No hematuria, dysuria Abdominal:   No nausea, vomiting, diarrhea, bright red blood per rectum, melena, or hematemesis Neurologic:  No visual changes, wkns, changes in mental status. All other systems reviewed and are otherwise negative except as noted above.  Physical Exam  Blood pressure (!) 121/39, temperature 98.1 F (36.7 C), temperature source Oral, resp. rate (!) 21, height 5\' 6"  (1.676 m), weight 264 lb (119.7 kg), SpO2 94 %.  General: Pleasant, NAD, obese Psych: Normal affect. Neuro: Alert and oriented X 3. Moves all extremities spontaneously. HEENT: Normal  Neck: Supple without bruits or JVD. Lungs:  Resp regular and unlabored, CTA. Heart: RRR no s3, s4, or murmurs. Abdomen: Soft, non-tender, non-distended, BS + x 4.  Extremities: No clubbing, cyanosis or edema. DP/PT/Radials 2+ and equal bilaterally.  Labs  No results for input(s): CKTOTAL, CKMB, TROPONINI in the last 72 hours. Lab Results  Component Value Date   WBC 6.9 12/14/2017   HGB 13.5 12/14/2017   HCT 42.3 12/14/2017   MCV 89.2 12/14/2017   PLT 233 12/14/2017   No results for input(s): NA, K, CL, CO2, BUN, CREATININE, CALCIUM, PROT, BILITOT, ALKPHOS, ALT, AST, GLUCOSE in the last 168 hours.  Invalid input(s): LABALBU Lab Results  Component Value Date   CHOL 202 (H) 02/27/2016   HDL 44 02/27/2016   LDLCALC 139 (H) 02/27/2016   TRIG 97 02/27/2016   No results  found for: DDIMER Invalid input(s): POCBNP   Radiology/Studies  ASSESSMENT AND PLAN  72 year old female with chronic atrial fibrillation who presented for a cardioversion.   DVT PPX - Xarelto, last dose taken the last night   Signed, [name], MD, [facility] 01/04/2018, 8:03 AM

## 2018-01-04 NOTE — CV Procedure (Signed)
   Cardioversion Note  MAKIYLA LINCH [id number] 1946/02/10  Procedure: DC Cardioversion Indications: atrial fibrillation  Procedure Details Consent: Obtained Time Out: Verified patient identification, verified procedure, site/side was marked, verified correct patient position, special equipment/implants available, Radiology Safety Procedures followed,  medications/allergies/relevent history reviewed, required imaging and test results available.  Performed  The patient has been on adequate anticoagulation.  The patient received IV propofol administered by anesthesia staff for sedation.  Synchronous cardioversion was performed at 120, 150, 200 and 200 joules.  The cardioversion was successful, the patient went into Wenckebach periods immediately post cardioversion, then into SR with 1.AVB.  Complications: No apparent complications Patient did tolerate procedure well.  [name], MD, [facility] 01/04/2018, 8:46 AM

## 2018-01-04 NOTE — Transfer of Care (Signed)
Immediate Anesthesia Transfer of Care Note  Patient: [name] Collier  Procedure(s) Performed: CARDIOVERSION (N/A )  Patient Location: PACU and Endoscopy Unit  Anesthesia Type:General  Level of Consciousness: patient cooperative and responds to stimulation  Airway & Oxygen Therapy: Patient Spontanous Breathing  Post-op Assessment: Report given to RN and Post -op Vital signs reviewed and stable  Post vital signs: Reviewed and stable  Last Vitals:  Vitals:   01/04/18 0655  BP: (!) 121/39  Resp: (!) 21  Temp: 36.7 C  SpO2: 94%    Last Pain:  Vitals:   01/04/18 0655  TempSrc: Oral         Complications: No apparent anesthesia complications

## 2018-01-04 NOTE — Anesthesia Postprocedure Evaluation (Signed)
Anesthesia Post Note  Patient: [name] Collier  Procedure(s) Performed: CARDIOVERSION (N/A )     Patient location during evaluation: PACU Anesthesia Type: General Level of consciousness: awake and alert Pain management: pain level controlled Vital Signs Assessment: post-procedure vital signs reviewed and stable Respiratory status: spontaneous breathing, nonlabored ventilation and respiratory function stable Cardiovascular status: blood pressure returned to baseline and stable Postop Assessment: no apparent nausea or vomiting Anesthetic complications: no    Last Vitals:  Vitals:   01/04/18 0655 01/04/18 0822  BP: (!) 121/39 (!) 131/51  Pulse:  64  Resp: (!) 21 (!) 26  Temp: 36.7 C 36.4 C  SpO2: 94% 94%    Last Pain:  Vitals:   01/04/18 0822  TempSrc: Oral                 Andreal Vultaggio,W. EDMOND

## 2018-01-05 ENCOUNTER — Encounter (HOSPITAL_COMMUNITY): Payer: Self-pay | Admitting: Cardiology

## 2018-01-11 NOTE — Progress Notes (Signed)
Cardiology Office Note   Date:  01/12/2018   ID:  [name] Collier, DOB [birth year], MRN [id number]  PCP:  Dione Housekeeper, MD  Cardiologist:   Minus Breeding, MD  Referring:  Dione Housekeeper, MD  Chief Complaint  Patient presents with  . Atrial Fibrillation    History of Present Illness: [name] Collier is a 72 y.o. female who was referred by Dr. Edrick Oh for evaluation of dyspnea.  She has been seen recently for atrial fib.   TSH that was normal.  However, she had a BNP that was mildly elevated.   Echo demonstrated a normal EF and elevated pulmonary pressure at 69 mm Hg.  She came for a DCCV but her potassium was too low.  We supplemented this and brought her back for elective DCCV.  It took several shocks but she went back into NSR.    Since she has been on Lasix and metoprolol and Norvasc for her blood pressure even before her cardioversion she felt better when she had her acute shortness of breath.  She is not noticing any palpitations now other than an occasional flutter but she thinks overall she feels better.  She is dieting and losing exercise.  She has no acute shortness of breath, PND or orthopnea.  She has no palpitations, presyncope or syncope.  She has no chest pressure, neck or arm discomfort.   Past Medical History:  Diagnosis Date  . GERD (gastroesophageal reflux disease)   . HTN (hypertension)   . Hyperlipidemia   . TIA (transient ischemic attack)     Past Surgical History:  Procedure Laterality Date  . CARDIOVERSION N/A 01/04/2018   Procedure: CARDIOVERSION;  Surgeon: Dorothy Spark, MD;  Location: [facility] ENDOSCOPY;  Service: Cardiovascular;  Laterality: N/A;  . KNEE SURGERY Bilateral    Torn miniscus  . RECTOCELE REPAIR    . TONSILLECTOMY    . VAGINAL HYSTERECTOMY       Current Outpatient Medications  Medication Sig Dispense Refill  . acetaminophen (TYLENOL) 500 MG tablet Take 1,000 mg by mouth every 6 (six) hours as needed for moderate pain or headache.       Marland Kitchen amLODipine (NORVASC) 2.5 MG tablet Take 1 tablet (2.5 mg total) by mouth daily. 90 tablet 3  . Cholecalciferol (VITAMIN D3) 5000 units CAPS Take 5,000 Units by mouth at bedtime.    Marland Kitchen escitalopram (LEXAPRO) 10 MG tablet Take 10 mg by mouth at bedtime.     Marland Kitchen ezetimibe (ZETIA) 10 MG tablet Take 10 mg by mouth daily.     . famotidine (PEPCID) 20 MG tablet Take 20 mg by mouth 2 (two) times daily.     . furosemide (LASIX) 20 MG tablet Take 1 tablet (20 mg total) by mouth daily for 210 doses. 30 tablet 6  . metoprolol succinate (TOPROL-XL) 50 MG 24 hr tablet Take 50 mg by mouth daily.     . Potassium Chloride ER 20 MEQ TBCR Take 20 mEq by mouth daily. 90 tablet 3  . rivaroxaban (XARELTO) 20 MG TABS tablet Take 1 tablet (20 mg total) by mouth daily with supper. 30 tablet 11  . vitamin B-12 (CYANOCOBALAMIN) 1000 MCG tablet Take 1,000 mcg by mouth daily.     No current facility-administered medications for this visit.     Allergies:   Codeine; Prednisone; and Augmentin [amoxicillin-pot clavulanate]    ROS:  Please see the history of present illness.   Otherwise, review of systems are positive for none.  All other systems are reviewed and negative.    PHYSICAL EXAM: VS:  BP 136/70   Pulse 62   Ht 5\' 6"  (1.676 m)   Wt 270 lb (122.5 kg)   BMI 43.58 kg/m  , BMI Body mass index is 43.58 kg/m.  GENERAL:  Well appearing NECK:  No jugular venous distention, waveform within normal limits, carotid upstroke brisk and symmetric, no bruits, no thyromegaly LUNGS:  Clear to auscultation bilaterally CHEST:  Unremarkable HEART:  PMI not displaced or sustained,S1 and S2 within normal limits, no S3, no S4, no clicks, no rubs, no murmurs ABD:  Flat, positive bowel sounds normal in frequency in pitch, no bruits, no rebound, no guarding, no midline pulsatile mass, no hepatomegaly, no splenomegaly EXT:  2 plus pulses throughout, no edema, no cyanosis no clubbing   EKG:  EKG is  ordered today. Sinus rhythm,  rate 65, axis leftward, marked first-degree heart block, no acute ST-T wave changes.  Recent Labs: 11/11/2017: BNP 469.6; TSH 3.820 12/14/2017: Platelets 233 12/16/2017: BUN 16; Creatinine, Ser 0.75 01/04/2018: Hemoglobin 12.9; Potassium 3.5; Sodium 141    Lipid Panel    Component Value Date/Time   CHOL 202 (H) 02/27/2016 0519   TRIG 97 02/27/2016 0519   HDL 44 02/27/2016 0519   CHOLHDL 4.6 02/27/2016 0519   VLDL 19 02/27/2016 0519   LDLCALC 139 (H) 02/27/2016 0519      Wt Readings from Last 3 Encounters:  01/12/18 270 lb (122.5 kg)  01/04/18 264 lb (119.7 kg)  12/14/17 276 lb (125.2 kg)      Other studies Reviewed: Additional studies/ records that were reviewed today include: None Review of the above records demonstrates:      ASSESSMENT AND PLAN:   HTN:   The blood pressure is at target. No change in medications is indicated. We will continue with therapeutic lifestyle changes (TLC).  DYSPNEA:    This is improved.  She will continue the meds as listed.  I will check a potassium level today.  ATRIAL FIB:   Ms. BAILYN SPACKMAN has a CHA2DS2 - VASc score of 4.   She will continue on anticoagulation.  At this point no change in therapy is indicated.  PULMONARY HTN: I will follow this up with repeat echocardiography at a future appt.    SLEEP APNEA:   She will be scheduled for a sleep study.    Current medicines are reviewed at length with the patient today.  The patient does not have concerns regarding medicines.  The following changes have been made:  None  Labs/ tests ordered today include:   Orders Placed This Encounter  Procedures  . Potassium  . EKG 12-Lead  . Split night study     Disposition:   FU me in 3 months.    Signed, Minus Breeding, MD  01/12/2018 4:21 PM    [facility]

## 2018-01-12 ENCOUNTER — Other Ambulatory Visit: Payer: Medicare Other

## 2018-01-12 ENCOUNTER — Ambulatory Visit: Payer: Medicare Other | Admitting: Cardiology

## 2018-01-12 ENCOUNTER — Encounter: Payer: Self-pay | Admitting: Cardiology

## 2018-01-12 VITALS — BP 136/70 | HR 62 | Ht 66.0 in | Wt 270.0 lb

## 2018-01-12 DIAGNOSIS — E876 Hypokalemia: Secondary | ICD-10-CM | POA: Diagnosis not present

## 2018-01-12 DIAGNOSIS — R0683 Snoring: Secondary | ICD-10-CM | POA: Diagnosis not present

## 2018-01-12 DIAGNOSIS — I10 Essential (primary) hypertension: Secondary | ICD-10-CM

## 2018-01-12 DIAGNOSIS — R5383 Other fatigue: Secondary | ICD-10-CM | POA: Diagnosis not present

## 2018-01-12 DIAGNOSIS — I481 Persistent atrial fibrillation: Secondary | ICD-10-CM | POA: Diagnosis not present

## 2018-01-12 DIAGNOSIS — I4819 Other persistent atrial fibrillation: Secondary | ICD-10-CM

## 2018-01-12 MED ORDER — POTASSIUM CHLORIDE ER 20 MEQ PO TBCR: 20.0000 meq | EXTENDED_RELEASE_TABLET | Freq: Every day | ORAL | 3 refills | Status: DC

## 2018-01-12 NOTE — Patient Instructions (Signed)
Medication Instructions:  The current medical regimen is effective;  continue present plan and medications.  Labwork: Please have blood work at [facility] today (potassium level).  Testing/Procedures: Your physician has recommended that you have a sleep study. This test records several body functions during sleep, including: brain activity, eye movement, oxygen and carbon dioxide blood levels, heart rate and rhythm, breathing rate and rhythm, the flow of air through your mouth and nose, snoring, body muscle movements, and chest and belly movement. This will be completed at [facility] [phone].  Follow-Up: Follow up in 3 months with Dr Percival Spanish.  If you need a refill on your cardiac medications before your next appointment, please call your pharmacy.  Thank you for choosing [facility]!!

## 2018-01-13 LAB — POTASSIUM: POTASSIUM: 4.3 mmol/L (ref 3.5–5.2)

## 2018-02-22 ENCOUNTER — Other Ambulatory Visit (HOSPITAL_COMMUNITY): Payer: Self-pay | Admitting: Adult Health Nurse Practitioner

## 2018-02-22 DIAGNOSIS — Z780 Asymptomatic menopausal state: Secondary | ICD-10-CM | POA: Insufficient documentation

## 2018-03-03 ENCOUNTER — Ambulatory Visit (HOSPITAL_COMMUNITY)
Admission: RE | Admit: 2018-03-03 | Discharge: 2018-03-03 | Disposition: A | Discharge status: Home / Self Care | Payer: Medicare Other | Source: Ambulatory Visit | Attending: Adult Health Nurse Practitioner | Admitting: Adult Health Nurse Practitioner

## 2018-03-03 DIAGNOSIS — Z780 Asymptomatic menopausal state: Secondary | ICD-10-CM | POA: Diagnosis not present

## 2018-04-12 NOTE — Progress Notes (Signed)
Cardiology Office Note   Date:  04/13/2018   ID:  [name] Collier, DOB December 16, 1945, MRN [id number]  PCP:  Dione Housekeeper, MD  Cardiologist:   Minus Breeding, MD  Referring:  Dione Housekeeper, MD  Chief Complaint  Patient presents with  . Hypertension  . Shortness of Breath  . Atrial Fibrillation    History of Present Illness: [name] Collier is a 72 y.o. female who was referred by Dr. Edrick Oh for evaluation of dyspnea.  She has atrial fib.   TSH that was normal.  However, she had a BNP that was mildly elevated.  She came for a DCCV but her potassium was too low.  We supplemented this and brought her back for elective DCCV.  It took several shocks but she went back into NSR.    Since that time she thinks she has maintained sinus rhythm. The patient denies any new symptoms such as chest discomfort, neck or arm discomfort. There has been no new shortness of breath, PND or orthopnea. There have been no reported palpitations, presyncope or syncope.  She is limited by knee pain and has had injections and will be getting more of these.   Past Medical History:  Diagnosis Date  . GERD (gastroesophageal reflux disease)   . HTN (hypertension)   . Hyperlipidemia   . TIA (transient ischemic attack)     Past Surgical History:  Procedure Laterality Date  . CARDIOVERSION N/A 01/04/2018   Procedure: CARDIOVERSION;  Surgeon: Dorothy Spark, MD;  Location: [facility] ENDOSCOPY;  Service: Cardiovascular;  Laterality: N/A;  . KNEE SURGERY Bilateral    Torn miniscus  . RECTOCELE REPAIR    . TONSILLECTOMY    . VAGINAL HYSTERECTOMY       Current Outpatient Medications  Medication Sig Dispense Refill  . amLODipine (NORVASC) 2.5 MG tablet Take 1 tablet (2.5 mg total) by mouth daily. 90 tablet 3  . Cholecalciferol (VITAMIN D3) 5000 units CAPS Take 5,000 Units by mouth at bedtime.    Marland Kitchen escitalopram (LEXAPRO) 10 MG tablet Take 10 mg by mouth at bedtime.     Marland Kitchen ezetimibe (ZETIA) 10 MG tablet Take 10 mg by  mouth daily.     . famotidine (PEPCID) 20 MG tablet Take 20 mg by mouth 2 (two) times daily.     . furosemide (LASIX) 20 MG tablet Take 1 tablet (20 mg total) by mouth daily for 210 doses. 30 tablet 6  . metoprolol succinate (TOPROL-XL) 50 MG 24 hr tablet Take 50 mg by mouth daily.     . Potassium Chloride ER 20 MEQ TBCR Take 20 mEq by mouth daily. 90 tablet 3  . rivaroxaban (XARELTO) 20 MG TABS tablet Take 1 tablet (20 mg total) by mouth daily with supper. 30 tablet 11  . vitamin B-12 (CYANOCOBALAMIN) 1000 MCG tablet Take 1,000 mcg by mouth daily.    Marland Kitchen acetaminophen (TYLENOL) 500 MG tablet Take 1,000 mg by mouth every 6 (six) hours as needed for moderate pain or headache.      No current facility-administered medications for this visit.     Allergies:   Augmentin [amoxicillin-pot clavulanate]; Codeine; and Prednisone    ROS:  Please see the history of present illness.   Otherwise, review of systems are positive for none.   All other systems are reviewed and negative.    PHYSICAL EXAM: VS:  BP (!) 168/79   Pulse (!) 56   Ht 5\' 5"  (1.651 m)   Wt 270 lb (  122.5 kg)   BMI 44.93 kg/m  , BMI Body mass index is 44.93 kg/m.  GENERAL:  Well appearing NECK:  No jugular venous distention, waveform within normal limits, carotid upstroke brisk and symmetric, no bruits, no thyromegaly LUNGS:  Clear to auscultation bilaterally CHEST:  Unremarkable HEART:  PMI not displaced or sustained,S1 and S2 within normal limits, no S3, no S4, no clicks, no rubs, no murmurs ABD:  Flat, positive bowel sounds normal in frequency in pitch, no bruits, no rebound, no guarding, no midline pulsatile mass, no hepatomegaly, no splenomegaly EXT:  2 plus pulses throughout, no edema, no cyanosis no clubbing   EKG:  EKG is  ordered today.   Recent Labs: 11/11/2017: BNP 469.6; TSH 3.820 12/14/2017: Platelets 233 12/16/2017: BUN 16; Creatinine, Ser 0.75 01/04/2018: Hemoglobin 12.9; Sodium 141 01/12/2018: Potassium 4.3     Lipid Panel    Component Value Date/Time   CHOL 202 (H) 02/27/2016 0519   TRIG 97 02/27/2016 0519   HDL 44 02/27/2016 0519   CHOLHDL 4.6 02/27/2016 0519   VLDL 19 02/27/2016 0519   LDLCALC 139 (H) 02/27/2016 0519      Wt Readings from Last 3 Encounters:  04/13/18 270 lb (122.5 kg)  01/12/18 270 lb (122.5 kg)  01/04/18 264 lb (119.7 kg)      Other studies Reviewed: Additional studies/ records that were reviewed today include: None Review of the above records demonstrates:      ASSESSMENT AND PLAN:   HTN:   Her blood pressure is elevated today but this is not usually the case and she is going to keep a blood pressure diary.  Further med titration will be based on this result.   DYSPNEA:     This is improved.  No change in therapy.   ATRIAL FIB:   [name] Collier has a CHA2DS2 - VASc score of 4. No change in therapy.  I suspect she is maintaining sinus rhythm.  PULMONARY HTN:  I will repeat an echo in Dec.   SLEEP APNEA:     She will be scheduled for a sleep study.   MORBID OBESITY:  She has lost about 20 lbs from peak and we talked about this.  She understands the importance of further weight loss.   Current medicines are reviewed at length with the patient today.  The patient does not have concerns regarding medicines.  The following changes have been made:  None    Labs/ tests ordered today include:   Orders Placed This Encounter  Procedures  . Split night study     Disposition:   FU me in 12 months.    Signed, Minus Breeding, MD  04/13/2018 1:48 PM    [facility]

## 2018-04-13 ENCOUNTER — Encounter: Payer: Self-pay | Admitting: Cardiology

## 2018-04-13 ENCOUNTER — Telehealth: Payer: Self-pay | Admitting: *Deleted

## 2018-04-13 ENCOUNTER — Ambulatory Visit: Payer: Medicare Other | Admitting: Cardiology

## 2018-04-13 VITALS — BP 168/79 | HR 56 | Ht 65.0 in | Wt 270.0 lb

## 2018-04-13 DIAGNOSIS — R0683 Snoring: Secondary | ICD-10-CM

## 2018-04-13 DIAGNOSIS — I10 Essential (primary) hypertension: Secondary | ICD-10-CM

## 2018-04-13 DIAGNOSIS — I480 Paroxysmal atrial fibrillation: Secondary | ICD-10-CM | POA: Diagnosis not present

## 2018-04-13 DIAGNOSIS — R0602 Shortness of breath: Secondary | ICD-10-CM

## 2018-04-13 NOTE — Patient Instructions (Addendum)
Medication Instructions:  The current medical regimen is effective;  continue present plan and medications.  Testing/Procedures: Your physician has recommended that you have a sleep study. This test records several body functions during sleep, including: brain activity, eye movement, oxygen and carbon dioxide blood levels, heart rate and rhythm, breathing rate and rhythm, the flow of air through your mouth and nose, snoring, body muscle movements, and chest and belly movement.  Follow-Up: Follow up in 1 year with Dr. Percival Spanish.  You will receive a letter in the mail 2 months before you are due.  Please call us when you receive this letter to schedule your follow up appointment.  If you need a refill on your cardiac medications before your next appointment, please call your pharmacy.  Thank you for choosing [facility]!!

## 2018-04-13 NOTE — Telephone Encounter (Signed)
Patient notified of sleep study appointment scheduled on 05/06/18.

## 2018-04-20 DIAGNOSIS — M179 Osteoarthritis of knee, unspecified: Secondary | ICD-10-CM | POA: Insufficient documentation

## 2018-04-20 DIAGNOSIS — M1710 Unilateral primary osteoarthritis, unspecified knee: Secondary | ICD-10-CM | POA: Insufficient documentation

## 2018-05-06 ENCOUNTER — Ambulatory Visit (HOSPITAL_BASED_OUTPATIENT_CLINIC_OR_DEPARTMENT_OTHER): Payer: Medicare Other | Attending: Cardiology | Admitting: Cardiovascular Disease

## 2018-05-06 DIAGNOSIS — I480 Paroxysmal atrial fibrillation: Secondary | ICD-10-CM | POA: Insufficient documentation

## 2018-05-06 DIAGNOSIS — R0683 Snoring: Secondary | ICD-10-CM | POA: Insufficient documentation

## 2018-05-06 DIAGNOSIS — R0602 Shortness of breath: Secondary | ICD-10-CM | POA: Diagnosis present

## 2018-05-06 DIAGNOSIS — G4736 Sleep related hypoventilation in conditions classified elsewhere: Secondary | ICD-10-CM | POA: Insufficient documentation

## 2018-05-13 NOTE — Procedures (Signed)
Patient Name: [name] Collier, [name] Collier Date: 05/06/2018 Gender: Female D.O.B: May 04, 1946 Age (years): 72 Referring Provider: Minus Breeding Height (inches): 65 Interpreting Physician: Shelva Majestic MD, ABSM Weight (lbs): 265 RPSGT: Earney Hamburg BMI: 44 MRN: [id number] Neck Size: 17.50  CLINICAL INFORMATION Sleep Study Type: NPSG  Indication for sleep study: Snoring  Epworth Sleepiness Score: 2  SLEEP STUDY TECHNIQUE As per the AASM Manual for the Scoring of Sleep and Associated Events v2.3 (April 2016) with a hypopnea requiring 4% desaturations.  The channels recorded and monitored were frontal, central and occipital EEG, electrooculogram (EOG), submentalis EMG (chin), nasal and oral airflow, thoracic and abdominal wall motion, anterior tibialis EMG, snore microphone, electrocardiogram, and pulse oximetry.  MEDICATIONS     acetaminophen (TYLENOL) 500 MG tablet             amLODipine (NORVASC) 2.5 MG tablet (Expired)         Cholecalciferol (VITAMIN D3) 5000 units CAPS         escitalopram (LEXAPRO) 10 MG tablet         ezetimibe (ZETIA) 10 MG tablet         famotidine (PEPCID) 20 MG tablet         furosemide (LASIX) 20 MG tablet         metoprolol succinate (TOPROL-XL) 50 MG 24 hr tablet         Potassium Chloride ER 20 MEQ TBCR         rivaroxaban (XARELTO) 20 MG TABS tablet         vitamin B-12 (CYANOCOBALAMIN) 1000 MCG tablet      Medications self-administered by patient taken the night of the study : PEPCID AC, vitamin d-3, lexapro  SLEEP ARCHITECTURE The study was initiated at 10:03:53 PM and ended at 4:45:56 AM.  Sleep onset time was 89.4 minutes and the sleep efficiency was 67.3%%. The total sleep time was 270.7 minutes.  Stage REM latency was 129.5 minutes.  The patient spent 3.0%% of the night in stage N1 sleep, 91.7%% in stage N2 sleep, 0.0%% in stage N3 and 5.36% in REM.  Alpha intrusion was absent.  Supine sleep was 25.50%.  RESPIRATORY  PARAMETERS The overall apnea/hypopnea index (AHI) was 0.4 per hour. The respiratory disturbance index (RDI) was 1.3 per hour. There were 0 total apneas, including 0 obstructive, 0 central and 0 mixed apneas. There were 2 hypopneas and 4 RERAs.  The AHI during Stage REM sleep was 0.0 per hour.  AHI while supine was 1.7 per hour.  The mean oxygen saturation was 92.0%. The minimum SpO2 during sleep was 84.0%.  Moderate snoring was noted during this study.  CARDIAC DATA The 2 lead EKG demonstrated sinus rhythm. The mean heart rate was 51.8 beats per minute. Other EKG findings include: None.  LEG MOVEMENT DATA The total PLMS were 0 with a resulting PLMS index of 0.0. Associated arousal with leg movement index was 4.0 .  IMPRESSIONS - No significant obstructive sleep apnea occurred during this study (AHI 0.4/h; RDI 1.3/h). - No significant central sleep apnea occurred during this study (CAI = 0.0/h). - Mild oxygen desaturation to a nadir of 84%. - The patient snored with moderate snoring volume. - Reduced sleep efficiency at 67.3%. - No cardiac abnormalities were noted during this study. - Clinically significant periodic limb movements did not occur during sleep. No significant associated arousals.  DIAGNOSIS - Nocturnal Hypoxemia (327.26 [G47.36 ICD-10])  RECOMMENDATIONS - Efforts should be made to optimize nasal and  oropharyngeal patency. - At present, there is no indication for CPAP therapy; consider alternatives for the traeatment of snoring. - Avoid alcohol, sedatives and other CNS depressants that may worsen sleep apnea and disrupt normal sleep architecture. - Sleep hygiene should be reviewed to assess factors that may improve sleep quality. - Weight management (BMI 44) and regular exercise should be initiated or continued if appropriate.  [Electronically signed] 05/13/2018 05:26 PM  Shelva Majestic MD, [facility], [place], American Board of Sleep Medicine   NPI:  [id number] [place] PH: [phone]   FX: [phone] [place]

## 2018-05-15 NOTE — Progress Notes (Signed)
No significant sleep apnea.  Call Ms. Cabezas with the results and send results to Dione Housekeeper, MD

## 2018-05-16 ENCOUNTER — Telehealth: Payer: Self-pay | Admitting: *Deleted

## 2018-05-16 NOTE — Progress Notes (Signed)
Patient notified

## 2018-05-16 NOTE — Telephone Encounter (Signed)
Left message on cell # normal sleep study. Call back if questions or concerns.

## 2018-05-16 NOTE — Telephone Encounter (Signed)
-----   Message from Troy Sine, MD sent at 05/13/2018  5:30 PM EDT ----- Mariann Laster please notify pt of the results; no  OSA or need for CPAP

## 2018-09-12 NOTE — H&P (View-Only) (Signed)
Cardiology Office Note   Date:  09/13/2018   ID:  [name] Collier, DOB [birth year], MRN [id number]  PCP:  Dione Housekeeper, MD  Cardiologist: [facility]  Chief Complaint  Patient presents with  . Hypertension  . Atrial Fibrillation  . Shortness of Breath     History of Present Illness: [name] Collier is a 72 y.o. female who presents for ongoing assessment of dyspnea, atrial fib, s/p DCCV, HTN, hyperlipidemia, and TIA. Was last seen in the office on 04/13/2018, she was hypertensive, but watch and wait was planned. She was to keep track of her BP at home and record. She was due for repeat echocardiogram for December and a sleep study for 05/06/2018.   Sleep study was negative for OSA  IMPRESSIONS - No significant obstructive sleep apnea occurred during this study (AHI 0.4/h; RDI 1.3/h). - No significant central sleep apnea occurred during this study (CAI = 0.0/h). - Mild oxygen desaturation to a nadir of 84%. - The patient snored with moderate snoring volume. - Reduced sleep efficiency at 67.3%. - No cardiac abnormalities were noted during this study. - Clinically significant periodic limb movements did not occur during sleep. No significant associated arousals.  She comes today with complaints of worsening DOE, feeling tired, and retaining fluid. She states "I just don't feel right."  She states walking from the car to the waiting room today she was short of breath and felt her heart racing.    Past Medical History:  Diagnosis Date  . GERD (gastroesophageal reflux disease)   . HTN (hypertension)   . Hyperlipidemia   . TIA (transient ischemic attack)     Past Surgical History:  Procedure Laterality Date  . CARDIOVERSION N/A 01/04/2018   Procedure: CARDIOVERSION;  Surgeon: Dorothy Spark, MD;  Location: [facility] ENDOSCOPY;  Service: Cardiovascular;  Laterality: N/A;  . KNEE SURGERY Bilateral    Torn miniscus  . RECTOCELE REPAIR    . TONSILLECTOMY    . VAGINAL HYSTERECTOMY        Current Outpatient Medications  Medication Sig Dispense Refill  . acetaminophen (TYLENOL) 500 MG tablet Take 1,000 mg by mouth every 6 (six) hours as needed for moderate pain or headache.     Marland Kitchen amLODipine (NORVASC) 2.5 MG tablet Take 1 tablet (2.5 mg total) by mouth daily. 90 tablet 3  . Cholecalciferol (VITAMIN D3) 5000 units CAPS Take 5,000 Units by mouth at bedtime.    Marland Kitchen escitalopram (LEXAPRO) 10 MG tablet Take 10 mg by mouth at bedtime.     Marland Kitchen ezetimibe (ZETIA) 10 MG tablet Take 10 mg by mouth daily.     . famotidine (PEPCID) 20 MG tablet Take 20 mg by mouth 2 (two) times daily.     . furosemide (LASIX) 20 MG tablet Take 1 tablet (20 mg total) by mouth daily for 210 doses. 30 tablet 6  . metoprolol succinate (TOPROL-XL) 50 MG 24 hr tablet Take 50 mg by mouth daily.     . Potassium Chloride ER 20 MEQ TBCR Take 20 mEq by mouth daily. 90 tablet 3  . rivaroxaban (XARELTO) 20 MG TABS tablet Take 1 tablet (20 mg total) by mouth daily with supper. 30 tablet 11  . vitamin B-12 (CYANOCOBALAMIN) 1000 MCG tablet Take 1,000 mcg by mouth daily.     No current facility-administered medications for this visit.     Allergies:   Augmentin [amoxicillin-pot clavulanate]; Codeine; and Prednisone    Social History:  The patient  reports that she has  never smoked. She has never used smokeless tobacco. She reports that she does not drink alcohol or use drugs.   Family History:  The patient's family history includes Hyperlipidemia in her mother; Hypertension in her mother; Neurologic Disorder (age of onset: 33) in her mother; Prostate cancer in her brother; Stroke in her grandchild, maternal uncle, and son.    ROS: All other systems are reviewed and negative. Unless otherwise mentioned in H&P    PHYSICAL EXAM: VS:  BP (!) 154/96   Pulse (!) 53   Ht 5\' 5"  (1.651 m)   Wt 278 lb 3.2 oz (126.2 kg)   SpO2 94%   BMI 46.29 kg/m  , BMI Body mass index is 46.29 kg/m. GEN: Well nourished, well  developed, in no acute distress HEENT: normal Neck: no JVD, carotid bruits, or masses Cardiac: IRRR; no murmurs, rubs, or gallops,no edema  Respiratory:  Clear to auscultation bilaterally, normal work of breathing GI: soft, nontender, nondistended, + BS MS: no deformity or atrophy Skin: warm and dry, no rash Neuro:  Strength and sensation are intact Psych: euthymic mood, full affect   EKG:  Atrial fib, rate of 52 bpm with T-wave flattening inferiorly.   Recent Labs: 11/11/2017: BNP 469.6; TSH 3.820 12/14/2017: Platelets 233 12/16/2017: BUN 16; Creatinine, Ser 0.75 01/04/2018: Hemoglobin 12.9; Sodium 141 01/12/2018: Potassium 4.3    Lipid Panel    Component Value Date/Time   CHOL 202 (H) 02/27/2016 0519   TRIG 97 02/27/2016 0519   HDL 44 02/27/2016 0519   CHOLHDL 4.6 02/27/2016 0519   VLDL 19 02/27/2016 0519   LDLCALC 139 (H) 02/27/2016 0519      Wt Readings from Last 3 Encounters:  09/13/18 278 lb 3.2 oz (126.2 kg)  05/06/18 265 lb (120.2 kg)  04/13/18 270 lb (122.5 kg)      Other studies Reviewed: Echocardiogram 12/12/2017  Left ventricle: The cavity size was normal. There was mild   concentric hypertrophy. Systolic function was normal. The   estimated ejection fraction was in the range of 60% to 65%. Wall   motion was normal; there were no regional wall motion   abnormalities. - Aortic valve: Transvalvular velocity was within the normal range.   There was no stenosis. There was no regurgitation. - Aorta: Ascending aortic diameter: 38 mm (S). - Ascending aorta: The ascending aorta was mildly dilated. - Mitral valve: Mildly calcified annulus. Transvalvular velocity   was within the normal range. There was no evidence for stenosis.   There was mild regurgitation. - Left atrium: The atrium was mildly dilated. - Right ventricle: The cavity size was normal. Wall thickness was   normal. Systolic function was normal. - Atrial septum: No defect or patent foramen ovale was  identified. - Tricuspid valve: There was mild-moderate regurgitation. - Pulmonic valve: There was moderate regurgitation. - Pulmonary arteries: Systolic pressure was severely increased. PA   peak pressure: 69 mm Hg (S).  ASSESSMENT AND PLAN:  1. Atrial fib: She has recurrence of this which is causing her to have symptoms of DOE, and fatigue She has been compliant with all of her medications and anticoagulation. She would like to try DCCV again, as this was so helpful the last time concerning her symptoms. She is aware that this may or may not keep her in NSR as she has reverted to atrial fib. I will plan for DCCV this month. She is to hold the metoprolol XL the night before secondary to bradycardia found on EKG in the  low 50's.  I have explained the procedure. She is familiar with this has she has had it in the past. She is wiling to proceed.   2. Hypertension: BP is elevated today. She has taken it at home and it has ranged in the 015-615 range systolic. She remains on amlodipine and metoprolol. She has some dependent edema from CCB. I have advised her to take her lasix 20 mg today and one extra today only. She will take extra potassium with the extra dose. I have ordered BMET and CBC in anticipation of DCCV. She is recommended for support hose as well.   3. Hx of TIA: No residual deficits.   4.Morbid Obesity: She is negative for OSA per sleep study in May of 2019.  Current medicines are reviewed at length with the patient today.    Labs/ tests ordered today include: DCCV,  BMET and CBC.   Phill Myron. West Pugh, ANP, AACC   09/13/2018 8:53 AM    [place] [address] Office [phone] Fax [phone]

## 2018-09-12 NOTE — Progress Notes (Signed)
Cardiology Office Note   Date:  09/13/2018   ID:  [name] Collier, DOB January 06, 1946, MRN [id number]  PCP:  Dione Housekeeper, MD  Cardiologist: [facility]  Chief Complaint  Patient presents with  . Hypertension  . Atrial Fibrillation  . Shortness of Breath     History of Present Illness: [name] Collier is a 72 y.o. female who presents for ongoing assessment of dyspnea, atrial fib, s/p DCCV, HTN, hyperlipidemia, and TIA. Was last seen in the office on 04/13/2018, she was hypertensive, but watch and wait was planned. She was to keep track of her BP at home and record. She was due for repeat echocardiogram for December and a sleep study for 05/06/2018.   Sleep study was negative for OSA  IMPRESSIONS - No significant obstructive sleep apnea occurred during this study (AHI 0.4/h; RDI 1.3/h). - No significant central sleep apnea occurred during this study (CAI = 0.0/h). - Mild oxygen desaturation to a nadir of 84%. - The patient snored with moderate snoring volume. - Reduced sleep efficiency at 67.3%. - No cardiac abnormalities were noted during this study. - Clinically significant periodic limb movements did not occur during sleep. No significant associated arousals.  She comes today with complaints of worsening DOE, feeling tired, and retaining fluid. She states "I just don't feel right."  She states walking from the car to the waiting room today she was short of breath and felt her heart racing.    Past Medical History:  Diagnosis Date  . GERD (gastroesophageal reflux disease)   . HTN (hypertension)   . Hyperlipidemia   . TIA (transient ischemic attack)     Past Surgical History:  Procedure Laterality Date  . CARDIOVERSION N/A 01/04/2018   Procedure: CARDIOVERSION;  Surgeon: Dorothy Spark, MD;  Location: [facility] ENDOSCOPY;  Service: Cardiovascular;  Laterality: N/A;  . KNEE SURGERY Bilateral    Torn miniscus  . RECTOCELE REPAIR    . TONSILLECTOMY    . VAGINAL HYSTERECTOMY        Current Outpatient Medications  Medication Sig Dispense Refill  . acetaminophen (TYLENOL) 500 MG tablet Take 1,000 mg by mouth every 6 (six) hours as needed for moderate pain or headache.     Marland Kitchen amLODipine (NORVASC) 2.5 MG tablet Take 1 tablet (2.5 mg total) by mouth daily. 90 tablet 3  . Cholecalciferol (VITAMIN D3) 5000 units CAPS Take 5,000 Units by mouth at bedtime.    Marland Kitchen escitalopram (LEXAPRO) 10 MG tablet Take 10 mg by mouth at bedtime.     Marland Kitchen ezetimibe (ZETIA) 10 MG tablet Take 10 mg by mouth daily.     . famotidine (PEPCID) 20 MG tablet Take 20 mg by mouth 2 (two) times daily.     . furosemide (LASIX) 20 MG tablet Take 1 tablet (20 mg total) by mouth daily for 210 doses. 30 tablet 6  . metoprolol succinate (TOPROL-XL) 50 MG 24 hr tablet Take 50 mg by mouth daily.     . Potassium Chloride ER 20 MEQ TBCR Take 20 mEq by mouth daily. 90 tablet 3  . rivaroxaban (XARELTO) 20 MG TABS tablet Take 1 tablet (20 mg total) by mouth daily with supper. 30 tablet 11  . vitamin B-12 (CYANOCOBALAMIN) 1000 MCG tablet Take 1,000 mcg by mouth daily.     No current facility-administered medications for this visit.     Allergies:   Augmentin [amoxicillin-pot clavulanate]; Codeine; and Prednisone    Social History:  The patient  reports that she has  never smoked. She has never used smokeless tobacco. She reports that she does not drink alcohol or use drugs.   Family History:  The patient's family history includes Hyperlipidemia in her mother; Hypertension in her mother; Neurologic Disorder (age of onset: 70) in her mother; Prostate cancer in her brother; Stroke in her grandchild, maternal uncle, and son.    ROS: All other systems are reviewed and negative. Unless otherwise mentioned in H&P    PHYSICAL EXAM: VS:  BP (!) 154/96   Pulse (!) 53   Ht 5\' 5"  (1.651 m)   Wt 278 lb 3.2 oz (126.2 kg)   SpO2 94%   BMI 46.29 kg/m  , BMI Body mass index is 46.29 kg/m. GEN: Well nourished, well  developed, in no acute distress HEENT: normal Neck: no JVD, carotid bruits, or masses Cardiac: IRRR; no murmurs, rubs, or gallops,no edema  Respiratory:  Clear to auscultation bilaterally, normal work of breathing GI: soft, nontender, nondistended, + BS MS: no deformity or atrophy Skin: warm and dry, no rash Neuro:  Strength and sensation are intact Psych: euthymic mood, full affect   EKG:  Atrial fib, rate of 52 bpm with T-wave flattening inferiorly.   Recent Labs: 11/11/2017: BNP 469.6; TSH 3.820 12/14/2017: Platelets 233 12/16/2017: BUN 16; Creatinine, Ser 0.75 01/04/2018: Hemoglobin 12.9; Sodium 141 01/12/2018: Potassium 4.3    Lipid Panel    Component Value Date/Time   CHOL 202 (H) 02/27/2016 0519   TRIG 97 02/27/2016 0519   HDL 44 02/27/2016 0519   CHOLHDL 4.6 02/27/2016 0519   VLDL 19 02/27/2016 0519   LDLCALC 139 (H) 02/27/2016 0519      Wt Readings from Last 3 Encounters:  09/13/18 278 lb 3.2 oz (126.2 kg)  05/06/18 265 lb (120.2 kg)  04/13/18 270 lb (122.5 kg)      Other studies Reviewed: Echocardiogram 12-02-17  Left ventricle: The cavity size was normal. There was mild   concentric hypertrophy. Systolic function was normal. The   estimated ejection fraction was in the range of 60% to 65%. Wall   motion was normal; there were no regional wall motion   abnormalities. - Aortic valve: Transvalvular velocity was within the normal range.   There was no stenosis. There was no regurgitation. - Aorta: Ascending aortic diameter: 38 mm (S). - Ascending aorta: The ascending aorta was mildly dilated. - Mitral valve: Mildly calcified annulus. Transvalvular velocity   was within the normal range. There was no evidence for stenosis.   There was mild regurgitation. - Left atrium: The atrium was mildly dilated. - Right ventricle: The cavity size was normal. Wall thickness was   normal. Systolic function was normal. - Atrial septum: No defect or patent foramen ovale was  identified. - Tricuspid valve: There was mild-moderate regurgitation. - Pulmonic valve: There was moderate regurgitation. - Pulmonary arteries: Systolic pressure was severely increased. PA   peak pressure: 69 mm Hg (S).  ASSESSMENT AND PLAN:  1. Atrial fib: She has recurrence of this which is causing her to have symptoms of DOE, and fatigue She has been compliant with all of her medications and anticoagulation. She would like to try DCCV again, as this was so helpful the last time concerning her symptoms. She is aware that this may or may not keep her in NSR as she has reverted to atrial fib. I will plan for DCCV this month. She is to hold the metoprolol XL the night before secondary to bradycardia found on EKG in the  low 50's.  I have explained the procedure. She is familiar with this has she has had it in the past. She is wiling to proceed.   2. Hypertension: BP is elevated today. She has taken it at home and it has ranged in the 656-812 range systolic. She remains on amlodipine and metoprolol. She has some dependent edema from CCB. I have advised her to take her lasix 20 mg today and one extra today only. She will take extra potassium with the extra dose. I have ordered BMET and CBC in anticipation of DCCV. She is recommended for support hose as well.   3. Hx of TIA: No residual deficits.   4.Morbid Obesity: She is negative for OSA per sleep study in May of 2019.  Current medicines are reviewed at length with the patient today.    Labs/ tests ordered today include: DCCV,  BMET and CBC.   Phill Myron. West Pugh, ANP, AACC   09/13/2018 8:53 AM    [place] [address] Office [phone] Fax [phone]

## 2018-09-13 ENCOUNTER — Ambulatory Visit: Payer: Medicare Other | Admitting: Adult Health

## 2018-09-13 ENCOUNTER — Encounter: Payer: Self-pay | Admitting: Adult Health

## 2018-09-13 VITALS — BP 154/96 | HR 53 | Ht 65.0 in | Wt 278.2 lb

## 2018-09-13 DIAGNOSIS — I10 Essential (primary) hypertension: Secondary | ICD-10-CM

## 2018-09-13 DIAGNOSIS — Z79899 Other long term (current) drug therapy: Secondary | ICD-10-CM | POA: Diagnosis not present

## 2018-09-13 DIAGNOSIS — I4819 Other persistent atrial fibrillation: Secondary | ICD-10-CM | POA: Diagnosis not present

## 2018-09-13 LAB — BASIC METABOLIC PANEL
BUN/Creatinine Ratio: 17 (ref 12–28)
BUN: 12 mg/dL (ref 8–27)
CALCIUM: 9.1 mg/dL (ref 8.7–10.3)
CHLORIDE: 100 mmol/L (ref 96–106)
CO2: 24 mmol/L (ref 20–29)
CREATININE: 0.71 mg/dL (ref 0.57–1.00)
GFR calc Af Amer: 98 mL/min/{1.73_m2} (ref >59)
GFR calc non Af Amer: 85 mL/min/{1.73_m2} (ref >59)
Glucose: 99 mg/dL (ref 65–99)
Potassium: 4.2 mmol/L (ref 3.5–5.2)
Sodium: 139 mmol/L (ref 134–144)

## 2018-09-13 LAB — CBC
HEMOGLOBIN: 11.9 g/dL (ref 11.1–15.9)
Hematocrit: 36.8 % (ref 34.0–46.6)
MCH: 28.5 pg (ref 26.6–33.0)
MCHC: 32.3 g/dL (ref 31.5–35.7)
MCV: 88 fL (ref 79–97)
PLATELETS: 234 10*3/uL (ref 150–450)
RBC: 4.18 x10E6/uL (ref 3.77–5.28)
RDW: 14 % (ref 12.3–15.4)
WBC: 6.8 10*3/uL (ref 3.4–10.8)

## 2018-09-13 NOTE — Addendum Note (Signed)
Addended by: Lendon Colonel on: 09/13/2018 09:23 AM   Modules accepted: Orders, SmartSet

## 2018-09-13 NOTE — Patient Instructions (Addendum)
Medication Instructions:  Make take extra lasix and potassium today .  If you need a refill on your cardiac medications before your next appointment, please call your pharmacy.  Labwork: CBC AND BMET TODAY HERE IN OUR OFFICE AT [facility]  Take the provided lab slips with you to the lab for your blood draw.   If you have labs (blood work) drawn today and your tests are completely normal, you will receive your results only by: Marland Kitchen MyChart Message (if you have MyChart) OR . A paper copy in the mail If you have any lab test that is abnormal or we need to change your treatment, we will call you to review the results.  Testing/Procedures: Your physician has requested that you have a TEE/Cardioversion. During a TEE, sound waves are used to create images of your heart. It provides your doctor with information about the size and shape of your heart and how well your heart's chambers and valves are working. In this test, a transducer is attached to the end of a flexible tube that is guided down you throat and into your esophagus (the tube leading from your mouth to your stomach) to get a more detailed image of your heart. Once the TEE has determined that a blood clot is not present, the cardioversion begins. Electrical Cardioversion uses a jolt of electricity to your heart either through paddles or wired patches attached to your chest. This is a controlled, usually prescheduled, procedure. This procedure is done at the hospital and you are not awake during the procedure. You usually go home the day of the procedure. Please see the instruction sheet given to you today for more information. MAKE SURE TO HOLD METOPROLOL THE NIGHT BEFORE PROCEDURE, AND NOTHING TO EAT AFTER MIDNIGHT MAY TAKE OTHER MEDICATIONS WITH SIPS OF WATER THE MORNING OF THE PROCEDURE.   Please arrive at the [facility] (Main Entrance A) at [facility]: [address] [place], [place] [address] on 10-15 2019 at 1200 PM, WITH DR Meda Coffee.  Free valet parking service is available.   Special note: Every effort is made to have your procedure done on time. Please understand that emergencies sometimes delay scheduled procedures.   Special Instructions: PLEASE PURCHASE AND WEAR COMPRESSION STOCKINGS DAILY AND OFF AT BEDTIME. Compression stockings are elastic socks that squeeze the legs. They help to increase blood flow to the legs and to decrease swelling in the legs from fluid retention, and reduce the chance of developing blood clots in the lower legs.   Follow-Up: At [facility], you and your health needs are our priority.  As part of our continuing mission to provide you with exceptional heart care, we have created designated Provider Care Teams.  These Care Teams include your primary Cardiologist (physician) and Advanced Practice Providers (APPs -  Physician Assistants and Nurse Practitioners) who all work together to provide you with the care you need, when you need it. . You will need a follow up appointment in [place].   You may see  DR [facility] or one of the following Advanced Practice Providers on your designated Care Team:   . Rosaria Ferries, PA-C . Jory Sims, DNP, ANP   Thank you for choosing [facility] at [facility]!!

## 2018-09-19 NOTE — Anesthesia Preprocedure Evaluation (Addendum)
Anesthesia Evaluation  Patient identified by MRN, date of birth, ID band Patient awake    Reviewed: Allergy & Precautions, NPO status , Patient's Chart, lab work & pertinent test results  Airway Mallampati: II  TM Distance: >3 FB Neck ROM: Full    Dental no notable dental hx. (+) Teeth Intact, Dental Advisory Given   Pulmonary neg pulmonary ROS,    Pulmonary exam normal breath sounds clear to auscultation       Cardiovascular hypertension, Pt. on medications Normal cardiovascular exam+ dysrhythmias Atrial Fibrillation  Rhythm:Regular Rate:Normal     Neuro/Psych Anxiety TIAnegative neurological ROS  negative psych ROS   GI/Hepatic Neg liver ROS, GERD  ,  Endo/Other  Morbid obesity  Renal/GU      Musculoskeletal   Abdominal (+) + obese,   Peds  Hematology  (+) Blood dyscrasia, anemia ,   Anesthesia Other Findings   Reproductive/Obstetrics                            Anesthesia Physical Anesthesia Plan  ASA: III  Anesthesia Plan: General   Post-op Pain Management:    Induction:   PONV Risk Score and Plan: 3 and Treatment may vary due to age or medical condition  Airway Management Planned: Nasal Cannula and Natural Airway  Additional Equipment:   Intra-op Plan:   Post-operative Plan:   Informed Consent: I have reviewed the patients History and Physical, chart, labs and discussed the procedure including the risks, benefits and alternatives for the proposed anesthesia with the patient or authorized representative who has indicated his/her understanding and acceptance.   Dental advisory given  Plan Discussed with:   Anesthesia Plan Comments:         Anesthesia Quick Evaluation

## 2018-09-20 ENCOUNTER — Encounter (HOSPITAL_COMMUNITY)
Admission: RE | Disposition: A | Discharge status: Home / Self Care | Payer: Self-pay | Source: Ambulatory Visit | Attending: Cardiology

## 2018-09-20 ENCOUNTER — Telehealth: Payer: Self-pay | Admitting: Cardiology

## 2018-09-20 ENCOUNTER — Ambulatory Visit (HOSPITAL_COMMUNITY): Payer: Medicare Other | Admitting: Anesthesiology

## 2018-09-20 ENCOUNTER — Ambulatory Visit (HOSPITAL_COMMUNITY)
Admission: RE | Admit: 2018-09-20 | Discharge: 2018-09-20 | Disposition: A | Discharge status: Home / Self Care | Payer: Medicare Other | Source: Ambulatory Visit | Attending: Cardiology | Admitting: Cardiology

## 2018-09-20 DIAGNOSIS — I480 Paroxysmal atrial fibrillation: Secondary | ICD-10-CM | POA: Diagnosis not present

## 2018-09-20 DIAGNOSIS — Z6841 Body Mass Index (BMI) 40.0 and over, adult: Secondary | ICD-10-CM | POA: Insufficient documentation

## 2018-09-20 DIAGNOSIS — K219 Gastro-esophageal reflux disease without esophagitis: Secondary | ICD-10-CM | POA: Insufficient documentation

## 2018-09-20 DIAGNOSIS — I4891 Unspecified atrial fibrillation: Secondary | ICD-10-CM | POA: Insufficient documentation

## 2018-09-20 DIAGNOSIS — I10 Essential (primary) hypertension: Secondary | ICD-10-CM | POA: Insufficient documentation

## 2018-09-20 DIAGNOSIS — Z7901 Long term (current) use of anticoagulants: Secondary | ICD-10-CM | POA: Insufficient documentation

## 2018-09-20 DIAGNOSIS — E785 Hyperlipidemia, unspecified: Secondary | ICD-10-CM | POA: Insufficient documentation

## 2018-09-20 DIAGNOSIS — Z8673 Personal history of transient ischemic attack (TIA), and cerebral infarction without residual deficits: Secondary | ICD-10-CM | POA: Insufficient documentation

## 2018-09-20 DIAGNOSIS — Z880 Allergy status to penicillin: Secondary | ICD-10-CM | POA: Diagnosis not present

## 2018-09-20 DIAGNOSIS — Z8249 Family history of ischemic heart disease and other diseases of the circulatory system: Secondary | ICD-10-CM | POA: Diagnosis not present

## 2018-09-20 HISTORY — PX: CARDIOVERSION: SHX1299

## 2018-09-20 SURGERY — CARDIOVERSION
Anesthesia: General

## 2018-09-20 NOTE — CV Procedure (Signed)
   Electrical Cardioversion Procedure Note [name] Collier [id number] June 30, 1946  Procedure: Electrical Cardioversion Indications:  Atrial Fibrillation  Time Out: Verified patient identification, verified procedure,medications/allergies/relevent history reviewed, required imaging and test results available.  Performed  Procedure Details  The patient was NPO after midnight. Anesthesia was administered at the beside  by Dr.Houser with 70mg  of propofol and Lidocaine 100mg .  Cardioversion was done with synchronized biphasic defibrillation with AP pads with 150watts.  The patient failed to convert to normal sinus rhythm. Cardioversion was done with synchronized biphasic defibrillation with AP pads with 200watts.  The patient failed to convert to normal sinus rhythm. Cardioversion was done with synchronized biphasic defibrillation again with AP pads with 200watts.  The patient failed to convert to normal sinus rhythm.The patient tolerated the procedure well   IMPRESSION:  Unsuccessful cardioversion of atrial fibrillation The patient will be referred to afib clinic for consideration of antiarrhythmic drug therapy.     [name] 09/20/2018, 12:35 PM

## 2018-09-20 NOTE — Telephone Encounter (Signed)
Spoke to patient to arrange A fib clinic visit 10/16 @ 3:00pm per Dr Radford Pax post failed Cardioversion.

## 2018-09-20 NOTE — Anesthesia Postprocedure Evaluation (Signed)
Anesthesia Post Note  Patient: [name] Collier  Procedure(s) Performed: CARDIOVERSION (N/A )     Patient location during evaluation: Endoscopy Anesthesia Type: General Level of consciousness: awake and alert Pain management: pain level controlled Vital Signs Assessment: post-procedure vital signs reviewed and stable Respiratory status: spontaneous breathing, nonlabored ventilation, respiratory function stable and patient connected to nasal cannula oxygen Cardiovascular status: blood pressure returned to baseline and stable Postop Assessment: no apparent nausea or vomiting Anesthetic complications: no    Last Vitals:  Vitals:   09/20/18 1330 09/20/18 1334  BP:  (!) 161/59  Pulse: (!) 53 (!) 59  Resp: (!) 29 (!) 26  Temp:    SpO2: 93% 94%    Last Pain:  Vitals:   09/20/18 1334  TempSrc:   PainSc: 0-No pain                 Barnet Glasgow

## 2018-09-20 NOTE — Transfer of Care (Signed)
Immediate Anesthesia Transfer of Care Note  Patient: [name] Collier  Procedure(s) Performed: CARDIOVERSION (N/A )  Patient Location: PACU and Endoscopy Unit  Anesthesia Type:General  Level of Consciousness: patient cooperative and responds to stimulation  Airway & Oxygen Therapy: Patient Spontanous Breathing  Post-op Assessment: Report given to RN and Post -op Vital signs reviewed and stable  Post vital signs: Reviewed and stable  Last Vitals:  Vitals Value Taken Time  BP    Temp    Pulse    Resp    SpO2      Last Pain:  Vitals:   09/20/18 1215  TempSrc: Oral  PainSc: 0-No pain         Complications: No apparent anesthesia complications

## 2018-09-20 NOTE — Telephone Encounter (Signed)
Please set patient up in afib clinic ASAP

## 2018-09-20 NOTE — Interval H&P Note (Signed)
History and Physical Interval Note:  09/20/2018 12:38 PM  [name] Collier  has presented today for surgery, with the diagnosis of afib  The various methods of treatment have been discussed with the patient and family. After consideration of risks, benefits and other options for treatment, the patient has consented to  Procedure(s): CARDIOVERSION (N/A) as a surgical intervention .  The patient's history has been reviewed, patient examined, no change in status, stable for surgery.  I have reviewed the patient's chart and labs.  Questions were answered to the patient's satisfaction.     Fransico Him

## 2018-09-20 NOTE — Discharge Instructions (Signed)
Electrical Cardioversion, Care After °This sheet gives you information about how to care for yourself after your procedure. Your health care provider may also give you more specific instructions. If you have problems or questions, contact your health care provider. °What can I expect after the procedure? °After the procedure, it is common to have: °· Some redness on the skin where the shocks were given. ° °Follow these instructions at home: °· Do not drive for 24 hours if you were given a medicine to help you relax (sedative). °· Take over-the-counter and prescription medicines only as told by your health care provider. °· Ask your health care provider how to check your pulse. Check it often. °· Rest for 48 hours after the procedure or as told by your health care provider. °· Avoid or limit your caffeine use as told by your health care provider. °Contact a health care provider if: °· You feel like your heart is beating too quickly or your pulse is not regular. °· You have a serious muscle cramp that does not go away. °Get help right away if: °· You have discomfort in your chest. °· You are dizzy or you feel faint. °· You have trouble breathing or you are short of breath. °· Your speech is slurred. °· You have trouble moving an arm or leg on one side of your body. °· Your fingers or toes turn cold or blue. °This information is not intended to replace advice given to you by your health care provider. Make sure you discuss any questions you have with your health care provider. °Document Released: 09/13/2013 Document Revised: 06/26/2016 Document Reviewed: 05/29/2016 °Elsevier Interactive Patient Education © 2018 [employer] ° °

## 2018-09-21 ENCOUNTER — Encounter (HOSPITAL_COMMUNITY): Payer: Self-pay | Admitting: Nurse Practitioner

## 2018-09-21 ENCOUNTER — Ambulatory Visit (HOSPITAL_COMMUNITY)
Admission: RE | Admit: 2018-09-21 | Discharge: 2018-09-21 | Disposition: A | Discharge status: Home / Self Care | Payer: Medicare Other | Source: Ambulatory Visit | Attending: Nurse Practitioner | Admitting: Nurse Practitioner

## 2018-09-21 VITALS — BP 148/86 | HR 72 | Ht 65.0 in | Wt 274.0 lb

## 2018-09-21 DIAGNOSIS — Z7901 Long term (current) use of anticoagulants: Secondary | ICD-10-CM | POA: Insufficient documentation

## 2018-09-21 DIAGNOSIS — Z79899 Other long term (current) drug therapy: Secondary | ICD-10-CM | POA: Diagnosis not present

## 2018-09-21 DIAGNOSIS — K219 Gastro-esophageal reflux disease without esophagitis: Secondary | ICD-10-CM | POA: Insufficient documentation

## 2018-09-21 DIAGNOSIS — Z9889 Other specified postprocedural states: Secondary | ICD-10-CM | POA: Diagnosis not present

## 2018-09-21 DIAGNOSIS — Z823 Family history of stroke: Secondary | ICD-10-CM | POA: Diagnosis not present

## 2018-09-21 DIAGNOSIS — I10 Essential (primary) hypertension: Secondary | ICD-10-CM | POA: Diagnosis not present

## 2018-09-21 DIAGNOSIS — Z880 Allergy status to penicillin: Secondary | ICD-10-CM | POA: Diagnosis not present

## 2018-09-21 DIAGNOSIS — Z8673 Personal history of transient ischemic attack (TIA), and cerebral infarction without residual deficits: Secondary | ICD-10-CM | POA: Insufficient documentation

## 2018-09-21 DIAGNOSIS — I4819 Other persistent atrial fibrillation: Secondary | ICD-10-CM | POA: Diagnosis present

## 2018-09-21 DIAGNOSIS — Z888 Allergy status to other drugs, medicaments and biological substances status: Secondary | ICD-10-CM | POA: Insufficient documentation

## 2018-09-21 DIAGNOSIS — Z8249 Family history of ischemic heart disease and other diseases of the circulatory system: Secondary | ICD-10-CM | POA: Insufficient documentation

## 2018-09-21 DIAGNOSIS — E785 Hyperlipidemia, unspecified: Secondary | ICD-10-CM | POA: Diagnosis not present

## 2018-09-21 DIAGNOSIS — R9431 Abnormal electrocardiogram [ECG] [EKG]: Secondary | ICD-10-CM | POA: Insufficient documentation

## 2018-09-21 DIAGNOSIS — Z8349 Family history of other endocrine, nutritional and metabolic diseases: Secondary | ICD-10-CM | POA: Diagnosis not present

## 2018-09-21 DIAGNOSIS — Z885 Allergy status to narcotic agent status: Secondary | ICD-10-CM | POA: Insufficient documentation

## 2018-09-21 NOTE — Progress Notes (Signed)
Primary Care Physician: [name] Housekeeper, MD Referring Physician: Dr. Radford Collier Cardiologist: Dr. Shella Collier is a 72 y.o. female with a h/o afib, HTN, hyperlipidemia and TIA. SHe had a cardioversion in January of 2018 and it held until recently when she was found to be back in afib. This was associated with increase in LLE and shortness of breath/fatigue. She was scheduled for cardioversion yesterday which was unsuccessful. She is now in the afib clinic to  further discuss options to restore SR. She  had a sleep study and was not found to have any sleep apnea. No alcohol use. No tobbacco or excessive caffeine.. She is obese and has chronic  issues with LLE, improved with lasix.  Today, she denies symptoms of palpitations, chest pain, shortness of breath, orthopnea, PND, lower extremity edema, dizziness, presyncope, syncope, or neurologic sequela. The patient is tolerating medications without difficulties and is otherwise without complaint today.   Past Medical History:  Diagnosis Date  . GERD (gastroesophageal reflux disease)   . HTN (hypertension)   . Hyperlipidemia   . TIA (transient ischemic attack)    Past Surgical History:  Procedure Laterality Date  . CARDIOVERSION N/A 01/04/2018   Procedure: CARDIOVERSION;  Surgeon: [name] Spark, MD;  [facility] ENDOSCOPY;  Service: Cardiovascular;  Laterality: N/A;  . KNEE SURGERY Bilateral    Torn miniscus  . RECTOCELE REPAIR    . TONSILLECTOMY    . VAGINAL HYSTERECTOMY      Current Outpatient Medications  Medication Sig Dispense Refill  . acetaminophen (TYLENOL) 500 MG tablet Take 1,000 mg by mouth daily as needed for moderate pain or headache.     Marland Kitchen amLODipine (NORVASC) 2.5 MG tablet Take 1 tablet (2.5 mg total) by mouth daily. 90 tablet 3  . Cholecalciferol (VITAMIN D3) 5000 units CAPS Take 5,000 Units by mouth at bedtime.    Marland Kitchen escitalopram (LEXAPRO) 10 MG tablet Take 10 mg by mouth at bedtime.     Marland Kitchen ezetimibe  (ZETIA) 10 MG tablet Take 10 mg by mouth daily.     . famotidine (PEPCID) 20 MG tablet Take 20 mg by mouth 2 (two) times daily.     . furosemide (LASIX) 20 MG tablet Take 1 tablet (20 mg total) by mouth daily for 210 doses. (Patient taking differently: Take 20 mg by mouth every morning. ) 30 tablet 6  . Potassium Chloride ER 20 MEQ TBCR Take 20 mEq by mouth daily. (Patient taking differently: Take 20 mEq by mouth every evening. ) 90 tablet 3  . rivaroxaban (XARELTO) 20 MG TABS tablet Take 1 tablet (20 mg total) by mouth daily with supper. 30 tablet 11  . vitamin B-12 (CYANOCOBALAMIN) 1000 MCG tablet Take 1,000 mcg by mouth daily.     No current facility-administered medications for this encounter.     Allergies  Allergen Reactions  . Augmentin [Amoxicillin-Pot Clavulanate] Other (See Comments)    c-diff Has patient had a PCN reaction causing immediate rash, facial/tongue/throat swelling, SOB or lightheadedness with hypotension: No Has patient had a PCN reaction causing severe rash involving mucus membranes or skin necrosis: No Has patient had a PCN reaction that required hospitalization: No Has patient had a PCN reaction occurring within the last 10 years: Yes If all of the above answers are "NO", then may proceed with Cephalosporin use.   . Codeine Palpitations and Rash  . Prednisone Other (See Comments)    Jittery, red in the face    Social History  Socioeconomic History  . Marital status: Widowed    Spouse name: Not on file  . Number of children: 2  . Years of education: 54  . Highest education level: Not on file  Occupational History  . Not on file  Social Needs  . Financial resource strain: Not on file  . Food insecurity:    Worry: Not on file    Inability: Not on file  . Transportation needs:    Medical: Not on file    Non-medical: Not on file  Tobacco Use  . Smoking status: Never Smoker  . Smokeless tobacco: Never Used  Substance and Sexual Activity  . Alcohol  use: No    Alcohol/week: 0.0 standard drinks  . Drug use: No  . Sexual activity: Not on file  Lifestyle  . Physical activity:    Days per week: Not on file    Minutes per session: Not on file  . Stress: Not on file  Relationships  . Social connections:    Talks on phone: Not on file    Gets together: Not on file    Attends religious service: Not on file    Active member of club or organization: Not on file    Attends meetings of clubs or organizations: Not on file    Relationship status: Not on file  . Intimate partner violence:    Fear of current or ex partner: Not on file    Emotionally abused: Not on file    Physically abused: Not on file    Forced sexual activity: Not on file  Other Topics Concern  . Not on file  Social History Narrative   Lives alone   Caffeine use: Soda/tea daily    Family History  Problem Relation Age of Onset  . Hypertension Mother   . Hyperlipidemia Mother   . Neurologic Disorder Mother 67       GB  . Stroke Son   . Stroke Maternal Uncle   . Stroke Grandchild   . Prostate cancer Brother     ROS- All systems are reviewed and negative except as per the HPI above  Physical Exam: Vitals:   09/21/18 1451  BP: (!) 148/86  Pulse: 72  Weight: 124.3 kg  Height: 5\' 5"  (1.651 m)   Wt Readings from Last 3 Encounters:  09/21/18 124.3 kg  09/13/18 126.2 kg  05/06/18 120.2 kg    Labs: Lab Results  Component Value Date   NA 139 09/13/2018   K 4.2 09/13/2018   CL 100 09/13/2018   CO2 24 09/13/2018   GLUCOSE 99 09/13/2018   BUN 12 09/13/2018   CREATININE 0.71 09/13/2018   CALCIUM 9.1 09/13/2018   Lab Results  Component Value Date   INR 1.10 02/26/2016   Lab Results  Component Value Date   CHOL 202 (H) 02/27/2016   HDL 44 02/27/2016   LDLCALC 139 (H) 02/27/2016   TRIG 97 02/27/2016     GEN- The patient is well appearing, alert and oriented x 3 today.   Head- normocephalic, atraumatic Eyes-  Sclera clear, conjunctiva  pink Ears- hearing intact Oropharynx- clear Neck- supple, no JVP Lymph- no cervical lymphadenopathy Lungs- Clear to ausculation bilaterally, normal work of breathing Heart- irregular rate and rhythm, no murmurs, rubs or gallops, PMI not laterally displaced GI- soft, NT, ND, + BS Extremities- no clubbing, cyanosis, + 1  LLE MS- no significant deformity or atrophy Skin- no rash or lesion Psych- euthymic mood, full affect Neuro- strength  and sensation are intact  EKG-afib at 72 bpm, qrs int 80 ms, qtc 418 ms Echo-Study Conclusions  - Left ventricle: The cavity size was normal. There was mild   concentric hypertrophy. Systolic function was normal. The   estimated ejection fraction was in the range of 60% to 65%. Wall   motion was normal; there were no regional wall motion   abnormalities. - Aortic valve: Transvalvular velocity was within the normal range.   There was no stenosis. There was no regurgitation. - Aorta: Ascending aortic diameter: 38 mm (S). - Ascending aorta: The ascending aorta was mildly dilated. - Mitral valve: Mildly calcified annulus. Transvalvular velocity   was within the normal range. There was no evidence for stenosis.   There was mild regurgitation. - Left atrium: The atrium was mildly dilated.LAID 57 mm, volume 78 ml - Right ventricle: The cavity size was normal. Wall thickness was   normal. Systolic function was normal. - Atrial septum: No defect or patent foramen ovale was identified. - Tricuspid valve: There was mild-moderate regurgitation. - Pulmonic valve: There was moderate regurgitation. - Pulmonary arteries: Systolic pressure was severely increased. PA   peak pressure: 69 mm Hg (S).    Assessment and Plan: 1. Persistent afib Failed cardioversion Options discussed to pursue SR With pt's issues with fluid retention and cost, I don't think multaq best option Flecainide not an option duet to significant first degree AV block in SR at 376 ms Pt  not in favor of SE profile of amiodarone Sotalol and Tikosyn discussed and she would like to purse tikosyn She will check on price of drug and dates to come in to hospital and let us know to get scheduled No missed doses of anticoagulation No benadryl use Meds  screened by PharmD, lexapro has potential   qtc  prolongation and pt may have to wean off, she thinks this is possible, or change to Cymbalta  Current qtc 418 ms I am concerned with size of left atrium/obesity, if we will be able to maintain SR in the long run  [name] Collier, [facility] [address] [place], [place] [address] [phone]

## 2018-09-22 ENCOUNTER — Telehealth: Payer: Self-pay | Admitting: Pharmacist

## 2018-09-22 NOTE — Telephone Encounter (Signed)
Medication list reviewed in anticipation of possible Tikosyn initiation. Patient is not taking any contraindicated medications, however she does take escitalopram which is QTc prolonging. Will need to monitor QTc closely and would consider changing to non-QTc prolonging SNRI like duloxetine if able.  Patient is anticoagulated on Xarelto 20mg  daily on the appropriate dose. Please ensure that patient has not missed any anticoagulation doses in the 3 weeks prior to Tikosyn initiation.   Patient will need to be counseled to avoid use of Benadryl while on Tikosyn and in the 2-3 days prior to Tikosyn initiation.

## 2018-09-28 NOTE — Telephone Encounter (Signed)
Patient to call PCP to switch from lexapro to cymbalta.

## 2018-10-03 NOTE — Progress Notes (Deleted)
Cardiology Office Note   Date:  10/03/2018   ID:  [name] Collier, DOB [birth year], MRN [id number]  PCP:  Dione Housekeeper, MD  Cardiologist: Hochrein  No chief complaint on file.    History of Present Illness: [name] Collier is a 72 y.o. female who presents for ongoing assessment of dyspnea, atrial fib, hypertension, hyperlipidemia, and a TIA. She has been tested for OSA and found to be negative. On last office visit, she complained of worsening dyspnea and racing HR. She was found to be in atrial fib. She was scheduled DCCV as she usually felt better after cardioversion and in NSR. This procedure was completed on 09/20/2018 and was unsuccessful after 3 attempts. She was referred to the Afib clinic.   She was recommended for Tikosyn but would have to be admitted for loading She was going to think about it. There are concerns about her obesity and OSA not allowing for maintenance of NSR.      Past Medical History:  Diagnosis Date  . GERD (gastroesophageal reflux disease)   . HTN (hypertension)   . Hyperlipidemia   . TIA (transient ischemic attack)     Past Surgical History:  Procedure Laterality Date  . CARDIOVERSION N/A 01/04/2018   Procedure: CARDIOVERSION;  Surgeon: Dorothy Spark, MD;  Location: [facility] ENDOSCOPY;  Service: Cardiovascular;  Laterality: N/A;  . CARDIOVERSION N/A 09/20/2018   Procedure: CARDIOVERSION;  Surgeon: Sueanne Margarita, MD;  Location: [facility] ENDOSCOPY;  Service: Cardiovascular;  Laterality: N/A;  . KNEE SURGERY Bilateral    Torn miniscus  . RECTOCELE REPAIR    . TONSILLECTOMY    . VAGINAL HYSTERECTOMY       Current Outpatient Medications  Medication Sig Dispense Refill  . acetaminophen (TYLENOL) 500 MG tablet Take 1,000 mg by mouth daily as needed for moderate pain or headache.     Marland Kitchen amLODipine (NORVASC) 2.5 MG tablet Take 1 tablet (2.5 mg total) by mouth daily. 90 tablet 3  . Cholecalciferol (VITAMIN D3) 5000 units CAPS Take 5,000 Units by mouth at  bedtime.    Marland Kitchen escitalopram (LEXAPRO) 10 MG tablet Take 10 mg by mouth at bedtime.     Marland Kitchen ezetimibe (ZETIA) 10 MG tablet Take 10 mg by mouth daily.     . famotidine (PEPCID) 20 MG tablet Take 20 mg by mouth 2 (two) times daily.     . furosemide (LASIX) 20 MG tablet Take 1 tablet (20 mg total) by mouth daily for 210 doses. (Patient taking differently: Take 20 mg by mouth every morning. ) 30 tablet 6  . Potassium Chloride ER 20 MEQ TBCR Take 20 mEq by mouth daily. (Patient taking differently: Take 20 mEq by mouth every evening. ) 90 tablet 3  . rivaroxaban (XARELTO) 20 MG TABS tablet Take 1 tablet (20 mg total) by mouth daily with supper. 30 tablet 11  . vitamin B-12 (CYANOCOBALAMIN) 1000 MCG tablet Take 1,000 mcg by mouth daily.     No current facility-administered medications for this visit.     Allergies:   Augmentin [amoxicillin-pot clavulanate]; Codeine; and Prednisone    Social History:  The patient  reports that she has never smoked. She has never used smokeless tobacco. She reports that she does not drink alcohol or use drugs.   Family History:  The patient's family history includes Hyperlipidemia in her mother; Hypertension in her mother; Neurologic Disorder (age of onset: 62) in her mother; Prostate cancer in her brother; Stroke in her grandchild, maternal uncle, and  son.    ROS: All other systems are reviewed and negative. Unless otherwise mentioned in H&P    PHYSICAL EXAM: VS:  There were no vitals taken for this visit. , BMI There is no height or weight on file to calculate BMI. GEN: Well nourished, well developed, in no acute distress HEENT: normal Neck: no JVD, carotid bruits, or masses Cardiac: ***RRR; no murmurs, rubs, or gallops,no edema  Respiratory:  Clear to auscultation bilaterally, normal work of breathing GI: soft, nontender, nondistended, + BS MS: no deformity or atrophy Skin: warm and dry, no rash Neuro:  Strength and sensation are intact Psych: euthymic mood,  full affect   EKG:  EKG {ACTION; IS/IS [id number]:[id number]} ordered today. The ekg ordered today demonstrates ***   Recent Labs: 11/11/2017: BNP 469.6; TSH 3.820 09/13/2018: BUN 12; Creatinine, Ser 0.71; Hemoglobin 11.9; Platelets 234; Potassium 4.2; Sodium 139    Lipid Panel    Component Value Date/Time   CHOL 202 (H) 02/27/2016 0519   TRIG 97 02/27/2016 0519   HDL 44 02/27/2016 0519   CHOLHDL 4.6 02/27/2016 0519   VLDL 19 02/27/2016 0519   LDLCALC 139 (H) 02/27/2016 0519      Wt Readings from Last 3 Encounters:  09/21/18 274 lb (124.3 kg)  09/13/18 278 lb 3.2 oz (126.2 kg)  05/06/18 265 lb (120.2 kg)      Other studies Reviewed: Additional studies/ records that were reviewed today include: ***. Review of the above records demonstrates: ***   ASSESSMENT AND PLAN:  1.  ***   Current medicines are reviewed at length with the patient today.    Labs/ tests ordered today include: *** Phill Myron. [place] Pugh, ANP, AACC   10/03/2018 1:10 PM    [place] [address] Office [phone] Fax [phone]

## 2018-10-05 ENCOUNTER — Ambulatory Visit: Payer: Medicare Other | Admitting: Adult Health

## 2018-10-25 ENCOUNTER — Ambulatory Visit (HOSPITAL_COMMUNITY)
Admission: RE | Admit: 2018-10-25 | Discharge: 2018-10-25 | Disposition: A | Discharge status: Home / Self Care | Payer: Medicare Other | Source: Ambulatory Visit | Attending: Nurse Practitioner | Admitting: Nurse Practitioner

## 2018-10-25 ENCOUNTER — Inpatient Hospital Stay (HOSPITAL_COMMUNITY)
Admission: AD | Admit: 2018-10-25 | Discharge: 2018-10-28 | DRG: 309 | Disposition: A | Discharge status: Home / Self Care | Payer: Medicare Other | Attending: Internal Medicine | Admitting: Internal Medicine

## 2018-10-25 ENCOUNTER — Encounter (HOSPITAL_COMMUNITY): Payer: Self-pay | Admitting: Nurse Practitioner

## 2018-10-25 ENCOUNTER — Other Ambulatory Visit: Payer: Self-pay

## 2018-10-25 VITALS — BP 126/82 | HR 76 | Ht 65.0 in | Wt 266.0 lb

## 2018-10-25 DIAGNOSIS — Z880 Allergy status to penicillin: Secondary | ICD-10-CM | POA: Diagnosis not present

## 2018-10-25 DIAGNOSIS — I484 Atypical atrial flutter: Secondary | ICD-10-CM | POA: Diagnosis present

## 2018-10-25 DIAGNOSIS — Z820 Family history of epilepsy and other diseases of the nervous system: Secondary | ICD-10-CM | POA: Diagnosis not present

## 2018-10-25 DIAGNOSIS — I4819 Other persistent atrial fibrillation: Secondary | ICD-10-CM

## 2018-10-25 DIAGNOSIS — Z823 Family history of stroke: Secondary | ICD-10-CM

## 2018-10-25 DIAGNOSIS — Z888 Allergy status to other drugs, medicaments and biological substances status: Secondary | ICD-10-CM | POA: Diagnosis not present

## 2018-10-25 DIAGNOSIS — E785 Hyperlipidemia, unspecified: Secondary | ICD-10-CM | POA: Diagnosis present

## 2018-10-25 DIAGNOSIS — Z8673 Personal history of transient ischemic attack (TIA), and cerebral infarction without residual deficits: Secondary | ICD-10-CM | POA: Diagnosis not present

## 2018-10-25 DIAGNOSIS — Z6841 Body Mass Index (BMI) 40.0 and over, adult: Secondary | ICD-10-CM

## 2018-10-25 DIAGNOSIS — Z90710 Acquired absence of both cervix and uterus: Secondary | ICD-10-CM

## 2018-10-25 DIAGNOSIS — I10 Essential (primary) hypertension: Secondary | ICD-10-CM | POA: Diagnosis present

## 2018-10-25 DIAGNOSIS — I440 Atrioventricular block, first degree: Secondary | ICD-10-CM | POA: Diagnosis present

## 2018-10-25 DIAGNOSIS — Z9089 Acquired absence of other organs: Secondary | ICD-10-CM

## 2018-10-25 DIAGNOSIS — Z8249 Family history of ischemic heart disease and other diseases of the circulatory system: Secondary | ICD-10-CM | POA: Diagnosis not present

## 2018-10-25 DIAGNOSIS — Z885 Allergy status to narcotic agent status: Secondary | ICD-10-CM

## 2018-10-25 DIAGNOSIS — Z8349 Family history of other endocrine, nutritional and metabolic diseases: Secondary | ICD-10-CM

## 2018-10-25 DIAGNOSIS — Z8042 Family history of malignant neoplasm of prostate: Secondary | ICD-10-CM | POA: Diagnosis not present

## 2018-10-25 LAB — BASIC METABOLIC PANEL
ANION GAP: 10 (ref 5–15)
ANION GAP: 8 (ref 5–15)
Anion gap: 8 (ref 5–15)
BUN: 14 mg/dL (ref 8–23)
BUN: 14 mg/dL (ref 8–23)
BUN: 16 mg/dL (ref 8–23)
CALCIUM: 9.3 mg/dL (ref 8.9–10.3)
CO2: 24 mmol/L (ref 22–32)
CO2: 26 mmol/L (ref 22–32)
CO2: 26 mmol/L (ref 22–32)
CREATININE: 0.83 mg/dL (ref 0.44–1.00)
CREATININE: 0.9 mg/dL (ref 0.44–1.00)
Calcium: 9.2 mg/dL (ref 8.9–10.3)
Calcium: 9.5 mg/dL (ref 8.9–10.3)
Chloride: 105 mmol/L (ref 98–111)
Chloride: 106 mmol/L (ref 98–111)
Chloride: 104 mmol/L (ref 98–111)
Creatinine, Ser: 0.93 mg/dL (ref 0.44–1.00)
GFR calc Af Amer: >60 mL/min (ref >60)
GFR calc non Af Amer: >60 mL/min (ref >60)
GFR calc non Af Amer: 60 mL/min — ABNORMAL LOW (ref >60)
GFR calc non Af Amer: >60 mL/min (ref >60)
GLUCOSE: 94 mg/dL (ref 70–99)
Glucose, Bld: 106 mg/dL — ABNORMAL HIGH (ref 70–99)
Glucose, Bld: 121 mg/dL — ABNORMAL HIGH (ref 70–99)
POTASSIUM: 3.7 mmol/L (ref 3.5–5.1)
POTASSIUM: 3.4 mmol/L — AB (ref 3.5–5.1)
Potassium: 4.6 mmol/L (ref 3.5–5.1)
SODIUM: 139 mmol/L (ref 135–145)
Sodium: 140 mmol/L (ref 135–145)
Sodium: 138 mmol/L (ref 135–145)

## 2018-10-25 LAB — MAGNESIUM: MAGNESIUM: 2 mg/dL (ref 1.7–2.4)

## 2018-10-25 MED ORDER — AMLODIPINE BESYLATE 2.5 MG PO TABS
2.5000 mg | ORAL_TABLET | Freq: Every day | ORAL | Status: DC
  Administered 2018-10-26: 2.5 mg via ORAL
  Filled 2018-10-25: qty 1

## 2018-10-25 MED ORDER — POTASSIUM CHLORIDE CRYS ER 20 MEQ PO TBCR
20.0000 meq | EXTENDED_RELEASE_TABLET | Freq: Every day | ORAL | Status: DC
  Administered 2018-10-26 – 2018-10-28 (×3): 20 meq via ORAL
  Filled 2018-10-25 (×3): qty 1

## 2018-10-25 MED ORDER — SODIUM CHLORIDE 0.9 % IV SOLN: 250.0000 mL | INTRAVENOUS | Status: DC | PRN

## 2018-10-25 MED ORDER — FAMOTIDINE 20 MG PO TABS
20.0000 mg | ORAL_TABLET | Freq: Two times a day (BID) | ORAL | Status: DC
  Administered 2018-10-25 – 2018-10-28 (×6): 20 mg via ORAL
  Filled 2018-10-25 (×6): qty 1

## 2018-10-25 MED ORDER — FUROSEMIDE 20 MG PO TABS
20.0000 mg | ORAL_TABLET | Freq: Every day | ORAL | Status: DC
  Administered 2018-10-26 – 2018-10-28 (×3): 20 mg via ORAL
  Filled 2018-10-25 (×3): qty 1

## 2018-10-25 MED ORDER — EZETIMIBE 10 MG PO TABS
10.0000 mg | ORAL_TABLET | Freq: Every day | ORAL | Status: DC
  Administered 2018-10-26 – 2018-10-28 (×3): 10 mg via ORAL
  Filled 2018-10-25 (×3): qty 1

## 2018-10-25 MED ORDER — POTASSIUM CHLORIDE CRYS ER 20 MEQ PO TBCR: 40.0000 meq | EXTENDED_RELEASE_TABLET | Freq: Once | ORAL | 0 refills | Status: DC

## 2018-10-25 MED ORDER — SODIUM CHLORIDE 0.9% FLUSH: 3.0000 mL | INTRAVENOUS | Status: DC | PRN

## 2018-10-25 MED ORDER — DOFETILIDE 500 MCG PO CAPS
500.0000 ug | ORAL_CAPSULE | Freq: Two times a day (BID) | ORAL | Status: DC
  Administered 2018-10-25 – 2018-10-28 (×6): 500 ug via ORAL
  Filled 2018-10-25 (×6): qty 1

## 2018-10-25 MED ORDER — RIVAROXABAN 20 MG PO TABS
20.0000 mg | ORAL_TABLET | Freq: Every day | ORAL | Status: DC
  Administered 2018-10-25 – 2018-10-27 (×3): 20 mg via ORAL
  Filled 2018-10-25 (×3): qty 1

## 2018-10-25 MED ORDER — POTASSIUM CHLORIDE CRYS ER 20 MEQ PO TBCR
60.0000 meq | EXTENDED_RELEASE_TABLET | Freq: Once | ORAL | Status: AC
  Administered 2018-10-25: 60 meq via ORAL
  Filled 2018-10-25: qty 3

## 2018-10-25 MED ORDER — SODIUM CHLORIDE 0.9% FLUSH
3.0000 mL | Freq: Two times a day (BID) | INTRAVENOUS | Status: DC
  Administered 2018-10-26 (×2): 3 mL via INTRAVENOUS

## 2018-10-25 MED ORDER — ACETAMINOPHEN 500 MG PO TABS: 1000.0000 mg | ORAL_TABLET | Freq: Every day | ORAL | Status: DC | PRN

## 2018-10-25 NOTE — Progress Notes (Signed)
Pharmacy Review for Dofetilide (Tikosyn) Initiation  Admit Complaint: 72 y.o. female admitted 10/25/2018 with atrial fibrillation to be initiated on dofetilide.   Assessment:  Patient Exclusion Criteria: If any screening criteria checked as "Yes", then  patient  should NOT receive dofetilide until criteria item is corrected. If "Yes" please indicate correction plan.  YES  NO Patient  Exclusion Criteria Correction Plan  []  [x]  Baseline QTc interval is greater than or equal to 440 msec. IF above YES box checked dofetilide contraindicated unless patient has ICD; then may proceed if QTc 500-550 msec or with known ventricular conduction abnormalities may proceed with QTc 550-600 msec. QTc =     []  [x]  Magnesium level is less than 1.8 mEq/l : Last magnesium:  Lab Results  Component Value Date   MG 2.0 10/25/2018         []  [x]  Potassium level is less than 4 mEq/l : Last potassium:  Lab Results  Component Value Date   K 4.6 10/25/2018        replaced today x2   now at goal  []  [x]  Patient is known or suspected to have a digoxin level greater than 2 ng/ml: No results found for: DIGOXIN    []  [x]  Creatinine clearance less than 20 ml/min (calculated using Cockcroft-Gault, actual body weight and serum creatinine): Estimated Creatinine Clearance: 74 mL/min (by C-G formula based on SCr of 0.9 mg/dL).    []  [x]  Patient has received drugs known to prolong the QT intervals within the last 48 hours (phenothiazines, tricyclics or tetracyclic antidepressants, erythromycin, H-1 antihistamines, cisapride, fluoroquinolones, azithromycin). Drugs not listed above may have an, as yet, undetected potential to prolong the QT interval, updated information on QT prolonging agents is available at this website:QT prolonging agents   []  [x]  Patient received a dose of hydrochlorothiazide (Oretic) alone or in any combination including triamterene (Dyazide, Maxzide) in the last 48 hours.   []  [x]  Patient received a  medication known to increase dofetilide plasma concentrations prior to initial dofetilide dose:  . Trimethoprim (Primsol, Proloprim) in the last 36 hours . Verapamil (Calan, Verelan) in the last 36 hours or a sustained release dose in the last 72 hours . Megestrol (Megace) in the last 5 days  . Cimetidine (Tagamet) in the last 6 hours . Ketoconazole (Nizoral) in the last 24 hours . Itraconazole (Sporanox) in the last 48 hours  . Prochlorperazine (Compazine) in the last 36 hours    []  [x]  Patient is known to have a history of torsades de pointes; congenital or acquired long QT syndromes.   []  [x]  Patient has received a Class 1 antiarrhythmic with less than 2 half-lives since last dose. (Disopyramide, Quinidine, Procainamide, Lidocaine, Mexiletine, Flecainide, Propafenone)   []  [x]  Patient has received amiodarone therapy in the past 3 months or amiodarone level is greater than 0.3 ng/ml.    Patient has been appropriately anticoagulated with rivaroxaban 20mg  daily.  Ordering provider was confirmed at [email or web address] if they are not listed on the [facility] Prescribers list.  Goal of Therapy: Follow renal function, electrolytes, potential drug interactions, and dose adjustment. Provide education and 1 week supply at discharge.  Plan:  [x]   Physician selected initial dose within range recommended for patients level of renal function - will monitor for response.  []   Physician selected initial dose outside of range recommended for patients level of renal function - will discuss if the dose should be altered at this time.   Select One Calculated CrCl  Dose q12h  [x]  > 60 ml/min 500 mcg  []  40-60 ml/min 250 mcg  []  20-40 ml/min 125 mcg   2. Follow up QTc after the first 5 doses, renal function, electrolytes (K & Mg) daily x 3     days, dose adjustment, success of initiation and facilitate 1 week discharge supply as     clinically indicated.  3. Initiate Tikosyn education video  (Call [phone] and ask for Tikosyn Video # 116).   Bonnita Nasuti Pharm.D. CPP, BCPS Clinical Pharmacist [phone] 10/25/2018 10:17 PM

## 2018-10-25 NOTE — Care Management (Signed)
#    6. S/W PRINCESS @  [facility] # [id number]   1. TIKOSYN 125 MCG   250 MCG   500 MCG BID [place] # [phone]   2 .DOFETILIDE   125 MCG BID COVER- YES CO-PAY- $ 95.00 TIER- 4 DRUG PRIOR APPROVAL- NO  3. DOFETILIDE 250 MCG BID COVER- YES CO-PAY- $ 95.00 TIER- 4 DRUG PRIOR APPROVAL- NO  4. DOFETILIDE  500 MCG COVER- YES CO-PAY- $ 95.00 TIER- 4 DRUG PRIOR APPROVAL- NO  NO DEDUCTIBLE  PREFERRED  PHARMACY : YES  [facility] M[facility]O 90 DAY SUPPLY FOR M/O  $ 275.00

## 2018-10-25 NOTE — H&P (Signed)
Primary Care Physician: Dione Housekeeper, MD Referring Physician: Dr. Radford Pax Cardiologist: Dr. Shella Spearing is a 72 y.o. female with a h/o afib, HTN, hyperlipidemia and TIA. [name] Collier had a cardioversion in January of 2018 and it held until recently when [name] Collier was found to be back in afib. This was associated with increase in LLE and shortness of breath/fatigue. [name] Collier was scheduled for cardioversion yesterday which was unsuccessful. [name] Collier is now in [name] afib clinic for hospitalization for tikosyn. [name] Collier  had a sleep study and was not found to have any sleep apnea. No alcohol use. No tobacco or excessive caffeine.. [name] Collier is obese and has chronic  issues with LLE, improved with lasix.  [name] Collier has recently stopped lexapro   [name] Collier is aware of price of dofetilide and can afford.No missed doses of anticoagautltion. No benadryl use. [name] Collier is rate controlled without rate control on  board.  Today, [name] Collier denies symptoms of palpitations, chest pain, shortness of breath, orthopnea, PND, lower extremity edema, dizziness, presyncope, syncope, or neurologic sequela. [name] Collier is tolerating medications without difficulties and is otherwise without complaint today.   Past Medical History:  Diagnosis Date  . GERD (gastroesophageal reflux disease)   . HTN (hypertension)   . Hyperlipidemia   . TIA (transient ischemic attack)    Past Surgical History:  Procedure Laterality Date  . CARDIOVERSION N/A 01/04/2018   Procedure: CARDIOVERSION;  Surgeon: Dorothy Spark, MD;  Location: [facility] ENDOSCOPY;  Service: Cardiovascular;  Laterality: N/A;  . CARDIOVERSION N/A 09/20/2018   Procedure: CARDIOVERSION;  Surgeon: Sueanne Margarita, MD;  Location: [facility] ENDOSCOPY;  Service: Cardiovascular;  Laterality: N/A;  . KNEE SURGERY Bilateral    Torn miniscus  . RECTOCELE REPAIR    . TONSILLECTOMY    . VAGINAL HYSTERECTOMY      Current Facility-Administered Medications  Medication Dose Route Frequency Provider Last Rate Last Dose  .  0.9 %  sodium chloride infusion  250 mL Intravenous PRN Chanetta Marshall K, NP      . acetaminophen (TYLENOL) tablet 1,000 mg  1,000 mg Oral Daily PRN Patsey Berthold, NP      . Derrill Memo ON 10/26/2018] amLODipine (NORVASC) tablet 2.5 mg  2.5 mg Oral Daily Lynnell Jude, [employer] K, NP      . dofetilide (TIKOSYN) capsule 500 mcg  500 mcg Oral BID Patsey Berthold, NP      . Derrill Memo ON 10/26/2018] ezetimibe (ZETIA) tablet 10 mg  10 mg Oral Daily Chanetta Marshall K, NP      . famotidine (PEPCID) tablet 20 mg  20 mg Oral BID Patsey Berthold, NP      . Derrill Memo ON 10/26/2018] furosemide (LASIX) tablet 20 mg  20 mg Oral Daily Chanetta Marshall K, NP      . Derrill Memo ON 10/26/2018] potassium chloride SA (K-DUR,KLOR-CON) CR tablet 20 mEq  20 mEq Oral Daily Seiler, [employer] K, NP      . rivaroxaban (XARELTO) tablet 20 mg  20 mg Oral Q supper Lynnell Jude, [employer] K, NP      . sodium chloride flush (NS) 0.9 % injection 3 mL  3 mL Intravenous Q12H Seiler, [employer] K, NP      . sodium chloride flush (NS) 0.9 % injection 3 mL  3 mL Intravenous PRN Patsey Berthold, NP        Allergies  Allergen Reactions  . Augmentin [Amoxicillin-Pot Clavulanate] Other (See Comments)    c-diff Has Collier had a PCN reaction causing immediate rash,  facial/tongue/throat swelling, SOB or lightheadedness with hypotension: No Has Collier had a PCN reaction causing severe rash involving mucus membranes or skin necrosis: No Has Collier had a PCN reaction that required hospitalization: No Has Collier had a PCN reaction occurring within [name] last 10 years: Yes If all of [name] above answers are "NO", then may proceed with Cephalosporin use.   . Codeine Palpitations and Rash  . Prednisone Other (See Comments)    Jittery, red in [name] face    Social History   Socioeconomic History  . Marital status: Widowed    Spouse name: Not on file  . Number of children: 2  . Years of education: 44  . Highest education level: Not on file  Occupational History  . Not on file  Social  Needs  . Financial resource strain: Not on file  . Food insecurity:    Worry: Not on file    Inability: Not on file  . Transportation needs:    Medical: Not on file    Non-medical: Not on file  Tobacco Use  . Smoking status: Never Smoker  . Smokeless tobacco: Never Used  Substance and Sexual Activity  . Alcohol use: No    Alcohol/week: 0.0 standard drinks  . Drug use: No  . Sexual activity: Not on file  Lifestyle  . Physical activity:    Days per week: Not on file    Minutes per session: Not on file  . Stress: Not on file  Relationships  . Social connections:    Talks on phone: Not on file    Gets together: Not on file    Attends religious service: Not on file    Active member of club or organization: Not on file    Attends meetings of clubs or organizations: Not on file    Relationship status: Not on file  . Intimate partner violence:    Fear of current or ex partner: Not on file    Emotionally abused: Not on file    Physically abused: Not on file    Forced sexual activity: Not on file  Other Topics Concern  . Not on file  Social History Narrative   Lives alone   Caffeine use: Soda/tea daily    Family History  Problem Relation Age of Onset  . Hypertension Mother   . Hyperlipidemia Mother   . Neurologic Disorder Mother 20       GB  . Stroke Son   . Stroke Maternal Uncle   . Stroke Grandchild   . Prostate cancer Brother     ROS- All systems are reviewed and negative except as per [name] HPI above  Physical Exam: Vitals:   10/25/18 1515 10/25/18 1524  BP:  (!) 151/82  Pulse:  76  Resp:  18  Temp:  (!) 97.5 F (36.4 C)  TempSrc:  Oral  SpO2:  96%  Weight: 121.9 kg   Height: 5\' 5"  (1.651 m)    Wt Readings from Last 3 Encounters:  10/25/18 121.9 kg  10/25/18 120.7 kg  09/21/18 124.3 kg    Labs: Lab Results  Component Value Date   NA 139 10/25/2018   K 3.7 10/25/2018   CL 105 10/25/2018   CO2 24 10/25/2018   GLUCOSE 106 (H) 10/25/2018   BUN 14  10/25/2018   CREATININE 0.83 10/25/2018   CALCIUM 9.2 10/25/2018   MG 2.0 10/25/2018   Lab Results  Component Value Date   INR 1.10 02/26/2016   Lab Results  Component Value Date   CHOL 202 (H) 02/27/2016   HDL 44 02/27/2016   LDLCALC 139 (H) 02/27/2016   TRIG 97 02/27/2016     GEN- [name] Collier is well appearing, alert and oriented x 3 today.   Head- normocephalic, atraumatic Eyes-  Sclera clear, conjunctiva pink Ears- hearing intact Oropharynx- clear Neck- supple, no JVP Lymph- no cervical lymphadenopathy Lungs- Clear to ausculation bilaterally, normal work of breathing Heart- irregular rate and rhythm, no murmurs, rubs or gallops, PMI not laterally displaced GI- soft, NT, ND, + BS Extremities- no clubbing, cyanosis, + 1  LLE MS- no significant deformity or atrophy Skin- no rash or lesion Psych- euthymic mood, full affect Neuro- strength and sensation are intact  EKG-afib at 76 bpm, qrs int 80 ms, qtc 396 ms Echo-Study Conclusions  - Left ventricle: [name] cavity size was normal. There was mild   concentric hypertrophy. Systolic function was normal. [name]   estimated ejection fraction was in [name] range of 60% to 65%. Wall   motion was normal; there were no regional wall motion   abnormalities. - Aortic valve: Transvalvular velocity was within [name] normal range.   There was no stenosis. There was no regurgitation. - Aorta: Ascending aortic diameter: 38 mm (S). - Ascending aorta: [name] ascending aorta was mildly dilated. - Mitral valve: Mildly calcified annulus. Transvalvular velocity   was within [name] normal range. There was no evidence for stenosis.   There was mild regurgitation. - Left atrium: [name] atrium was mildly dilated.LAD 57 mm, volume 78 ml - Right ventricle: [name] cavity size was normal. Wall thickness was   normal. Systolic function was normal. - Atrial septum: No defect or patent foramen ovale was identified. - Tricuspid valve: There was mild-moderate  regurgitation. - Pulmonic valve: There was moderate regurgitation. - Pulmonary arteries: Systolic pressure was severely increased. PA   peak pressure: 69 mm Hg (S).    Assessment and Plan: 1. Persistent afib Failed cardioversion Options discussed to pursue SR With pt's issues with fluid retention and cost,  multaq not best option Flecainide not an option duet to significant first degree AV block in SR at 376 ms Pt not in favor of SE profile of amiodarone Sotalol not a good option as [name] Collier is slow in SR   Tikosyn discussed and [name] Collier would like to purse tikosyn [name] Collier is aware of cost of drug   No missed doses of anticoagulation No benadryl use Meds  screened by PharmD, lexapro is contraindicated and pt is off drug  Current qtc  396 ms I am concerned with size of left atrium/obesity, if we will be able to maintain SR in [name] long run Bmet/mag today with K+ at 3.7, [name] Collier has been called in 40 meq K+ x one dose to be taken today prior to hospital admit. Mag level good at 2.  Geroge Baseman Carroll, [facility] [address] [place], [place] [address] [phone]   I have seen, examined [name] Collier, and reviewed [name] above assessment and plan.  Changes to above are made where necessary.  On exam, iRRR.  Pt with ongoing difficulty with afib.  Will plan admission for initiation of tikosyn at this time.  QT stable for initiation.  Co Sign: Thompson Grayer, MD 10/25/2018 5:03 PM

## 2018-10-25 NOTE — Progress Notes (Signed)
Primary Care Physician: [name] Housekeeper, MD Referring Physician: Dr. Radford Pax Cardiologist: Dr. Shella Spearing is a 72 y.o. female with a h/o afib, HTN, hyperlipidemia and TIA. SHe had a cardioversion in January of 2018 and it held until recently when she was found to be back in afib. This was associated with increase in LLE and shortness of breath/fatigue. She was scheduled for cardioversion yesterday which was unsuccessful. She is now in the afib clinic for hospitalization for tikosyn. She  had a sleep study and was not found to have any sleep apnea. No alcohol use. No tobacco or excessive caffeine.. She is obese and has chronic  issues with LLE, improved with lasix.  She has recently stopped lexapro   She is aware of price of dofetilide and can afford.No missed doses of anticoagautltion. No benadryl use. She is rate controlled without rate control on  board.  Today, she denies symptoms of palpitations, chest pain, shortness of breath, orthopnea, PND, lower extremity edema, dizziness, presyncope, syncope, or neurologic sequela. The patient is tolerating medications without difficulties and is otherwise without complaint today.   Past Medical History:  Diagnosis Date  . GERD (gastroesophageal reflux disease)   . HTN (hypertension)   . Hyperlipidemia   . TIA (transient ischemic attack)    Past Surgical History:  Procedure Laterality Date  . CARDIOVERSION N/A 01/04/2018   Procedure: CARDIOVERSION;  Surgeon: Dorothy Spark, MD;  Location: [facility] ENDOSCOPY;  Service: Cardiovascular;  Laterality: N/A;  . CARDIOVERSION N/A 09/20/2018   Procedure: CARDIOVERSION;  Surgeon: Sueanne Margarita, MD;  Location: [facility] ENDOSCOPY;  Service: Cardiovascular;  Laterality: N/A;  . KNEE SURGERY Bilateral    Torn miniscus  . RECTOCELE REPAIR    . TONSILLECTOMY    . VAGINAL HYSTERECTOMY      Current Outpatient Medications  Medication Sig Dispense Refill  . acetaminophen (TYLENOL) 500 MG tablet Take  1,000 mg by mouth daily as needed for moderate pain or headache.     Marland Kitchen amLODipine (NORVASC) 2.5 MG tablet Take 1 tablet (2.5 mg total) by mouth daily. 90 tablet 3  . Cholecalciferol (VITAMIN D3) 5000 units CAPS Take 5,000 Units by mouth at bedtime.    Marland Kitchen ezetimibe (ZETIA) 10 MG tablet Take 10 mg by mouth daily.     . famotidine (PEPCID) 20 MG tablet Take 20 mg by mouth 2 (two) times daily.     . furosemide (LASIX) 20 MG tablet Take 1 tablet (20 mg total) by mouth daily for 210 doses. (Patient taking differently: Take 20 mg by mouth every morning. ) 30 tablet 6  . Potassium Chloride ER 20 MEQ TBCR Take 20 mEq by mouth daily. (Patient taking differently: Take 20 mEq by mouth every evening. ) 90 tablet 3  . rivaroxaban (XARELTO) 20 MG TABS tablet Take 1 tablet (20 mg total) by mouth daily with supper. 30 tablet 11   No current facility-administered medications for this encounter.     Allergies  Allergen Reactions  . Augmentin [Amoxicillin-Pot Clavulanate] Other (See Comments)    c-diff Has patient had a PCN reaction causing immediate rash, facial/tongue/throat swelling, SOB or lightheadedness with hypotension: No Has patient had a PCN reaction causing severe rash involving mucus membranes or skin necrosis: No Has patient had a PCN reaction that required hospitalization: No Has patient had a PCN reaction occurring within the last 10 years: Yes If all of the above answers are "NO", then may proceed with Cephalosporin use.   Marland Kitchen  Codeine Palpitations and Rash  . Prednisone Other (See Comments)    Jittery, red in the face    Social History   Socioeconomic History  . Marital status: Widowed    Spouse name: Not on file  . Number of children: 2  . Years of education: 24  . Highest education level: Not on file  Occupational History  . Not on file  Social Needs  . Financial resource strain: Not on file  . Food insecurity:    Worry: Not on file    Inability: Not on file  . Transportation  needs:    Medical: Not on file    Non-medical: Not on file  Tobacco Use  . Smoking status: Never Smoker  . Smokeless tobacco: Never Used  Substance and Sexual Activity  . Alcohol use: No    Alcohol/week: 0.0 standard drinks  . Drug use: No  . Sexual activity: Not on file  Lifestyle  . Physical activity:    Days per week: Not on file    Minutes per session: Not on file  . Stress: Not on file  Relationships  . Social connections:    Talks on phone: Not on file    Gets together: Not on file    Attends religious service: Not on file    Active member of club or organization: Not on file    Attends meetings of clubs or organizations: Not on file    Relationship status: Not on file  . Intimate partner violence:    Fear of current or ex partner: Not on file    Emotionally abused: Not on file    Physically abused: Not on file    Forced sexual activity: Not on file  Other Topics Concern  . Not on file  Social History Narrative   Lives alone   Caffeine use: Soda/tea daily    Family History  Problem Relation Age of Onset  . Hypertension Mother   . Hyperlipidemia Mother   . Neurologic Disorder Mother 71       GB  . Stroke Son   . Stroke Maternal Uncle   . Stroke Grandchild   . Prostate cancer Brother     ROS- All systems are reviewed and negative except as per the HPI above  Physical Exam: Vitals:   10/25/18 1144  BP: 126/82  Pulse: 76  Weight: 120.7 kg  Height: 5\' 5"  (1.651 m)   Wt Readings from Last 3 Encounters:  10/25/18 120.7 kg  09/21/18 124.3 kg  09/13/18 126.2 kg    Labs: Lab Results  Component Value Date   NA 139 09/13/2018   K 4.2 09/13/2018   CL 100 09/13/2018   CO2 24 09/13/2018   GLUCOSE 99 09/13/2018   BUN 12 09/13/2018   CREATININE 0.71 09/13/2018   CALCIUM 9.1 09/13/2018   Lab Results  Component Value Date   INR 1.10 02/26/2016   Lab Results  Component Value Date   CHOL 202 (H) 02/27/2016   HDL 44 02/27/2016   LDLCALC 139 (H)  02/27/2016   TRIG 97 02/27/2016     GEN- The patient is well appearing, alert and oriented x 3 today.   Head- normocephalic, atraumatic Eyes-  Sclera clear, conjunctiva pink Ears- hearing intact Oropharynx- clear Neck- supple, no JVP Lymph- no cervical lymphadenopathy Lungs- Clear to ausculation bilaterally, normal work of breathing Heart- irregular rate and rhythm, no murmurs, rubs or gallops, PMI not laterally displaced GI- soft, NT, ND, + BS Extremities- no  clubbing, cyanosis, + 1  LLE MS- no significant deformity or atrophy Skin- no rash or lesion Psych- euthymic mood, full affect Neuro- strength and sensation are intact  EKG-afib at 76 bpm, qrs int 80 ms, qtc 396 ms Echo-Study Conclusions  - Left ventricle: The cavity size was normal. There was mild   concentric hypertrophy. Systolic function was normal. The   estimated ejection fraction was in the range of 60% to 65%. Wall   motion was normal; there were no regional wall motion   abnormalities. - Aortic valve: Transvalvular velocity was within the normal range.   There was no stenosis. There was no regurgitation. - Aorta: Ascending aortic diameter: 38 mm (S). - Ascending aorta: The ascending aorta was mildly dilated. - Mitral valve: Mildly calcified annulus. Transvalvular velocity   was within the normal range. There was no evidence for stenosis.   There was mild regurgitation. - Left atrium: The atrium was mildly dilated.LAD 57 mm, volume 78 ml - Right ventricle: The cavity size was normal. Wall thickness was   normal. Systolic function was normal. - Atrial septum: No defect or patent foramen ovale was identified. - Tricuspid valve: There was mild-moderate regurgitation. - Pulmonic valve: There was moderate regurgitation. - Pulmonary arteries: Systolic pressure was severely increased. PA   peak pressure: 69 mm Hg (S).    Assessment and Plan: 1. Persistent afib Failed cardioversion Options discussed to pursue  SR With pt's issues with fluid retention and cost,  multaq not best option Flecainide not an option duet to significant first degree AV block in SR at 376 ms Pt not in favor of SE profile of amiodarone Sotalol not a good option as she is slow in SR   Tikosyn discussed and she would like to purse tikosyn She is aware of cost of drug   No missed doses of anticoagulation No benadryl use Meds  screened by PharmD, lexapro is contraindicated and pt is off drug  Current qtc  396 ms I am concerned with size of left atrium/obesity, if we will be able to maintain SR in the long run Bmet/mag today with K+ at 3.7, she has been called in 40 meq K+ x one dose to be taken today prior to hospital admit. Mag level good at 2.  Geroge Baseman Xayla Puzio, [facility] [address] [place], [place] [address] [phone]

## 2018-10-26 DIAGNOSIS — I10 Essential (primary) hypertension: Secondary | ICD-10-CM

## 2018-10-26 LAB — BASIC METABOLIC PANEL
ANION GAP: 4 — AB (ref 5–15)
BUN: 14 mg/dL (ref 8–23)
CHLORIDE: 109 mmol/L (ref 98–111)
CO2: 26 mmol/L (ref 22–32)
Calcium: 9.2 mg/dL (ref 8.9–10.3)
Creatinine, Ser: 0.71 mg/dL (ref 0.44–1.00)
GFR calc Af Amer: >60 mL/min (ref >60)
GFR calc non Af Amer: >60 mL/min (ref >60)
Glucose, Bld: 106 mg/dL — ABNORMAL HIGH (ref 70–99)
POTASSIUM: 4.5 mmol/L (ref 3.5–5.1)
SODIUM: 139 mmol/L (ref 135–145)

## 2018-10-26 LAB — MAGNESIUM: Magnesium: 1.9 mg/dL (ref 1.7–2.4)

## 2018-10-26 MED ORDER — SODIUM CHLORIDE 0.9% FLUSH
3.0000 mL | Freq: Two times a day (BID) | INTRAVENOUS | Status: DC
  Administered 2018-10-26 – 2018-10-28 (×4): 3 mL via INTRAVENOUS

## 2018-10-26 MED ORDER — SODIUM CHLORIDE 0.9 % IV SOLN
250.0000 mL | INTRAVENOUS | Status: DC
  Administered 2018-10-26: 250 mL via INTRAVENOUS

## 2018-10-26 MED ORDER — MAGNESIUM SULFATE IN D5W 1-5 GM/100ML-% IV SOLN
1.0000 g | Freq: Once | INTRAVENOUS | Status: AC
  Administered 2018-10-26: 1 g via INTRAVENOUS
  Filled 2018-10-26: qty 100

## 2018-10-26 MED ORDER — SODIUM CHLORIDE 0.9% FLUSH: 3.0000 mL | INTRAVENOUS | Status: DC | PRN

## 2018-10-26 NOTE — Progress Notes (Signed)
Post dose EKG is reviewed AFlutter 67bpm, QT is difficult to assess, manually measured looks 440-440ms, QTc 465-473ms  Continue Tikosyn unchanged tonight  Tommye Standard, PA-C

## 2018-10-26 NOTE — Care Management Note (Addendum)
Case Management Note  Patient Details  Name: [name] Collier MRN: [id number] Date of Birth: 09-16-1946  Subjective/Objective:  Pt presented for Persistent Atrial Fib- Initiated on Tikosyn. Pt is aware of co pay. Pt will need a Rx for a 7 day supply Tik[facility]yn [facility]- medications will be delivered to the bedside before transition home.   Action/Plan: Pt will need additional Rx for 30 day supply [facility] [place]. No further needs from CM at this time.   Expected Discharge Date:                  Expected Discharge Plan:  Home/Self Care  In-House Referral:  NA  Discharge planning Services  CM Consult  Post Acute Care Choice:  NA Choice offered to:  NA  DME Arranged:  N/A DME Agency:  NA  HH Arranged:  NA HH Agency:  NA  Status of Service:  Completed, signed off  If discussed at [place] of Stay Meetings, dates discussed:    Additional Comments:  Bethena Roys, RN 10/26/2018, 11:01 AM

## 2018-10-26 NOTE — Progress Notes (Signed)
   Progress Note   Subjective   Doing well today, the patient denies CP or SOB.  No new concerns  Inpatient Medications    Scheduled Meds: . amLODipine  2.5 mg Oral Daily  . dofetilide  500 mcg Oral BID  . ezetimibe  10 mg Oral Daily  . famotidine  20 mg Oral BID  . furosemide  20 mg Oral Daily  . potassium chloride  20 mEq Oral Daily  . rivaroxaban  20 mg Oral Q supper  . sodium chloride flush  3 mL Intravenous Q12H   Continuous Infusions: . sodium chloride     PRN Meds: sodium chloride, acetaminophen, sodium chloride flush   Vital Signs    Vitals:   10/25/18 1515 10/25/18 1524 10/25/18 1933 10/26/18 0434  BP:  (!) 151/82 (!) 164/73 (!) 173/90  Pulse:  76 86 81  Resp:  18    Temp:  (!) 97.5 F (36.4 C) 97.9 F (36.6 C)   TempSrc:  Oral Oral   SpO2:  96% 99% 95%  Weight: 121.9 kg     Height: 5\' 5"  (1.651 m)       Intake/Output Summary (Last 24 hours) at 10/26/2018 4503 Last data filed at 10/25/2018 1500 Gross per 24 hour  Intake 0 ml  Output -  Net 0 ml   Filed Weights   10/25/18 1515  Weight: 121.9 kg    Telemetry    Afib, rate controlled - Personally Reviewed  Physical Exam   GEN- The patient is well appearing, alert and oriented x 3 today.   Head- normocephalic, atraumatic Ears- hearing intact Oropharynx- clear Neck- supple, Lungs-   normal work of breathing Heart- irregular rate and rhythm  Extremities- no clubbing, cyanosis, or edema  MS- no significant deformity or atrophy Skin- no rash or lesion Psych- euthymic mood, full affect Neuro- strength and sensation are intact   Labs    Chemistry Recent Labs  Lab 10/25/18 1639 10/25/18 2047 10/26/18 0255  NA 140 138 139  K 3.4* 4.6 4.5  CL 106 104 109  CO2 26 26 26   GLUCOSE 121* 94 106*  BUN 14 16 14   CREATININE 0.93 0.90 0.71  CALCIUM 9.3 9.5 9.2  GFRNONAA 60* >60 >60  GFRAA >60 >60 >60  ANIONGAP 8 8 4*     HematologyNo results for input(s): WBC, RBC, HGB, HCT, MCV, MCH,  MCHC, RDW, PLT in the last 168 hours.  Cardiac EnzymesNo results for input(s): TROPONINI in the last 168 hours. No results for input(s): TROPIPOC in the last 168 hours.      Assessment & Plan    1.  Persistent afib Continue tikosyn load Qt is stable Anticipate cardioversion tomorrow if she does not convert to sinus in the interim. On xarelto for chads2vasc score of at least 5  2. Morbid obesity Body mass index is 44.73 kg/m. Lifestyle modification is encouraged  3. HTN Elevated BP Increase amlodipine to 5mg  daily  Thompson Grayer MD, [facility] 10/26/2018 8:03 AM

## 2018-10-26 NOTE — Discharge Instructions (Addendum)
You have an appointment set up with the [facility].  Multiple studies have shown that being followed by a dedicated atrial fibrillation clinic in addition to the standard care you receive from your other physicians improves health. We believe that enrollment in the atrial fibrillation clinic will allow [place] to better care for you.   The phone number to the [facility] is [phone]. The clinic is staffed Monday through Friday from 8:30am to 5pm.  Parking Directions: The clinic is located in the Heart and Vascular Building connected to [facility]. 1)From [address] turn on to [address] and go to the 3rd entrance  (Heart and Vascular entrance) on the right. 2)Look to the right for Heart &Vascular Parking Garage. 3)A code for the entrance is required please call the clinic to receive this.   4)Take the elevators to the 1st floor. Registration is in the room with the glass walls at the end of the hallway.  If you have any trouble parking or locating the clinic, please dont hesitate to call [phone].  Information on my medicine - XARELTO (Rivaroxaban)  Why was Xarelto prescribed for you? Xarelto was prescribed for you to reduce the risk of a blood clot forming that can cause a stroke if you have a medical condition called atrial fibrillation (a type of irregular heartbeat).  What do you need to know about xarelto ? Take your Xarelto ONCE DAILY at the same time every day with your evening meal. If you have difficulty swallowing the tablet whole, you may crush it and mix in applesauce just prior to taking your dose.  Take Xarelto exactly as prescribed by your doctor and DO NOT stop taking Xarelto without talking to the doctor who prescribed the medication.  Stopping without other stroke prevention medication to take the place of Xarelto may increase your risk of developing a clot that causes a stroke.  Refill your prescription before you  run out.  After discharge, you should have regular check-up appointments with your healthcare provider that is prescribing your Xarelto.  In the future your dose may need to be changed if your kidney function or weight changes by a significant amount.  What do you do if you miss a dose? If you are taking Xarelto ONCE DAILY and you miss a dose, take it as soon as you remember on the same day then continue your regularly scheduled once daily regimen the next day. Do not take two doses of Xarelto at the same time or on the same day.   Important Safety Information A possible side effect of Xarelto is bleeding. You should call your healthcare provider right away if you experience any of the following: ? Bleeding from an injury or your nose that does not stop. ? Unusual colored urine (red or dark brown) or unusual colored stools (red or black). ? Unusual bruising for unknown reasons. ? A serious fall or if you hit your head (even if there is no bleeding).  Some medicines may interact with Xarelto and might increase your risk of bleeding while on Xarelto. To help avoid this, consult your healthcare provider or pharmacist prior to using any new prescription or non-prescription medications, including herbals, vitamins, non-steroidal anti-inflammatory drugs (NSAIDs) and supplements.  This website has more information on Xarelto: [email or web address]

## 2018-10-27 ENCOUNTER — Inpatient Hospital Stay (HOSPITAL_COMMUNITY): Payer: Medicare Other | Admitting: Certified Registered Nurse Anesthetist

## 2018-10-27 ENCOUNTER — Encounter (HOSPITAL_COMMUNITY)
Admission: AD | Disposition: A | Discharge status: Home / Self Care | Payer: Self-pay | Source: Home / Self Care | Attending: Internal Medicine

## 2018-10-27 ENCOUNTER — Encounter (HOSPITAL_COMMUNITY): Payer: Self-pay

## 2018-10-27 HISTORY — PX: CARDIOVERSION: SHX1299

## 2018-10-27 LAB — BASIC METABOLIC PANEL
Anion gap: 6 (ref 5–15)
BUN: 12 mg/dL (ref 8–23)
CALCIUM: 9.2 mg/dL (ref 8.9–10.3)
CO2: 28 mmol/L (ref 22–32)
CREATININE: 0.75 mg/dL (ref 0.44–1.00)
Chloride: 106 mmol/L (ref 98–111)
GFR calc non Af Amer: >60 mL/min (ref >60)
Glucose, Bld: 101 mg/dL — ABNORMAL HIGH (ref 70–99)
Potassium: 4 mmol/L (ref 3.5–5.1)
SODIUM: 140 mmol/L (ref 135–145)

## 2018-10-27 LAB — MAGNESIUM: Magnesium: 2.1 mg/dL (ref 1.7–2.4)

## 2018-10-27 SURGERY — CARDIOVERSION
Anesthesia: General

## 2018-10-27 MED ORDER — PROPOFOL 10 MG/ML IV BOLUS
INTRAVENOUS | Status: DC | PRN
  Administered 2018-10-27: 40 mg via INTRAVENOUS
  Administered 2018-10-27: 10 mg via INTRAVENOUS
  Administered 2018-10-27: 20 mg via INTRAVENOUS

## 2018-10-27 MED ORDER — AMLODIPINE BESYLATE 5 MG PO TABS
5.0000 mg | ORAL_TABLET | Freq: Every day | ORAL | Status: DC
  Administered 2018-10-27 – 2018-10-28 (×2): 5 mg via ORAL
  Filled 2018-10-27 (×2): qty 1

## 2018-10-27 NOTE — Interval H&P Note (Signed)
History and Physical Interval Note:  10/27/2018 11:33 AM  [name] Collier  has presented today for surgery, with the diagnosis of a fib, tikosyn load  The various methods of treatment have been discussed with the patient and family. After consideration of risks, benefits and other options for treatment, the patient has consented to  Procedure(s): CARDIOVERSION (N/A) as a surgical intervention .  The patient's history has been reviewed, patient examined, no change in status, stable for surgery.  I have reviewed the patient's chart and labs.  Questions were answered to the patient's satisfaction.     Skiler Olden Harrell Gave

## 2018-10-27 NOTE — Progress Notes (Addendum)
Progress Note   Subjective   Doing well today. She remains in rate controlled flutter. She denies CP or SOB.  No new concerns  Inpatient Medications    Scheduled Meds: . amLODipine  2.5 mg Oral Daily  . dofetilide  500 mcg Oral BID  . ezetimibe  10 mg Oral Daily  . famotidine  20 mg Oral BID  . furosemide  20 mg Oral Daily  . potassium chloride  20 mEq Oral Daily  . rivaroxaban  20 mg Oral Q supper  . sodium chloride flush  3 mL Intravenous Q12H  . sodium chloride flush  3 mL Intravenous Q12H   Continuous Infusions: . sodium chloride    . sodium chloride 250 mL (10/26/18 1112)   PRN Meds: sodium chloride, acetaminophen, sodium chloride flush, sodium chloride flush   Vital Signs    Vitals:   10/26/18 1557 10/26/18 2124 10/27/18 0507 10/27/18 0508  BP: (!) 158/93 (!) 158/98  (!) 157/93  Pulse: 72 (!) 59  68  Resp: 20     Temp: 98 F (36.7 C) (!) 97.4 F (36.3 C)  97.7 F (36.5 C)  TempSrc: Oral Oral  Oral  SpO2: 97% 95%  98%  Weight:   119.9 kg   Height:        Intake/Output Summary (Last 24 hours) at 10/27/2018 0757 Last data filed at 10/26/2018 1700 Gross per 24 hour  Intake 222 ml  Output -  Net 222 ml   Filed Weights   10/25/18 1515 10/27/18 0507  Weight: 121.9 kg 119.9 kg    Telemetry    Aflutter, rate controlled - Personally Reviewed  Physical Exam   GEN- The patient is well appearing, alert and oriented x 3 today.   Head- normocephalic, atraumatic Ears- hearing intact Oropharynx- clear Neck- supple, Lungs-   normal work of breathing Heart- irregular rate and rhythm  Extremities- no clubbing, cyanosis, or edema  MS- no significant deformity or atrophy Skin- no rash or lesion Psych- euthymic mood, full affect Neuro- strength and sensation are intact   Labs    Chemistry Recent Labs  Lab 10/25/18 2047 10/26/18 0255 10/27/18 0414  NA 138 139 140  K 4.6 4.5 4.0  CL 104 109 106  CO2 26 26 28   GLUCOSE 94 106* 101*  BUN 16 14 12    CREATININE 0.90 0.71 0.75  CALCIUM 9.5 9.2 9.2  GFRNONAA >60 >60 >60  GFRAA >60 >60 >60  ANIONGAP 8 4* 6     HematologyNo results for input(s): WBC, RBC, HGB, HCT, MCV, MCH, MCHC, RDW, PLT in the last 168 hours.  Cardiac EnzymesNo results for input(s): TROPONINI in the last 168 hours. No results for input(s): TROPIPOC in the last 168 hours.      Assessment & Plan    1.  Persistent afib Continue tikosyn load Qt appears is stable Renal function stable, K+ and Magnesium WNL. Anticipate cardioversion today if she does not convert to sinus in the interim. On xarelto for chads2vasc score of at least 5  2. Morbid obesity Body mass index is 43.98 kg/m. Lifestyle modification is encouraged  3. HTN BP remains elevated. Will increase amlodipine to 5mg  daily  Clint Fenton PA-C 10/27/2018 7:57 AM   I have seen, examined the patient, and reviewed the above assessment and plan.  Changes to above are made where necessary.  On exam, iRRR.  Will plan cardioversion today.  Continue tikosyn.  Agree with increase amlodipine for elevated BP.  Lifestyle  modification is important long term.  Co Sign: Thompson Grayer, MD 10/27/2018

## 2018-10-27 NOTE — Transfer of Care (Signed)
Immediate Anesthesia Transfer of Care Note  Patient: [name] Collier  Procedure(s) Performed: CARDIOVERSION (N/A )  Patient Location: Endoscopy Unit  Anesthesia Type:General  Level of Consciousness: awake, alert , oriented and patient cooperative  Airway & Oxygen Therapy: Patient Spontanous Breathing and Patient connected to nasal cannula oxygen  Post-op Assessment: Report given to RN and Post -op Vital signs reviewed and stable  Post vital signs: Reviewed and stable  Last Vitals:  Vitals Value Taken Time  BP    Temp    Pulse    Resp    SpO2      Last Pain:  Vitals:   10/27/18 1058  TempSrc: Oral  PainSc: 0-No pain      Patients Stated Pain Goal: 0 (45/40/98 1191)  Complications: No apparent anesthesia complications

## 2018-10-27 NOTE — Anesthesia Postprocedure Evaluation (Signed)
Anesthesia Post Note  Patient: [name] Collier  Procedure(s) Performed: CARDIOVERSION (N/A )     Patient location during evaluation: Endoscopy Anesthesia Type: General Level of consciousness: awake and alert Pain management: pain level controlled Vital Signs Assessment: post-procedure vital signs reviewed and stable Respiratory status: spontaneous breathing, nonlabored ventilation, respiratory function stable and patient connected to nasal cannula oxygen Cardiovascular status: blood pressure returned to baseline and stable Postop Assessment: no apparent nausea or vomiting Anesthetic complications: no    Last Vitals:  Vitals:   10/27/18 1255 10/27/18 1447  BP: (!) 157/87 (!) 137/95  Pulse: 67 72  Resp: 20   Temp:  36.6 C  SpO2: 97% 97%    Last Pain:  Vitals:   10/27/18 1447  TempSrc: Oral  PainSc:                  [name]

## 2018-10-27 NOTE — CV Procedure (Signed)
Procedure:   DCCV  Indication:  Symptomatic atrial fibrillation  Procedure Note:  The patient signed informed consent.  She has had had therapeutic anticoagulation with rivaroxaban greater than 3 weeks.  Anesthesia was administered by Dr. Ermalene Postin.  Adequate airway was maintained throughout and vital followed per protocol.  She was cardioverted x 1 with 150J of biphasic synchronized energy.  She converted to NSR with 1st degree AV block.  There were no apparent complications.  The patient had normal neuro status and respiratory status post procedure with vitals stable as recorded elsewhere.    Follow up:  She will continue on current medical therapy.    Buford Dresser, MD PhD 10/27/2018 12:16 PM

## 2018-10-27 NOTE — Anesthesia Preprocedure Evaluation (Signed)
Anesthesia Evaluation  Patient identified by MRN, date of birth, ID band Patient awake    Reviewed: Allergy & Precautions, NPO status , Patient's Chart, lab work & pertinent test results  History of Anesthesia Complications Negative for: history of anesthetic complications  Airway Mallampati: II  TM Distance: >3 FB Neck ROM: Full    Dental  (+) Teeth Intact   Pulmonary neg pulmonary ROS,    breath sounds clear to auscultation       Cardiovascular hypertension, Pt. on medications (-) angina(-) Past MI and (-) CHF + dysrhythmias Atrial Fibrillation  Rhythm:Irregular     Neuro/Psych Anxiety TIA   GI/Hepatic Neg liver ROS, GERD  Controlled,  Endo/Other  Morbid obesity  Renal/GU negative Renal ROS     Musculoskeletal   Abdominal   Peds  Hematology negative hematology ROS (+)   Anesthesia Other Findings   Reproductive/Obstetrics                             Anesthesia Physical Anesthesia Plan  ASA: III  Anesthesia Plan: General   Post-op Pain Management:    Induction: Intravenous  PONV Risk Score and Plan: 3 and Treatment may vary due to age or medical condition  Airway Management Planned: Mask  Additional Equipment: None  Intra-op Plan:   Post-operative Plan:   Informed Consent: I have reviewed the patients History and Physical, chart, labs and discussed the procedure including the risks, benefits and alternatives for the proposed anesthesia with the patient or authorized representative who has indicated his/her understanding and acceptance.   Dental advisory given  Plan Discussed with: CRNA and Surgeon  Anesthesia Plan Comments:         Anesthesia Quick Evaluation

## 2018-10-28 ENCOUNTER — Encounter (HOSPITAL_COMMUNITY): Payer: Self-pay | Admitting: Cardiology

## 2018-10-28 LAB — BASIC METABOLIC PANEL
Anion gap: 8 (ref 5–15)
BUN: 16 mg/dL (ref 8–23)
CALCIUM: 9.1 mg/dL (ref 8.9–10.3)
CO2: 25 mmol/L (ref 22–32)
Chloride: 105 mmol/L (ref 98–111)
Creatinine, Ser: 0.86 mg/dL (ref 0.44–1.00)
GFR calc Af Amer: >60 mL/min (ref >60)
GFR calc non Af Amer: >60 mL/min (ref >60)
GLUCOSE: 97 mg/dL (ref 70–99)
Potassium: 4.5 mmol/L (ref 3.5–5.1)
Sodium: 138 mmol/L (ref 135–145)

## 2018-10-28 LAB — MAGNESIUM: MAGNESIUM: 2 mg/dL (ref 1.7–2.4)

## 2018-10-28 MED ORDER — AMLODIPINE BESYLATE 5 MG PO TABS: 5.0000 mg | ORAL_TABLET | Freq: Every day | ORAL | 6 refills | Status: DC

## 2018-10-28 MED ORDER — DOFETILIDE 500 MCG PO CAPS: 500.0000 ug | ORAL_CAPSULE | Freq: Two times a day (BID) | ORAL | 6 refills | Status: DC

## 2018-10-28 MED ORDER — DOFETILIDE 500 MCG PO CAPS: 500.0000 ug | ORAL_CAPSULE | Freq: Two times a day (BID) | ORAL | 0 refills | Status: DC

## 2018-10-28 MED FILL — TIKOSYN 500 MCG CAPS: 500 | 7 days supply | Qty: 14 | Fill #0

## 2018-10-28 NOTE — Discharge Summary (Addendum)
ELECTROPHYSIOLOGY PROCEDURE DISCHARGE SUMMARY    Patient ID: [name] Collier,  MRN: [id number], DOB/AGE: 01-07-1946 72 y.o.  Admit date: 10/25/2018 Discharge date: 10/28/2018  Primary Care Physician: Dione Housekeeper, MD  Primary Cardiologist: Dr. Percival Spanish Electrophysiologist: Dr. Rayann Heman (new)  Primary Discharge Diagnosis:  1.  Persistent atrial fibrillation status post Tikosyn loading this admission      CHA2DS2Vasc is 5, on Xarelto, appropriately dosed  Secondary Discharge Diagnosis:  1. HTN 2. HLD 3. TIA (old)  Allergies  Allergen Reactions  . Augmentin [Amoxicillin-Pot Clavulanate] Other (See Comments)    c-diff Has patient had a PCN reaction causing immediate rash, facial/tongue/throat swelling, SOB or lightheadedness with hypotension: No Has patient had a PCN reaction causing severe rash involving mucus membranes or skin necrosis: No Has patient had a PCN reaction that required hospitalization: No Has patient had a PCN reaction occurring within the last 10 years: Yes If all of the above answers are "NO", then may proceed with Cephalosporin use.   . Codeine Palpitations and Rash  . Prednisone Other (See Comments)    Jittery, red in the face     Procedures This Admission:  1.  Tikosyn loading 2.  Direct current cardioversion on 10/27/18 by Dr Harrell Gave which successfully restored SR.  There were no early apparent complications.   Brief HPI: [name] Collier is a 72 y.o. female with a past medical history as noted above.  They were referred to the AFib clinic in the outpatient setting for treatment options of atrial fibrillation.  Risks, benefits, and alternatives to Tikosyn were reviewed with the patient who wished to proceed.    Hospital Course:  The patient was admitted and Tikosyn was initiated.  Renal function and electrolytes were followed during the hospitalization.  Her QTc remained stable.  BP was elevated and she has her Amlodipine increased.  The patient  believes a component of this is some anxiety about being in the hospital, she will have 1 week clinic visit to f/u.  On 10/27/18 the patient underwent direct current cardioversion which restored sinus rhythm.  She was monitored until discharge on telemetry which demonstrated SR, 1st degree AVblock.  On the day of discharge, she feels well, no CP or SOB, the patient was examined by Dr Rayann Heman who considered her stable for discharge to home.  Follow-up has been arranged with the AFib clinic in 1 week and with Dr Rayann Heman in 4 weeks.   Physical Exam: Vitals:   10/27/18 2133 10/27/18 2327 10/28/18 0543 10/28/18 0954  BP: (!) 176/87 (!) 157/74 (!) 176/87 (!) 162/56  Pulse: 70  66   Resp:      Temp: (!) 97.5 F (36.4 C)  98 F (36.7 C)   TempSrc: Oral  Oral   SpO2: 98%  97%   Weight:   119.5 kg   Height:        GEN- The patient is well appearing, alert and oriented x 3 today.   HEENT: normocephalic, atraumatic; sclera clear, conjunctiva pink; hearing intact; oropharynx clear; neck supple, no JVP Lymph- no cervical lymphadenopathy Lungs- CTA b/l, normal work of breathing.  No wheezes, rales, rhonchi Heart- RRR, no murmurs, rubs or gallops, PMI not laterally displaced GI- soft, non-tender, non-distended Extremities- no clubbing, cyanosis, or edema MS- no significant deformity or atrophy Skin- warm and dry, no rash or lesion Psych- euthymic mood, full affect Neuro- strength and sensation are intact   Labs:   Lab Results  Component Value Date  WBC 6.8 09/13/2018   HGB 11.9 09/13/2018   HCT 36.8 09/13/2018   MCV 88 09/13/2018   PLT 234 09/13/2018    Recent Labs  Lab 10/28/18 0357  NA 138  K 4.5  CL 105  CO2 25  BUN 16  CREATININE 0.86  CALCIUM 9.1  GLUCOSE 97     Discharge Medications:  Allergies as of 10/28/2018      Reactions   Augmentin [amoxicillin-pot Clavulanate] Other (See Comments)   c-diff Has patient had a PCN reaction causing immediate rash,  facial/tongue/throat swelling, SOB or lightheadedness with hypotension: No Has patient had a PCN reaction causing severe rash involving mucus membranes or skin necrosis: No Has patient had a PCN reaction that required hospitalization: No Has patient had a PCN reaction occurring within the last 10 years: Yes If all of the above answers are "NO", then may proceed with Cephalosporin use.   Codeine Palpitations, Rash   Prednisone Other (See Comments)   Jittery, red in the face      Medication List    STOP taking these medications   potassium chloride SA 20 MEQ tablet Commonly known as:  K-DUR,KLOR-CON     TAKE these medications   acetaminophen 500 MG tablet Commonly known as:  TYLENOL Take 1,000 mg by mouth daily as needed for moderate pain or headache.   amLODipine 5 MG tablet Commonly known as:  NORVASC Take 1 tablet (5 mg total) by mouth daily. Start taking on:  10/29/2018 What changed:    medication strength  how much to take   dofetilide 500 MCG capsule Commonly known as:  TIKOSYN Take 1 capsule (500 mcg total) by mouth 2 (two) times daily.   famotidine 20 MG tablet Commonly known as:  PEPCID Take 20 mg by mouth 2 (two) times daily.   furosemide 20 MG tablet Commonly known as:  LASIX Take 1 tablet (20 mg total) by mouth daily for 210 doses. What changed:  when to take this   Potassium Chloride ER 20 MEQ Tbcr Take 20 mEq by mouth daily. What changed:  when to take this   rivaroxaban 20 MG Tabs tablet Commonly known as:  XARELTO Take 1 tablet (20 mg total) by mouth daily with supper.   Vitamin D3 125 MCG (5000 UT) Caps Take 5,000 Units by mouth at bedtime.   ZETIA 10 MG tablet Generic drug:  ezetimibe Take 10 mg by mouth daily.       Disposition:  Home  Discharge Instructions    Diet - low sodium heart healthy   Complete by:  As directed    Increase activity slowly   Complete by:  As directed      Follow-up Information    [facility] up.   Specialty:  Cardiology Why:  11/07/18 @ 9:30AM Contact information: [address] [id number] [place] [address] [phone]       Thompson Grayer, MD Follow up.   Specialty:  Cardiology Why:  11/23/18 @ 10:30AM Contact information: [place] [address] [phone]           Duration of Discharge Encounter: Greater than 30 minutes including physician time.  Signed, Tommye Standard, PA-C 10/28/2018 1:59 PM  I have seen, examined the patient, and reviewed the above assessment and plan.  Changes to above are made where necessary.  On exam, RRR.  Remains in sinus rhythm.  QT is stable for discharge.  DC to  home with close outpatient follow-up in the AF clinic.  Co Sign: Thompson Grayer, MD 10/28/2018

## 2018-11-01 ENCOUNTER — Other Ambulatory Visit: Payer: Self-pay | Admitting: Cardiology

## 2018-11-07 ENCOUNTER — Encounter (HOSPITAL_COMMUNITY): Payer: Self-pay | Admitting: Nurse Practitioner

## 2018-11-07 ENCOUNTER — Other Ambulatory Visit: Payer: Self-pay | Admitting: Cardiology

## 2018-11-07 ENCOUNTER — Ambulatory Visit (HOSPITAL_COMMUNITY)
Admission: RE | Admit: 2018-11-07 | Discharge: 2018-11-07 | Disposition: A | Discharge status: Home / Self Care | Payer: Medicare Other | Source: Ambulatory Visit | Attending: Nurse Practitioner | Admitting: Nurse Practitioner

## 2018-11-07 VITALS — BP 142/86 | HR 66 | Ht 65.0 in | Wt 265.0 lb

## 2018-11-07 DIAGNOSIS — Z880 Allergy status to penicillin: Secondary | ICD-10-CM | POA: Insufficient documentation

## 2018-11-07 DIAGNOSIS — Z7901 Long term (current) use of anticoagulants: Secondary | ICD-10-CM | POA: Insufficient documentation

## 2018-11-07 DIAGNOSIS — Z881 Allergy status to other antibiotic agents status: Secondary | ICD-10-CM | POA: Diagnosis not present

## 2018-11-07 DIAGNOSIS — I10 Essential (primary) hypertension: Secondary | ICD-10-CM | POA: Insufficient documentation

## 2018-11-07 DIAGNOSIS — Z885 Allergy status to narcotic agent status: Secondary | ICD-10-CM | POA: Diagnosis not present

## 2018-11-07 DIAGNOSIS — Z823 Family history of stroke: Secondary | ICD-10-CM | POA: Diagnosis not present

## 2018-11-07 DIAGNOSIS — I4819 Other persistent atrial fibrillation: Secondary | ICD-10-CM

## 2018-11-07 DIAGNOSIS — Z8673 Personal history of transient ischemic attack (TIA), and cerebral infarction without residual deficits: Secondary | ICD-10-CM | POA: Insufficient documentation

## 2018-11-07 DIAGNOSIS — Z79899 Other long term (current) drug therapy: Secondary | ICD-10-CM | POA: Insufficient documentation

## 2018-11-07 DIAGNOSIS — E785 Hyperlipidemia, unspecified: Secondary | ICD-10-CM | POA: Diagnosis not present

## 2018-11-07 DIAGNOSIS — Z888 Allergy status to other drugs, medicaments and biological substances status: Secondary | ICD-10-CM | POA: Insufficient documentation

## 2018-11-07 DIAGNOSIS — Z8249 Family history of ischemic heart disease and other diseases of the circulatory system: Secondary | ICD-10-CM | POA: Diagnosis not present

## 2018-11-07 LAB — BASIC METABOLIC PANEL
Anion gap: 7 (ref 5–15)
BUN: 10 mg/dL (ref 8–23)
CALCIUM: 9.6 mg/dL (ref 8.9–10.3)
CHLORIDE: 101 mmol/L (ref 98–111)
CO2: 30 mmol/L (ref 22–32)
CREATININE: 0.72 mg/dL (ref 0.44–1.00)
GFR calc Af Amer: >60 mL/min (ref >60)
GFR calc non Af Amer: >60 mL/min (ref >60)
Glucose, Bld: 100 mg/dL — ABNORMAL HIGH (ref 70–99)
Potassium: 3.8 mmol/L (ref 3.5–5.1)
Sodium: 138 mmol/L (ref 135–145)

## 2018-11-07 LAB — MAGNESIUM: Magnesium: 2 mg/dL (ref 1.7–2.4)

## 2018-11-07 NOTE — Progress Notes (Signed)
Primary Care Physician: Dione Housekeeper, MD Referring Physician: Dr. Radford Pax Cardiologist: Dr. Percival Spanish EP: Dr. [name] Hearing is a 72 y.o. female with a h/o afib, HTN, hyperlipidemia and TIA. SHe had a cardioversion in January of 2018 and it held until recently when she was found to be back in afib. This was associated with increase in LLE and shortness of breath/fatigue. She was scheduled for cardioversion yesterday which was unsuccessful. She is now in the afib clinic for hospitalization for tikosyn. She  had a sleep study and was not found to have any sleep apnea. No alcohol use. No tobacco or excessive caffeine.. She is obese and has chronic  issues with LLE, improved with lasix.  She has recently stopped lexapro   She is aware of price of dofetilide and can afford. No missed doses of anticoagautltion. No benadryl use. She is rate controlled without rate control on  Board.  F/u in afib clinic, 12/2, one week after Tikosyn load. She had uneventful Tikosyn load and had successful cardioversion. QTc stayed stable and she was discharged on 500 mcg bid of Tikosyn.She has stayed in SR since d/c.She feels improved with more energy and less shortness of breath.   Today, she denies symptoms of palpitations, chest pain, shortness of breath, orthopnea, PND, lower extremity edema, dizziness, presyncope, syncope, or neurologic sequela. The patient is tolerating medications without difficulties and is otherwise without complaint today.   Past Medical History:  Diagnosis Date  . GERD (gastroesophageal reflux disease)   . HTN (hypertension)   . Hyperlipidemia   . TIA (transient ischemic attack)    Past Surgical History:  Procedure Laterality Date  . CARDIOVERSION N/A 01/04/2018   Procedure: CARDIOVERSION;  Surgeon: Dorothy Spark, MD;  Location: [facility] ENDOSCOPY;  Service: Cardiovascular;  Laterality: N/A;  . CARDIOVERSION N/A 09/20/2018   Procedure: CARDIOVERSION;  Surgeon: Sueanne Margarita, MD;  Location: [facility] ENDOSCOPY;  Service: Cardiovascular;  Laterality: N/A;  . CARDIOVERSION N/A 10/27/2018   Procedure: CARDIOVERSION;  Surgeon: Buford Dresser, MD;  Location: [facility] ENDOSCOPY;  Service: Cardiovascular;  Laterality: N/A;  . KNEE SURGERY Bilateral    Torn miniscus  . RECTOCELE REPAIR    . TONSILLECTOMY    . VAGINAL HYSTERECTOMY      Current Outpatient Medications  Medication Sig Dispense Refill  . acetaminophen (TYLENOL) 500 MG tablet Take 1,000 mg by mouth daily as needed for moderate pain or headache.     Marland Kitchen amLODipine (NORVASC) 5 MG tablet Take 1 tablet (5 mg total) by mouth daily. 30 tablet 6  . Cholecalciferol (VITAMIN D3) 5000 units CAPS Take 5,000 Units by mouth at bedtime.    . dofetilide (TIKOSYN) 500 MCG capsule Take 1 capsule (500 mcg total) by mouth 2 (two) times daily. 60 capsule 6  . ezetimibe (ZETIA) 10 MG tablet Take 10 mg by mouth daily.     . famotidine (PEPCID) 20 MG tablet Take 20 mg by mouth 2 (two) times daily.     . furosemide (LASIX) 20 MG tablet Take 1 tablet (20 mg total) by mouth daily for 210 doses. (Patient taking differently: Take 20 mg by mouth every morning. ) 30 tablet 6  . Potassium Chloride ER 20 MEQ TBCR Take 20 mEq by mouth daily. (Patient taking differently: Take 20 mEq by mouth every evening. ) 90 tablet 3  . XARELTO 20 MG TABS tablet TAKE 1 TABLET (20 MG TOTAL) BY MOUTH DAILY WITH SUPPER. 90 tablet 01   No  current facility-administered medications for this encounter.     Allergies  Allergen Reactions  . Augmentin [Amoxicillin-Pot Clavulanate] Other (See Comments)    c-diff Has patient had a PCN reaction causing immediate rash, facial/tongue/throat swelling, SOB or lightheadedness with hypotension: No Has patient had a PCN reaction causing severe rash involving mucus membranes or skin necrosis: No Has patient had a PCN reaction that required hospitalization: No Has patient had a PCN reaction occurring within the last 10 years:  Yes If all of the above answers are "NO", then may proceed with Cephalosporin use.   . Codeine Palpitations and Rash  . Prednisone Other (See Comments)    Jittery, red in the face    Social History   Socioeconomic History  . Marital status: Widowed    Spouse name: Not on file  . Number of children: 2  . Years of education: 78  . Highest education level: Not on file  Occupational History  . Not on file  Social Needs  . Financial resource strain: Not on file  . Food insecurity:    Worry: Not on file    Inability: Not on file  . Transportation needs:    Medical: Not on file    Non-medical: Not on file  Tobacco Use  . Smoking status: Never Smoker  . Smokeless tobacco: Never Used  Substance and Sexual Activity  . Alcohol use: No    Alcohol/week: 0.0 standard drinks  . Drug use: No  . Sexual activity: Not on file  Lifestyle  . Physical activity:    Days per week: Not on file    Minutes per session: Not on file  . Stress: Not on file  Relationships  . Social connections:    Talks on phone: Not on file    Gets together: Not on file    Attends religious service: Not on file    Active member of club or organization: Not on file    Attends meetings of clubs or organizations: Not on file    Relationship status: Not on file  . Intimate partner violence:    Fear of current or ex partner: Not on file    Emotionally abused: Not on file    Physically abused: Not on file    Forced sexual activity: Not on file  Other Topics Concern  . Not on file  Social History Narrative   Lives alone   Caffeine use: Soda/tea daily    Family History  Problem Relation Age of Onset  . Hypertension Mother   . Hyperlipidemia Mother   . Neurologic Disorder Mother 36       GB  . Stroke Son   . Stroke Maternal Uncle   . Stroke Grandchild   . Prostate cancer Brother     ROS- All systems are reviewed and negative except as per the HPI above  Physical Exam: Vitals:   11/07/18 0929  BP:  (!) 142/86  Pulse: 66  Weight: 120.2 kg  Height: 5\' 5"  (1.651 m)   Wt Readings from Last 3 Encounters:  11/07/18 120.2 kg  10/28/18 119.5 kg  10/25/18 120.7 kg    Labs: Lab Results  Component Value Date   NA 138 10/28/2018   K 4.5 10/28/2018   CL 105 10/28/2018   CO2 25 10/28/2018   GLUCOSE 97 10/28/2018   BUN 16 10/28/2018   CREATININE 0.86 10/28/2018   CALCIUM 9.1 10/28/2018   MG 2.0 10/28/2018   Lab Results  Component Value Date  INR 1.10 02/26/2016   Lab Results  Component Value Date   CHOL 202 (H) 02/27/2016   HDL 44 02/27/2016   LDLCALC 139 (H) 02/27/2016   TRIG 97 02/27/2016     GEN- The patient is well appearing, alert and oriented x 3 today.   Head- normocephalic, atraumatic Eyes-  Sclera clear, conjunctiva pink Ears- hearing intact Oropharynx- clear Neck- supple, no JVP Lymph- no cervical lymphadenopathy Lungs- Clear to ausculation bilaterally, normal work of breathing Heart- irregular rate and rhythm, no murmurs, rubs or gallops, PMI not laterally displaced GI- soft, NT, ND, + BS Extremities- no clubbing, cyanosis, trace LLE MS- no significant deformity or atrophy Skin- no rash or lesion Psych- euthymic mood, full affect Neuro- strength and sensation are intact  EKG- SR with first degree AV block, PR int 212 ms, qrs int 76 ms, qtc 440 ms Echo-Study Conclusions  - Left ventricle: The cavity size was normal. There was mild   concentric hypertrophy. Systolic function was normal. The   estimated ejection fraction was in the range of 60% to 65%. Wall   motion was normal; there were no regional wall motion   abnormalities. - Aortic valve: Transvalvular velocity was within the normal range.   There was no stenosis. There was no regurgitation. - Aorta: Ascending aortic diameter: 38 mm (S). - Ascending aorta: The ascending aorta was mildly dilated. - Mitral valve: Mildly calcified annulus. Transvalvular velocity   was within the normal range.  There was no evidence for stenosis.   There was mild regurgitation. - Left atrium: The atrium was mildly dilated.LAD 57 mm, volume 78 ml - Right ventricle: The cavity size was normal. Wall thickness was   normal. Systolic function was normal. - Atrial septum: No defect or patent foramen ovale was identified. - Tricuspid valve: There was mild-moderate regurgitation. - Pulmonic valve: There was moderate regurgitation. - Pulmonary arteries: Systolic pressure was severely increased. PA   peak pressure: 69 mm Hg (S).    Assessment and Plan: 1. Persistent afib Maintaining  SR on Tikosyn s/p successful  cardioversion Tikosyn precautions discussed again    No missed doses of anticoagulation No benadryl use Current qtc  396 ms Bmet/mag today   2. HTN Stable  F/u with Dr. Rayann Heman 12/18 afib clinic as needed  Butch Penny C. Promiss [place], [facility] [address] [place], [place] [address] [phone]

## 2018-11-18 ENCOUNTER — Telehealth: Payer: Self-pay | Admitting: *Deleted

## 2018-11-18 NOTE — Telephone Encounter (Signed)
   [facility] Pre-operative Risk Assessment    Request for surgical clearance:  1. What type of surgery is being performed? ANAL FISSURE PROCEDURE REPAIR   2. When is this surgery scheduled? TBD   3. Are there any medications that need to be held prior to surgery and how long? XARELTO   4. Practice name and name of physician performing surgery? [facility]; DR. Harrell Gave WHITE   5. What is your office phone and fax number? PH# [phone]; FAX# [phone]   2. Anesthesia type (None, local, MAC, general) ? GENERAL    [name] Collier 11/18/2018, 12:16 PM  _________________________________________________________________   (provider comments below)

## 2018-11-21 ENCOUNTER — Other Ambulatory Visit (HOSPITAL_COMMUNITY): Payer: Medicare Other | Admitting: Nurse Practitioner

## 2018-11-23 ENCOUNTER — Ambulatory Visit: Payer: Medicare Other | Admitting: Internal Medicine

## 2018-11-23 ENCOUNTER — Encounter: Payer: Self-pay | Admitting: Internal Medicine

## 2018-11-23 VITALS — BP 132/86 | HR 68 | Ht 65.0 in | Wt 265.0 lb

## 2018-11-23 DIAGNOSIS — Z79899 Other long term (current) drug therapy: Secondary | ICD-10-CM | POA: Diagnosis not present

## 2018-11-23 DIAGNOSIS — I4819 Other persistent atrial fibrillation: Secondary | ICD-10-CM

## 2018-11-23 NOTE — Telephone Encounter (Signed)
   Primary Cardiologist: Minus Breeding, MD  Chart reviewed as part of pre-operative protocol coverage.   Patient just saw Dr. Rayann Heman today to f/u Tikosyn/afib/cardioversion. Will route to Dr. Rayann Heman to determine if stable to undergo procedure and when patient may begin holding Xarelto (DCCV 10/27/18). Dr. Rayann Heman, - Please route response to P CV DIV PREOP (the pre-op pool). Thank you. Will also route to pharm for input on how long to hold.   Charlie Pitter, PA-C 11/23/2018, 4:48 PM

## 2018-11-23 NOTE — Progress Notes (Signed)
Electrophysiology Office Note Date: 11/23/2018  ID:  [name] Collier, DOB 10-09-1946, MRN [id number]  PCP: Dione Housekeeper, MD Primary Cardiologist: Dr Percival Spanish Electrophysiologist: Dr Rayann Heman  CC: Follow up for atrial fibrillation  [name] Collier is a 72 y.o. female seen today for  routine electrophysiology followup.  Since last being seen in our clinic, the patient reports doing very well s/p dofetilide loading about one month ago. Her symptoms of fatigue and SOB have resolved with return to SR. She does report some increased lower extremity edema since her admission. Her amlodpine was increased in the hospital for elevated BP. She denies orthopnea or weight gain. No bleeding issues with Xarelto.  She denies chest pain, palpitations, dyspnea, PND, nausea, vomiting, dizziness, syncope, or early satiety.  Past Medical History:  Diagnosis Date  . GERD (gastroesophageal reflux disease)   . HTN (hypertension)   . Hyperlipidemia   . TIA (transient ischemic attack)    Past Surgical History:  Procedure Laterality Date  . CARDIOVERSION N/A 01/04/2018   Procedure: CARDIOVERSION;  Surgeon: Dorothy Spark, MD;  Location: [facility] ENDOSCOPY;  Service: Cardiovascular;  Laterality: N/A;  . CARDIOVERSION N/A 09/20/2018   Procedure: CARDIOVERSION;  Surgeon: Sueanne Margarita, MD;  Location: [facility] ENDOSCOPY;  Service: Cardiovascular;  Laterality: N/A;  . CARDIOVERSION N/A 10/27/2018   Procedure: CARDIOVERSION;  Surgeon: Buford Dresser, MD;  Location: [facility] ENDOSCOPY;  Service: Cardiovascular;  Laterality: N/A;  . KNEE SURGERY Bilateral    Torn miniscus  . RECTOCELE REPAIR    . TONSILLECTOMY    . VAGINAL HYSTERECTOMY      Current Outpatient Medications  Medication Sig Dispense Refill  . acetaminophen (TYLENOL) 500 MG tablet Take 1,000 mg by mouth daily as needed for moderate pain or headache.     Marland Kitchen amLODipine (NORVASC) 5 MG tablet Take 1 tablet (5 mg total) by mouth daily. 30 tablet 6  .  Cholecalciferol (VITAMIN D3) 5000 units CAPS Take 5,000 Units by mouth at bedtime.    . dofetilide (TIKOSYN) 500 MCG capsule Take 1 capsule (500 mcg total) by mouth 2 (two) times daily. 60 capsule 6  . ezetimibe (ZETIA) 10 MG tablet Take 10 mg by mouth daily.     . famotidine (PEPCID) 20 MG tablet Take 20 mg by mouth 2 (two) times daily.     . Potassium Chloride ER 20 MEQ TBCR Take 20 mEq by mouth daily. (Patient taking differently: Take 20 mEq by mouth every evening. ) 90 tablet 3  . XARELTO 20 MG TABS tablet TAKE 1 TABLET (20 MG TOTAL) BY MOUTH DAILY WITH SUPPER. 90 tablet 01  . furosemide (LASIX) 20 MG tablet Take 1 tablet (20 mg total) by mouth daily for 210 doses. (Patient taking differently: Take 20 mg by mouth every morning. ) 30 tablet 6   No current facility-administered medications for this visit.     Allergies:   Augmentin [amoxicillin-pot clavulanate]; Codeine; and Prednisone   Social History: Social History   Socioeconomic History  . Marital status: Widowed    Spouse name: Not on file  . Number of children: 2  . Years of education: 38  . Highest education level: Not on file  Occupational History  . Not on file  Social Needs  . Financial resource strain: Not on file  . Food insecurity:    Worry: Not on file    Inability: Not on file  . Transportation needs:    Medical: Not on file    Non-medical:  Not on file  Tobacco Use  . Smoking status: Never Smoker  . Smokeless tobacco: Never Used  Substance and Sexual Activity  . Alcohol use: No    Alcohol/week: 0.0 standard drinks  . Drug use: No  . Sexual activity: Not on file  Lifestyle  . Physical activity:    Days per week: Not on file    Minutes per session: Not on file  . Stress: Not on file  Relationships  . Social connections:    Talks on phone: Not on file    Gets together: Not on file    Attends religious service: Not on file    Active member of club or organization: Not on file    Attends meetings of  clubs or organizations: Not on file    Relationship status: Not on file  . Intimate partner violence:    Fear of current or ex partner: Not on file    Emotionally abused: Not on file    Physically abused: Not on file    Forced sexual activity: Not on file  Other Topics Concern  . Not on file  Social History Narrative   Lives alone   Caffeine use: Soda/tea daily    Family History: Family History  Problem Relation Age of Onset  . Hypertension Mother   . Hyperlipidemia Mother   . Neurologic Disorder Mother 31       GB  . Stroke Son   . Stroke Maternal Uncle   . Stroke Grandchild   . Prostate cancer Brother     Review of Systems: All other systems reviewed and are otherwise negative except as noted above.   Physical Exam: VS:  BP 132/86   Pulse 68   Ht 5\' 5"  (1.651 m)   Wt 265 lb (120.2 kg)   SpO2 98%   BMI 44.10 kg/m  , BMI Body mass index is 44.1 kg/m. Wt Readings from Last 3 Encounters:  11/23/18 265 lb (120.2 kg)  11/07/18 265 lb (120.2 kg)  10/28/18 263 lb 8 oz (119.5 kg)    GEN- The patient is well appearing, alert and oriented x 3 today.   HEENT: normocephalic, atraumatic; sclera clear, conjunctiva pink; hearing intact; oropharynx clear; neck supple, no JVP Lungs- Clear to ausculation bilaterally, normal work of breathing.  No wheezes, rales, rhonchi Heart- Regular rate and rhythm, no murmurs, rubs or gallops Extremities- no clubbing, cyanosis, non-pitting edema. MS- no significant deformity or atrophy Skin- warm and dry, no rash or lesion  Psych- euthymic mood, full affect Neuro- strength and sensation are intact   EKG:  EKG is ordered today. The ekg ordered today shows sinus rhythm HR 68, 1st degree AV block, PR 206, QRS 88, QTc 465  Recent Labs: 09/13/2018: Hemoglobin 11.9; Platelets 234 11/07/2018: BUN 10; Creatinine, Ser 0.72; Magnesium 2.0; Potassium 3.8; Sodium 138    Other studies Reviewed: Additional studies/ records that were reviewed today  include: Afib clinic notes   Assessment and Plan:  1. Persistent atrial fibrillation Appears to be maintaining SR s/p dofetilide loading. QTc stable. Continue dofetilide 500 mcg BID Continue Xarelto 20 mg daily for CHADS2VASC score of 5 (age, female, HTN, TIA) K+ 3.8 two weeks ago. She has been taking K+ 40 meq daily for the last two weeks. Repeat Bmet/mag today.  2. HTN Stable. We discussed the possibility that her non-pitting edema could be 2/2 her amlodipine. She prefers to stay with her amlodpine 5 mg daily for now.  3. Obesity Body mass  index is 44.1 kg/m. Lifestyle modifications encouraged. Patient in agreement.   Current medicines are reviewed at length with the patient today.   The patient does not have concerns regarding her medicines.  The following changes were made today:  none  Labs/ tests ordered today include:  Orders Placed This Encounter  Procedures  . Basic metabolic panel  . Magnesium  . EKG 12-Lead     Disposition:   Follow up with Afib clinic 3 months.   Army Fossa MD 11/23/2018 11:33 AM   [place] [address] [phone] (office) [phone] (fax)

## 2018-11-23 NOTE — Patient Instructions (Signed)
Medication Instructions:  Your physician recommends that you continue on your current medications as directed. Please refer to the Current Medication list given to you today.  * If you need a refill on your cardiac medications before your next appointment, please call your pharmacy.   Labwork: Today: BMET & Magnesium *We will only notify you of abnormal results, otherwise continue current treatment plan.  Testing/Procedures: None ordered  Follow-Up: Your physician recommends that you schedule a follow-up appointment in: 3 months with Ricky, PA in the AFib clinic.   Thank you for choosing [facility]!!

## 2018-11-24 LAB — BASIC METABOLIC PANEL
BUN / CREAT RATIO: 22 (ref 12–28)
BUN: 15 mg/dL (ref 8–27)
CALCIUM: 9.6 mg/dL (ref 8.7–10.3)
CO2: 22 mmol/L (ref 20–29)
Chloride: 101 mmol/L (ref 96–106)
Creatinine, Ser: 0.67 mg/dL (ref 0.57–1.00)
GFR, EST AFRICAN AMERICAN: 102 mL/min/{1.73_m2} (ref >59)
GFR, EST NON AFRICAN AMERICAN: 88 mL/min/{1.73_m2} (ref >59)
Glucose: 88 mg/dL (ref 65–99)
Potassium: 4.3 mmol/L (ref 3.5–5.2)
Sodium: 141 mmol/L (ref 134–144)

## 2018-11-24 LAB — MAGNESIUM: Magnesium: 2 mg/dL (ref 1.6–2.3)

## 2018-11-25 NOTE — Telephone Encounter (Signed)
Patient with diagnosis of Afib recently cardioverted on 10/27/18 (now >4 weeks post cardioversion) on Xarelto for anticoagulation.    Procedure: annal fissure repair Date of procedure: TBD  CHADS2-VASc score of  5 (CHF, HTN, AGE, DM2, stroke/tia x 2, CAD, AGE, female)  CrCl 128ml/min  Per office protocol, patient can hold Xarelto for 24 hours prior to procedure.

## 2018-11-29 NOTE — Telephone Encounter (Signed)
   Primary Cardiologist: Minus Breeding, MD  EP: Dr. Rayann Heman  Chart reviewed as part of pre-operative protocol coverage, delayed reply noted. RCRI 0.9% indicating low risk of CV complications. I called patient and she affirms she is feeling fantastic without CP, dyspnea, palpitations, syncope. Patient is now >4 weeks post DCCV. Per pharmacist, "Per office protocol, patient can hold Xarelto for 24 hours prior to procedure." Therefore, based on ACC/AHA guidelines, [name] Collier would be at acceptable risk for the planned procedure without further cardiovascular testing. She will keep [place] informed if any symptoms arise between now and surgery.  I will route this recommendation to the requesting party via Epic fax function and remove from pre-op pool.  Please call with questions.  Charlie Pitter, PA-C 11/29/2018, 12:54 PM

## 2018-12-05 ENCOUNTER — Other Ambulatory Visit: Payer: Self-pay | Admitting: Cardiology

## 2018-12-06 ENCOUNTER — Ambulatory Visit: Payer: Self-pay | Admitting: Surgery

## 2018-12-06 NOTE — H&P (Signed)
CC: Anal pain, hx of fissure - referral by Dr. Carlean Purl - here for f/u  HPI: [name] Collier is a very pleasant 72yoF with hx of HTN, GERD, colon polyps here today for evaluation of anal pain. She describes as beginning in February of this year. She describes the pain as being knifelike almost like a paper cut and the anal area that becomes worse at bowel movements and then gradually improves following. She reports a history many years ago of anal fissures that resolved on their own. She describes having 1-2 tablets per day and they vary in consistency. There typically soft informed but painful. She had 1 episode many weeks ago where she had some bright red blood on toilet paper after bowel movement. She denies any cramping or deep boring pelvic pain sensations and states this resolved anal opening and feels like she is passing a knife when she has a bowel movement. She denies any fever or chills. She denies any history of anal abscesses. Dr. Cristina Gong saw her for this 05/25/18 and did a digital rectal exam which she reported being quite uncomfortable. He has not palpated any masses or have any other abnormal findings. He did not see an anal fissure externally.  At baseline, she reports incontinence to gas and occasionally liquid stool with one accident per week. She denies any incontinence to solid stool. She occasionally wears a pad for this reason. She reports that she does not change her activity around her bowel movements or fear of incontinence  She reports having had a colonoscopy 1 year ago and states she is due for a repeat in one year for history of colon polyps Dr. Cristina Gong is following her for  INTERVAL HX She had been seen back in the office in September at which point time she had been applying nifedipine topically for approximately 1 month, 4-5 times per day. At that time she is having persistent symptoms. She was prescribed an additional month. She returns today for follow-up. She  reports no significant improvement in her symptoms. She is taking a daily fiber supplement, drinking 64 ounces of water per day and minimizing her time on the commode. She reports her stools being soft and easy to pass. She reports that with each bowel movement, she has a sharp knifelike sensation close to her tailbone that last for 30 minutes to an hour after a bowel movement.   PMH: Hypertension (well controlled on oral antihypertensive); GERD (well controlled on H2B); colon polyps being surveillanced by Dr. Cristina Gong; atrial fibrillation now on Eliquis and Tikosyn - follows with Dr. Rayann Heman  PSH: Cystocele plus rectocele repair 10-12 years ago by her gynecologist. She has had 2 prior vaginal deliveries but denies any complications around the time of birth including lacerations or episiotomies  FHx: Denies FHx of malignancy  Social: Denies use of tobacco/EtOH/drugs. She is retired.  ROS: A comprehensive 10 system review of systems was completed with the patient and pertinent findings as noted above.  The patient is a 72 year old female.   Allergies Alean Rinne, [place]; 11/16/2018 1:31 PM) Augmentin *PENICILLINS*  Allergies Reconciled   Medication History Alean Rinne, RMA; 11/16/2018 1:32 PM) AmLODIPine Besylate (2.5MG  Tablet, Oral) Active. Ezetimibe (10MG  Tablet, Oral) Active. Famotidine (20MG  Tablet, Oral) Active. Xarelto (20MG  Tablet, Oral) Active. Potassium Chloride ER (20MEQ Tablet ER, Oral) Active. Vitamin D3 (5000UNIT Tablet, Oral) Active. Dofetilide (500MCG Capsule, Oral) Active. LORazepam (0.5MG  Tablet, Oral) Active. Medications Reconciled    Review of Systems Harrell Gave M. Susette Seminara MD; 11/16/2018  1:48 PM) General Present- Fatigue. Not Present- Appetite Loss, Chills, Fever, Night Sweats, Weight Gain and Weight Loss. Skin Not Present- Change in Wart/Mole, Dryness, Hives, Jaundice, New Lesions, Non-Healing Wounds, Rash and Ulcer. HEENT Present- Wears  glasses/contact lenses. Not Present- Earache, Hearing Loss, Hoarseness, Nose Bleed, Oral Ulcers, Ringing in the Ears, Seasonal Allergies, Sinus Pain, Sore Throat, Visual Disturbances and Yellow Eyes. Respiratory Present- Difficulty Breathing and Snoring. Not Present- Bloody sputum, Chronic Cough and Wheezing. Breast Not Present- Breast Mass, Breast Pain, Nipple Discharge and Skin Changes. Cardiovascular Present- Shortness of Breath and Swelling of Extremities. Not Present- Chest Pain, Difficulty Breathing Lying Down, Leg Cramps, Palpitations and Rapid Heart Rate. Gastrointestinal Present- Bloating, Difficulty Swallowing, Excessive gas, Hemorrhoids, Indigestion and Rectal Pain. Not Present- Abdominal Pain, Bloody Stool, Change in Bowel Habits, Chronic diarrhea, Constipation, Gets full quickly at meals, Nausea and Vomiting. Female Genitourinary Not Present- Frequency, Nocturia, Painful Urination, Pelvic Pain and Urgency. Musculoskeletal Not Present- Back Pain, Joint Pain, Joint Stiffness, Muscle Pain, Muscle Weakness and Swelling of Extremities. Neurological Not Present- Decreased Memory, Fainting, Headaches, Numbness, Seizures, Tingling, Tremor, Trouble walking and Weakness. Psychiatric Not Present- Anxiety, Bipolar, Change in Sleep Pattern, Depression, Fearful and Frequent crying. Endocrine Present- Hair Changes. Not Present- Cold Intolerance, Excessive Hunger, Heat Intolerance, Hot flashes and New Diabetes. Hematology Present- Blood Thinners and Easy Bruising. Not Present- Excessive bleeding, Gland problems, HIV and Persistent Infections.  Vitals Mardene Celeste King RMA; 11/16/2018 1:31 PM) 11/16/2018 1:30 PM Weight: 268 lb Height: 65in Body Surface Area: 2.24 m Body Mass Index: 44.6 kg/m  Temp.: 98.28F  Pulse: 95 (Regular)  BP: 140/82 (Sitting, Left Arm, Standard)       Physical Exam Harrell Gave M. Vallie Teters MD; 11/16/2018 1:50 PM) The physical exam findings are as  follows: Note:Constitutional: No acute distress; conversant; no deformities Eyes: Moist conjunctiva; no lid lag; anicteric sclerae; pupils equal round and reactive to light Neck: Trachea midline; no palpable thyromegaly Lungs: Normal respiratory effort; no tactile fremitus CV: Regular rate and rhythm; no palpable thrill; no pitting edema GI: Abdomen soft, nontender, nondistended; no palpable hepatosplenomegaly Anorectal: External skin tags, the distal most aspect of what appears to be a fissure in the posterior midline is visible today - previously not demonstrated. There is also what appears to be a tiny sentinel pile. Anoscopy: not performed today at her request MSK: Normal gait; no clubbing/cyanosis Psychiatric: Appropriate affect; alert and oriented 3 Lymphatic: No palpable cervical or axillary lymphadenopathy **A chaperone, Lennart Pall, was present for the entire physical exam    Assessment & Plan Harrell Gave M. Sanvika Cuttino MD; 11/16/2018 1:53 PM) ANAL FISSURE (K60.2) Story: Ms. Toutant is a very pleasant 27yoF with hx of HTN, GERD, colon polyps, afib (on Eliquis - follows with Dr. 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 Given that she had persistent symptoms despite topical applications, softening of her stools, and no real alleviation, as well as what appears to be a posterior midline anal fissure, we discussed proceeding with anorectal examination or anesthesia. Assuming we are able to definitively prove she has a anal fissure, would plan injection of Botox given her baseline issues with incontinence to gas and occasionally to liquid stool. -The anatomy and physiology of the anal canal was discussed at length with the patient. The pathophysiology of anal fissures was discussed at length with associated illustrations as well. -The planned procedure, material risks (including, but not  limited to, pain,  bleeding, infection, scarring, need for blood transfusion, damage to anal sphincter, incontinence of gas and/or stool, need for additional procedures, recurrence, pneumonia, heart attack, stroke, death) benefits and alternatives to surgery were discussed at length. The patient's questions were answered to her satisfaction, she voiced understanding and elected to proceed with surgery. Additionally, we discussed typical postoperative expectations and the recovery process. -We are requesting cardiac clearance from her cardiologist as well as a plan for holding her blood thinner perioperatively -Will plan to schedule once we receive her clearances  Signed electronically by Ileana Roup, MD (11/16/2018 1:53 PM)

## 2018-12-19 NOTE — Progress Notes (Signed)
11/23/2018- noted in [place]- office visit with Dr. Rayann Heman and EKG  10/27/2018- noted in [place]  09/20/2018- noted in [place]  09/13/2018- noted in [place] note from Jory Sims, NP for Dr. Percival Spanish  01/04/2018- noted in [place]  11/17/2017- noted in [place]- Echo and Holter monitoring

## 2018-12-19 NOTE — Patient Instructions (Addendum)
[name] Collier  12/19/2018   Your procedure is scheduled on: Thursday 12/22/2018  Report to [facility] Main  Entrance              Report to admitting at   1030 AM    Call this number if you have problems the morning of surgery [phone]    Remember: Do not eat food or drink liquids :After Midnight.               BRUSH YOUR TEETH MORNING OF SURGERY AND RINSE YOUR MOUTH OUT, NO CHEWING GUM CANDY OR MINTS.     Take these medicines the morning of surgery with A SIP OF WATER:  Amlodipine (Norvasc), Dofetilide (Tikosyn), Famotidine (Pepcid)                                You may not have any metal on your body including hair pins and              piercings  Do not wear jewelry, make-up, lotions, powders or perfumes, deodorant             Do not wear nail polish.  Do not shave  48 hours prior to surgery.             Do not bring valuables to the hospital. [place].  Contacts, dentures or bridgework may not be worn into surgery.  Leave suitcase in the car. After surgery it may be brought to your room.     Patients discharged the day of surgery will not be allowed to drive home. IF YOU ARE HAVING SURGERY AND GOING HOME THE SAME DAY, YOU MUST HAVE AN ADULT TO DRIVE YOU HOME AND BE WITH YOU FOR 24 HOURS. YOU MAY GO HOME BY TAXI OR UBER OR ORTHERWISE, BUT AN ADULT MUST ACCOMPANY YOU HOME AND STAY WITH YOU FOR 24 HOURS.  Name and phone number of your driver:sister- [name] and [name]               Please read over the following fact sheets you were given: _____________________________________________________________________             [facility] - Preparing for Surgery Before surgery, you can play an important role.  Because skin is not sterile, your skin needs to be as free of germs as possible.  You can reduce the number of germs on your skin by washing with CHG (chlorahexidine gluconate) soap before surgery.   CHG is an antiseptic cleaner which kills germs and bonds with the skin to continue killing germs even after washing. Please DO NOT use if you have an allergy to CHG or antibacterial soaps.  If your skin becomes reddened/irritated stop using the CHG and inform your nurse when you arrive at Short Stay. Do not shave (including legs and underarms) for at least 48 hours prior to the first CHG shower.  You may shave your face/neck. Please follow these instructions carefully:  1.  Shower with CHG Soap the night before surgery and the  morning of Surgery.  2.  If you choose to wash your hair, wash your hair first as usual with your  normal  shampoo.  3.  After you shampoo, rinse  your hair and body thoroughly to remove the  shampoo.                           4.  Use CHG as you would any other liquid soap.  You can apply chg directly  to the skin and wash                       Gently with a scrungie or clean washcloth.  5.  Apply the CHG Soap to your body ONLY FROM THE NECK DOWN.   Do not use on face/ open                           Wound or open sores. Avoid contact with eyes, ears mouth and genitals (private parts).                       Wash face,  Genitals (private parts) with your normal soap.             6.  Wash thoroughly, paying special attention to the area where your surgery  will be performed.  7.  Thoroughly rinse your body with warm water from the neck down.  8.  DO NOT shower/wash with your normal soap after using and rinsing off  the CHG Soap.                9.  Pat yourself dry with a clean towel.            10.  Wear clean pajamas.            11.  Place clean sheets on your bed the night of your first shower and do not  sleep with pets. Day of Surgery : Do not apply any lotions/deodorants the morning of surgery.  Please wear clean clothes to the hospital/surgery center.  FAILURE TO FOLLOW THESE INSTRUCTIONS MAY RESULT IN THE CANCELLATION OF YOUR SURGERY PATIENT  SIGNATURE_________________________________  NURSE SIGNATURE__________________________________  ________________________________________________________________________

## 2018-12-20 ENCOUNTER — Other Ambulatory Visit: Payer: Self-pay

## 2018-12-20 ENCOUNTER — Encounter (HOSPITAL_COMMUNITY)
Admission: RE | Admit: 2018-12-20 | Discharge: 2018-12-20 | Disposition: A | Discharge status: Home / Self Care | Payer: Medicare Other | Source: Ambulatory Visit | Attending: Surgery | Admitting: Surgery

## 2018-12-20 ENCOUNTER — Encounter (HOSPITAL_COMMUNITY): Payer: Self-pay | Admitting: *Deleted

## 2018-12-20 DIAGNOSIS — I4891 Unspecified atrial fibrillation: Secondary | ICD-10-CM | POA: Diagnosis not present

## 2018-12-20 DIAGNOSIS — K219 Gastro-esophageal reflux disease without esophagitis: Secondary | ICD-10-CM | POA: Diagnosis not present

## 2018-12-20 DIAGNOSIS — Z01812 Encounter for preprocedural laboratory examination: Secondary | ICD-10-CM

## 2018-12-20 DIAGNOSIS — K601 Chronic anal fissure: Secondary | ICD-10-CM

## 2018-12-20 DIAGNOSIS — Z880 Allergy status to penicillin: Secondary | ICD-10-CM | POA: Diagnosis not present

## 2018-12-20 DIAGNOSIS — E785 Hyperlipidemia, unspecified: Secondary | ICD-10-CM | POA: Diagnosis not present

## 2018-12-20 DIAGNOSIS — Z6841 Body Mass Index (BMI) 40.0 and over, adult: Secondary | ICD-10-CM | POA: Diagnosis not present

## 2018-12-20 DIAGNOSIS — I10 Essential (primary) hypertension: Secondary | ICD-10-CM | POA: Diagnosis not present

## 2018-12-20 DIAGNOSIS — Z881 Allergy status to other antibiotic agents status: Secondary | ICD-10-CM | POA: Diagnosis not present

## 2018-12-20 DIAGNOSIS — Z79899 Other long term (current) drug therapy: Secondary | ICD-10-CM | POA: Diagnosis not present

## 2018-12-20 DIAGNOSIS — Z7901 Long term (current) use of anticoagulants: Secondary | ICD-10-CM | POA: Diagnosis not present

## 2018-12-20 DIAGNOSIS — Z8673 Personal history of transient ischemic attack (TIA), and cerebral infarction without residual deficits: Secondary | ICD-10-CM | POA: Diagnosis not present

## 2018-12-20 NOTE — Progress Notes (Signed)
12/16/18- labs from Dr. Connye Burkitt, Lipid panel, CMP, CBC w/diff. On chart

## 2018-12-21 MED ORDER — BUPIVACAINE LIPOSOME 1.3 % IJ SUSP
20.0000 mL | Freq: Once | INTRAMUSCULAR | Status: DC
  Filled 2018-12-21: qty 20

## 2018-12-22 ENCOUNTER — Ambulatory Visit (HOSPITAL_COMMUNITY): Payer: Medicare Other | Admitting: Anesthesiology

## 2018-12-22 ENCOUNTER — Ambulatory Visit (HOSPITAL_COMMUNITY)
Admission: RE | Admit: 2018-12-22 | Discharge: 2018-12-22 | Disposition: A | Discharge status: Home / Self Care | Payer: Medicare Other | Attending: Surgery | Admitting: Surgery

## 2018-12-22 ENCOUNTER — Telehealth: Payer: Self-pay | Admitting: Internal Medicine

## 2018-12-22 ENCOUNTER — Encounter (HOSPITAL_COMMUNITY)
Admission: RE | Disposition: A | Discharge status: Home / Self Care | Payer: Self-pay | Source: Home / Self Care | Attending: Surgery

## 2018-12-22 ENCOUNTER — Ambulatory Visit (HOSPITAL_COMMUNITY): Payer: Medicare Other | Admitting: Physician Assistant

## 2018-12-22 ENCOUNTER — Encounter (HOSPITAL_COMMUNITY): Payer: Self-pay | Admitting: Anesthesiology

## 2018-12-22 DIAGNOSIS — Z8673 Personal history of transient ischemic attack (TIA), and cerebral infarction without residual deficits: Secondary | ICD-10-CM | POA: Insufficient documentation

## 2018-12-22 DIAGNOSIS — Z881 Allergy status to other antibiotic agents status: Secondary | ICD-10-CM | POA: Insufficient documentation

## 2018-12-22 DIAGNOSIS — Z880 Allergy status to penicillin: Secondary | ICD-10-CM | POA: Insufficient documentation

## 2018-12-22 DIAGNOSIS — Z7901 Long term (current) use of anticoagulants: Secondary | ICD-10-CM | POA: Insufficient documentation

## 2018-12-22 DIAGNOSIS — K601 Chronic anal fissure: Secondary | ICD-10-CM | POA: Diagnosis not present

## 2018-12-22 DIAGNOSIS — I10 Essential (primary) hypertension: Secondary | ICD-10-CM | POA: Insufficient documentation

## 2018-12-22 DIAGNOSIS — Z6841 Body Mass Index (BMI) 40.0 and over, adult: Secondary | ICD-10-CM | POA: Insufficient documentation

## 2018-12-22 DIAGNOSIS — E785 Hyperlipidemia, unspecified: Secondary | ICD-10-CM | POA: Insufficient documentation

## 2018-12-22 DIAGNOSIS — Z79899 Other long term (current) drug therapy: Secondary | ICD-10-CM | POA: Insufficient documentation

## 2018-12-22 DIAGNOSIS — K219 Gastro-esophageal reflux disease without esophagitis: Secondary | ICD-10-CM | POA: Diagnosis not present

## 2018-12-22 DIAGNOSIS — I4891 Unspecified atrial fibrillation: Secondary | ICD-10-CM | POA: Diagnosis not present

## 2018-12-22 HISTORY — PX: RECTAL EXAM UNDER ANESTHESIA: SHX6399

## 2018-12-22 HISTORY — PX: BOTOX INJECTION: SHX5754

## 2018-12-22 SURGERY — EXAM UNDER ANESTHESIA, RECTUM
Anesthesia: Monitor Anesthesia Care

## 2018-12-22 MED ORDER — 0.9 % SODIUM CHLORIDE (POUR BTL) OPTIME
TOPICAL | Status: DC | PRN
  Administered 2018-12-22: 1000 mL

## 2018-12-22 MED ORDER — ONABOTULINUMTOXINA 100 UNITS IJ SOLR
100.0000 [IU] | Freq: Once | INTRAMUSCULAR | Status: AC
  Administered 2018-12-22: 100 [IU] via INTRAMUSCULAR
  Filled 2018-12-22: qty 100

## 2018-12-22 MED ORDER — BUPIVACAINE-EPINEPHRINE (PF) 0.25% -1:200000 IJ SOLN
INTRAMUSCULAR | Status: AC
  Filled 2018-12-22: qty 30

## 2018-12-22 MED ORDER — PROPOFOL 10 MG/ML IV BOLUS
INTRAVENOUS | Status: DC | PRN
  Administered 2018-12-22 (×2): 20 mg via INTRAVENOUS

## 2018-12-22 MED ORDER — CHLORHEXIDINE GLUCONATE CLOTH 2 % EX PADS: 6.0000 | MEDICATED_PAD | Freq: Once | CUTANEOUS | Status: DC

## 2018-12-22 MED ORDER — ONDANSETRON HCL 4 MG/2ML IJ SOLN: 4.0000 mg | Freq: Once | INTRAMUSCULAR | Status: DC | PRN

## 2018-12-22 MED ORDER — MIDAZOLAM HCL 2 MG/2ML IJ SOLN
INTRAMUSCULAR | Status: AC
  Filled 2018-12-22: qty 2

## 2018-12-22 MED ORDER — PROPOFOL 10 MG/ML IV BOLUS
INTRAVENOUS | Status: AC
  Filled 2018-12-22: qty 20

## 2018-12-22 MED ORDER — ACETAMINOPHEN 500 MG PO TABS
1000.0000 mg | ORAL_TABLET | ORAL | Status: AC
  Administered 2018-12-22: 1000 mg via ORAL
  Filled 2018-12-22: qty 2

## 2018-12-22 MED ORDER — SODIUM CHLORIDE (PF) 0.9 % IJ SOLN
INTRAMUSCULAR | Status: AC
  Filled 2018-12-22: qty 10

## 2018-12-22 MED ORDER — FENTANYL CITRATE (PF) 100 MCG/2ML IJ SOLN
INTRAMUSCULAR | Status: DC | PRN
  Administered 2018-12-22: 50 ug via INTRAVENOUS

## 2018-12-22 MED ORDER — BUPIVACAINE-EPINEPHRINE (PF) 0.25% -1:200000 IJ SOLN
INTRAMUSCULAR | Status: DC | PRN
  Administered 2018-12-22: 30 mL

## 2018-12-22 MED ORDER — LACTATED RINGERS IV SOLN
INTRAVENOUS | Status: DC
  Administered 2018-12-22: 11:00:00 via INTRAVENOUS

## 2018-12-22 MED ORDER — TRAMADOL HCL 50 MG PO TABS: 50.0000 mg | ORAL_TABLET | Freq: Four times a day (QID) | ORAL | 0 refills | Status: AC | PRN

## 2018-12-22 MED ORDER — BUPIVACAINE LIPOSOME 1.3 % IJ SUSP
INTRAMUSCULAR | Status: DC | PRN
  Administered 2018-12-22: 20 mL

## 2018-12-22 MED ORDER — FENTANYL CITRATE (PF) 100 MCG/2ML IJ SOLN
INTRAMUSCULAR | Status: AC
  Filled 2018-12-22: qty 2

## 2018-12-22 MED ORDER — PROPOFOL 500 MG/50ML IV EMUL
INTRAVENOUS | Status: DC | PRN
  Administered 2018-12-22: 100 ug/kg/min via INTRAVENOUS

## 2018-12-22 MED ORDER — MIDAZOLAM HCL 5 MG/5ML IJ SOLN
INTRAMUSCULAR | Status: DC | PRN
  Administered 2018-12-22: 2 mg via INTRAVENOUS

## 2018-12-22 MED ORDER — FENTANYL CITRATE (PF) 100 MCG/2ML IJ SOLN: 25.0000 ug | INTRAMUSCULAR | Status: DC | PRN

## 2018-12-22 MED ORDER — SODIUM CHLORIDE (PF) 0.9 % IJ SOLN
INTRAMUSCULAR | Status: DC | PRN
  Administered 2018-12-22: 2 mL

## 2018-12-22 MED ORDER — GABAPENTIN 300 MG PO CAPS
300.0000 mg | ORAL_CAPSULE | ORAL | Status: AC
  Administered 2018-12-22: 300 mg via ORAL
  Filled 2018-12-22: qty 1

## 2018-12-22 MED ORDER — PROPOFOL 10 MG/ML IV BOLUS
INTRAVENOUS | Status: AC
  Filled 2018-12-22: qty 40

## 2018-12-22 SURGICAL SUPPLY — 25 items
BRIEF STRETCH FOR OB PAD LRG (UNDERPADS AND DIAPERS) ×3 IMPLANT
COVER WAND RF STERILE (DRAPES) ×2 IMPLANT
DRAPE LAPAROTOMY T 102X78X121 (DRAPES) ×2 IMPLANT
DRSG PAD ABDOMINAL 8X10 ST (GAUZE/BANDAGES/DRESSINGS) IMPLANT
ELECT REM PT RETURN 15FT ADLT (MISCELLANEOUS) ×3 IMPLANT
GAUZE SPONGE 4X4 12PLY STRL (GAUZE/BANDAGES/DRESSINGS) ×2 IMPLANT
GLOVE BIO SURGEON STRL SZ7.5 (GLOVE) ×3 IMPLANT
GLOVE INDICATOR 8.0 STRL GRN (GLOVE) ×3 IMPLANT
GOWN STRL REUS W/TWL XL LVL3 (GOWN DISPOSABLE) ×6 IMPLANT
KIT BASIN OR (CUSTOM PROCEDURE TRAY) ×3 IMPLANT
LEGGING LITHOTOMY PAIR STRL (DRAPES) ×2 IMPLANT
NDL HYPO 25X1 1.5 SAFETY (NEEDLE) IMPLANT
NEEDLE HYPO 22GX1.5 SAFETY (NEEDLE) ×3 IMPLANT
NEEDLE HYPO 25X1 1.5 SAFETY (NEEDLE) ×3 IMPLANT
PACK GENERAL/GYN (CUSTOM PROCEDURE TRAY) ×3 IMPLANT
PAD ABD 8X10 STRL (GAUZE/BANDAGES/DRESSINGS) ×2 IMPLANT
SPONGE HEMORRHOID 8X3CM (HEMOSTASIS) ×2 IMPLANT
SURGILUBE 2OZ TUBE FLIPTOP (MISCELLANEOUS) ×3 IMPLANT
SUT MNCRL AB 4-0 PS2 18 (SUTURE) ×3 IMPLANT
SUT VIC AB 3-0 SH 27 (SUTURE) ×3
SUT VIC AB 3-0 SH 27X BRD (SUTURE) ×1 IMPLANT
SYR 20CC LL (SYRINGE) ×3 IMPLANT
SYR 27GX1/2 1ML LL SAFETY (SYRINGE) ×2 IMPLANT
TOWEL OR 17X26 10 PK STRL BLUE (TOWEL DISPOSABLE) ×3 IMPLANT
TOWEL OR NON WOVEN STRL DISP B (DISPOSABLE) ×3 IMPLANT

## 2018-12-22 NOTE — H&P (Signed)
CC: Anal pain, hx of fissure - referral by [name] Collier - here for f/u  HPI: [name] Collier is a very pleasant 75yoF with hx of HTN, GERD, colon polyps here today for evaluation of anal pain. She describes as beginning in February of this year. She describes the pain as being knifelike almost like a paper cut and the anal area that becomes worse at bowel movements and then gradually improves following. She reports a history many years ago of anal fissures that resolved on their own. She describes having 1-2 tablets per day and they vary in consistency. There typically soft informed but painful. She had 1 episode many weeks ago where she had some bright red blood on toilet paper after bowel movement. She denies any cramping or deep boring pelvic pain sensations and states this resolved anal opening and feels like she is passing a knife when she has a bowel movement. She denies any fever or chills. She denies any history of anal abscesses. [name] Collier saw her for this 05/25/18 and did a digital rectal exam which she reported being quite uncomfortable. He has not palpated any masses or have any other abnormal findings. He did not see an anal fissure externally.  At baseline, she reports incontinence to gas and occasionally liquid stool with one accident per week. She denies any incontinence to solid stool. She occasionally wears a pad for this reason. She reports that she does not change her activity around her bowel movements or fear of incontinence  She reports having had a colonoscopy 1 year ago and states she is due for a repeat in one year for history of colon polyps [name] Collier is following her for  She had been seen back in the office in September at which point time she had been applying nifedipine topically for approximately 1 month, 4-5 times per day. At that time she is having persistent symptoms. She was prescribed an additional month. She returned for follow-up. She reported no  significant improvement in her symptoms. She was taking a daily fiber supplement, drinking 64 ounces of water per day and minimizing her time on the commode. She reports her stools being soft and easy to pass. She reports that with each bowel movement, she has a sharp knifelike sensation close to her tailbone that last for 30 minutes to an hour after a bowel movement.  Past Medical History:  Diagnosis Date  . GERD (gastroesophageal reflux disease)   . HTN (hypertension)   . Hyperlipidemia   . TIA (transient ischemic attack)     Past Surgical History:  Procedure Laterality Date  . CARDIOVERSION N/A 01/04/2018   Procedure: CARDIOVERSION;  Surgeon: [name] Spark, MD;  Location: [facility] ENDOSCOPY;  Service: Cardiovascular;  Laterality: N/A;  . CARDIOVERSION N/A 09/20/2018   Procedure: CARDIOVERSION;  Surgeon: [name] Margarita, MD;  Location: [facility] ENDOSCOPY;  Service: Cardiovascular;  Laterality: N/A;  . CARDIOVERSION N/A 10/27/2018   Procedure: CARDIOVERSION;  Surgeon: [name] Dresser, MD;  Location: [facility] ENDOSCOPY;  Service: Cardiovascular;  Laterality: N/A;  . KNEE SURGERY Bilateral    Torn miniscus  . RECTOCELE REPAIR    . TONSILLECTOMY    . VAGINAL HYSTERECTOMY      Family History  Problem Relation Age of Onset  . Hypertension Mother   . Hyperlipidemia Mother   . Neurologic Disorder Mother 49       GB  . Stroke Son   . Stroke Maternal Uncle   . Stroke Grandchild   . Prostate  cancer Brother     Social:  reports that she has never smoked. She has never used smokeless tobacco. She reports that she does not drink alcohol or use drugs.  Allergies:  Allergies  Allergen Reactions  . Augmentin [Amoxicillin-Pot Clavulanate] Other (See Comments)    c-diff Has patient had a PCN reaction causing immediate rash, facial/tongue/throat swelling, SOB or lightheadedness with hypotension: No Has patient had a PCN reaction causing severe rash involving mucus membranes or skin necrosis:  No Has patient had a PCN reaction that required hospitalization: No Has patient had a PCN reaction occurring within the last 10 years: Yes If all of the above answers are "NO", then may proceed with Cephalosporin use.   . Codeine Palpitations and Rash  . Prednisone Other (See Comments)    Jittery, red in the face    Medications: I have reviewed the patient's current medications.  No results found for this or any previous visit (from the past 48 hour(s)).  No results found.  ROS - all of the below systems have been reviewed with the patient and positives are indicated with bold text General: chills, fever or night sweats Eyes: blurry vision or double vision ENT: epistaxis or sore throat Allergy/Immunology: itchy/watery eyes or nasal congestion Hematologic/Lymphatic: bleeding problems, blood clots or swollen lymph nodes Endocrine: temperature intolerance or unexpected weight changes Breast: new or changing breast lumps or nipple discharge Resp: cough, shortness of breath, or wheezing CV: chest pain or dyspnea on exertion GI: as per HPI GU: dysuria, trouble voiding, or hematuria MSK: joint pain or joint stiffness Neuro: TIA or stroke symptoms Derm: pruritus and skin lesion changes Psych: anxiety and depression  PE Blood pressure (!) 181/81, pulse 62, temperature 97.7 F (36.5 C), temperature source Oral, resp. rate (!) 24, height 5\' 6"  (1.676 m), weight 120.2 kg, SpO2 96 %. Constitutional: NAD; conversant; no deformities Eyes: Moist conjunctiva; no lid lag; anicteric; PERRL Neck: Trachea midline; no thyromegaly Lungs: Normal respiratory effort; no tactile fremitus CV: RRR; no palpable thrills; no pitting edema GI: Abd soft, NT/ND; no palpable hepatosplenomegaly MSK: Normal gait; no clubbing/cyanosis Psychiatric: Appropriate affect; alert and oriented x3 Lymphatic: No palpable cervical or axillary lymphadenopathy   A/P: [name] Collier is a very pleasant 63yoF with hx of HTN,  GERD, colon polyps, afib (on Eliquis - follows with Dr. Rayann Collier) here today for surgery for chronic anal fissure  -I discussed options with her moving forward. Given that she had persistent symptoms despite topical applications, softening of her stools, and no real alleviation, as well as what appears to be a posterior midline anal fissure, we discussed proceeding with anorectal examination or anesthesia. Assuming we are able to definitively prove she has a anal fissure, would plan injection of Botox given her baseline issues with incontinence to gas and occasionally to liquid stool. -The anatomy and physiology of the anal canal was discussed at length with the patient. The pathophysiology of anal fissures was discussed at length with associated illustrations as well. -The planned procedure, material risks (including, but not limited to, pain, bleeding, infection, scarring, need for blood transfusion, damage to anal sphincter, incontinence of gas and/or stool, need for additional procedures, recurrence, pneumonia, heart attack, stroke, death) benefits and alternatives to surgery were discussed at length. The patient's questions were answered to her satisfaction, she voiced understanding and elected to proceed with surgery. Additionally, we discussed typical postoperative expectations and the recovery process.  [name] Collier, M.D. General and Colorectal Surgery [facility]

## 2018-12-22 NOTE — Anesthesia Postprocedure Evaluation (Signed)
Anesthesia Post Note  Patient: [name] Collier  Procedure(s) Performed: ANORECTAL  EXAM UNDER ANESTHESIA (N/A ) BOTOX INJECTION INTO ANAL SPHINCTER (N/A )     Patient location during evaluation: PACU Anesthesia Type: MAC Level of consciousness: awake and alert and oriented Pain management: pain level controlled Vital Signs Assessment: post-procedure vital signs reviewed and stable Respiratory status: spontaneous breathing, nonlabored ventilation and respiratory function stable Cardiovascular status: stable and blood pressure returned to baseline Postop Assessment: no apparent nausea or vomiting Anesthetic complications: no    Last Vitals:  Vitals:   12/22/18 1230 12/22/18 1242  BP: (!) 141/63 137/62  Pulse: 67 63  Resp: 19 20  Temp:  36.5 C  SpO2: 95% 95%    Last Pain:  Vitals:   12/22/18 1242  TempSrc:   PainSc: 3                  Ellawyn Wogan A.

## 2018-12-22 NOTE — Telephone Encounter (Signed)
Returned call to Pt.  Advised ok to take Tramadol for pain.

## 2018-12-22 NOTE — Transfer of Care (Signed)
Immediate Anesthesia Transfer of Care Note  Patient: [name] Collier  Procedure(s) Performed: ANORECTAL  EXAM UNDER ANESTHESIA (N/A ) BOTOX INJECTION INTO ANAL SPHINCTER (N/A )  Patient Location: PACU  Anesthesia Type:MAC  Level of Consciousness: sedated  Airway & Oxygen Therapy: Patient Spontanous Breathing and Patient connected to face mask oxygen  Post-op Assessment: Report given to RN and Post -op Vital signs reviewed and stable  Post vital signs: Reviewed and stable  Last Vitals:  Vitals Value Taken Time  BP 125/53 12/22/2018 12:10 PM  Temp 36.4 C 12/22/2018 12:10 PM  Pulse 66 12/22/2018 12:12 PM  Resp 28 12/22/2018 12:12 PM  SpO2 100 % 12/22/2018 12:12 PM  Vitals shown include unvalidated device data.  Last Pain:  Vitals:   12/22/18 1056  TempSrc:   PainSc: 4       Patients Stated Pain Goal: 3 (75/88/32 [id number])  Complications: No apparent anesthesia complications

## 2018-12-22 NOTE — Anesthesia Preprocedure Evaluation (Addendum)
Anesthesia Evaluation  Patient identified by MRN, date of birth, ID band Patient awake    Reviewed: Allergy & Precautions, NPO status , Patient's Chart, lab work & pertinent test results  Airway Mallampati: II  TM Distance: >3 FB Neck ROM: Full    Dental no notable dental hx. (+) Teeth Intact   Pulmonary    Pulmonary exam normal breath sounds clear to auscultation       Cardiovascular hypertension, Pt. on medications + dysrhythmias Atrial Fibrillation  Rhythm:Regular Rate:Normal     Neuro/Psych Anxiety TIA   GI/Hepatic Neg liver ROS, GERD  Medicated and Controlled,Chronic anal fissure   Endo/Other  Morbid obesity  Renal/GU negative Renal ROS  negative genitourinary   Musculoskeletal negative musculoskeletal ROS (+)   Abdominal   Peds  Hematology  (+) anemia , Xarelto therapy Tikosyn therapy   Anesthesia Other Findings   Reproductive/Obstetrics                           Anesthesia Physical Anesthesia Plan  ASA: III  Anesthesia Plan: MAC   Post-op Pain Management:    Induction: Intravenous  PONV Risk Score and Plan: 2 and Propofol infusion, Ondansetron and Treatment may vary due to age or medical condition  Airway Management Planned: Natural Airway, Simple Face Mask and Nasal Cannula  Additional Equipment:   Intra-op Plan:   Post-operative Plan:   Informed Consent: I have reviewed the patients History and Physical, chart, labs and discussed the procedure including the risks, benefits and alternatives for the proposed anesthesia with the patient or authorized representative who has indicated his/her understanding and acceptance.     Dental advisory given  Plan Discussed with: CRNA and Surgeon  Anesthesia Plan Comments:         Anesthesia Quick Evaluation

## 2018-12-22 NOTE — Op Note (Signed)
12/22/2018  12:07 PM  PATIENT:  [name] Collier  73 y.o. female  Patient Care Team: Dione Housekeeper, MD as PCP - General (Family Medicine) Minus Breeding, MD as PCP - Cardiology (Cardiology) Thompson Grayer, MD as PCP - Electrophysiology (Cardiology)  PRE-OPERATIVE DIAGNOSIS:  Chronic anal fissure  POST-OPERATIVE DIAGNOSIS:  Same  PROCEDURE:   1. Anorectal exam under anesthesia 2. Injection of botox into intersphincteric groove (anus)  SURGEON:  Surgeon(s): Ileana Roup, MD  ASSISTANT: OR Staff   ANESTHESIA:   MAC  SPECIMEN:  No Specimen  DISPOSITION OF SPECIMEN:  N/A  COUNTS:  Sponge, needle and instrument counts were reported correct x2 at conclusion of the procedure.  PLAN OF CARE: Discharge to home after PACU  PATIENT DISPOSITION:  PACU - hemodynamically stable.  INDICATION: [name] is a very pleasant 60yoF with hx of HTN, GERD, colon polyps here today for evaluation of anal pain. She describes as beginning in February of this year. She describes the pain as being knifelike almost like a paper cut and the anal area that becomes worse at bowel movements and then gradually improves following. She reports a history many years ago of anal fissures that resolved on their own. She describes having 1-2 tablets per day and they vary in consistency. There typically soft informed but painful. She had 1 episode many weeks ago where she had some bright red blood on toilet paper after bowel movement. She denies any cramping or deep boring pelvic pain sensations and states this resolved anal opening and feels like she is passing a knife when she has a bowel movement. She denies any fever or chills. She denies any history of anal abscesses. Dr. Cristina Gong saw her for this 05/25/18 and did a digital rectal exam which she reported being quite uncomfortable. He has not palpated any masses or have any other abnormal findings. He did not see an anal fissure externally.  At  baseline, she reports incontinence to gas and occasionally liquid stool with one accident per week. She denies any incontinence to solid stool. She occasionally wears a pad for this reason. She reports that she does not change her activity around her bowel movements or fear of incontinence  She reports having had a colonoscopy 1 year ago and states she is due for a repeat in one year for history of colon polyps Dr. Cristina Gong is following her for  She had been seen back in the office in September at which point time she had been applying nifedipine topically for approximately 1 month, 4-5 times per day. At that time she is having persistent symptoms. She was prescribed an additional month. She returned for follow-up. She reported no significant improvement in her symptoms. She was taking a daily fiber supplement, drinking 64 ounces of water per day and minimizing her time on the commode. She reports her stools being soft and easy to pass. She reports that with each bowel movement, she has a sharp knifelike sensation close to her tailbone that last for 30 minutes to an hour after a bowel movement.  On exam she had findings consistent with posterior midline anal fissure - visualization somewhat limited due to habitus and redundant hemorrhoidal tissue. Options were discussed and she opted to pursue surgical intervention with trial of botox. Please refer to H&P for details regarding this discusion.  OR FINDINGS: 3 column boggy internal hemorrhoids. Increased anal tone. Posterior midline anal fissure with exposed internal sphincter muscle. 100 U of botox reconstituted in 2 mL  of saline was injected into the intersphincteric groove on both the left and right side.  DESCRIPTION: The patient was identified in the preoperative holding area and taken to the OR where she was placed on the operating room table. SCDs were placed. MAC   anesthesia was induced without difficulty. The patient was then positioned  in high lithotomy with Allen stirrups.  The patient was then prepped and draped in usual sterile fashion.  A surgical timeout was performed indicating the correct patient, procedure, positioning.  A rectal block was performed using a dilute mixture of Marcaine with epinephrine and Exparel. A digital rectal exam was performed and notable for solid stool in rectal vault. Circumferential anoscopy was then performed using a lubricated Hill Ferguson retractor. A posterior midline anal fissure was identified with exposed internal sphincter muscle.  3, internal hemorrhoids which are somewhat boggy were also identified.  No other notable findings.  100 units of Botox was then reconstituted in 2 mLs of injectable saline.  This was then injected on both the left and right sides in the intersphincteric groove.  Hemostasis was then verified. A piece of Proctofoam was then placed in the anal canal.  The area was then covered with 4 x 4 gauze, an ABD pad, and secured with mesh underwear.  The patient was then awakened from anesthesia, taken out of the lithotomy position, and transferred to a stretcher for transport to PACU in satisfactory condition  DISPOSITION: PACU in satisfactory condition.

## 2018-12-22 NOTE — Discharge Instructions (Signed)
ANORECTAL SURGERY: POST OP INSTRUCTIONS  1. DIET: Follow a light bland diet the first 24 hours after arrival home, such as soup, liquids, crackers, etc.  Be sure to include lots of fluids daily.  Avoid fast food or heavy meals as your are more likely to get nauseated.  2. Take your usually prescribed home medications unless otherwise directed.  3. PAIN CONTROL: a. It is helpful to take an over-the-counter pain medication regularly for the first few days/weeks.  Choose from the following that works best for you: i. Ibuprofen (Advil, etc) Three 200mg  tabs every 6 hours as needed. ii. Acetaminophen (Tylenol, etc) 500-650mg  every 6 hours as needed iii. NOTE: You may take both of these medications together - most patients find it most helpful when alternating between the two (i.e. Ibuprofen at 6am, tylenol at 9am, ibuprofen at 12pm ...) b. A  prescription for pain medication may have been prescribed for you at discharge.  Take your pain medication as prescribed.  i. If you are having problems/concerns with the prescription medicine, please call us for further advice.  4. Avoid getting constipated.  Between the surgery and the pain medications, it is common to experience some constipation.  Increasing fluid intake (64oz of water per day) and taking a fiber supplement (such as Metamucil, Citrucel, FiberCon) 1-2 times a day regularly will usually help prevent this problem from occurring.  Take Miralax (over the counter) 1-2x/day while taking a narcotic pain medication. If no bowel movement after 48hours, you may additionally take a laxative like a bottle of Milk of Magnesia which can be purchased over the counter. Avoid enemas if possible as these are often painful.  5. Wash / shower every day.  If you were discharged with a dressing, you may remove this the day of or day after your surgery. You may shower normally, getting soap/water on your wound, particularly after bowel movements. A piece of foam was  intentionally left in the anal canal. This may look like a tampon and pass with your first bowel movement. This is nothing to be concerned about.  6. Soaking in a warm bath filled a couple inches ("Sitz bath") is a great way to clean the area after a bowel movement and many patients find it is a way to soothe the area. 7. Some bleeding with bowel movements is normal for the fist couple days after your procedure.  8. ACTIVITIES as tolerated:   a. You may resume regular (light) daily activities beginning the next day--such as daily self-care, walking, climbing stairs--gradually increasing activities as tolerated.  If you can walk 30 minutes without difficulty, it is safe to try more intense activity such as jogging, treadmill, bicycling, low-impact aerobics, etc. b. Refrain from any heavy lifting or straining for the first 2 weeks after your procedure, particularly if your surgery was for hemorrhoids. c. Avoid activities that make your pain worse d. You may drive when you are no longer taking prescription pain medication, you can comfortably wear a seatbelt, and you can safely maneuver your car and apply brakes.  9. FOLLOW UP in our office a. Please call [facility] at [phone] to set up an appointment to see your surgeon in the office for a follow-up appointment approximately 2 weeks after your surgery. b. Make sure that you call for this appointment the day you arrive home to insure a convenient appointment time.  9. If you have disability or family leave forms that need to be completed, you may have them completed  by your primary care physician's office; for return to work instructions, please ask our office staff and they will be happy to assist you in obtaining this documentation   When to call us [phone]: 1. Poor pain control 2. Reactions / problems with new medications (rash/itching, etc)  3. Fever over 101.5 F (38.5 C) 4. Inability to urinate 5. Nausea/vomiting 6. Worsening swelling  or bruising 7. Continued bleeding from incision. 8. Increased pain, redness, or drainage from the incision  The clinic staff is available to answer your questions during regular business hours (8:30am-5pm).  Please dont hesitate to call and ask to speak to one of our nurses for clinical concerns.   A surgeon from [facility] is always on call at the hospitals   If you have a medical emergency, go to the nearest emergency room or call 911.   [facility], [place], [place], [place], [place]  [address] ? MAIN: [phone] FAX [phone] [email or web address]

## 2018-12-22 NOTE — Telephone Encounter (Signed)
New Message:    Pt had a procedure this morning. They gave her Tramadol to take for pain.l. She wants to know if this is alright for her to take with her other medicine?

## 2018-12-23 ENCOUNTER — Encounter (HOSPITAL_COMMUNITY): Payer: Self-pay | Admitting: Surgery

## 2019-01-08 ENCOUNTER — Other Ambulatory Visit: Payer: Self-pay | Admitting: Cardiology

## 2019-01-09 NOTE — Telephone Encounter (Signed)
Rx request sent to pharmacy.  

## 2019-01-17 ENCOUNTER — Other Ambulatory Visit (HOSPITAL_COMMUNITY): Payer: Self-pay | Admitting: *Deleted

## 2019-02-22 ENCOUNTER — Encounter (HOSPITAL_COMMUNITY): Payer: Self-pay | Admitting: Physician Assistant

## 2019-02-22 ENCOUNTER — Ambulatory Visit (HOSPITAL_COMMUNITY): Payer: Medicare Other | Admitting: Physician Assistant

## 2019-02-22 ENCOUNTER — Ambulatory Visit (HOSPITAL_COMMUNITY)
Admission: RE | Admit: 2019-02-22 | Discharge: 2019-02-22 | Disposition: A | Discharge status: Home / Self Care | Payer: Medicare Other | Source: Ambulatory Visit | Attending: Physician Assistant | Admitting: Physician Assistant

## 2019-02-22 ENCOUNTER — Other Ambulatory Visit: Payer: Self-pay

## 2019-02-22 VITALS — BP 134/82 | HR 71 | Ht 66.0 in | Wt 261.0 lb

## 2019-02-22 DIAGNOSIS — Z880 Allergy status to penicillin: Secondary | ICD-10-CM | POA: Insufficient documentation

## 2019-02-22 DIAGNOSIS — Z7901 Long term (current) use of anticoagulants: Secondary | ICD-10-CM | POA: Insufficient documentation

## 2019-02-22 DIAGNOSIS — K219 Gastro-esophageal reflux disease without esophagitis: Secondary | ICD-10-CM | POA: Insufficient documentation

## 2019-02-22 DIAGNOSIS — Z8249 Family history of ischemic heart disease and other diseases of the circulatory system: Secondary | ICD-10-CM | POA: Insufficient documentation

## 2019-02-22 DIAGNOSIS — E785 Hyperlipidemia, unspecified: Secondary | ICD-10-CM | POA: Insufficient documentation

## 2019-02-22 DIAGNOSIS — Z823 Family history of stroke: Secondary | ICD-10-CM | POA: Diagnosis not present

## 2019-02-22 DIAGNOSIS — Z8673 Personal history of transient ischemic attack (TIA), and cerebral infarction without residual deficits: Secondary | ICD-10-CM | POA: Insufficient documentation

## 2019-02-22 DIAGNOSIS — Z888 Allergy status to other drugs, medicaments and biological substances status: Secondary | ICD-10-CM | POA: Diagnosis not present

## 2019-02-22 DIAGNOSIS — Z6841 Body Mass Index (BMI) 40.0 and over, adult: Secondary | ICD-10-CM | POA: Diagnosis not present

## 2019-02-22 DIAGNOSIS — Z79899 Other long term (current) drug therapy: Secondary | ICD-10-CM | POA: Insufficient documentation

## 2019-02-22 DIAGNOSIS — I4819 Other persistent atrial fibrillation: Secondary | ICD-10-CM | POA: Insufficient documentation

## 2019-02-22 DIAGNOSIS — Z885 Allergy status to narcotic agent status: Secondary | ICD-10-CM | POA: Insufficient documentation

## 2019-02-22 DIAGNOSIS — I10 Essential (primary) hypertension: Secondary | ICD-10-CM | POA: Insufficient documentation

## 2019-02-22 DIAGNOSIS — E669 Obesity, unspecified: Secondary | ICD-10-CM | POA: Insufficient documentation

## 2019-02-22 LAB — BASIC METABOLIC PANEL
Anion gap: 8 (ref 5–15)
BUN: 9 mg/dL (ref 8–23)
CO2: 26 mmol/L (ref 22–32)
Calcium: 9.6 mg/dL (ref 8.9–10.3)
Chloride: 105 mmol/L (ref 98–111)
Creatinine, Ser: 0.72 mg/dL (ref 0.44–1.00)
GFR calc Af Amer: >60 mL/min (ref >60)
GFR calc non Af Amer: >60 mL/min (ref >60)
Glucose, Bld: 93 mg/dL (ref 70–99)
Potassium: 3.6 mmol/L (ref 3.5–5.1)
Sodium: 139 mmol/L (ref 135–145)

## 2019-02-22 LAB — MAGNESIUM: Magnesium: 2.1 mg/dL (ref 1.7–2.4)

## 2019-02-22 NOTE — Progress Notes (Signed)
Primary Care Physician: Dione Housekeeper, MD Referring Physician: Dr. Radford Pax Cardiologist: Dr. Percival Spanish EP: Dr. [name] Hearing is a 73 y.o. female with a h/o afib, HTN, hyperlipidemia and TIA. SHe had a cardioversion in January of 2018 and it held until recently when she was found to be back in afib. This was associated with increase in LLE and shortness of breath/fatigue. She was scheduled for cardioversion which was unsuccessful. She  had a sleep study and was not found to have any sleep apnea. No alcohol use. No tobacco or excessive caffeine.. She is obese and has chronic  issues with LLE, improved with lasix.  Since last being seen in our clinic, she reports doing very well. She did report one episode of palpitations which lasted a few hours. She did admit to several cups of coffee that morning. No further episodes.  Today, she denies symptoms of chest pain, shortness of breath, orthopnea, PND, lower extremity edema, dizziness, presyncope, syncope, or neurologic sequela. The patient is tolerating medications without difficulties and is otherwise without complaint today.   Past Medical History:  Diagnosis Date  . GERD (gastroesophageal reflux disease)   . HTN (hypertension)   . Hyperlipidemia   . TIA (transient ischemic attack)    Past Surgical History:  Procedure Laterality Date  . BOTOX INJECTION N/A 12/22/2018   Procedure: BOTOX INJECTION INTO ANAL SPHINCTER;  Surgeon: Ileana Roup, MD;  Location: [facility] ORS;  Service: General;  Laterality: N/A;  . CARDIOVERSION N/A 01/04/2018   Procedure: CARDIOVERSION;  Surgeon: Dorothy Spark, MD;  Location: [facility] ENDOSCOPY;  Service: Cardiovascular;  Laterality: N/A;  . CARDIOVERSION N/A 09/20/2018   Procedure: CARDIOVERSION;  Surgeon: Sueanne Margarita, MD;  Location[facility] ENDOSCOPY;  Service: Cardiovascular;  Laterality: N/A;  . CARDIOVERSION N/A 10/27/2018   Procedure: CARDIOVERSION;  Surgeon: Buford Dresser, MD;  Location:  [facility] ENDOSCOPY;  Service: Cardiovascular;  Laterality: N/A;  . KNEE SURGERY Bilateral    Torn miniscus  . RECTAL EXAM UNDER ANESTHESIA N/A 12/22/2018   Procedure: ANORECTAL  EXAM UNDER ANESTHESIA;  Surgeon: Ileana Roup, MD;  Location: [facility] ORS;  Service: General;  Laterality: N/A;  . [place]    . TONSILLECTOMY    . VAGINAL HYSTERECTOMY      Current Outpatient Medications  Medication Sig Dispense Refill  . acetaminophen (TYLENOL) 500 MG tablet Take 500 mg by mouth every 6 (six) hours as needed for moderate pain or headache.     Marland Kitchen amLODipine (NORVASC) 5 MG tablet Take 1 tablet (5 mg total) by mouth daily. 30 tablet 6  . Cholecalciferol (VITAMIN D3) 5000 units CAPS Take 5,000 Units by mouth at bedtime.    . dofetilide (TIKOSYN) 500 MCG capsule Take 1 capsule (500 mcg total) by mouth 2 (two) times daily. 60 capsule 6  . ezetimibe (ZETIA) 10 MG tablet Take 10 mg by mouth daily.     . famotidine (PEPCID) 20 MG tablet Take 20 mg by mouth 2 (two) times daily.     . furosemide (LASIX) 20 MG tablet Take 1 tablet (20 mg total) by mouth daily for 210 doses. 30 tablet 6  . Potassium Chloride ER 20 MEQ TBCR Take 20 mEq by mouth at bedtime. 90 tablet 3  . XARELTO 20 MG TABS tablet TAKE 1 TABLET (20 MG TOTAL) BY MOUTH DAILY WITH SUPPER. 90 tablet 01   No current facility-administered medications for this encounter.     Allergies  Allergen Reactions  . Augmentin [  Amoxicillin-Pot Clavulanate] Other (See Comments)    c-diff Has patient had a PCN reaction causing immediate rash, facial/tongue/throat swelling, SOB or lightheadedness with hypotension: No Has patient had a PCN reaction causing severe rash involving mucus membranes or skin necrosis: No Has patient had a PCN reaction that required hospitalization: No Has patient had a PCN reaction occurring within the last 10 years: Yes If all of the above answers are "NO", then may proceed with Cephalosporin use.   . Codeine Palpitations and  Rash  . Prednisone Other (See Comments)    Jittery, red in the face    Social History   Socioeconomic History  . Marital status: Widowed    Spouse name: Not on file  . Number of children: 2  . Years of education: 58  . Highest education level: Not on file  Occupational History  . Not on file  Social Needs  . Financial resource strain: Not on file  . Food insecurity:    Worry: Not on file    Inability: Not on file  . Transportation needs:    Medical: Not on file    Non-medical: Not on file  Tobacco Use  . Smoking status: Never Smoker  . Smokeless tobacco: Never Used  Substance and Sexual Activity  . Alcohol use: No    Alcohol/week: 0.0 standard drinks  . Drug use: No  . Sexual activity: Not on file  Lifestyle  . Physical activity:    Days per week: Not on file    Minutes per session: Not on file  . Stress: Not on file  Relationships  . Social connections:    Talks on phone: Not on file    Gets together: Not on file    Attends religious service: Not on file    Active member of club or organization: Not on file    Attends meetings of clubs or organizations: Not on file    Relationship status: Not on file  . Intimate partner violence:    Fear of current or ex partner: Not on file    Emotionally abused: Not on file    Physically abused: Not on file    Forced sexual activity: Not on file  Other Topics Concern  . Not on file  Social History Narrative   Lives alone   Caffeine use: Soda/tea daily    Family History  Problem Relation Age of Onset  . Hypertension Mother   . Hyperlipidemia Mother   . Neurologic Disorder Mother 29       GB  . Stroke Son   . Stroke Maternal Uncle   . Stroke Grandchild   . Prostate cancer Brother     ROS- All systems are reviewed and negative except as per the HPI above  Physical Exam: Vitals:   02/22/19 1434  BP: 134/82  Pulse: 71  Weight: 118.4 kg  Height: 5\' 6"  (1.676 m)   Wt Readings from Last 3 Encounters:  02/22/19  118.4 kg  12/22/18 120.2 kg  11/23/18 120.2 kg    Labs: Lab Results  Component Value Date   NA 141 11/23/2018   K 4.3 11/23/2018   CL 101 11/23/2018   CO2 22 11/23/2018   GLUCOSE 88 11/23/2018   BUN 15 11/23/2018   CREATININE 0.67 11/23/2018   CALCIUM 9.6 11/23/2018   MG 2.0 11/23/2018   Lab Results  Component Value Date   INR 1.10 02/26/2016   Lab Results  Component Value Date   CHOL 202 (H) 02/27/2016  HDL 44 02/27/2016   LDLCALC 139 (H) 02/27/2016   TRIG 97 02/27/2016    GEN- The patient is well appearing obese female, alert and oriented x 3 today.   HEENT-head normocephalic, atraumatic, sclera clear, conjunctiva pink, hearing intact, trachea midline. Lungs- Clear to ausculation bilaterally, normal work of breathing Heart- Regular rate and rhythm, no murmurs, rubs or gallops  GI- soft, NT, ND, + BS Extremities- no clubbing, cyanosis, or edema MS- no significant deformity or atrophy Skin- no rash or lesion Psych- euthymic mood, full affect Neuro- strength and sensation are intact   EKG- SR HR 71, Lad, slow R wave prog, PR 184, QRS 184, QTc 447  Echo-Study Conclusions  - Left ventricle: The cavity size was normal. There was mild   concentric hypertrophy. Systolic function was normal. The   estimated ejection fraction was in the range of 60% to 65%. Wall   motion was normal; there were no regional wall motion   abnormalities. - Aortic valve: Transvalvular velocity was within the normal range.   There was no stenosis. There was no regurgitation. - Aorta: Ascending aortic diameter: 38 mm (S). - Ascending aorta: The ascending aorta was mildly dilated. - Mitral valve: Mildly calcified annulus. Transvalvular velocity   was within the normal range. There was no evidence for stenosis.   There was mild regurgitation. - Left atrium: The atrium was mildly dilated.LAD 57 mm, volume 78 ml - Right ventricle: The cavity size was normal. Wall thickness was   normal.  Systolic function was normal. - Atrial septum: No defect or patent foramen ovale was identified. - Tricuspid valve: There was mild-moderate regurgitation. - Pulmonic valve: There was moderate regurgitation. - Pulmonary arteries: Systolic pressure was severely increased. PA   peak pressure: 69 mm Hg (S).   Assessment and Plan: 1. Persistent atrial fibrillation Maintaining  SR on Tikosyn s/p successful cardioversion. Doing well with only one brief episode. Continue Tikosyn 500 mcg BID. QT stable. Continue Xarelto 20 mg daily.  Bmet/mag today   This patients CHA2DS2-VASc Score and unadjusted Ischemic Stroke Rate (% per year) is equal to 7.2 % stroke rate/year from a score of 5  Above score calculated as 1 point each if present [CHF, HTN, DM, Vascular=MI/PAD/Aortic Plaque, Age if 65-74, or Female] Above score calculated as 2 points each if present [Age > 75, or Stroke/TIA/TE]  2. HTN Stable, no changes today.  3. Obesity  Body mass index is 42.13 kg/m. Lifestyle modifications encouraged including regular physical activity and weight loss.   F/u in the Afib clinic in 3 months.  [facility] [address] [place], [place] [address] [phone]

## 2019-02-23 ENCOUNTER — Other Ambulatory Visit (HOSPITAL_COMMUNITY): Payer: Self-pay | Admitting: *Deleted

## 2019-02-23 DIAGNOSIS — I4819 Other persistent atrial fibrillation: Secondary | ICD-10-CM

## 2019-02-23 MED ORDER — POTASSIUM CHLORIDE ER 20 MEQ PO TBCR: 20.0000 meq | EXTENDED_RELEASE_TABLET | Freq: Two times a day (BID) | ORAL | 3 refills | Status: DC

## 2019-02-27 ENCOUNTER — Other Ambulatory Visit (HOSPITAL_COMMUNITY): Payer: Self-pay | Admitting: Nurse Practitioner

## 2019-02-27 DIAGNOSIS — I4819 Other persistent atrial fibrillation: Secondary | ICD-10-CM

## 2019-03-03 ENCOUNTER — Other Ambulatory Visit: Payer: Medicare Other

## 2019-03-03 ENCOUNTER — Other Ambulatory Visit: Payer: Self-pay

## 2019-03-03 DIAGNOSIS — I4819 Other persistent atrial fibrillation: Secondary | ICD-10-CM

## 2019-03-04 LAB — BASIC METABOLIC PANEL
BUN/Creatinine Ratio: 15 (ref 12–28)
BUN: 12 mg/dL (ref 8–27)
CO2: 24 mmol/L (ref 20–29)
Calcium: 9.6 mg/dL (ref 8.7–10.3)
Chloride: 102 mmol/L (ref 96–106)
Creatinine, Ser: 0.79 mg/dL (ref 0.57–1.00)
GFR calc Af Amer: 86 mL/min/{1.73_m2} (ref >59)
GFR calc non Af Amer: 75 mL/min/{1.73_m2} (ref >59)
Glucose: 89 mg/dL (ref 65–99)
Potassium: 4.5 mmol/L (ref 3.5–5.2)
Sodium: 143 mmol/L (ref 134–144)

## 2019-03-14 ENCOUNTER — Ambulatory Visit (INDEPENDENT_AMBULATORY_CARE_PROVIDER_SITE_OTHER): Payer: Medicare Other | Admitting: Family Medicine

## 2019-03-14 ENCOUNTER — Other Ambulatory Visit: Payer: Self-pay

## 2019-03-14 ENCOUNTER — Encounter: Payer: Self-pay | Admitting: Family Medicine

## 2019-03-14 VITALS — BP 147/67 | HR 67 | Temp 97.6°F | Ht 66.0 in | Wt 263.0 lb

## 2019-03-14 DIAGNOSIS — F411 Generalized anxiety disorder: Secondary | ICD-10-CM

## 2019-03-14 DIAGNOSIS — Z7689 Persons encountering health services in other specified circumstances: Secondary | ICD-10-CM

## 2019-03-14 DIAGNOSIS — E876 Hypokalemia: Secondary | ICD-10-CM | POA: Diagnosis not present

## 2019-03-14 DIAGNOSIS — F320 Major depressive disorder, single episode, mild: Secondary | ICD-10-CM

## 2019-03-14 DIAGNOSIS — I480 Paroxysmal atrial fibrillation: Secondary | ICD-10-CM

## 2019-03-14 MED ORDER — LORATADINE 10 MG PO TABS: 10.0000 mg | ORAL_TABLET | Freq: Every day | ORAL | 11 refills | Status: DC

## 2019-03-14 MED ORDER — DULOXETINE HCL 30 MG PO CPEP: 30.0000 mg | ORAL_CAPSULE | Freq: Every day | ORAL | 1 refills | Status: DC

## 2019-03-14 NOTE — Addendum Note (Signed)
Addended by: [name] on: 03/14/2019 10:43 AM   Modules accepted: Orders

## 2019-03-14 NOTE — Patient Instructions (Signed)
We are starting cymbalta.  We discussed alternative options but I think that the Cymbalta would be lower risk for you given your current medications and heart history.  Taking the medicine as directed and not missing any doses is one of the best things you can do to treat your depression.  Here are some things to keep in mind:  1) Side effects (stomach upset, some increased anxiety) may happen before you notice a benefit.  These side effects typically go away over time. 2) Changes to your dose of medicine or a change in medication all together is sometimes necessary 3) Most people need to be on medication at least 12 months 4) Many people will notice an improvement within two weeks but the full effect of the medication can take up to 4-6 weeks 5) Stopping the medication when you start feeling better often results in a return of symptoms 6) Never discontinue your medication without contacting a health care professional first.  Some medications require gradual discontinuation/ taper and can make you sick if you stop them abruptly.  If your symptoms worsen or you have thoughts of suicide/homicide, PLEASE SEEK IMMEDIATE MEDICAL ATTENTION.  You may always call:  National Suicide Hotline: (402) 707-6004 [place]: 9595755219 Crisis Recovery in [place]: 775-097-8486   These are available 24 hours a day, 7 days a week.

## 2019-03-14 NOTE — Progress Notes (Signed)
Subjective: WE:[id number] care, mood HPI: [name] Collier is a 73 y.o. female presenting to clinic today for:  1. Depression/ anxiety Patient reports that she was diagnosed with depression/anxiety about 8 years ago after the passing of her husband.  She had been stable on Lexapro until December when this was discontinued because Tikosyn was started for atrial fibrillation and there was a drug drug interaction.  She notes that symptoms have progressively gotten worse since discontinuing the Lexapro and she is wondering if there are any alternatives.  She reports mood lability, increased anxiety, difficulty with sleep, panic.  She tries to meditate and take deep breaths but this does not seem to be controlling her symptoms sufficiently.  Family history is significant for anxiety.  2. Afib Patient diagnosed with atrial fibrillation last year and started on Tikosyn and Xarelto.  She reports very rare palpitations that seem to be associated with excessive caffeine intake.  She denies any abnormal bleeding including hematochezia, melena, vaginal bleeding.  No chest pain or shortness of breath.  Overall she is doing well.  She follows up intervally with Dr. Rayann Heman.  Past Medical History:  Diagnosis Date  . GERD (gastroesophageal reflux disease)   . HTN (hypertension)   . Hyperlipidemia   . TIA (transient ischemic attack)    Past Surgical History:  Procedure Laterality Date  . BOTOX INJECTION N/A 12/22/2018   Procedure: BOTOX INJECTION INTO ANAL SPHINCTER;  Surgeon: Ileana Roup, MD;  Location: [facility] ORS;  Service: General;  Laterality: N/A;  . CARDIOVERSION N/A 01/04/2018   Procedure: CARDIOVERSION;  Surgeon: Dorothy Spark, MD;  Location: [facility] ENDOSCOPY;  Service: Cardiovascular;  Laterality: N/A;  . CARDIOVERSION N/A 09/20/2018   Procedure: CARDIOVERSION;  Surgeon: Sueanne Margarita, MD;  Location: [facility] ENDOSCOPY;  Service: Cardiovascular;  Laterality: N/A;  . CARDIOVERSION N/A 10/27/2018   Procedure: CARDIOVERSION;  Surgeon: Buford Dresser, MD;  Location: [facility] ENDOSCOPY;  Service: Cardiovascular;  Laterality: N/A;  . KNEE SURGERY Bilateral    Torn miniscus  . RECTAL EXAM UNDER ANESTHESIA N/A 12/22/2018   Procedure: ANORECTAL  EXAM UNDER ANESTHESIA;  Surgeon: Ileana Roup, MD;  Location: [facility] ORS;  Service: General;  Laterality: N/A;  . [place]    . TONSILLECTOMY    . VAGINAL HYSTERECTOMY     Social History   Socioeconomic History  . Marital status: Widowed    Spouse name: Not on file  . Number of children: 2  . Years of education: 30  . Highest education level: Not on file  Occupational History  . Not on file  Social Needs  . Financial resource strain: Not on file  . Food insecurity:    Worry: Not on file    Inability: Not on file  . Transportation needs:    Medical: Not on file    Non-medical: Not on file  Tobacco Use  . Smoking status: Never Smoker  . Smokeless tobacco: Never Used  Substance and Sexual Activity  . Alcohol use: No    Alcohol/week: 0.0 standard drinks  . Drug use: No  . Sexual activity: Not on file  Lifestyle  . Physical activity:    Days per week: Not on file    Minutes per session: Not on file  . Stress: Not on file  Relationships  . Social connections:    Talks on phone: Not on file    Gets together: Not on file    Attends religious service: Not on file    Active  member of club or organization: Not on file    Attends meetings of clubs or organizations: Not on file    Relationship status: Not on file  . Intimate partner violence:    Fear of current or ex partner: Not on file    Emotionally abused: Not on file    Physically abused: Not on file    Forced sexual activity: Not on file  Other Topics Concern  . Not on file  Social History Narrative   Lives alone   Caffeine use: Soda/tea daily   No outpatient medications have been marked as taking for the 03/14/19 encounter (Office Visit) with [name],  DO.   Family History  Problem Relation Age of Onset  . Hypertension Mother   . Hyperlipidemia Mother   . Neurologic Disorder Mother 14       GB  . Stroke Son   . Stroke Maternal Uncle   . Stroke Grandchild   . Prostate cancer Brother    Allergies  Allergen Reactions  . Augmentin [Amoxicillin-Pot Clavulanate] Other (See Comments)    c-diff Has patient had a PCN reaction causing immediate rash, facial/tongue/throat swelling, SOB or lightheadedness with hypotension: No Has patient had a PCN reaction causing severe rash involving mucus membranes or skin necrosis: No Has patient had a PCN reaction that required hospitalization: No Has patient had a PCN reaction occurring within the last 10 years: Yes If all of the above answers are "NO", then may proceed with Cephalosporin use.   . Codeine Palpitations and Rash  . Prednisone Other (See Comments)    Jittery, red in the face     Health Maintenance: Unsure ROS: Per HPI  Objective: Office vital signs reviewed. BP (!) 147/67   Pulse 67   Temp 97.6 F (36.4 C) (Oral)   Ht 5\' 6"  (1.676 m)   Wt 263 lb (119.3 kg)   BMI 42.45 kg/m   Physical Examination:  General: Awake, alert, well nourished, No acute distress HEENT: Normal, sclera white, MMM Cardio: slightly bradycardic w/ regular rhythm, S1S2 heard, soft systolic murmur appreciated Pulm: clear to auscultation bilaterally, no wheezes, rhonchi or rales; normal work of breathing on room air Extremities: warm, well perfused, No edema, cyanosis or clubbing; +2 pulses bilaterally MSK: normal gait and station Skin: dry; intact; no rashes or lesions Neuro: Aox3, follows commands. No tremor Psych: Mood somewhat depressed.  Patient intermittently tearful.  Pleasant interactive.  Does not appear to be responding to internal stimuli.  Depression screen PHQ 2/9 03/14/2019  Decreased Interest 1  Down, Depressed, Hopeless 1  PHQ - 2 Score 2  Altered sleeping 3  Tired, decreased energy 3   Change in appetite 3  Feeling bad or failure about yourself  1  Trouble concentrating 3  Moving slowly or fidgety/restless 1  Suicidal thoughts 0  PHQ-9 Score 16   GAD 7 : Generalized Anxiety Score 03/14/2019  Nervous, Anxious, on Edge 3  Control/stop worrying 3  Worry too much - different things 3  Trouble relaxing 3  Restless 1  Easily annoyed or irritable 1  Afraid - awful might happen 3  Total GAD 7 Score 17    Assessment/ Plan: 73 y.o. female   1. Hypokalemia I reviewed her last potassium level in the seem to be in normal range.  She is actually taking 81 M EQ's of potassium daily.  Recheck potassium level and magnesium level - Basic Metabolic Panel - Magnesium  2. Depression, major, single episode, mild (  [place]) Not controlled.  Will start with low-dose Cymbalta with anticipation that we will need to increase to 60 mg daily.  We discussed consideration for SSRIs but this is a class C given use of Tikosyn.  Lexapro is not recommended given its QTC prolongation effect.  She will follow-up in 4 to 6 weeks for interval check. - DULoxetine (CYMBALTA) 30 MG capsule; Take 1 capsule (30 mg total) by mouth daily.  Dispense: 30 capsule; Refill: 1  3. Generalized anxiety disorder As above  - DULoxetine (CYMBALTA) 30 MG capsule; Take 1 capsule (30 mg total) by mouth daily.  Dispense: 30 capsule; Refill: 1  4. Establishing care with new doctor, encounter for Records are visible through care everywhere  5. PAF (paroxysmal atrial fibrillation) (HCC) Rate and rhythm controlled.  Continue current regimen as prescribed by cardiology   [name], [place] [phone]

## 2019-03-15 LAB — BASIC METABOLIC PANEL
BUN/Creatinine Ratio: 15 (ref 12–28)
BUN: 11 mg/dL (ref 8–27)
CO2: 24 mmol/L (ref 20–29)
Calcium: 9.6 mg/dL (ref 8.7–10.3)
Chloride: 105 mmol/L (ref 96–106)
Creatinine, Ser: 0.71 mg/dL (ref 0.57–1.00)
GFR calc Af Amer: 98 mL/min/{1.73_m2} (ref >59)
GFR calc non Af Amer: 85 mL/min/{1.73_m2} (ref >59)
Glucose: 97 mg/dL (ref 65–99)
Potassium: 4.4 mmol/L (ref 3.5–5.2)
Sodium: 142 mmol/L (ref 134–144)

## 2019-03-15 LAB — MAGNESIUM: Magnesium: 2 mg/dL (ref 1.6–2.3)

## 2019-03-17 ENCOUNTER — Other Ambulatory Visit (HOSPITAL_COMMUNITY): Payer: Self-pay | Admitting: *Deleted

## 2019-04-18 ENCOUNTER — Other Ambulatory Visit: Payer: Self-pay

## 2019-04-18 ENCOUNTER — Ambulatory Visit (INDEPENDENT_AMBULATORY_CARE_PROVIDER_SITE_OTHER): Payer: Medicare Other | Admitting: Family Medicine

## 2019-04-18 ENCOUNTER — Encounter: Payer: Self-pay | Admitting: Family Medicine

## 2019-04-18 DIAGNOSIS — E876 Hypokalemia: Secondary | ICD-10-CM

## 2019-04-18 DIAGNOSIS — F320 Major depressive disorder, single episode, mild: Secondary | ICD-10-CM

## 2019-04-18 DIAGNOSIS — F411 Generalized anxiety disorder: Secondary | ICD-10-CM | POA: Diagnosis not present

## 2019-04-18 MED ORDER — DULOXETINE HCL 30 MG PO CPEP: 30.0000 mg | ORAL_CAPSULE | Freq: Every day | ORAL | 0 refills | Status: DC

## 2019-04-18 NOTE — Progress Notes (Signed)
Telephone visit  Subjective: CC: f/u Depression/ anxiety PCP: [name], DO TFT:DDUKG AVLEEN Collier is a 73 y.o. female calls for telephone consult today. Patient provides verbal consent for consult held via phone.  Location of patient: home Location of provider: Working remotely from home Others present for call: none  1. Depression/ anxiety History: Diagnosed around 2012 after the passing of her husband.  Had been treated with Lexapro until this was discontinued December 2019 when she was started on Tikosyn for atrial fibrillation.  Family history significant for anxiety disorder  Patient was last seen for depression anxiety in April.  At that time she was having symptoms after being discontinued off of Lexapro secondary to initiation of Tikosyn for atrial fibrillation.  She had described progressive worsening of symptoms which she described his mood lability, increased anxiety, insomnia and panic attacks.  These symptoms were unrelieved by meditation.  She was started on Cymbalta 30 mg daily and reports quite a bit of improvement in her symptoms.  She notes that she is sleeping much better.  She continues to have intermittent anxiety but feels that this is situational.  She is not having any unwanted side effects from the Cymbalta.  Overall she is very pleased with how the medication is going.  2.  Hypokalemia Patient reports compliance with her potassium twice daily but notes that she has quite a bit of difficulty swallowing the new tablets from the pharmacy.  She is wondering if there is a way that she can crush or half the pill.  ROS: Per HPI  Allergies  Allergen Reactions  . Augmentin [Amoxicillin-Pot Clavulanate] Other (See Comments)    c-diff Has patient had a PCN reaction causing immediate rash, facial/tongue/throat swelling, SOB or lightheadedness with hypotension: No Has patient had a PCN reaction causing severe rash involving mucus membranes or skin necrosis: No Has  patient had a PCN reaction that required hospitalization: No Has patient had a PCN reaction occurring within the last 10 years: Yes If all of the above answers are "NO", then may proceed with Cephalosporin use.   . Codeine Palpitations and Rash  . Prednisone Other (See Comments)    Jittery, red in the face   Past Medical History:  Diagnosis Date  . GERD (gastroesophageal reflux disease)   . HTN (hypertension)   . Hyperlipidemia   . TIA (transient ischemic attack)     Current Outpatient Medications:  .  acetaminophen (TYLENOL) 500 MG tablet, Take 500 mg by mouth every 6 (six) hours as needed for moderate pain or headache. , Disp: , Rfl:  .  amLODipine (NORVASC) 5 MG tablet, Take 1 tablet (5 mg total) by mouth daily., Disp: 30 tablet, Rfl: 6 .  Cholecalciferol (VITAMIN D3) 5000 units CAPS, Take 5,000 Units by mouth at bedtime., Disp: , Rfl:  .  dofetilide (TIKOSYN) 500 MCG capsule, Take 1 capsule (500 mcg total) by mouth 2 (two) times daily., Disp: 60 capsule, Rfl: 6 .  DULoxetine (CYMBALTA) 30 MG capsule, Take 1 capsule (30 mg total) by mouth daily., Disp: 30 capsule, Rfl: 1 .  ezetimibe (ZETIA) 10 MG tablet, Take 10 mg by mouth daily. , Disp: , Rfl:  .  famotidine (PEPCID) 20 MG tablet, Take 20 mg by mouth 2 (two) times daily. , Disp: , Rfl:  .  furosemide (LASIX) 20 MG tablet, Take 1 tablet (20 mg total) by mouth daily for 210 doses., Disp: 30 tablet, Rfl: 6 .  loratadine (CLARITIN) 10 MG tablet, Take 1  tablet (10 mg total) by mouth daily., Disp: 30 tablet, Rfl: 11 .  Potassium Chloride ER 20 MEQ TBCR, Take 20 mEq by mouth 2 (two) times daily., Disp: 180 tablet, Rfl: 3 .  XARELTO 20 MG TABS tablet, TAKE 1 TABLET (20 MG TOTAL) BY MOUTH DAILY WITH SUPPER., Disp: 90 tablet, Rfl: 01  Depression screen [facility] 2/9 04/18/2019 03/14/2019  Decreased Interest 1 1  Down, Depressed, Hopeless 1 1  PHQ - 2 Score 2 2  Altered sleeping 1 3  Tired, decreased energy 1 3  Change in appetite 1 3  Feeling  bad or failure about yourself  1 1  Trouble concentrating 1 3  Moving slowly or fidgety/restless 1 1  Suicidal thoughts 0 0  PHQ-9 Score 8 16   GAD 7 : Generalized Anxiety Score 04/18/2019 03/14/2019  Nervous, Anxious, on Edge 1 3  Control/stop worrying 1 3  Worry too much - different things 1 3  Trouble relaxing 1 3  Restless 1 1  Easily annoyed or irritable 0 1  Afraid - awful might happen 1 3  Total GAD 7 Score 6 17    Assessment/ Plan: 73 y.o. female   1. Depression, major, single episode, mild ([place]) She has had quite a bit of improvement in both anxiety and depressive symptoms with the Cymbalta.  She would like to stay on the 30 mg for now and we have refilled this for 90 days.  Her follow-up appointment has been scheduled for August.  I encouraged her to contact me if symptoms become worse for any reason and we can increase the dose to 60 mg.  She was good understanding will follow-up as scheduled - DULoxetine (CYMBALTA) 30 MG capsule; Take 1 capsule (30 mg total) by mouth daily.  Dispense: 90 capsule; Refill: 0  2. Generalized anxiety disorder As above - DULoxetine (CYMBALTA) 30 MG capsule; Take 1 capsule (30 mg total) by mouth daily.  Dispense: 90 capsule; Refill: 0  3. Hypokalemia Having some difficulty swallowing the potassium pills.  I have instructed her to take the potassium and dissolve a single tablet in 4 ounces of water and drink.   Start time: 9:28am End time: 9:42am  Total time spent on patient care (including telephone call/ virtual visit): 19 minutes  Nadine, [place] [phone]

## 2019-04-24 ENCOUNTER — Ambulatory Visit (INDEPENDENT_AMBULATORY_CARE_PROVIDER_SITE_OTHER): Payer: Medicare Other | Admitting: *Deleted

## 2019-04-24 ENCOUNTER — Other Ambulatory Visit: Payer: Self-pay

## 2019-04-24 VITALS — Ht 66.0 in | Wt 263.0 lb

## 2019-04-24 DIAGNOSIS — Z0000 Encounter for general adult medical examination without abnormal findings: Secondary | ICD-10-CM

## 2019-04-24 NOTE — Patient Instructions (Signed)
[name] Collier , Thank you for taking time to come for your Medicare Wellness Visit. I appreciate your ongoing commitment to your health goals. Please review the following plan we discussed and let me know if I can assist you in the future.   These are the goals we discussed: Goals    . Client will verbalize understanding of resources for managing BMI        This is a list of the screening recommended for you and due dates:  Health Maintenance  Topic Date Due  .  Hepatitis C: One time screening is recommended by Center for Disease Control  (CDC) for  adults born from [birth year] through 1965.   February 23, 1946  . Tetanus Vaccine  08/09/1965  . Flu Shot  07/08/2019  . Colon Cancer Screening  04/15/2020  . Mammogram  08/07/2020  . DEXA scan (bone density measurement)  Completed  . Pneumonia vaccines  Completed     BMI for Adults  Body mass index (BMI) is a number that is calculated from a person's weight and height. BMI may help to estimate how much of a person's weight is composed of fat. BMI can help identify those who may be at higher risk for certain medical problems. How is BMI used with adults? BMI is used as a screening tool to identify possible weight problems. It is used to check whether a person is obese, overweight, healthy weight, or underweight. How is BMI calculated? BMI measures your weight and compares it to your height. This can be done either in [place] ([place] or metric measurements. Note that charts are available to help you find your BMI quickly and easily without having to do these calculations yourself. To calculate your BMI in English ([place] measurements, your health care provider will: 1. Measure your weight in pounds (lb). 2. Multiply the number of pounds by 703. ? For example, for a person who weighs 180 lb, multiply that number by 703, which equals 126,540. 3. Measure your height in inches (in). Then multiply that number by itself to get a measurement called "inches  squared." ? For example, for a person who is 70 in tall, the "inches squared" measurement is 70 in x 70 in, which equals 4900 inches squared. 4. Divide the total from Step 2 (number of lb x 703) by the total from Step 3 (inches squared): 126,540  4900 = 25.8. This is your BMI. To calculate your BMI in metric measurements, your health care provider will: 1. Measure your weight in kilograms (kg). 2. Measure your height in meters (m). Then multiply that number by itself to get a measurement called "meters squared." ? For example, for a person who is 1.75 m tall, the "meters squared" measurement is 1.75 m x 1.75 m, which is equal to 3.1 meters squared. 3. Divide the number of kilograms (your weight) by the meters squared number. In this example: 70  3.1 = 22.6. This is your BMI. How is BMI interpreted? To interpret your results, your health care provider will use BMI charts to identify whether you are underweight, normal weight, overweight, or obese. The following guidelines will be used:  Underweight: BMI less than 18.5.  Normal weight: BMI between 18.5 and 24.9.  Overweight: BMI between 25 and 29.9.  Obese: BMI of 30 and above. Please note:  Weight includes both fat and muscle, so someone with a muscular build, such as an athlete, may have a BMI that is higher than 24.9. In cases like these, BMI  is not an accurate measure of body fat.  To determine if excess body fat is the cause of a BMI of 25 or higher, further assessments may need to be done by a health care provider.  BMI is usually interpreted in the same way for men and women. Why is BMI a useful tool? BMI is useful in two ways:  Identifying a weight problem that may be related to a medical condition, or that may increase the risk for medical problems.  Promoting lifestyle and diet changes in order to reach a healthy weight. Summary  Body mass index (BMI) is a number that is calculated from a person's weight and height.  BMI  may help to estimate how much of a person's weight is composed of fat. BMI can help identify those who may be at higher risk for certain medical problems.  BMI can be measured using English measurements or metric measurements.  To interpret your results, your health care provider will use BMI charts to identify whether you are underweight, normal weight, overweight, or obese. This information is not intended to replace advice given to you by your health care provider. Make sure you discuss any questions you have with your health care provider. Document Released: 08/04/2004 Document Revised: 10/06/2017 Document Reviewed: 10/06/2017 Elsevier Interactive Patient Education  2019 [employer].

## 2019-04-24 NOTE — Progress Notes (Signed)
MEDICARE ANNUAL WELLNESS VISIT  04/24/2019  Telephone Visit Disclaimer This Medicare AWV was conducted by telephone due to national recommendations for restrictions regarding the COVID-19 Pandemic (e.g. social distancing).  I verified, using two identifiers, that I am speaking with [name] Collier or their authorized healthcare agent. I discussed the limitations, risks, security, and privacy concerns of performing an evaluation and management service by telephone and the potential availability of an in-person appointment in the future. The patient expressed understanding and agreed to proceed.   Subjective:  [name] Collier is a 73 y.o. female patient of [name], DO who had a Medicare Annual Wellness Visit today via telephone. [name] Collier is Retired and lives alone. she has 2 children. she reports that she is socially active and does interact with friends/family regularly. she is minimally physically active and enjoys sewing.  Patient Care Team: [name], DO as PCP - General (Family Medicine) Minus Breeding, MD as PCP - Cardiology (Cardiology) Thompson Grayer, MD as PCP - Electrophysiology (Cardiology)  Advanced Directives 04/24/2019 12/22/2018 12/20/2018 10/25/2018 09/20/2018 05/06/2018 01/04/2018  Does Patient Have a Medical Advance Directive? Yes Yes Yes Yes Yes Yes Yes  Type of Advance Directive Living will;Healthcare Power of Attorney Living will;Healthcare Power of Attorney Living will;Healthcare Power of Attorney Living will;Healthcare Power of [place];Living will [place];Living will [place];Living will  Does patient want to make changes to medical advance directive? No - Patient declined No - Patient declined No - Patient declined No - Patient declined - No - Patient declined -  Copy of [place] in Chart? No - copy requested No - copy requested No - copy requested No - copy requested  Yes Yes No - copy requested  Would patient like information on creating a medical advance directive? - - - - - [facility] Utilization Over the Past 12 Months: # of hospitalizations or ER visits: 1 # of surgeries: 0  Review of Systems    Patient reports that her overall health is unchanged compared to last year.  Patient Reported Readings (BP, Pulse, CBG, Weight, etc) none  Review of Systems: History obtained from chart review and the patient General ROS: negative  All other systems negative.  Pain Assessment Pain : No/denies pain     Current Medications & Allergies (verified) Allergies as of 04/24/2019      Reactions   Augmentin [amoxicillin-pot Clavulanate] Other (See Comments)   c-diff Has patient had a PCN reaction causing immediate rash, facial/tongue/throat swelling, SOB or lightheadedness with hypotension: No Has patient had a PCN reaction causing severe rash involving mucus membranes or skin necrosis: No Has patient had a PCN reaction that required hospitalization: No Has patient had a PCN reaction occurring within the last 10 years: Yes If all of the above answers are "NO", then may proceed with Cephalosporin use.   Codeine Palpitations, Rash   Prednisone Other (See Comments)   Jittery, red in the face      Medication List       Accurate as of Apr 24, 2019  2:11 PM. If you have any questions, ask your nurse or doctor.        acetaminophen 500 MG tablet Commonly known as:  TYLENOL Take 500 mg by mouth every 6 (six) hours as needed for moderate pain or headache.   amLODipine 5 MG tablet Commonly known as:  NORVASC Take 1 tablet (5  mg total) by mouth daily.   dofetilide 500 MCG capsule Commonly known as:  TIKOSYN Take 1 capsule (500 mcg total) by mouth 2 (two) times daily.   DULoxetine 30 MG capsule Commonly known as:  Cymbalta Take 1 capsule (30 mg total) by mouth daily.   famotidine 20 MG tablet Commonly known as:  PEPCID Take 20 mg by mouth  2 (two) times daily.   furosemide 20 MG tablet Commonly known as:  LASIX Take 1 tablet (20 mg total) by mouth daily for 210 doses.   loratadine 10 MG tablet Commonly known as:  CLARITIN Take 1 tablet (10 mg total) by mouth daily.   Potassium Chloride ER 20 MEQ Tbcr Take 20 mEq by mouth 2 (two) times daily.   Vitamin D3 125 MCG (5000 UT) Caps Take 5,000 Units by mouth at bedtime.   Xarelto 20 MG Tabs tablet Generic drug:  rivaroxaban TAKE 1 TABLET (20 MG TOTAL) BY MOUTH DAILY WITH SUPPER.   Zetia 10 MG tablet Generic drug:  ezetimibe Take 10 mg by mouth daily.       History (reviewed): Past Medical History:  Diagnosis Date  . GERD (gastroesophageal reflux disease)   . HTN (hypertension)   . Hyperlipidemia   . TIA (transient ischemic attack)    Past Surgical History:  Procedure Laterality Date  . BOTOX INJECTION N/A 12/22/2018   Procedure: BOTOX INJECTION INTO ANAL SPHINCTER;  Surgeon: Ileana Roup, MD;  Location: [facility];  Service: General;  Laterality: N/A;  . CARDIOVERSION N/A 01/04/2018   Procedure: CARDIOVERSION;  Surgeon: Dorothy Spark, MD;  Location[facility] ENDOSCOPY;  Service: Cardiovascular;  Laterality: N/A;  . CARDIOVERSION N/A 09/20/2018   Procedure: CARDIOVERSION;  Surgeon: Sueanne Margarita, MD;  Location: [facility] ENDOSCOPY;  Service: Cardiovascular;  Laterality: N/A;  . CARDIOVERSION N/A 10/27/2018   Procedure: CARDIOVERSION;  Surgeon: Buford Dresser, MD;  Location: [facility] ENDOSCOPY;  Service: Cardiovascular;  Laterality: N/A;  . KNEE SURGERY Bilateral    Torn miniscus  . RECTAL EXAM UNDER ANESTHESIA N/A 12/22/2018   Procedure: ANORECTAL  EXAM UNDER ANESTHESIA;  Surgeon: Ileana Roup, MD;  Location: [facility] ORS;  Service: General;  Laterality: N/A;  . [place]    . TONSILLECTOMY    . VAGINAL HYSTERECTOMY     Family History  Problem Relation Age of Onset  . Hypertension Mother   . Hyperlipidemia Mother   . Neurologic Disorder Mother 41        GB  . Stroke Son   . Stroke Maternal Uncle   . Stroke Grandchild   . Prostate cancer Brother    Social History   Socioeconomic History  . Marital status: Widowed    Spouse name: Not on file  . Number of children: 2  . Years of education: 48  . Highest education level: Not on file  Occupational History  . Occupation: Retired    Comment: Chief of Staff  . Financial resource strain: Not hard at all  . Food insecurity:    Worry: Never true    Inability: Never true  . Transportation needs:    Medical: No    Non-medical: No  Tobacco Use  . Smoking status: Never Smoker  . Smokeless tobacco: Never Used  Substance and Sexual Activity  . Alcohol use: No    Alcohol/week: 0.0 standard drinks  . Drug use: No  . Sexual activity: Not Currently  Lifestyle  . Physical activity:    Days per week: 3  days    Minutes per session: 30 min  . Stress: Not at all  Relationships  . Social connections:    Talks on phone: More than three times a week    Gets together: More than three times a week    Attends religious service: More than 4 times per year    Active member of club or organization: Yes    Attends meetings of clubs or organizations: More than 4 times per year    Relationship status: Widowed  Other Topics Concern  . Not on file  Social History Narrative   Lives alone   Caffeine use: Soda/tea daily    Activities of Daily Living In your present state of health, do you have any difficulty performing the following activities: 04/24/2019 12/20/2018  Hearing? N N  Vision? N N  Difficulty concentrating or making decisions? N N  Walking or climbing stairs? N N  Dressing or bathing? N N  Doing errands, shopping? N N  Preparing Food and eating ? N -  Using the Toilet? N -  In the past six months, have you accidently leaked urine? N -  Do you have problems with loss of bowel control? N -  Managing your Medications? N -  Managing your Finances? N -  Some recent  data might be hidden    Patient Literacy How often do you need to have someone help you when you read instructions, pamphlets, or other written materials from your doctor or pharmacy?: 1 - Never What is the last grade level you completed in school?: 12th Grade  Exercise Current Exercise Habits: Home exercise routine, Type of exercise: walking, Time (Minutes): 30, Frequency (Times/Week): 3, Weekly Exercise (Minutes/Week): 90, Intensity: Mild  Diet Patient reports consuming 3 meals a day and 1 snack(s) a day Patient reports that her primary diet is: Regular Patient reports that she does have regular access to food.   Depression Screen PHQ 2/9 Scores 04/24/2019 04/18/2019 03/14/2019  PHQ - 2 Score 2 2 2   PHQ- 9 Score 8 8 16      Fall Risk Fall Risk  04/24/2019 03/14/2019 08/04/2016  Falls in the past year? 0 0 No     Objective:  [name] Collier seemed alert and oriented and she participated appropriately during our telephone visit.  Blood Pressure Weight BMI  BP Readings from Last 3 Encounters:  03/14/19 (!) 147/67  02/22/19 134/82  12/22/18 134/64   Wt Readings from Last 3 Encounters:  04/24/19 263 lb (119.3 kg)  03/14/19 263 lb (119.3 kg)  02/22/19 261 lb (118.4 kg)   BMI Readings from Last 1 Encounters:  04/24/19 42.45 kg/m    *Unable to obtain current vital signs, weight, and BMI due to telephone visit type  Hearing/Vision  . Daisja did not seem to have difficulty with hearing/understanding during the telephone conversation . Reports that she has had a formal eye exam by an eye care professional within the past year . Reports that she has not had a formal hearing evaluation within the past year *Unable to fully assess hearing and vision during telephone visit type  Cognitive Function: 6CIT Screen 04/24/2019  What Year? 0 points  What month? 0 points  What time? 0 points  Count back from 20 0 points  Months in reverse 0 points  Repeat phrase 0 points  Total Score 0     Normal Cognitive Function Screening: Yes (Normal:0-7, Significant for Dysfunction: >8)  Immunization & Health Maintenance Record Immunization History  Administered Date(s) Administered  . Pneumococcal Conjugate-13 12/30/2016  . Pneumococcal Polysaccharide-23 02/08/2018    Health Maintenance  Topic Date Due  . Hepatitis C Screening  September 05, 1946  . TETANUS/TDAP  08/09/1965  . INFLUENZA VACCINE  07/08/2019  . COLONOSCOPY  04/15/2020  . MAMMOGRAM  08/07/2020  . DEXA SCAN  Completed  . PNA vac Low Risk Adult  Completed       Assessment  This is a routine wellness examination for [name] Collier.  Health Maintenance: Due or Overdue Health Maintenance Due  Topic Date Due  . Hepatitis C Screening  02-22-1946  . TETANUS/TDAP  08/09/1965    [name] Collier does not need a referral for Community Assistance: Care Management:   no Social Work:    no Prescription Assistance:  no Nutrition/Diabetes Education:  no   Plan:  Personalized Goals Goals Addressed            This Visit's Progress   . Client will verbalize understanding of resources for managing BMI         Personalized Health Maintenance & Screening Recommendations  Td vaccine  Lung Cancer Screening Recommended: no (Low Dose CT Chest recommended if Age 11-80 years, 30 pack-year currently smoking OR have quit w/in past 15 years) Hepatitis C Screening recommended: yes HIV Screening recommended: no  Advanced Directives: Written information was not prepared per patient's request.  Referrals & Orders No orders of the defined types were placed in this encounter.   Follow-up Plan . Follow-up with [name], DO as planned   I have personally reviewed and noted the following in the patient's chart:   . Medical and social history . Use of alcohol, tobacco or illicit drugs  . Current medications and supplements . Functional ability and status . Nutritional status . Physical activity . Advanced  directives . List of other physicians . Hospitalizations, surgeries, and ER visits in previous 12 months . Vitals . Screenings to include cognitive, depression, and falls . Referrals and appointments  In addition, I have reviewed and discussed with [name] Collier certain preventive protocols, quality metrics, and best practice recommendations. A written personalized care plan for preventive services as well as general preventive health recommendations is available and can be mailed to the patient at her request.      Truett Mainland, LPN  signature  [id number]

## 2019-05-05 ENCOUNTER — Other Ambulatory Visit: Payer: Self-pay | Admitting: Cardiology

## 2019-05-05 ENCOUNTER — Other Ambulatory Visit: Payer: Self-pay | Admitting: Physician Assistant

## 2019-05-25 ENCOUNTER — Encounter (HOSPITAL_COMMUNITY): Payer: Self-pay | Admitting: Physician Assistant

## 2019-05-25 ENCOUNTER — Ambulatory Visit (HOSPITAL_COMMUNITY)
Admission: RE | Admit: 2019-05-25 | Discharge: 2019-05-25 | Disposition: A | Discharge status: Home / Self Care | Payer: Medicare Other | Source: Ambulatory Visit | Attending: Physician Assistant | Admitting: Physician Assistant

## 2019-05-25 ENCOUNTER — Other Ambulatory Visit: Payer: Self-pay

## 2019-05-25 VITALS — BP 144/84 | HR 70 | Ht 66.0 in | Wt 270.6 lb

## 2019-05-25 DIAGNOSIS — Z888 Allergy status to other drugs, medicaments and biological substances status: Secondary | ICD-10-CM | POA: Insufficient documentation

## 2019-05-25 DIAGNOSIS — Z8673 Personal history of transient ischemic attack (TIA), and cerebral infarction without residual deficits: Secondary | ICD-10-CM | POA: Diagnosis not present

## 2019-05-25 DIAGNOSIS — Z885 Allergy status to narcotic agent status: Secondary | ICD-10-CM | POA: Insufficient documentation

## 2019-05-25 DIAGNOSIS — Z79899 Other long term (current) drug therapy: Secondary | ICD-10-CM | POA: Diagnosis not present

## 2019-05-25 DIAGNOSIS — I10 Essential (primary) hypertension: Secondary | ICD-10-CM | POA: Diagnosis not present

## 2019-05-25 DIAGNOSIS — I4819 Other persistent atrial fibrillation: Secondary | ICD-10-CM | POA: Insufficient documentation

## 2019-05-25 DIAGNOSIS — Z823 Family history of stroke: Secondary | ICD-10-CM | POA: Diagnosis not present

## 2019-05-25 DIAGNOSIS — Z880 Allergy status to penicillin: Secondary | ICD-10-CM | POA: Insufficient documentation

## 2019-05-25 DIAGNOSIS — E785 Hyperlipidemia, unspecified: Secondary | ICD-10-CM | POA: Diagnosis not present

## 2019-05-25 DIAGNOSIS — K219 Gastro-esophageal reflux disease without esophagitis: Secondary | ICD-10-CM | POA: Diagnosis not present

## 2019-05-25 DIAGNOSIS — I4891 Unspecified atrial fibrillation: Secondary | ICD-10-CM | POA: Diagnosis present

## 2019-05-25 DIAGNOSIS — E669 Obesity, unspecified: Secondary | ICD-10-CM | POA: Insufficient documentation

## 2019-05-25 DIAGNOSIS — Z8249 Family history of ischemic heart disease and other diseases of the circulatory system: Secondary | ICD-10-CM | POA: Diagnosis not present

## 2019-05-25 DIAGNOSIS — Z6841 Body Mass Index (BMI) 40.0 and over, adult: Secondary | ICD-10-CM | POA: Insufficient documentation

## 2019-05-25 LAB — BASIC METABOLIC PANEL
Anion gap: 8 (ref 5–15)
BUN: 13 mg/dL (ref 8–23)
CO2: 26 mmol/L (ref 22–32)
Calcium: 9.1 mg/dL (ref 8.9–10.3)
Chloride: 103 mmol/L (ref 98–111)
Creatinine, Ser: 0.64 mg/dL (ref 0.44–1.00)
GFR calc Af Amer: >60 mL/min (ref >60)
GFR calc non Af Amer: >60 mL/min (ref >60)
Glucose, Bld: 95 mg/dL (ref 70–99)
Potassium: 3.9 mmol/L (ref 3.5–5.1)
Sodium: 137 mmol/L (ref 135–145)

## 2019-05-25 LAB — MAGNESIUM: Magnesium: 1.9 mg/dL (ref 1.7–2.4)

## 2019-05-25 NOTE — Progress Notes (Signed)
Primary Care Physician: [name], DO Referring Physician: Dr. Radford Pax Cardiologist: Dr. Percival Spanish EP: Dr. [name] Hearing is a 73 y.o. female with a h/o afib, HTN, hyperlipidemia and TIA. SHe had a cardioversion in January of 2018 and it held until recently when she was found to be back in afib. This was associated with increase in LLE and shortness of breath/fatigue. She was scheduled for cardioversion which was unsuccessful. She  had a sleep study and was not found to have any sleep apnea. No alcohol use. No tobacco or excessive caffeine. She is obese and has chronic  issues with LLE, improved with lasix.  Since last being seen in our clinic, patient reports doing very well with no symptoms of heart racing or palpitations. Tolerating medications well. Has not had to take her PRN lasix very often.   Today, she denies symptoms of palpitations, chest pain, shortness of breath, orthopnea, PND, lower extremity edema, dizziness, presyncope, syncope, or neurologic sequela. The patient is tolerating medications without difficulties and is otherwise without complaint today.   Past Medical History:  Diagnosis Date  . GERD (gastroesophageal reflux disease)   . HTN (hypertension)   . Hyperlipidemia   . TIA (transient ischemic attack)    Past Surgical History:  Procedure Laterality Date  . BOTOX INJECTION N/A 12/22/2018   Procedure: BOTOX INJECTION INTO ANAL SPHINCTER;  Surgeon: Ileana Roup, MD;  Location: [facility]S;  Service: General;  Laterality: N/A;  . CARDIOVERSION N/A 01/04/2018   Procedure: CARDIOVERSION;  Surgeon: Dorothy Spark, MD;  Location: [facility] ENDOSCOPY;  Service: Cardiovascular;  Laterality: N/A;  . CARDIOVERSION N/A 09/20/2018   Procedure: CARDIOVERSION;  Surgeon: Sueanne Margarita, MD;  Location: [facility] ENDOSCOPY;  Service: Cardiovascular;  Laterality: N/A;  . CARDIOVERSION N/A 10/27/2018   Procedure: CARDIOVERSION;  Surgeon: Buford Dresser, MD;   Location: [facility] ENDOSCOPY;  Service: Cardiovascular;  Laterality: N/A;  . KNEE SURGERY Bilateral    Torn miniscus  . RECTAL EXAM UNDER ANESTHESIA N/A 12/22/2018   Procedure: ANORECTAL  EXAM UNDER ANESTHESIA;  Surgeon: Ileana Roup, MD;  Location: [facility] ORS;  Service: General;  Laterality: N/A;  . [place]    . TONSILLECTOMY    . VAGINAL HYSTERECTOMY      Current Outpatient Medications  Medication Sig Dispense Refill  . acetaminophen (TYLENOL) 500 MG tablet Take 500 mg by mouth every 6 (six) hours as needed for moderate pain or headache.     Marland Kitchen amLODipine (NORVASC) 5 MG tablet Take 1 tablet (5 mg total) by mouth daily. 30 tablet 6  . Cholecalciferol (VITAMIN D3) 5000 units CAPS Take 5,000 Units by mouth at bedtime.    . dofetilide (TIKOSYN) 500 MCG capsule TAKE 1 CAPSULE (500 MCG TOTAL) BY MOUTH 2 (TWO) TIMES DAILY. 60 capsule 6  . DULoxetine (CYMBALTA) 30 MG capsule Take 1 capsule (30 mg total) by mouth daily. 90 capsule 0  . ezetimibe (ZETIA) 10 MG tablet Take 10 mg by mouth daily.     . famotidine (PEPCID) 20 MG tablet Take 20 mg by mouth 2 (two) times daily.     . furosemide (LASIX) 20 MG tablet Take 1 tablet (20 mg total) by mouth daily for 210 doses. 30 tablet 6  . loratadine (CLARITIN) 10 MG tablet Take 1 tablet (10 mg total) by mouth daily. 30 tablet 11  . Potassium Chloride ER 20 MEQ TBCR Take 20 mEq by mouth 2 (two) times daily. 180 tablet 3  .  XARELTO 20 MG TABS tablet TAKE 1 TABLET (20 MG TOTAL) BY MOUTH DAILY WITH SUPPER. 90 tablet 1   No current facility-administered medications for this encounter.     Allergies  Allergen Reactions  . Augmentin [Amoxicillin-Pot Clavulanate] Other (See Comments)    c-diff Has patient had a PCN reaction causing immediate rash, facial/tongue/throat swelling, SOB or lightheadedness with hypotension: No Has patient had a PCN reaction causing severe rash involving mucus membranes or skin necrosis: No Has patient had a PCN reaction that  required hospitalization: No Has patient had a PCN reaction occurring within the last 10 years: Yes If all of the above answers are "NO", then may proceed with Cephalosporin use.   . Codeine Palpitations and Rash  . Prednisone Other (See Comments)    Jittery, red in the face    Social History   Socioeconomic History  . Marital status: Widowed    Spouse name: Not on file  . Number of children: 2  . Years of education: 76  . Highest education level: Not on file  Occupational History  . Occupation: Retired    Comment: Chief of Staff  . Financial resource strain: Not hard at all  . Food insecurity    Worry: Never true    Inability: Never true  . Transportation needs    Medical: No    Non-medical: No  Tobacco Use  . Smoking status: Never Smoker  . Smokeless tobacco: Never Used  Substance and Sexual Activity  . Alcohol use: No    Alcohol/week: 0.0 standard drinks  . Drug use: No  . Sexual activity: Not Currently  Lifestyle  . Physical activity    Days per week: 3 days    Minutes per session: 30 min  . Stress: Not at all  Relationships  . Social connections    Talks on phone: More than three times a week    Gets together: More than three times a week    Attends religious service: More than 4 times per year    Active member of club or organization: Yes    Attends meetings of clubs or organizations: More than 4 times per year    Relationship status: Widowed  . Intimate partner violence    Fear of current or ex partner: No    Emotionally abused: No    Physically abused: No    Forced sexual activity: No  Other Topics Concern  . Not on file  Social History Narrative   Lives alone   Caffeine use: Soda/tea daily    Family History  Problem Relation Age of Onset  . Hypertension Mother   . Hyperlipidemia Mother   . Neurologic Disorder Mother 45       GB  . Stroke Son   . Stroke Maternal Uncle   . Stroke Grandchild   . Prostate cancer Brother      ROS- All systems are reviewed and negative except as per the HPI above  Physical Exam: Vitals:   05/25/19 1140  BP: (!) 144/84  Pulse: 70  Weight: 122.7 kg  Height: 5\' 6"  (1.676 m)   Wt Readings from Last 3 Encounters:  05/25/19 122.7 kg  04/24/19 119.3 kg  03/14/19 119.3 kg    Labs: Lab Results  Component Value Date   NA 142 03/14/2019   K 4.4 03/14/2019   CL 105 03/14/2019   CO2 24 03/14/2019   GLUCOSE 97 03/14/2019   BUN 11 03/14/2019   CREATININE 0.71 03/14/2019  CALCIUM 9.6 03/14/2019   MG 2.0 03/14/2019   Lab Results  Component Value Date   INR 1.10 02/26/2016   Lab Results  Component Value Date   CHOL 202 (H) 02/27/2016   HDL 44 02/27/2016   LDLCALC 139 (H) 02/27/2016   TRIG 97 02/27/2016    GEN- The patient is well appearing obese female, alert and oriented x 3 today.   HEENT-head normocephalic, atraumatic, sclera clear, conjunctiva pink, hearing intact, trachea midline. Lungs- Clear to ausculation bilaterally, normal work of breathing Heart- Regular rate and rhythm, no murmurs, rubs or gallops  GI- soft, NT, ND, + BS Extremities- no clubbing, cyanosis, or edema MS- no significant deformity or atrophy Skin- no rash or lesion Psych- euthymic mood, full affect Neuro- strength and sensation are intact   EKG- SR HR 70, slow R wave prog, PR 186, QRS 84, QTc 455  Echo-Study Conclusions  - Left ventricle: The cavity size was normal. There was mild   concentric hypertrophy. Systolic function was normal. The   estimated ejection fraction was in the range of 60% to 65%. Wall   motion was normal; there were no regional wall motion   abnormalities. - Aortic valve: Transvalvular velocity was within the normal range.   There was no stenosis. There was no regurgitation. - Aorta: Ascending aortic diameter: 38 mm (S). - Ascending aorta: The ascending aorta was mildly dilated. - Mitral valve: Mildly calcified annulus. Transvalvular velocity   was within the  normal range. There was no evidence for stenosis.   There was mild regurgitation. - Left atrium: The atrium was mildly dilated.LAD 57 mm, volume 78 ml - Right ventricle: The cavity size was normal. Wall thickness was   normal. Systolic function was normal. - Atrial septum: No defect or patent foramen ovale was identified. - Tricuspid valve: There was mild-moderate regurgitation. - Pulmonic valve: There was moderate regurgitation. - Pulmonary arteries: Systolic pressure was severely increased. PA   peak pressure: 69 mm Hg (S).   Assessment and Plan: 1. Persistent atrial fibrillation Maintaining  SR on Tikosyn s/p successful cardioversion. No further episodes of heart racing. Continue Tikosyn 500 mcg BID. QT stable. Continue Xarelto 20 mg daily.  Bmet/mag today   This patients CHA2DS2-VASc Score and unadjusted Ischemic Stroke Rate (% per year) is equal to 7.2 % stroke rate/year from a score of 5  Above score calculated as 1 point each if present [CHF, HTN, DM, Vascular=MI/PAD/Aortic Plaque, Age if 65-74, or Female] Above score calculated as 2 points each if present [Age > 75, or Stroke/TIA/TE]  2. HTN Stable, no changes today.  3. Obesity  Body mass index is 43.68 kg/m. Lifestyle modification was discussed and encouraged including regular physical activity and weight reduction.   F/u in AF clinic in 3 months.  [facility] [address] [place], [place] [address] [phone]

## 2019-06-02 ENCOUNTER — Other Ambulatory Visit: Payer: Self-pay | Admitting: Physician Assistant

## 2019-06-28 DIAGNOSIS — M65312 Trigger thumb, left thumb: Secondary | ICD-10-CM | POA: Insufficient documentation

## 2019-07-11 ENCOUNTER — Other Ambulatory Visit: Payer: Self-pay | Admitting: Family Medicine

## 2019-07-11 DIAGNOSIS — F411 Generalized anxiety disorder: Secondary | ICD-10-CM

## 2019-07-11 DIAGNOSIS — F320 Major depressive disorder, single episode, mild: Secondary | ICD-10-CM

## 2019-07-18 ENCOUNTER — Other Ambulatory Visit: Payer: Self-pay | Admitting: *Deleted

## 2019-07-18 MED ORDER — POTASSIUM CHLORIDE ER 20 MEQ PO TBCR: 20.0000 meq | EXTENDED_RELEASE_TABLET | Freq: Two times a day (BID) | ORAL | 1 refills | Status: DC

## 2019-07-18 MED ORDER — AMLODIPINE BESYLATE 5 MG PO TABS: 5.0000 mg | ORAL_TABLET | Freq: Every day | ORAL | 1 refills | Status: DC

## 2019-07-18 MED ORDER — FAMOTIDINE 20 MG PO TABS: 20.0000 mg | ORAL_TABLET | Freq: Two times a day (BID) | ORAL | 1 refills | Status: DC

## 2019-07-18 MED ORDER — FUROSEMIDE 20 MG PO TABS: 20.0000 mg | ORAL_TABLET | Freq: Every day | ORAL | 1 refills | Status: DC

## 2019-07-18 MED ORDER — EZETIMIBE 10 MG PO TABS: 10.0000 mg | ORAL_TABLET | Freq: Every day | ORAL | 1 refills | Status: DC

## 2019-07-20 ENCOUNTER — Other Ambulatory Visit: Payer: Self-pay | Admitting: *Deleted

## 2019-07-20 DIAGNOSIS — F411 Generalized anxiety disorder: Secondary | ICD-10-CM

## 2019-07-20 DIAGNOSIS — F320 Major depressive disorder, single episode, mild: Secondary | ICD-10-CM

## 2019-07-20 MED ORDER — DULOXETINE HCL 30 MG PO CPEP: ORAL_CAPSULE | ORAL | 0 refills | Status: DC

## 2019-07-21 ENCOUNTER — Ambulatory Visit (INDEPENDENT_AMBULATORY_CARE_PROVIDER_SITE_OTHER): Payer: Medicare Other | Admitting: Family Medicine

## 2019-07-21 DIAGNOSIS — F411 Generalized anxiety disorder: Secondary | ICD-10-CM | POA: Diagnosis not present

## 2019-07-21 DIAGNOSIS — R002 Palpitations: Secondary | ICD-10-CM | POA: Diagnosis not present

## 2019-07-21 DIAGNOSIS — F320 Major depressive disorder, single episode, mild: Secondary | ICD-10-CM

## 2019-07-21 MED ORDER — DULOXETINE HCL 30 MG PO CPEP: ORAL_CAPSULE | ORAL | 2 refills | Status: DC

## 2019-07-21 NOTE — Progress Notes (Signed)
Telephone visit  Subjective: CC: f/u depression/ anxiety PCP: [name], DO [name] Collier is a 73 y.o. female calls for telephone consult today. Patient provides verbal consent for consult held via phone.  Location of patient: Home Location of provider: Working remotely from home Others present for call: None  1. Depression/ anxiety Patient last evaluated on Apr 18, 2019.  During that visit she was reporting quite a bit of improvement in symptoms with Cymbalta 30 mg daily.  She reports that she has been doing well on this.  She has a slight increase anxiety because her granddaughter started driving again.  She is nervous about this.  She otherwise feels like things are going well given the circumstances.  She does report occasional heart palpitations but notes that when she checked her heart rate it was normal.  She reports compliance with her Tikosyn as prescribed by cardiology.  She also takes her potassium as directed.  She is not having heart palpitations, shortness of breath or chest pain now.  No change in exercise tolerance.   ROS: Per HPI  Allergies  Allergen Reactions  . Augmentin [Amoxicillin-Pot Clavulanate] Other (See Comments)    c-diff Has patient had a PCN reaction causing immediate rash, facial/tongue/throat swelling, SOB or lightheadedness with hypotension: No Has patient had a PCN reaction causing severe rash involving mucus membranes or skin necrosis: No Has patient had a PCN reaction that required hospitalization: No Has patient had a PCN reaction occurring within the last 10 years: Yes If all of the above answers are "NO", then may proceed with Cephalosporin use.   . Codeine Palpitations and Rash  . Prednisone Other (See Comments)    Jittery, red in the face   Past Medical History:  Diagnosis Date  . GERD (gastroesophageal reflux disease)   . HTN (hypertension)   . Hyperlipidemia   . TIA (transient ischemic attack)     Current Outpatient  Medications:  .  acetaminophen (TYLENOL) 500 MG tablet, Take 500 mg by mouth every 6 (six) hours as needed for moderate pain or headache. , Disp: , Rfl:  .  amLODipine (NORVASC) 5 MG tablet, Take 1 tablet (5 mg total) by mouth daily., Disp: 90 tablet, Rfl: 1 .  Cholecalciferol (VITAMIN D3) 5000 units CAPS, Take 5,000 Units by mouth at bedtime., Disp: , Rfl:  .  dofetilide (TIKOSYN) 500 MCG capsule, TAKE 1 CAPSULE (500 MCG TOTAL) BY MOUTH 2 (TWO) TIMES DAILY., Disp: 60 capsule, Rfl: 6 .  DULoxetine (CYMBALTA) 30 MG capsule, TAKE 1 CAPSULE BY MOUTH EVERY DAY, Disp: 90 capsule, Rfl: 0 .  ezetimibe (ZETIA) 10 MG tablet, Take 1 tablet (10 mg total) by mouth daily., Disp: 90 tablet, Rfl: 1 .  famotidine (PEPCID) 20 MG tablet, Take 1 tablet (20 mg total) by mouth 2 (two) times daily., Disp: 180 tablet, Rfl: 1 .  furosemide (LASIX) 20 MG tablet, Take 1 tablet (20 mg total) by mouth daily for 210 doses., Disp: 90 tablet, Rfl: 1 .  loratadine (CLARITIN) 10 MG tablet, Take 1 tablet (10 mg total) by mouth daily., Disp: 30 tablet, Rfl: 11 .  Potassium Chloride ER 20 MEQ TBCR, Take 20 mEq by mouth 2 (two) times daily., Disp: 180 tablet, Rfl: 1 .  XARELTO 20 MG TABS tablet, TAKE 1 TABLET (20 MG TOTAL) BY MOUTH DAILY WITH SUPPER., Disp: 90 tablet, Rfl: 1   Depression screen [facility] 2/9 07/21/2019 04/24/2019 04/18/2019  Decreased Interest 1 1 1   Down, Depressed, Hopeless 1  1 1  PHQ - 2 Score 2 2 2   Altered sleeping 1 1 1   Tired, decreased energy 1 1 1   Change in appetite 1 1 1   Feeling bad or failure about yourself  1 1 1   Trouble concentrating 1 1 1   Moving slowly or fidgety/restless 1 1 1   Suicidal thoughts 0 0 0  PHQ-9 Score 8 8 8   Difficult doing work/chores Somewhat difficult Somewhat difficult -   GAD 7 : Generalized Anxiety Score 07/21/2019 04/18/2019 03/14/2019  Nervous, Anxious, on Edge 1 1 3   Control/stop worrying 1 1 3   Worry too much - different things 1 1 3   Trouble relaxing 1 1 3   Restless 1 1 1    Easily annoyed or irritable 1 0 1  Afraid - awful might happen 1 1 3   Total GAD 7 Score 7 6 17   Anxiety Difficulty Somewhat difficult - -    Assessment/ Plan: 73 y.o. female   1. Depression, major, single episode, mild (HCC) Stable and doing well.  Does not wish to increase to 60 mg. - DULoxetine (CYMBALTA) 30 MG capsule; TAKE 1 CAPSULE BY MOUTH EVERY DAY  Dispense: 90 capsule; Refill: 2  2. Generalized anxiety disorder Slightly increased secondary to her granddaughter starting to drive.  This is otherwise stable. - DULoxetine (CYMBALTA) 30 MG capsule; TAKE 1 CAPSULE BY MOUTH EVERY DAY  Dispense: 90 capsule; Refill: 2  3. Fluttering sensation of heart I doubt A. fib with RVR given normal rate observed during episodes.  Given sure duration and absence of tachycardia suspect this is more likely to be PVCs.  Have asked her to keep a log should these recur.  Avoid caffeine, chocolate and make sure that she is hydrating adequately.  Continue current regimen.  She is going to obtain a device to check EKGs at home through her phone.    Start time: 8:15am End time: 8:33am  Total time spent on patient care (including telephone call/ virtual visit): 21 minutes  [place], [place] [phone]

## 2019-07-21 NOTE — Patient Instructions (Signed)

## 2019-08-23 DIAGNOSIS — Z0289 Encounter for other administrative examinations: Secondary | ICD-10-CM

## 2019-08-29 ENCOUNTER — Encounter (HOSPITAL_COMMUNITY): Payer: Self-pay | Admitting: Nurse Practitioner

## 2019-08-29 ENCOUNTER — Ambulatory Visit (HOSPITAL_COMMUNITY)
Admission: RE | Admit: 2019-08-29 | Discharge: 2019-08-29 | Disposition: A | Discharge status: Home / Self Care | Payer: Medicare Other | Source: Ambulatory Visit | Attending: Nurse Practitioner | Admitting: Nurse Practitioner

## 2019-08-29 ENCOUNTER — Other Ambulatory Visit: Payer: Self-pay

## 2019-08-29 VITALS — BP 136/76 | HR 64 | Ht 66.0 in | Wt 277.0 lb

## 2019-08-29 DIAGNOSIS — Z79899 Other long term (current) drug therapy: Secondary | ICD-10-CM | POA: Diagnosis not present

## 2019-08-29 DIAGNOSIS — Z8673 Personal history of transient ischemic attack (TIA), and cerebral infarction without residual deficits: Secondary | ICD-10-CM | POA: Diagnosis not present

## 2019-08-29 DIAGNOSIS — E785 Hyperlipidemia, unspecified: Secondary | ICD-10-CM | POA: Diagnosis not present

## 2019-08-29 DIAGNOSIS — I4819 Other persistent atrial fibrillation: Secondary | ICD-10-CM | POA: Diagnosis present

## 2019-08-29 DIAGNOSIS — I10 Essential (primary) hypertension: Secondary | ICD-10-CM | POA: Insufficient documentation

## 2019-08-29 DIAGNOSIS — Z7901 Long term (current) use of anticoagulants: Secondary | ICD-10-CM | POA: Insufficient documentation

## 2019-08-29 DIAGNOSIS — K219 Gastro-esophageal reflux disease without esophagitis: Secondary | ICD-10-CM | POA: Diagnosis not present

## 2019-08-29 LAB — BASIC METABOLIC PANEL
Anion gap: 11 (ref 5–15)
BUN: 8 mg/dL (ref 8–23)
CO2: 23 mmol/L (ref 22–32)
Calcium: 9.1 mg/dL (ref 8.9–10.3)
Chloride: 105 mmol/L (ref 98–111)
Creatinine, Ser: 0.64 mg/dL (ref 0.44–1.00)
GFR calc Af Amer: >60 mL/min (ref >60)
GFR calc non Af Amer: >60 mL/min (ref >60)
Glucose, Bld: 103 mg/dL — ABNORMAL HIGH (ref 70–99)
Potassium: 4.4 mmol/L (ref 3.5–5.1)
Sodium: 139 mmol/L (ref 135–145)

## 2019-08-29 LAB — MAGNESIUM: Magnesium: 2.1 mg/dL (ref 1.7–2.4)

## 2019-08-29 NOTE — Progress Notes (Signed)
Primary Care Physician: [name], DO Referring Physician: Dr. Radford Pax Cardiologist: Dr. Percival Spanish EP: Dr. [name] Hearing is a 73 y.o. female with a h/o afib, HTN, hyperlipidemia and TIA. She is in the afib clinic for f/u of Tikosyn use. She feels well without any evidence of afib. Continues on xarelto without any signs of bleeding. She has gained some weight with being less active with covid restrictions.  Today, she denies symptoms of palpitations, chest pain, shortness of breath, orthopnea, PND, lower extremity edema, dizziness, presyncope, syncope, or neurologic sequela. The patient is tolerating medications without difficulties and is otherwise without complaint today.   Past Medical History:  Diagnosis Date  . GERD (gastroesophageal reflux disease)   . HTN (hypertension)   . Hyperlipidemia   . TIA (transient ischemic attack)    Past Surgical History:  Procedure Laterality Date  . BOTOX INJECTION N/A 12/22/2018   Procedure: BOTOX INJECTION INTO ANAL SPHINCTER;  Surgeon: Ileana Roup, MD;  Location: [facility]S;  Service: General;  Laterality: N/A;  . CARDIOVERSION N/A 01/04/2018   Procedure: CARDIOVERSION;  Surgeon: Dorothy Spark, MD;  Location: [facility] ENDOSCOPY;  Service: Cardiovascular;  Laterality: N/A;  . CARDIOVERSION N/A 09/20/2018   Procedure: CARDIOVERSION;  Surgeon: Sueanne Margarita, MD;  Location: [facility] ENDOSCOPY;  Service: Cardiovascular;  Laterality: N/A;  . CARDIOVERSION N/A 10/27/2018   Procedure: CARDIOVERSION;  Surgeon: Buford Dresser, MD;  Location: [facility] ENDOSCOPY;  Service: Cardiovascular;  Laterality: N/A;  . KNEE SURGERY Bilateral    Torn miniscus  . RECTAL EXAM UNDER ANESTHESIA N/A 12/22/2018   Procedure: ANORECTAL  EXAM UNDER ANESTHESIA;  Surgeon: Ileana Roup, MD;  Location: [facility]S;  Service: General;  Laterality: N/A;  . [name]    . TONSILLECTOMY    . VAGINAL HYSTERECTOMY      Current Outpatient Medications   Medication Sig Dispense Refill  . acetaminophen (TYLENOL) 500 MG tablet Take 500 mg by mouth every 6 (six) hours as needed for moderate pain or headache.     Marland Kitchen amLODipine (NORVASC) 5 MG tablet Take 1 tablet (5 mg total) by mouth daily. 90 tablet 1  . Cholecalciferol (VITAMIN D3) 5000 units CAPS Take 5,000 Units by mouth at bedtime.    . dofetilide (TIKOSYN) 500 MCG capsule TAKE 1 CAPSULE (500 MCG TOTAL) BY MOUTH 2 (TWO) TIMES DAILY. 60 capsule 6  . DULoxetine (CYMBALTA) 30 MG capsule TAKE 1 CAPSULE BY MOUTH EVERY DAY 90 capsule 2  . ezetimibe (ZETIA) 10 MG tablet Take 1 tablet (10 mg total) by mouth daily. 90 tablet 1  . famotidine (PEPCID) 20 MG tablet Take 1 tablet (20 mg total) by mouth 2 (two) times daily. 180 tablet 1  . furosemide (LASIX) 20 MG tablet Take 1 tablet (20 mg total) by mouth daily for 210 doses. 90 tablet 1  . loratadine (CLARITIN) 10 MG tablet Take 1 tablet (10 mg total) by mouth daily. 30 tablet 11  . Potassium Chloride ER 20 MEQ TBCR Take 20 mEq by mouth 2 (two) times daily. 180 tablet 1  . XARELTO 20 MG TABS tablet TAKE 1 TABLET (20 MG TOTAL) BY MOUTH DAILY WITH SUPPER. 90 tablet 1   No current facility-administered medications for this encounter.     Allergies  Allergen Reactions  . Augmentin [Amoxicillin-Pot Clavulanate] Other (See Comments)    c-diff Has patient had a PCN reaction causing immediate rash, facial/tongue/throat swelling, SOB or lightheadedness with hypotension: No Has patient had a  PCN reaction causing severe rash involving mucus membranes or skin necrosis: No Has patient had a PCN reaction that required hospitalization: No Has patient had a PCN reaction occurring within the last 10 years: Yes If all of the above answers are "NO", then may proceed with Cephalosporin use.   . Codeine Palpitations and Rash  . Prednisone Other (See Comments)    Jittery, red in the face    Social History   Socioeconomic History  . Marital status: Widowed     Spouse name: Not on file  . Number of children: 2  . Years of education: 20  . Highest education level: Not on file  Occupational History  . Occupation: Retired    Comment: Chief of Staff  . Financial resource strain: Not hard at all  . Food insecurity    Worry: Never true    Inability: Never true  . Transportation needs    Medical: No    Non-medical: No  Tobacco Use  . Smoking status: Never Smoker  . Smokeless tobacco: Never Used  Substance and Sexual Activity  . Alcohol use: No    Alcohol/week: 0.0 standard drinks  . Drug use: No  . Sexual activity: Not Currently  Lifestyle  . Physical activity    Days per week: 3 days    Minutes per session: 30 min  . Stress: Not at all  Relationships  . Social connections    Talks on phone: More than three times a week    Gets together: More than three times a week    Attends religious service: More than 4 times per year    Active member of club or organization: Yes    Attends meetings of clubs or organizations: More than 4 times per year    Relationship status: Widowed  . Intimate partner violence    Fear of current or ex partner: No    Emotionally abused: No    Physically abused: No    Forced sexual activity: No  Other Topics Concern  . Not on file  Social History Narrative   Lives alone   Caffeine use: Soda/tea daily    Family History  Problem Relation Age of Onset  . Hypertension Mother   . Hyperlipidemia Mother   . Neurologic Disorder Mother 36       GB  . Stroke Son   . Stroke Maternal Uncle   . Stroke Grandchild   . Prostate cancer Brother     ROS- All systems are reviewed and negative except as per the HPI above  Physical Exam: Vitals:   08/29/19 1123  BP: 136/76  Pulse: 64  Weight: 125.6 kg  Height: 5\' 6"  (1.676 m)   Wt Readings from Last 3 Encounters:  08/29/19 125.6 kg  05/25/19 122.7 kg  04/24/19 119.3 kg    Labs: Lab Results  Component Value Date   NA 137 05/25/2019   K  3.9 05/25/2019   CL 103 05/25/2019   CO2 26 05/25/2019   GLUCOSE 95 05/25/2019   BUN 13 05/25/2019   CREATININE 0.64 05/25/2019   CALCIUM 9.1 05/25/2019   MG 1.9 05/25/2019   Lab Results  Component Value Date   INR 1.10 02/26/2016   Lab Results  Component Value Date   CHOL 202 (H) 02/27/2016   HDL 44 02/27/2016   LDLCALC 139 (H) 02/27/2016   TRIG 97 02/27/2016     GEN- The patient is well appearing, alert and oriented x 3 today.  Head- normocephalic, atraumatic Eyes-  Sclera clear, conjunctiva pink Ears- hearing intact Oropharynx- clear Neck- supple, no JVP Lymph- no cervical lymphadenopathy Lungs- Clear to ausculation bilaterally, normal work of breathing Heart- regular rate and rhythm, no murmurs, rubs or gallops, PMI not laterally displaced GI- soft, NT, ND, + BS Extremities- no clubbing, cyanosis, trace LLE MS- no significant deformity or atrophy Skin- no rash or lesion Psych- euthymic mood, full affect Neuro- strength and sensation are intact  EKG- SR at 64 bpm,  PR int 194 ms, qrs int 84 ms, qtc 437 ms Echo-Study Conclusions  - Left ventricle: The cavity size was normal. There was mild   concentric hypertrophy. Systolic function was normal. The   estimated ejection fraction was in the range of 60% to 65%. Wall   motion was normal; there were no regional wall motion   abnormalities. - Aortic valve: Transvalvular velocity was within the normal range.   There was no stenosis. There was no regurgitation. - Aorta: Ascending aortic diameter: 38 mm (S). - Ascending aorta: The ascending aorta was mildly dilated. - Mitral valve: Mildly calcified annulus. Transvalvular velocity   was within the normal range. There was no evidence for stenosis.   There was mild regurgitation. - Left atrium: The atrium was mildly dilated.LAD 57 mm, volume 78 ml - Right ventricle: The cavity size was normal. Wall thickness was   normal. Systolic function was normal. - Atrial septum:  No defect or patent foramen ovale was identified. - Tricuspid valve: There was mild-moderate regurgitation. - Pulmonic valve: There was moderate regurgitation. - Pulmonary arteries: Systolic pressure was severely increased. PA   peak pressure: 69 mm Hg (S).    Assessment and Plan: 1. Persistent afib Maintaining  SR on Tikosyn   qtc stable Continue xarelto 20 mg daily  Bmet/mag today   2. HTN Stable  F/u with Dr. Rayann Heman 12/18 afib clinic as needed  [name] Collier, [facility] [address] [place], [place] [address] [phone]

## 2019-10-30 DIAGNOSIS — Z1231 Encounter for screening mammogram for malignant neoplasm of breast: Secondary | ICD-10-CM | POA: Diagnosis not present

## 2019-10-30 DIAGNOSIS — Z1389 Encounter for screening for other disorder: Secondary | ICD-10-CM | POA: Diagnosis not present

## 2019-11-06 ENCOUNTER — Other Ambulatory Visit: Payer: Self-pay | Admitting: Cardiology

## 2019-11-09 ENCOUNTER — Telehealth (HOSPITAL_COMMUNITY): Payer: Self-pay | Admitting: *Deleted

## 2019-11-09 MED ORDER — METOPROLOL TARTRATE 25 MG PO TABS: 25.0000 mg | ORAL_TABLET | Freq: Two times a day (BID) | ORAL | 1 refills | Status: DC

## 2019-11-09 NOTE — Telephone Encounter (Signed)
Patient states for the last 2 weeks she has had increased shortness of breath and back in AF. She fell 2 weeks ago and was very stressed with the fall with issues getting up and noticed the change in status. Bp 178/85 HR 71. Discussed with Adline Peals PA will try adding metoprolol tartrate 25mg  BID back in and see if this will convert her. If still out of rhythm Monday to call in for appointment. Pt in agreement. Pt will stop metoprolol should she convert into NSR as she is usually bradycardiac in rhythm.

## 2019-11-14 NOTE — Telephone Encounter (Signed)
Patient feels like she returned to normal rhythm yesterday HR in the upper 50s. She is feeling much improved. She will continue metoprolol for now but will call back if HR stays in low 50s or develops lightheaded/dizziness. Pt in agreement.

## 2019-11-15 ENCOUNTER — Other Ambulatory Visit: Payer: Self-pay | Admitting: Family Medicine

## 2019-11-21 ENCOUNTER — Other Ambulatory Visit: Payer: Self-pay | Admitting: Physician Assistant

## 2019-11-27 ENCOUNTER — Other Ambulatory Visit: Payer: Self-pay | Admitting: Family Medicine

## 2019-11-28 ENCOUNTER — Ambulatory Visit (HOSPITAL_COMMUNITY)
Admission: RE | Admit: 2019-11-28 | Discharge: 2019-11-28 | Disposition: A | Discharge status: Home / Self Care | Payer: Medicare Other | Source: Ambulatory Visit | Attending: Nurse Practitioner | Admitting: Nurse Practitioner

## 2019-11-28 ENCOUNTER — Other Ambulatory Visit: Payer: Self-pay

## 2019-11-28 ENCOUNTER — Encounter (HOSPITAL_COMMUNITY): Payer: Self-pay | Admitting: Nurse Practitioner

## 2019-11-28 VITALS — BP 170/72 | HR 74 | Ht 66.0 in | Wt 282.4 lb

## 2019-11-28 DIAGNOSIS — K219 Gastro-esophageal reflux disease without esophagitis: Secondary | ICD-10-CM | POA: Insufficient documentation

## 2019-11-28 DIAGNOSIS — I10 Essential (primary) hypertension: Secondary | ICD-10-CM | POA: Diagnosis not present

## 2019-11-28 DIAGNOSIS — E785 Hyperlipidemia, unspecified: Secondary | ICD-10-CM | POA: Diagnosis not present

## 2019-11-28 DIAGNOSIS — Z79899 Other long term (current) drug therapy: Secondary | ICD-10-CM | POA: Diagnosis not present

## 2019-11-28 DIAGNOSIS — Z888 Allergy status to other drugs, medicaments and biological substances status: Secondary | ICD-10-CM | POA: Diagnosis not present

## 2019-11-28 DIAGNOSIS — Z880 Allergy status to penicillin: Secondary | ICD-10-CM | POA: Insufficient documentation

## 2019-11-28 DIAGNOSIS — Z885 Allergy status to narcotic agent status: Secondary | ICD-10-CM | POA: Diagnosis not present

## 2019-11-28 DIAGNOSIS — D6869 Other thrombophilia: Secondary | ICD-10-CM | POA: Diagnosis not present

## 2019-11-28 DIAGNOSIS — Z823 Family history of stroke: Secondary | ICD-10-CM | POA: Insufficient documentation

## 2019-11-28 DIAGNOSIS — Z7901 Long term (current) use of anticoagulants: Secondary | ICD-10-CM | POA: Insufficient documentation

## 2019-11-28 DIAGNOSIS — Z8249 Family history of ischemic heart disease and other diseases of the circulatory system: Secondary | ICD-10-CM | POA: Diagnosis not present

## 2019-11-28 DIAGNOSIS — Z8673 Personal history of transient ischemic attack (TIA), and cerebral infarction without residual deficits: Secondary | ICD-10-CM | POA: Diagnosis not present

## 2019-11-28 DIAGNOSIS — I4819 Other persistent atrial fibrillation: Secondary | ICD-10-CM | POA: Insufficient documentation

## 2019-11-28 DIAGNOSIS — I480 Paroxysmal atrial fibrillation: Secondary | ICD-10-CM

## 2019-11-28 DIAGNOSIS — I4891 Unspecified atrial fibrillation: Secondary | ICD-10-CM | POA: Diagnosis present

## 2019-11-28 LAB — BASIC METABOLIC PANEL
Anion gap: 9 (ref 5–15)
BUN: 14 mg/dL (ref 8–23)
CO2: 26 mmol/L (ref 22–32)
Calcium: 9 mg/dL (ref 8.9–10.3)
Chloride: 103 mmol/L (ref 98–111)
Creatinine, Ser: 0.69 mg/dL (ref 0.44–1.00)
GFR calc Af Amer: >60 mL/min (ref >60)
GFR calc non Af Amer: >60 mL/min (ref >60)
Glucose, Bld: 103 mg/dL — ABNORMAL HIGH (ref 70–99)
Potassium: 4.3 mmol/L (ref 3.5–5.1)
Sodium: 138 mmol/L (ref 135–145)

## 2019-11-28 LAB — MAGNESIUM: Magnesium: 2 mg/dL (ref 1.7–2.4)

## 2019-11-28 MED ORDER — AMLODIPINE BESYLATE 5 MG PO TABS: 7.5000 mg | ORAL_TABLET | Freq: Every day | ORAL | 1 refills | Status: DC

## 2019-11-28 MED ORDER — METOPROLOL TARTRATE 25 MG PO TABS: 25.0000 mg | ORAL_TABLET | ORAL | 1 refills | Status: DC | PRN

## 2019-11-28 NOTE — Patient Instructions (Signed)
Increase amlodipine to 7.5mg  once a day

## 2019-11-28 NOTE — Progress Notes (Signed)
Primary Care Physician: [name], DO Referring Physician: Dr. Radford Pax Cardiologist: Dr. Percival Spanish EP: Dr. [name] Hearing is a 73 y.o. female with a h/o afib, HTN, hyperlipidemia and TIA. She is in the afib clinic for f/u of Tikosyn use. She feels well with NSR today. She did have a mechanical fall a couple of weeks ago and was placed on metoprolol 25 mg bid for afib secondary to the fall. . She did convert in a couple of days and then stopped BB with issues with hypotension and bradycardia. Now however, her BP has been running higher, around [id number] systolic.  Continues on xarelto 20 mg daily with a CHA2DS2VASc of at least 5, without any signs of bleeding.   Today, she denies symptoms of palpitations, chest pain, shortness of breath, orthopnea, PND, lower extremity edema, dizziness, presyncope, syncope, or neurologic sequela. The patient is tolerating medications without difficulties and is otherwise without complaint today.   Past Medical History:  Diagnosis Date  . GERD (gastroesophageal reflux disease)   . HTN (hypertension)   . Hyperlipidemia   . TIA (transient ischemic attack)    Past Surgical History:  Procedure Laterality Date  . BOTOX INJECTION N/A 12/22/2018   Procedure: BOTOX INJECTION INTO ANAL SPHINCTER;  Surgeon: Ileana Roup, MD;  Location: [facility];  Service: General;  Laterality: N/A;  . CARDIOVERSION N/A 01/04/2018   Procedure: CARDIOVERSION;  Surgeon: Dorothy Spark, MD;  Location[facility] ENDOSCOPY;  Service: Cardiovascular;  Laterality: N/A;  . CARDIOVERSION N/A 09/20/2018   Procedure: CARDIOVERSION;  Surgeon: Sueanne Margarita, MD;  Location: [facility] ENDOSCOPY;  Service: Cardiovascular;  Laterality: N/A;  . CARDIOVERSION N/A 10/27/2018   Procedure: CARDIOVERSION;  Surgeon: Buford Dresser, MD;  Location: [facility] ENDOSCOPY;  Service: Cardiovascular;  Laterality: N/A;  . KNEE SURGERY Bilateral    Torn miniscus  . RECTAL EXAM UNDER ANESTHESIA N/A  12/22/2018   Procedure: ANORECTAL  EXAM UNDER ANESTHESIA;  Surgeon: Ileana Roup, MD;  Location: [facility]S;  Service: General;  Laterality: N/A;  . [place]    . TONSILLECTOMY    . VAGINAL HYSTERECTOMY      Current Outpatient Medications  Medication Sig Dispense Refill  . acetaminophen (TYLENOL) 500 MG tablet Take 500 mg by mouth every 6 (six) hours as needed for moderate pain or headache.     Marland Kitchen amLODipine (NORVASC) 5 MG tablet Take 1 tablet (5 mg total) by mouth daily. 90 tablet 1  . Cholecalciferol (VITAMIN D3) 5000 units CAPS Take 5,000 Units by mouth at bedtime.    . dofetilide (TIKOSYN) 500 MCG capsule TAKE 1 CAPSULE (500 MCG TOTAL) BY MOUTH 2 (TWO) TIMES DAILY. 60 capsule 6  . DULoxetine (CYMBALTA) 30 MG capsule TAKE 1 CAPSULE BY MOUTH EVERY DAY 90 capsule 2  . ezetimibe (ZETIA) 10 MG tablet Take 1 tablet (10 mg total) by mouth daily. 90 tablet 1  . famotidine (PEPCID) 20 MG tablet TAKE 1 TABLET BY MOUTH TWICE A DAY 60 tablet 12  . furosemide (LASIX) 20 MG tablet Take 1 tablet (20 mg total) by mouth daily for 210 doses. 90 tablet 1  . loratadine (CLARITIN) 10 MG tablet Take 1 tablet (10 mg total) by mouth daily. 30 tablet 11  . metoprolol tartrate (LOPRESSOR) 25 MG tablet Take 1 tablet (25 mg total) by mouth 2 (two) times daily. 60 tablet 1  . Potassium Chloride ER 20 MEQ TBCR Take 20 mEq by mouth 2 (two) times daily. 180 tablet  1  . XARELTO 20 MG TABS tablet TAKE 1 TABLET (20 MG TOTAL) BY MOUTH DAILY WITH SUPPER. 30 tablet 5   No current facility-administered medications for this encounter.    Allergies  Allergen Reactions  . Augmentin [Amoxicillin-Pot Clavulanate] Other (See Comments)    c-diff Has patient had a PCN reaction causing immediate rash, facial/tongue/throat swelling, SOB or lightheadedness with hypotension: No Has patient had a PCN reaction causing severe rash involving mucus membranes or skin necrosis: No Has patient had a PCN reaction that required  hospitalization: No Has patient had a PCN reaction occurring within the last 10 years: Yes If all of the above answers are "NO", then may proceed with Cephalosporin use.   . Codeine Palpitations and Rash  . Prednisone Other (See Comments)    Jittery, red in the face    Social History   Socioeconomic History  . Marital status: Widowed    Spouse name: Not on file  . Number of children: 2  . Years of education: 36  . Highest education level: Not on file  Occupational History  . Occupation: Retired    Comment: Personnel officer  Tobacco Use  . Smoking status: Never Smoker  . Smokeless tobacco: Never Used  Substance and Sexual Activity  . Alcohol use: No    Alcohol/week: 0.0 standard drinks  . Drug use: No  . Sexual activity: Not Currently  Other Topics Concern  . Not on file  Social History Narrative   Lives alone   Caffeine use: Soda/tea daily   Social Determinants of Health   Financial Resource Strain: Low Risk   . Difficulty of Paying Living Expenses: Not hard at all  Food Insecurity: No Food Insecurity  . Worried About Charity fundraiser in the Last Year: Never true  . Ran Out of Food in the Last Year: Never true  Transportation Needs: No Transportation Needs  . Lack of Transportation (Medical): No  . Lack of Transportation (Non-Medical): No  Physical Activity: Insufficiently Active  . Days of Exercise per Week: 3 days  . Minutes of Exercise per Session: 30 min  Stress: No Stress Concern Present  . Feeling of Stress : Not at all  Social Connections: Slightly Isolated  . Frequency of Communication with Friends and Family: More than three times a week  . Frequency of Social Gatherings with Friends and Family: More than three times a week  . Attends Religious Services: More than 4 times per year  . Active Member of Clubs or Organizations: Yes  . Attends Archivist Meetings: More than 4 times per year  . Marital Status: Widowed  Intimate Partner Violence:  Not At Risk  . Fear of Current or Ex-Partner: No  . Emotionally Abused: No  . Physically Abused: No  . Sexually Abused: No    Family History  Problem Relation Age of Onset  . Hypertension Mother   . Hyperlipidemia Mother   . Neurologic Disorder Mother 38       GB  . Stroke Son   . Stroke Maternal Uncle   . Stroke Grandchild   . Prostate cancer Brother     ROS- All systems are reviewed and negative except as per the HPI above  Physical Exam: Vitals:   11/28/19 1128  Pulse: 74  Weight: 128.1 kg  Height: 5\' 6"  (1.676 m)   Wt Readings from Last 3 Encounters:  11/28/19 128.1 kg  08/29/19 125.6 kg  05/25/19 122.7 kg    Labs:  Lab Results  Component Value Date   NA 139 08/29/2019   K 4.4 08/29/2019   CL 105 08/29/2019   CO2 23 08/29/2019   GLUCOSE 103 (H) 08/29/2019   BUN 8 08/29/2019   CREATININE 0.64 08/29/2019   CALCIUM 9.1 08/29/2019   MG 2.1 08/29/2019   Lab Results  Component Value Date   INR 1.10 02/26/2016   Lab Results  Component Value Date   CHOL 202 (H) 02/27/2016   HDL 44 02/27/2016   LDLCALC 139 (H) 02/27/2016   TRIG 97 02/27/2016     GEN- The patient is well appearing, alert and oriented x 3 today.   Head- normocephalic, atraumatic Eyes-  Sclera clear, conjunctiva pink Ears- hearing intact Oropharynx- clear Neck- supple, no JVP Lymph- no cervical lymphadenopathy Lungs- Clear to ausculation bilaterally, normal work of breathing Heart- regular rate and rhythm, no murmurs, rubs or gallops, PMI not laterally displaced GI- soft, NT, ND, + BS Extremities- no clubbing, cyanosis, trace LLE MS- no significant deformity or atrophy Skin- no rash or lesion Psych- euthymic mood, full affect Neuro- strength and sensation are intact  EKG- SR at 74 bpm,  PR int 200  ms, qrs int 78 ms, qtc 448 ms Echo-Study Conclusions  - Left ventricle: The cavity size was normal. There was mild   concentric hypertrophy. Systolic function was normal. The    estimated ejection fraction was in the range of 60% to 65%. Wall   motion was normal; there were no regional wall motion   abnormalities. - Aortic valve: Transvalvular velocity was within the normal range.   There was no stenosis. There was no regurgitation. - Aorta: Ascending aortic diameter: 38 mm (S). - Ascending aorta: The ascending aorta was mildly dilated. - Mitral valve: Mildly calcified annulus. Transvalvular velocity   was within the normal range. There was no evidence for stenosis.   There was mild regurgitation. - Left atrium: The atrium was mildly dilated.LAD 57 mm, volume 78 ml - Right ventricle: The cavity size was normal. Wall thickness was   normal. Systolic function was normal. - Atrial septum: No defect or patent foramen ovale was identified. - Tricuspid valve: There was mild-moderate regurgitation. - Pulmonic valve: There was moderate regurgitation. - Pulmonary arteries: Systolic pressure was severely increased. PA   peak pressure: 69 mm Hg (S).    Assessment and Plan: 1. Persistent afib Maintaining  SR on Tikosyn 500 mcg bid qtc stable Continue xarelto 20 mg daily for CHA2DS2VASc of at least 5  Bmet/mag today   2. HTN Elevated here and at home  Will increase amlodipine to  7.5 mg daily She will call with BP readings after one week   F/u with afib clinic in 6 months   [name] Collier, [facility] [address] [place], [place] [address] [phone]

## 2019-12-08 ENCOUNTER — Other Ambulatory Visit: Payer: Self-pay | Admitting: Family Medicine

## 2020-01-06 ENCOUNTER — Other Ambulatory Visit (HOSPITAL_COMMUNITY): Payer: Self-pay | Admitting: Physician Assistant

## 2020-01-11 ENCOUNTER — Other Ambulatory Visit: Payer: Self-pay | Admitting: Family Medicine

## 2020-02-07 ENCOUNTER — Other Ambulatory Visit: Payer: Self-pay | Admitting: Family Medicine

## 2020-02-18 NOTE — Progress Notes (Signed)
Subjective: CC: HTN, Afib PCP: [name], DO [name] Collier is a 74 y.o. female presenting to clinic today for:  1.  Hypertension/atrial fibrillation Patient reports compliance with furosemide 20 mg daily, 7-1/2 mg of amlodipine daily (this was increased recently by her A. fib clinic for uncontrolled blood pressure).  She notes that her blood pressures at home are typically in the 140s over 70s.  She has had some increasing lower extremity edema with left greater than right ankles affected.  This has been ongoing since increased dose of amlodipine.  No chest pain, orthopnea.  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  No hemoptysis, cough.  Her beta-blocker was discontinued due to hypotension.  She reports compliance with Xarelto but denies any hematochezia, melena, hematuria or vaginal bleeding.  2.  Hyperlipidemia Patient reports compliance with Zetia and reports good tolerance.  Denies any myalgia or arthralgias from this medicine.  She is been intolerant to statins previously.  Again no chest pain.  3.  Anxiety disorder Patient reports good control of anxiety disorder with Cymbalta.  She is had some increased anxiety surrounding her granddaughter who has been recently admitted to a mental health facility for uncontrolled anxiety and depression.  However, her granddaughter is now back home and doing well.   ROS: Per HPI  Allergies  Allergen Reactions  . Augmentin [Amoxicillin-Pot Clavulanate] Other (See Comments)    c-diff Has patient had a PCN reaction causing immediate rash, facial/tongue/throat swelling, SOB or lightheadedness with hypotension: No Has patient had a PCN reaction causing severe rash involving mucus membranes or skin necrosis: No Has patient had a PCN reaction that required hospitalization: No Has patient had a PCN reaction occurring within the last 10 years:  Yes If all of the above answers are "NO", then may proceed with Cephalosporin use.   . Codeine Palpitations and Rash  . Prednisone Other (See Comments)    Jittery, red in the face   Past Medical History:  Diagnosis Date  . GERD (gastroesophageal reflux disease)   . HTN (hypertension)   . Hyperlipidemia   . TIA (transient ischemic attack)     Current Outpatient Medications:  .  acetaminophen (TYLENOL) 500 MG tablet, Take 500 mg by mouth every 6 (six) hours as needed for moderate pain or headache. , Disp: , Rfl:  .  amLODipine (NORVASC) 5 MG tablet, TAKE 1 TABLET BY MOUTH  DAILY, Disp: 90 tablet, Rfl: 3 .  Cholecalciferol (VITAMIN D3) 5000 units CAPS, Take 5,000 Units by mouth at bedtime., Disp: , Rfl:  .  dofetilide (TIKOSYN) 500 MCG capsule, TAKE 1 CAPSULE (500 MCG TOTAL) BY MOUTH 2 (TWO) TIMES DAILY., Disp: 60 capsule, Rfl: 6 .  DULoxetine (CYMBALTA) 30 MG capsule, TAKE 1 CAPSULE BY MOUTH EVERY DAY, Disp: 90 capsule, Rfl: 2 .  ezetimibe (ZETIA) 10 MG tablet, TAKE 1 TABLET BY MOUTH  DAILY, Disp: 90 tablet, Rfl: 3 .  famotidine (PEPCID) 20 MG tablet, TAKE 1 TABLET BY MOUTH  TWICE DAILY, Disp: 180 tablet, Rfl: 3 .  furosemide (LASIX) 20 MG tablet, TAKE 1 TABLET BY MOUTH  DAILY, Disp: 90 tablet, Rfl: 3 .  loratadine (CLARITIN) 10 MG tablet, Take 1 tablet (10 mg total) by mouth daily., Disp: 30 tablet, Rfl: 11 .  metoprolol tartrate (LOPRESSOR) 25 MG tablet, TAKE 1 TABLET BY MOUTH TWICE A DAY, Disp: 60 tablet, Rfl: 6 .  Potassium Chloride  ER 20 MEQ TBCR, Take 20 mEq by mouth 2 (two) times daily., Disp: 180 tablet, Rfl: 1 .  XARELTO 20 MG TABS tablet, TAKE 1 TABLET (20 MG TOTAL) BY MOUTH DAILY WITH SUPPER., Disp: 30 tablet, Rfl: 5 Social History   Socioeconomic History  . Marital status: Widowed    Spouse name: Not on file  . Number of children: 2  . Years of education: 16  . Highest education level: Not on file  Occupational History  . Occupation: Retired    Comment: Personnel officer   Tobacco Use  . Smoking status: Never Smoker  . Smokeless tobacco: Never Used  Substance and Sexual Activity  . Alcohol use: No    Alcohol/week: 0.0 standard drinks  . Drug use: No  . Sexual activity: Not Currently  Other Topics Concern  . Not on file  Social History Narrative   Lives alone   Caffeine use: Soda/tea daily   Social Determinants of Health   Financial Resource Strain: Low Risk   . Difficulty of Paying Living Expenses: Not hard at all  Food Insecurity: No Food Insecurity  . Worried About Charity fundraiser in the Last Year: Never true  . Ran Out of Food in the Last Year: Never true  Transportation Needs: No Transportation Needs  . Lack of Transportation (Medical): No  . Lack of Transportation (Non-Medical): No  Physical Activity: Insufficiently Active  . Days of Exercise per Week: 3 days  . Minutes of Exercise per Session: 30 min  Stress: No Stress Concern Present  . Feeling of Stress : Not at all  Social Connections: Slightly Isolated  . Frequency of Communication with Friends and Family: More than three times a week  . Frequency of Social Gatherings with Friends and Family: More than three times a week  . Attends Religious Services: More than 4 times per year  . Active Member of Clubs or Organizations: Yes  . Attends Archivist Meetings: More than 4 times per year  . Marital Status: Widowed  Intimate Partner Violence: Not At Risk  . Fear of Current or Ex-Partner: No  . Emotionally Abused: No  . Physically Abused: No  . Sexually Abused: No   Family History  Problem Relation Age of Onset  . Hypertension Mother   . Hyperlipidemia Mother   . Neurologic Disorder Mother 54       GB  . Stroke Son   . Stroke Maternal Uncle   . Stroke Grandchild   . Prostate cancer Brother     Objective: Office vital signs reviewed. BP 140/76   Pulse 72   Temp 99.1 F (37.3 C) (Temporal)   Ht 5' 6"  (1.676 m)   Wt 285 lb (129.3 kg)   SpO2 94%   BMI 46.00  kg/m   Physical Examination:  General: Awake, alert, obese, No acute distress HEENT: Normal, sclera white, MMM Cardio: seemingly regular rate and rhythm, S1S2 heard, no murmurs appreciated Pulm: clear to auscultation bilaterally, no wheezes, rhonchi or rales; normal work of breathing on room air Extremities: warm, well perfused, 1+nonpitting edema to mid ankles. No cyanosis or clubbing; +2 pulses bilaterally MSK: antalgic gait and normal station Psych: Mood stable, speech normal, affect appropriate, pleasant and interactive Depression screen [facility] 2/9 02/19/2020 07/21/2019 04/24/2019  Decreased Interest 1 1 1   Down, Depressed, Hopeless 1 1 1   PHQ - 2 Score 2 2 2   Altered sleeping 2 1 1   Tired, decreased energy 2 1 1   Change  in appetite 3 1 1   Feeling bad or failure about yourself  0 1 1  Trouble concentrating 2 1 1   Moving slowly or fidgety/restless 0 1 1  Suicidal thoughts 0 0 0  PHQ-9 Score 11 8 8   Difficult doing work/chores Not difficult at all Somewhat difficult Somewhat difficult   GAD 7 : Generalized Anxiety Score 02/19/2020 07/21/2019 04/18/2019 03/14/2019  Nervous, Anxious, on Edge 2 1 1 3   Control/stop worrying 2 1 1 3   Worry too much - different things 3 1 1 3   Trouble relaxing 3 1 1 3   Restless 2 1 1 1   Easily annoyed or irritable 0 1 0 1  Afraid - awful might happen 2 1 1 3   Total GAD 7 Score 14 7 6 17   Anxiety Difficulty Not difficult at all Somewhat difficult - -   Assessment/ Plan: 74 y.o. female   1. PAF (paroxysmal atrial fibrillation) (HCC) Rate controlled and seemingly rhythm controlled today.  No problems with Xarelto.  Continue current regimen - TSH  2. Generalized anxiety disorder Somewhat exacerbated but I suspect this is due to recent events surrounding her granddaughter.  We will continue to monitor.  May need to consider increasing dose of Cymbalta if ongoing symptoms - TSH - DULoxetine (CYMBALTA) 30 MG capsule; TAKE 1 CAPSULE BY MOUTH EVERY DAY   Dispense: 90 capsule; Refill: 2  3. Depression, major, single episode, mild (HCC) - DULoxetine (CYMBALTA) 30 MG capsule; TAKE 1 CAPSULE BY MOUTH EVERY DAY  Dispense: 90 capsule; Refill: 2  4. Essential hypertension Controlled.  Because she is having increased lower extremity edema, I will reach out to her cardiologist to see if perhaps we might lower the dose of the calcium channel blocker again to 5 mg and maybe add back low-dose Benicar for blood pressure control.  5. Pure hypercholesterolemia Check fasting lipid panel - Lipid Panel - CMP14+EGFR - TSH  6. Chronic anticoagulation - CBC  7. Elevated serum glucose A1c was 5.7 - Bayer DCA Hb A1c Waived  8. Encounter for hepatitis C screening test for low risk patient - Hepatitis C antibody   No orders of the defined types were placed in this encounter.  No orders of the defined types were placed in this encounter.    [name], DO [place] [phone]

## 2020-02-19 ENCOUNTER — Ambulatory Visit (INDEPENDENT_AMBULATORY_CARE_PROVIDER_SITE_OTHER): Payer: Medicare Other | Admitting: Family Medicine

## 2020-02-19 ENCOUNTER — Other Ambulatory Visit: Payer: Self-pay

## 2020-02-19 ENCOUNTER — Encounter: Payer: Self-pay | Admitting: Family Medicine

## 2020-02-19 VITALS — BP 140/76 | HR 72 | Temp 99.1°F | Ht 66.0 in | Wt 285.0 lb

## 2020-02-19 DIAGNOSIS — F320 Major depressive disorder, single episode, mild: Secondary | ICD-10-CM | POA: Diagnosis not present

## 2020-02-19 DIAGNOSIS — I10 Essential (primary) hypertension: Secondary | ICD-10-CM | POA: Diagnosis not present

## 2020-02-19 DIAGNOSIS — I480 Paroxysmal atrial fibrillation: Secondary | ICD-10-CM

## 2020-02-19 DIAGNOSIS — E7800 Pure hypercholesterolemia, unspecified: Secondary | ICD-10-CM

## 2020-02-19 DIAGNOSIS — R739 Hyperglycemia, unspecified: Secondary | ICD-10-CM

## 2020-02-19 DIAGNOSIS — F411 Generalized anxiety disorder: Secondary | ICD-10-CM

## 2020-02-19 DIAGNOSIS — Z1159 Encounter for screening for other viral diseases: Secondary | ICD-10-CM | POA: Diagnosis not present

## 2020-02-19 DIAGNOSIS — Z7901 Long term (current) use of anticoagulants: Secondary | ICD-10-CM

## 2020-02-19 LAB — BAYER DCA HB A1C WAIVED: HB A1C (BAYER DCA - WAIVED): 5.7 % (ref <7.0)

## 2020-02-19 MED ORDER — AMLODIPINE BESYLATE 5 MG PO TABS: 7.5000 mg | ORAL_TABLET | Freq: Every day | ORAL | 3 refills | Status: DC

## 2020-02-19 MED ORDER — DULOXETINE HCL 30 MG PO CPEP: ORAL_CAPSULE | ORAL | 2 refills | Status: DC

## 2020-02-19 MED ORDER — LORATADINE 10 MG PO TABS: 10.0000 mg | ORAL_TABLET | Freq: Every day | ORAL | 11 refills | Status: DC

## 2020-02-19 MED ORDER — POTASSIUM CHLORIDE ER 20 MEQ PO TBCR: 20.0000 meq | EXTENDED_RELEASE_TABLET | Freq: Two times a day (BID) | ORAL | 1 refills | Status: DC

## 2020-02-19 NOTE — Addendum Note (Signed)
Addended by: [name] on: 02/19/2020 01:02 PM   Modules accepted: Orders

## 2020-02-19 NOTE — Patient Instructions (Addendum)
Sugar is borderline.  Watch carbohydrate intake.  I will reach out to the Afib doctor about reducing the Amlodipine to 5mg  and adding Benicar back.  I will contact you about this.  You had labs performed today.  You will be contacted with the results of the labs once they are available, usually in the next 3 business days for routine lab work.  If you have an active my chart account, they will be released to your MyChart.  If you prefer to have these labs released to you via telephone, please let us know.  If you had a pap smear or biopsy performed, expect to be contacted in about 7-10 days.

## 2020-02-20 ENCOUNTER — Other Ambulatory Visit: Payer: Self-pay | Admitting: Family Medicine

## 2020-02-20 LAB — CMP14+EGFR
ALT: 14 IU/L (ref 0–32)
AST: 19 IU/L (ref 0–40)
Albumin/Globulin Ratio: 1.2 (ref 1.2–2.2)
Albumin: 3.9 g/dL (ref 3.7–4.7)
Alkaline Phosphatase: 97 IU/L (ref 39–117)
BUN/Creatinine Ratio: 18 (ref 12–28)
BUN: 14 mg/dL (ref 8–27)
Bilirubin Total: 1.1 mg/dL (ref 0.0–1.2)
CO2: 22 mmol/L (ref 20–29)
Calcium: 9.2 mg/dL (ref 8.7–10.3)
Chloride: 106 mmol/L (ref 96–106)
Creatinine, Ser: 0.77 mg/dL (ref 0.57–1.00)
GFR calc Af Amer: 89 mL/min/{1.73_m2} (ref >59)
GFR calc non Af Amer: 77 mL/min/{1.73_m2} (ref >59)
Globulin, Total: 3.2 g/dL (ref 1.5–4.5)
Glucose: 107 mg/dL — ABNORMAL HIGH (ref 65–99)
Potassium: 4.3 mmol/L (ref 3.5–5.2)
Sodium: 143 mmol/L (ref 134–144)
Total Protein: 7.1 g/dL (ref 6.0–8.5)

## 2020-02-20 LAB — LIPID PANEL
Chol/HDL Ratio: 2.9 ratio (ref 0.0–4.4)
Cholesterol, Total: 178 mg/dL (ref 100–199)
HDL: 62 mg/dL (ref >39)
LDL Chol Calc (NIH): 93 mg/dL (ref 0–99)
Triglycerides: 134 mg/dL (ref 0–149)
VLDL Cholesterol Cal: 23 mg/dL (ref 5–40)

## 2020-02-20 LAB — CBC
Hematocrit: 41.6 % (ref 34.0–46.6)
Hemoglobin: 13.6 g/dL (ref 11.1–15.9)
MCH: 28.8 pg (ref 26.6–33.0)
MCHC: 32.7 g/dL (ref 31.5–35.7)
MCV: 88 fL (ref 79–97)
Platelets: 257 10*3/uL (ref 150–450)
RBC: 4.73 x10E6/uL (ref 3.77–5.28)
RDW: 12.8 % (ref 11.7–15.4)
WBC: 6.5 10*3/uL (ref 3.4–10.8)

## 2020-02-20 LAB — TSH: TSH: 4.46 u[IU]/mL (ref 0.450–4.500)

## 2020-02-20 MED ORDER — AMLODIPINE BESYLATE 5 MG PO TABS: 5.0000 mg | ORAL_TABLET | Freq: Every day | ORAL | 3 refills | Status: DC

## 2020-02-20 MED ORDER — OLMESARTAN MEDOXOMIL 5 MG PO TABS: 5.0000 mg | ORAL_TABLET | Freq: Every day | ORAL | 3 refills | Status: DC

## 2020-02-20 NOTE — Progress Notes (Signed)
The 10-year ASCVD risk score Mikey Bussing DC Brooke Bonito., et al., 2013) is: 19.7%   Values used to calculate the score:     Age: 74 years     Sex: Female     Is Non-Hispanic African American: No     Diabetic: No     Tobacco smoker: No     Systolic Blood Pressure: [id number] mmHg     Is BP treated: Yes     HDL Cholesterol: 62 mg/dL     Total Cholesterol: 178 mg/dL  I reviewed labs with patient.  Her ASCVD risk or is high but overall cholesterol panel is better than previous check. She is intolerant to statins and relies only on Zetia for cholesterol control.  Unable to take fish oils at this time due to chronic anticoagulation with Xarelto.  I reinforced lifestyle modification and she is willing to do this.  I informed her of the change in the amlodipine back down to 5 mg daily and added Benicar at 5 mg daily as well.  She will monitor her blood pressures and contact me if blood pressures go above 140/90, at which point we will increase the Benicar dose.

## 2020-02-21 LAB — SPECIMEN STATUS REPORT

## 2020-02-21 LAB — HEPATITIS C ANTIBODY: Hep C Virus Ab: <0.1 s/co ratio (ref 0.0–0.9)

## 2020-02-29 ENCOUNTER — Ambulatory Visit: Payer: Medicare Other | Attending: Internal Medicine

## 2020-02-29 DIAGNOSIS — Z23 Encounter for immunization: Secondary | ICD-10-CM

## 2020-02-29 NOTE — Progress Notes (Signed)
   Covid-19 Vaccination Clinic  Name:  [name] Collier    MRN: [id number] DOB: 1946-02-25  02/29/2020  [name] was observed post Covid-19 immunization for 15 minutes without incident. She was provided with Vaccine Information Sheet and instruction to access the V-Safe system.   [name] was instructed to call 911 with any severe reactions post vaccine: Marland Kitchen Difficulty breathing  . Swelling of face and throat  . A fast heartbeat  . A bad rash all over body  . Dizziness and weakness   Immunizations Administered    Name Date Dose VIS Date Route   Moderna COVID-19 Vaccine 02/29/2020  1:24 PM 0.5 mL 11/07/2019 Intramuscular   Manufacturer: Moderna   Lot: [id number]   [id number]

## 2020-03-28 ENCOUNTER — Ambulatory Visit: Payer: Medicare Other | Attending: Internal Medicine

## 2020-03-28 DIAGNOSIS — Z23 Encounter for immunization: Secondary | ICD-10-CM

## 2020-03-28 NOTE — Progress Notes (Signed)
   Covid-19 Vaccination Clinic  Name:  [name] Collier    MRN: [id number] DOB: Jan 06, 1946  03/28/2020  [name] was observed post Covid-19 immunization for 15 minutes without incident. She was provided with Vaccine Information Sheet and instruction to access the V-Safe system.   [name] was instructed to call 911 with any severe reactions post vaccine: Marland Kitchen Difficulty breathing  . Swelling of face and throat  . A fast heartbeat  . A bad rash all over body  . Dizziness and weakness   Immunizations Administered    Name Date Dose VIS Date Route   Moderna COVID-19 Vaccine 03/28/2020  1:32 PM 0.5 mL 11/2019 Intramuscular   Manufacturer: Levan Hurst   Lot: [id number]   [id number]

## 2020-04-03 DIAGNOSIS — M25562 Pain in left knee: Secondary | ICD-10-CM | POA: Diagnosis not present

## 2020-04-03 DIAGNOSIS — M1712 Unilateral primary osteoarthritis, left knee: Secondary | ICD-10-CM | POA: Diagnosis not present

## 2020-04-03 DIAGNOSIS — M25561 Pain in right knee: Secondary | ICD-10-CM | POA: Diagnosis not present

## 2020-04-03 DIAGNOSIS — M170 Bilateral primary osteoarthritis of knee: Secondary | ICD-10-CM | POA: Diagnosis not present

## 2020-04-13 ENCOUNTER — Other Ambulatory Visit: Payer: Self-pay | Admitting: Family Medicine

## 2020-04-24 ENCOUNTER — Ambulatory Visit (INDEPENDENT_AMBULATORY_CARE_PROVIDER_SITE_OTHER): Payer: Medicare Other

## 2020-04-24 DIAGNOSIS — Z0000 Encounter for general adult medical examination without abnormal findings: Secondary | ICD-10-CM | POA: Diagnosis not present

## 2020-04-24 NOTE — Patient Instructions (Signed)
  [place] Maintenance Summary and Written Plan of Care  [name] ,  Thank you for allowing me to perform your Medicare Annual Wellness Visit and for your ongoing commitment to your health.   Health Maintenance & Immunization History Health Maintenance  Topic Date Due  . TETANUS/TDAP  Never done  . COLONOSCOPY  04/15/2020  . INFLUENZA VACCINE  07/07/2020  . MAMMOGRAM  08/07/2020  . DEXA SCAN  Completed  . COVID-19 Vaccine  Completed  . Hepatitis C Screening  Completed  . PNA vac Low Risk Adult  Completed   Immunization History  Administered Date(s) Administered  . Moderna SARS-COVID-2 Vaccination 02/29/2020, 03/28/2020  . Pneumococcal Conjugate-13 12/30/2016  . Pneumococcal Polysaccharide-23 02/08/2018    These are the patient goals that we discussed: Goals Addressed            This Visit's Progress   . Client will verbalize knowledge of self management of Hypertension as evidences by BP reading of 140/90 or less; or as defined by provider      . Exercise 150 min/wk Moderate Activity      . Obtain Annual Eye (retinal)  Exam           This is a list of Health Maintenance Items that are overdue or due now: Health Maintenance Due  Topic Date Due  . TETANUS/TDAP  Never done  . COLONOSCOPY  04/15/2020     Orders/Referrals Placed Today: No orders of the defined types were placed in this encounter.  (Contact our referral department at [phone] if you have not spoken with someone about your referral appointment within the next 5 days)    Follow-up Plan  Scheduled with Ronnie Doss, DO 05/29/2020 at 8:15am

## 2020-04-24 NOTE — Progress Notes (Signed)
MEDICARE ANNUAL WELLNESS VISIT  04/24/2020  Telephone Visit Disclaimer This Medicare AWV was conducted by telephone due to national recommendations for restrictions regarding the COVID-19 Pandemic (e.g. social distancing).  I verified, using two identifiers, that I am speaking with [name] Collier or their authorized healthcare agent. I discussed the limitations, risks, security, and privacy concerns of performing an evaluation and management service by telephone and the potential availability of an in-person appointment in the future. The patient expressed understanding and agreed to proceed.   Subjective:  [name] Collier is a 74 y.o. female patient of [name], DO who had a Medicare Annual Wellness Visit today via telephone. Lis is Retired and lives alone. she has two children. she reports that she is socially active and does interact with friends/family regularly. she is not physically active and enjoys word searches.  Patient Care Team: [name], DO as PCP - General (Family Medicine) Minus Breeding, MD as PCP - Cardiology (Cardiology) Thompson Grayer, MD as PCP - Electrophysiology (Cardiology)  Advanced Directives 04/24/2020 04/24/2019 12/22/2018 12/20/2018 10/25/2018 09/20/2018 05/06/2018  Does Patient Have a Medical Advance Directive? Yes Yes Yes Yes Yes Yes Yes  Type of Paramedic of [place];Living will Living will;Healthcare Power of Attorney Living will;Healthcare Power of Attorney Living will;Healthcare Power of Attorney Living will;Healthcare Power of [place];Living will [place];Living will  Does patient want to make changes to medical advance directive? No - Patient declined No - Patient declined No - Patient declined No - Patient declined No - Patient declined - No - Patient declined  Copy of [place] in Chart? - No - copy requested No - copy requested No - copy  requested No - copy requested Yes Yes  Would patient like information on creating a medical advance directive? - - - - - [facility] Utilization Over the Past 12 Months: # of hospitalizations or ER visits: 0 # of surgeries: 0  Review of Systems    Patient reports that her overall health is unchanged compared to last year.   Patient Reported Readings (BP, Pulse, CBG, Weight, etc) none  Pain Assessment Pain : No/denies pain     Current Medications & Allergies (verified) Allergies as of 04/24/2020      Reactions   Augmentin [amoxicillin-pot Clavulanate] Other (See Comments)   c-diff Has patient had a PCN reaction causing immediate rash, facial/tongue/throat swelling, SOB or lightheadedness with hypotension: No Has patient had a PCN reaction causing severe rash involving mucus membranes or skin necrosis: No Has patient had a PCN reaction that required hospitalization: No Has patient had a PCN reaction occurring within the last 10 years: Yes If all of the above answers are "NO", then may proceed with Cephalosporin use.   Codeine Palpitations, Rash   Prednisone Other (See Comments)   Jittery, red in the face      Medication List       Accurate as of Apr 24, 2020  9:20 AM. If you have any questions, ask your nurse or doctor.        acetaminophen 500 MG tablet Commonly known as: TYLENOL Take 500 mg by mouth every 6 (six) hours as needed for moderate pain or headache.   amLODipine 5 MG tablet Commonly known as: NORVASC Take 1 tablet (5 mg total) by mouth daily.   dofetilide 500 MCG capsule Commonly known as: TIKOSYN TAKE 1 CAPSULE (500 MCG  TOTAL) BY MOUTH 2 (TWO) TIMES DAILY.   DULoxetine 30 MG capsule Commonly known as: CYMBALTA TAKE 1 CAPSULE BY MOUTH EVERY DAY   ezetimibe 10 MG tablet Commonly known as: ZETIA TAKE 1 TABLET BY MOUTH  DAILY   famotidine 20 MG tablet Commonly known as: PEPCID TAKE 1 TABLET BY MOUTH  TWICE DAILY   furosemide 20 MG  tablet Commonly known as: LASIX TAKE 1 TABLET BY MOUTH  DAILY   loratadine 10 MG tablet Commonly known as: CLARITIN Take 1 tablet (10 mg total) by mouth daily.   olmesartan 5 MG tablet Commonly known as: Benicar Take 1 tablet (5 mg total) by mouth daily.   Potassium Chloride ER 20 MEQ Tbcr Take 20 mEq by mouth 2 (two) times daily.   Vitamin D3 125 MCG (5000 UT) Caps Take 5,000 Units by mouth at bedtime.   Xarelto 20 MG Tabs tablet Generic drug: rivaroxaban TAKE 1 TABLET (20 MG TOTAL) BY MOUTH DAILY WITH SUPPER.       History (reviewed): Past Medical History:  Diagnosis Date  . GERD (gastroesophageal reflux disease)   . HTN (hypertension)   . Hyperlipidemia   . TIA (transient ischemic attack)    Past Surgical History:  Procedure Laterality Date  . BOTOX INJECTION N/A 12/22/2018   Procedure: BOTOX INJECTION INTO ANAL SPHINCTER;  Surgeon: Ileana Roup, MD;  Location: [facility] ORS;  Service: General;  Laterality: N/A;  . CARDIOVERSION N/A 01/04/2018   Procedure: CARDIOVERSION;  Surgeon: Dorothy Spark, MD;  Location: [facility] ENDOSCOPY;  Service: Cardiovascular;  Laterality: N/A;  . CARDIOVERSION N/A 09/20/2018   Procedure: CARDIOVERSION;  Surgeon: Sueanne Margarita, MD;  Location: [facility] ENDOSCOPY;  Service: Cardiovascular;  Laterality: N/A;  . CARDIOVERSION N/A 10/27/2018   Procedure: CARDIOVERSION;  Surgeon: Buford Dresser, MD;  Location: [facility] ENDOSCOPY;  Service: Cardiovascular;  Laterality: N/A;  . KNEE SURGERY Bilateral    Torn miniscus  . RECTAL EXAM UNDER ANESTHESIA N/A 12/22/2018   Procedure: ANORECTAL  EXAM UNDER ANESTHESIA;  Surgeon: Ileana Roup, MD;  Location: [facility] ORS;  Service: General;  Laterality: N/A;  . [place]    . TONSILLECTOMY    . VAGINAL HYSTERECTOMY     Family History  Problem Relation Age of Onset  . Hypertension Mother   . Hyperlipidemia Mother   . Neurologic Disorder Mother 83       GB  . Stroke Son   . Stroke Maternal  Uncle   . Stroke Grandchild   . Prostate cancer Brother    Social History   Socioeconomic History  . Marital status: Widowed    Spouse name: Not on file  . Number of children: 2  . Years of education: 66  . Highest education level: Not on file  Occupational History  . Occupation: Retired    Comment: Personnel officer  Tobacco Use  . Smoking status: Never Smoker  . Smokeless tobacco: Never Used  Substance and Sexual Activity  . Alcohol use: No    Alcohol/week: 0.0 standard drinks  . Drug use: No  . Sexual activity: Not Currently  Other Topics Concern  . Not on file  Social History Narrative   Lives alone   Caffeine use: Soda/tea daily   Social Determinants of Health   Financial Resource Strain:   . Difficulty of Paying Living Expenses:   Food Insecurity:   . Worried About Charity fundraiser in the Last Year:   . [place] in the Last  Year:   Transportation Needs:   . Film/video editor (Medical):   Marland Kitchen Lack of Transportation (Non-Medical):   Physical Activity:   . Days of Exercise per Week:   . Minutes of Exercise per Session:   Stress:   . Feeling of Stress :   Social Connections:   . Frequency of Communication with Friends and Family:   . Frequency of Social Gatherings with Friends and Family:   . Attends Religious Services:   . Active Member of Clubs or Organizations:   . Attends Archivist Meetings:   Marland Kitchen Marital Status:     Activities of Daily Living In your present state of health, do you have any difficulty performing the following activities: 04/24/2020  Hearing? N  Vision? N  Difficulty concentrating or making decisions? N  Walking or climbing stairs? N  Dressing or bathing? N  Doing errands, shopping? N  Preparing Food and eating ? N  Using the Toilet? N  In the past six months, have you accidently leaked urine? N  Do you have problems with loss of bowel control? N  Managing your Medications? N  Managing your Finances? N   Housekeeping or managing your Housekeeping? N  Some recent data might be hidden    Patient Education/ Literacy How often do you need to have someone help you when you read instructions, pamphlets, or other written materials from your doctor or pharmacy?: 1 - Never What is the last grade level you completed in school?: 12th grade  Exercise Current Exercise Habits: The patient does not participate in regular exercise at present, Exercise limited by: None identified  Diet Patient reports consuming 3 meals a day and 2 snack(s) a day Patient reports that her primary diet is: Low Sodium Patient reports that she does have regular access to food.   Depression Screen PHQ 2/9 Scores 04/24/2020 02/19/2020 07/21/2019 04/24/2019 04/18/2019 03/14/2019  PHQ - 2 Score 0 2 2 2 2 2   PHQ- 9 Score - 11 8 8 8 16      Fall Risk Fall Risk  04/24/2020 04/24/2019 03/14/2019 08/04/2016  Falls in the past year? 1 0 0 No  Number falls in past yr: 0 - - -  Injury with Fall? 0 - - -  Risk for fall due to : Impaired balance/gait - - -  Follow up Falls evaluation completed - - -     Objective:  Nakiesha Erasmo Leventhal seemed alert and oriented and she participated appropriately during our telephone visit.  Blood Pressure Weight BMI  BP Readings from Last 3 Encounters:  02/19/20 140/76  11/28/19 (!) 170/72  08/29/19 136/76   Wt Readings from Last 3 Encounters:  02/19/20 285 lb (129.3 kg)  11/28/19 282 lb 6.4 oz (128.1 kg)  08/29/19 277 lb (125.6 kg)   BMI Readings from Last 1 Encounters:  02/19/20 46.00 kg/m    *Unable to obtain current vital signs, weight, and BMI due to telephone visit type  Hearing/Vision  . Jeniya did not seem to have difficulty with hearing/understanding during the telephone conversation . Reports that she has not had a formal eye exam by an eye care professional within the past year . Reports that she has not had a formal hearing evaluation within the past year *Unable to fully assess hearing  and vision during telephone visit type  Cognitive Function: 6CIT Screen 04/24/2020 04/24/2019  What Year? 0 points 0 points  What month? 0 points 0 points  What time? 0 points 0  points  Count back from 20 2 points 0 points  Months in reverse 0 points 0 points  Repeat phrase 0 points 0 points  Total Score 2 0   (Normal:0-7, Significant for Dysfunction: >8)  Normal Cognitive Function Screening: Yes   Immunization & Health Maintenance Record Immunization History  Administered Date(s) Administered  . Moderna SARS-COVID-2 Vaccination 02/29/2020, 03/28/2020  . Pneumococcal Conjugate-13 12/30/2016  . Pneumococcal Polysaccharide-23 02/08/2018    Health Maintenance  Topic Date Due  . TETANUS/TDAP  Never done  . COLONOSCOPY  04/15/2020  . INFLUENZA VACCINE  07/07/2020  . MAMMOGRAM  08/07/2020  . DEXA SCAN  Completed  . COVID-19 Vaccine  Completed  . Hepatitis C Screening  Completed  . PNA vac Low Risk Adult  Completed       Assessment  This is a routine wellness examination for [name] Collier.  Health Maintenance: Due or Overdue Health Maintenance Due  Topic Date Due  . TETANUS/TDAP  Never done  . COLONOSCOPY  04/15/2020    [name] Collier does not need a referral for Community Assistance: Care Management:   no Social Work:    no Prescription Assistance:  no Nutrition/Diabetes Education:  no   Plan:  Personalized Goals Goals Addressed            This Visit's Progress   . Client will verbalize knowledge of self management of Hypertension as evidences by BP reading of 140/90 or less; or as defined by provider      . Exercise 150 min/wk Moderate Activity      . Obtain Annual Eye (retinal)  Exam         Personalized Health Maintenance & Screening Recommendations    Lung Cancer Screening Recommended: no (Low Dose CT Chest recommended if Age 62-80 years, 30 pack-year currently smoking OR have quit w/in past 15 years) Hepatitis C Screening recommended: no HIV  Screening recommended: no  Advanced Directives: Written information was not prepared per patient's request.  Referrals & Orders No orders of the defined types were placed in this encounter.   Follow-up Plan . Follow-up with [name], DO as planned . Schedule 05/29/2020    I have personally reviewed and noted the following in the patient's chart:   . Medical and social history . Use of alcohol, tobacco or illicit drugs  . Current medications and supplements . Functional ability and status . Nutritional status . Physical activity . Advanced directives . List of other physicians . Hospitalizations, surgeries, and ER visits in previous 12 months . Vitals . Screenings to include cognitive, depression, and falls . Referrals and appointments  In addition, I have reviewed and discussed with [name] Collier certain preventive protocols, quality metrics, and best practice recommendations. A written personalized care plan for preventive services as well as general preventive health recommendations is available and can be mailed to the patient at her request.      Maud Deed [facility]  [phone]

## 2020-05-08 DIAGNOSIS — M170 Bilateral primary osteoarthritis of knee: Secondary | ICD-10-CM | POA: Diagnosis not present

## 2020-05-10 ENCOUNTER — Other Ambulatory Visit: Payer: Self-pay | Admitting: Cardiology

## 2020-05-15 DIAGNOSIS — M170 Bilateral primary osteoarthritis of knee: Secondary | ICD-10-CM | POA: Diagnosis not present

## 2020-05-21 ENCOUNTER — Ambulatory Visit (INDEPENDENT_AMBULATORY_CARE_PROVIDER_SITE_OTHER): Payer: Medicare Other | Admitting: Family Medicine

## 2020-05-21 DIAGNOSIS — J40 Bronchitis, not specified as acute or chronic: Secondary | ICD-10-CM

## 2020-05-21 MED ORDER — ALBUTEROL SULFATE HFA 108 (90 BASE) MCG/ACT IN AERS: 2.0000 | INHALATION_SPRAY | Freq: Four times a day (QID) | RESPIRATORY_TRACT | 0 refills | Status: DC | PRN

## 2020-05-21 MED ORDER — PREDNISONE 20 MG PO TABS: 40.0000 mg | ORAL_TABLET | Freq: Every day | ORAL | 0 refills | Status: AC

## 2020-05-21 MED ORDER — BENZONATATE 200 MG PO CAPS: 200.0000 mg | ORAL_CAPSULE | Freq: Three times a day (TID) | ORAL | 0 refills | Status: DC | PRN

## 2020-05-21 NOTE — Progress Notes (Signed)
Telephone visit  Subjective: CC: Bronchitis PCP: [name], DO [facility]:WEXHB MERIS REEDE is a 74 y.o. female calls for telephone consult today. Patient provides verbal consent for consult held via phone.  Due to COVID-19 pandemic this visit was conducted virtually. This visit type was conducted due to national recommendations for restrictions regarding the COVID-19 Pandemic (e.g. social distancing, sheltering in place) in an effort to limit this patient's exposure and mitigate transmission in our community. All issues noted in this document were discussed and addressed.  A physical exam was not performed with this format.   Location of patient: home Location of provider: [facility] Others present for call: none  1. Bronchitis Onset of clear productive cough, chest congestion and wheezing on Friday.  She has not used any medication yet.  Symptoms are worse with lying down.  She feels short of breath with coughing. No fevers, chills, hemoptysis.  She has had her COVID vaccinations.  ROS: Per HPI  Allergies  Allergen Reactions  . Augmentin [Amoxicillin-Pot Clavulanate] Other (See Comments)    c-diff Has patient had a PCN reaction causing immediate rash, facial/tongue/throat swelling, SOB or lightheadedness with hypotension: No Has patient had a PCN reaction causing severe rash involving mucus membranes or skin necrosis: No Has patient had a PCN reaction that required hospitalization: No Has patient had a PCN reaction occurring within the last 10 years: Yes If all of the above answers are "NO", then may proceed with Cephalosporin use.   . Codeine Palpitations and Rash  . Prednisone Other (See Comments)    Jittery, red in the face   Past Medical History:  Diagnosis Date  . GERD (gastroesophageal reflux disease)   . HTN (hypertension)   . Hyperlipidemia   . TIA (transient ischemic attack)     Current Outpatient Medications:  .  acetaminophen (TYLENOL) 500 MG tablet, Take 500 mg by  mouth every 6 (six) hours as needed for moderate pain or headache. , Disp: , Rfl:  .  amLODipine (NORVASC) 5 MG tablet, Take 1 tablet (5 mg total) by mouth daily., Disp: 90 tablet, Rfl: 3 .  Cholecalciferol (VITAMIN D3) 5000 units CAPS, Take 5,000 Units by mouth at bedtime., Disp: , Rfl:  .  dofetilide (TIKOSYN) 500 MCG capsule, TAKE 1 CAPSULE (500 MCG TOTAL) BY MOUTH 2 (TWO) TIMES DAILY., Disp: 60 capsule, Rfl: 6 .  DULoxetine (CYMBALTA) 30 MG capsule, TAKE 1 CAPSULE BY MOUTH EVERY DAY, Disp: 90 capsule, Rfl: 2 .  ezetimibe (ZETIA) 10 MG tablet, TAKE 1 TABLET BY MOUTH  DAILY, Disp: 90 tablet, Rfl: 3 .  famotidine (PEPCID) 20 MG tablet, TAKE 1 TABLET BY MOUTH  TWICE DAILY, Disp: 180 tablet, Rfl: 3 .  furosemide (LASIX) 20 MG tablet, TAKE 1 TABLET BY MOUTH  DAILY, Disp: 90 tablet, Rfl: 3 .  loratadine (CLARITIN) 10 MG tablet, Take 1 tablet (10 mg total) by mouth daily., Disp: 30 tablet, Rfl: 11 .  olmesartan (BENICAR) 5 MG tablet, Take 1 tablet (5 mg total) by mouth daily., Disp: 90 tablet, Rfl: 3 .  Potassium Chloride ER 20 MEQ TBCR, Take 20 mEq by mouth 2 (two) times daily., Disp: 180 tablet, Rfl: 1 .  XARELTO 20 MG TABS tablet, TAKE 1 TABLET (20 MG TOTAL) BY MOUTH DAILY WITH SUPPER., Disp: 30 tablet, Rfl: 5  Assessment/ Plan: 74 y.o. female   1. Bronchitis Unlikely to be bacterial at this point.  Low suspicion for COVID-19 given completion of vaccinations.  Going empirically treat her with  a little bit of prednisone, which she has been able to take it just because of some flushing in the face.  Tessalon Perles prescribed given codeine allergy.  Albuterol inhaler prescribed.  Caution increased heart rate with the albuterol.  Unfortunately the Xopenex is not covered by her insurance.  We discussed red flag signs and symptoms warranting further evaluation.  She voiced good understanding will follow up as needed - benzonatate (TESSALON) 200 MG capsule; Take 1 capsule (200 mg total) by mouth 3 (three)  times daily as needed for cough.  Dispense: 30 capsule; Refill: 0 - predniSONE (DELTASONE) 20 MG tablet; Take 2 tablets (40 mg total) by mouth daily with breakfast for 3 days.  Dispense: 6 tablet; Refill: 0 - albuterol (VENTOLIN HFA) 108 (90 Base) MCG/ACT inhaler; Inhale 2 puffs into the lungs every 6 (six) hours as needed for wheezing or shortness of breath.  Dispense: 6.7 g; Refill: 0   Start time: 10:57am End time: 11:07am  Total time spent on patient care (including telephone call/ virtual visit): 15 minutes  [place], [place] [phone]

## 2020-05-22 DIAGNOSIS — M170 Bilateral primary osteoarthritis of knee: Secondary | ICD-10-CM | POA: Diagnosis not present

## 2020-05-28 ENCOUNTER — Other Ambulatory Visit: Payer: Self-pay

## 2020-05-28 ENCOUNTER — Encounter (HOSPITAL_COMMUNITY): Payer: Self-pay | Admitting: Nurse Practitioner

## 2020-05-28 ENCOUNTER — Ambulatory Visit (HOSPITAL_COMMUNITY)
Admission: RE | Admit: 2020-05-28 | Discharge: 2020-05-28 | Disposition: A | Discharge status: Home / Self Care | Payer: Medicare Other | Source: Ambulatory Visit | Attending: Nurse Practitioner | Admitting: Nurse Practitioner

## 2020-05-28 VITALS — BP 132/70 | HR 63 | Ht 66.0 in | Wt 281.2 lb

## 2020-05-28 DIAGNOSIS — Z79899 Other long term (current) drug therapy: Secondary | ICD-10-CM | POA: Insufficient documentation

## 2020-05-28 DIAGNOSIS — I10 Essential (primary) hypertension: Secondary | ICD-10-CM | POA: Insufficient documentation

## 2020-05-28 DIAGNOSIS — Z7901 Long term (current) use of anticoagulants: Secondary | ICD-10-CM | POA: Diagnosis not present

## 2020-05-28 DIAGNOSIS — Z823 Family history of stroke: Secondary | ICD-10-CM | POA: Insufficient documentation

## 2020-05-28 DIAGNOSIS — Z8249 Family history of ischemic heart disease and other diseases of the circulatory system: Secondary | ICD-10-CM | POA: Insufficient documentation

## 2020-05-28 DIAGNOSIS — Z881 Allergy status to other antibiotic agents status: Secondary | ICD-10-CM | POA: Insufficient documentation

## 2020-05-28 DIAGNOSIS — I480 Paroxysmal atrial fibrillation: Secondary | ICD-10-CM

## 2020-05-28 DIAGNOSIS — Z8673 Personal history of transient ischemic attack (TIA), and cerebral infarction without residual deficits: Secondary | ICD-10-CM | POA: Insufficient documentation

## 2020-05-28 DIAGNOSIS — Z888 Allergy status to other drugs, medicaments and biological substances status: Secondary | ICD-10-CM | POA: Insufficient documentation

## 2020-05-28 DIAGNOSIS — K219 Gastro-esophageal reflux disease without esophagitis: Secondary | ICD-10-CM | POA: Diagnosis not present

## 2020-05-28 DIAGNOSIS — I4819 Other persistent atrial fibrillation: Secondary | ICD-10-CM | POA: Insufficient documentation

## 2020-05-28 DIAGNOSIS — Z885 Allergy status to narcotic agent status: Secondary | ICD-10-CM | POA: Diagnosis not present

## 2020-05-28 DIAGNOSIS — D6869 Other thrombophilia: Secondary | ICD-10-CM | POA: Diagnosis not present

## 2020-05-28 DIAGNOSIS — Z8349 Family history of other endocrine, nutritional and metabolic diseases: Secondary | ICD-10-CM | POA: Diagnosis not present

## 2020-05-28 DIAGNOSIS — E785 Hyperlipidemia, unspecified: Secondary | ICD-10-CM | POA: Insufficient documentation

## 2020-05-28 LAB — BASIC METABOLIC PANEL
Anion gap: 10 (ref 5–15)
BUN: 14 mg/dL (ref 8–23)
CO2: 25 mmol/L (ref 22–32)
Calcium: 9.3 mg/dL (ref 8.9–10.3)
Chloride: 103 mmol/L (ref 98–111)
Creatinine, Ser: 0.81 mg/dL (ref 0.44–1.00)
GFR calc Af Amer: >60 mL/min (ref >60)
GFR calc non Af Amer: >60 mL/min (ref >60)
Glucose, Bld: 117 mg/dL — ABNORMAL HIGH (ref 70–99)
Potassium: 4.5 mmol/L (ref 3.5–5.1)
Sodium: 138 mmol/L (ref 135–145)

## 2020-05-28 LAB — MAGNESIUM: Magnesium: 2.2 mg/dL (ref 1.7–2.4)

## 2020-05-28 NOTE — Progress Notes (Signed)
Primary Care Physician: [name], DO Referring Physician: Dr. Radford Pax Cardiologist: Dr. Percival Spanish EP: Dr. [name] Hearing is a 74 y.o. female with a h/o afib, HTN, hyperlipidemia and TIA. She is in the afib clinic for f/u of Tikosyn use. She feels well with NSR today. has not appreciated any afib. Continues on xarelto 20 mg daily with a CHA2DS2VASc of at least 5, without any signs of bleeding.   Today, she denies symptoms of palpitations, chest pain, shortness of breath, orthopnea, PND, lower extremity edema, dizziness, presyncope, syncope, or neurologic sequela. The patient is tolerating medications without difficulties and is otherwise without complaint today.   Past Medical History:  Diagnosis Date  . GERD (gastroesophageal reflux disease)   . HTN (hypertension)   . Hyperlipidemia   . TIA (transient ischemic attack)    Past Surgical History:  Procedure Laterality Date  . BOTOX INJECTION N/A 12/22/2018   Procedure: BOTOX INJECTION INTO ANAL SPHINCTER;  Surgeon: Ileana Roup, MD;  Location: [facility] ORS;  Service: General;  Laterality: N/A;  . CARDIOVERSION N/A 01/04/2018   Procedure: CARDIOVERSION;  Surgeon: Dorothy Spark, MD;  Location: [facility] ENDOSCOPY;  Service: Cardiovascular;  Laterality: N/A;  . CARDIOVERSION N/A 09/20/2018   Procedure: CARDIOVERSION;  Surgeon: Sueanne Margarita, MD;  Location: [facility] ENDOSCOPY;  Service: Cardiovascular;  Laterality: N/A;  . CARDIOVERSION N/A 10/27/2018   Procedure: CARDIOVERSION;  Surgeon: Buford Dresser, MD;  Location: [facility] ENDOSCOPY;  Service: Cardiovascular;  Laterality: N/A;  . KNEE SURGERY Bilateral    Torn miniscus  . RECTAL EXAM UNDER ANESTHESIA N/A 12/22/2018   Procedure: ANORECTAL  EXAM UNDER ANESTHESIA;  Surgeon: Ileana Roup, MD;  Location: [facility] ORS;  Service: General;  Laterality: N/A;  . [place]    . TONSILLECTOMY    . VAGINAL HYSTERECTOMY      Current Outpatient Medications  Medication  Sig Dispense Refill  . acetaminophen (TYLENOL) 500 MG tablet Take 500 mg by mouth every 6 (six) hours as needed for moderate pain or headache.     . albuterol (VENTOLIN HFA) 108 (90 Base) MCG/ACT inhaler Inhale 2 puffs into the lungs every 6 (six) hours as needed for wheezing or shortness of breath. 6.7 g 0  . amLODipine (NORVASC) 5 MG tablet Take 1 tablet (5 mg total) by mouth daily. 90 tablet 3  . benzonatate (TESSALON) 200 MG capsule Take 1 capsule (200 mg total) by mouth 3 (three) times daily as needed for cough. 30 capsule 0  . Cholecalciferol (VITAMIN D3) 5000 units CAPS Take 5,000 Units by mouth at bedtime.    . dofetilide (TIKOSYN) 500 MCG capsule TAKE 1 CAPSULE (500 MCG TOTAL) BY MOUTH 2 (TWO) TIMES DAILY. 60 capsule 6  . DULoxetine (CYMBALTA) 30 MG capsule TAKE 1 CAPSULE BY MOUTH EVERY DAY 90 capsule 2  . ezetimibe (ZETIA) 10 MG tablet TAKE 1 TABLET BY MOUTH  DAILY 90 tablet 3  . famotidine (PEPCID) 20 MG tablet TAKE 1 TABLET BY MOUTH  TWICE DAILY 180 tablet 3  . furosemide (LASIX) 20 MG tablet TAKE 1 TABLET BY MOUTH  DAILY 90 tablet 3  . loratadine (CLARITIN) 10 MG tablet Take 1 tablet (10 mg total) by mouth daily. 30 tablet 11  . olmesartan (BENICAR) 5 MG tablet Take 1 tablet (5 mg total) by mouth daily. 90 tablet 3  . Potassium Chloride ER 20 MEQ TBCR Take 20 mEq by mouth 2 (two) times daily. 180 tablet 1  . XARELTO 20  MG TABS tablet TAKE 1 TABLET (20 MG TOTAL) BY MOUTH DAILY WITH SUPPER. 30 tablet 5   No current facility-administered medications for this encounter.    Allergies  Allergen Reactions  . Augmentin [Amoxicillin-Pot Clavulanate] Other (See Comments)    c-diff Has patient had a PCN reaction causing immediate rash, facial/tongue/throat swelling, SOB or lightheadedness with hypotension: No Has patient had a PCN reaction causing severe rash involving mucus membranes or skin necrosis: No Has patient had a PCN reaction that required hospitalization: No Has patient had a  PCN reaction occurring within the last 10 years: Yes If all of the above answers are "NO", then may proceed with Cephalosporin use.   . Codeine Palpitations and Rash  . Prednisone Other (See Comments)    Jittery, red in the face    Social History   Socioeconomic History  . Marital status: Widowed    Spouse name: Not on file  . Number of children: 2  . Years of education: 56  . Highest education level: Not on file  Occupational History  . Occupation: Retired    Comment: Personnel officer  Tobacco Use  . Smoking status: Never Smoker  . Smokeless tobacco: Never Used  Vaping Use  . Vaping Use: Never used  Substance and Sexual Activity  . Alcohol use: No    Alcohol/week: 0.0 standard drinks  . Drug use: No  . Sexual activity: Not Currently  Other Topics Concern  . Not on file  Social History Narrative   Lives alone   Caffeine use: Soda/tea daily   Social Determinants of Health   Financial Resource Strain:   . Difficulty of Paying Living Expenses:   Food Insecurity:   . Worried About Charity fundraiser in the Last Year:   . Arboriculturist in the Last Year:   Transportation Needs:   . Film/video editor (Medical):   Marland Kitchen Lack of Transportation (Non-Medical):   Physical Activity:   . Days of Exercise per Week:   . Minutes of Exercise per Session:   Stress:   . Feeling of Stress :   Social Connections:   . Frequency of Communication with Friends and Family:   . Frequency of Social Gatherings with Friends and Family:   . Attends Religious Services:   . Active Member of Clubs or Organizations:   . Attends Archivist Meetings:   Marland Kitchen Marital Status:   Intimate Partner Violence:   . Fear of Current or Ex-Partner:   . Emotionally Abused:   Marland Kitchen Physically Abused:   . Sexually Abused:     Family History  Problem Relation Age of Onset  . Hypertension Mother   . Hyperlipidemia Mother   . Neurologic Disorder Mother 20       GB  . Stroke Son   . Stroke  Maternal Uncle   . Stroke Grandchild   . Prostate cancer Brother     ROS- All systems are reviewed and negative except as per the HPI above  Physical Exam: Vitals:   05/28/20 1101  BP: 132/70  Pulse: 63  Weight: 127.6 kg  Height: 5\' 6"  (1.676 m)   Wt Readings from Last 3 Encounters:  05/28/20 127.6 kg  02/19/20 129.3 kg  11/28/19 128.1 kg    Labs: Lab Results  Component Value Date   NA 138 05/28/2020   K 4.5 05/28/2020   CL 103 05/28/2020   CO2 25 05/28/2020   GLUCOSE 117 (H) 05/28/2020   BUN  14 05/28/2020   CREATININE 0.81 05/28/2020   CALCIUM 9.3 05/28/2020   MG 2.2 05/28/2020   Lab Results  Component Value Date   INR 1.10 02/26/2016   Lab Results  Component Value Date   CHOL 178 02/19/2020   HDL 62 02/19/2020   LDLCALC 93 02/19/2020   TRIG 134 02/19/2020     GEN- The patient is well appearing, alert and oriented x 3 today.   Head- normocephalic, atraumatic Eyes-  Sclera clear, conjunctiva pink Ears- hearing intact Oropharynx- clear Neck- supple, no JVP Lymph- no cervical lymphadenopathy Lungs- Clear to ausculation bilaterally, normal work of breathing Heart- regular rate and rhythm, no murmurs, rubs or gallops, PMI not laterally displaced GI- soft, NT, ND, + BS Extremities- no clubbing, cyanosis, trace LLE MS- no significant deformity or atrophy Skin- no rash or lesion Psych- euthymic mood, full affect Neuro- strength and sensation are intact  EKG- SR at 63  bpm,  PR int 202  ms, qrs int 78 ms, qtc 435 ms Echo-Study Conclusions  - Left ventricle: The cavity size was normal. There was mild   concentric hypertrophy. Systolic function was normal. The   estimated ejection fraction was in the range of 60% to 65%. Wall   motion was normal; there were no regional wall motion   abnormalities. - Aortic valve: Transvalvular velocity was within the normal range.   There was no stenosis. There was no regurgitation. - Aorta: Ascending aortic diameter:  38 mm (S). - Ascending aorta: The ascending aorta was mildly dilated. - Mitral valve: Mildly calcified annulus. Transvalvular velocity   was within the normal range. There was no evidence for stenosis.   There was mild regurgitation. - Left atrium: The atrium was mildly dilated.LAD 57 mm, volume 78 ml - Right ventricle: The cavity size was normal. Wall thickness was   normal. Systolic function was normal. - Atrial septum: No defect or patent foramen ovale was identified. - Tricuspid valve: There was mild-moderate regurgitation. - Pulmonic valve: There was moderate regurgitation. - Pulmonary arteries: Systolic pressure was severely increased. PA   peak pressure: 69 mm Hg (S).    Assessment and Plan: 1. Persistent afib Maintaining  SR on Tikosyn 500 mcg bid qtc stable Continue xarelto 20 mg daily for CHA2DS2VASc of at least 5  Bmet/mag today   2. HTN Stable   F/u with afib clinic in 6 months   Butch Penny C. Khoa Opdahl, [facility] [address] [place], [place] [address] [phone]

## 2020-05-29 ENCOUNTER — Encounter: Payer: Self-pay | Admitting: Family Medicine

## 2020-05-29 ENCOUNTER — Ambulatory Visit (INDEPENDENT_AMBULATORY_CARE_PROVIDER_SITE_OTHER): Payer: Medicare Other | Admitting: Family Medicine

## 2020-05-29 VITALS — BP 134/62 | HR 67 | Temp 97.6°F | Ht 66.0 in | Wt 281.0 lb

## 2020-05-29 DIAGNOSIS — I10 Essential (primary) hypertension: Secondary | ICD-10-CM | POA: Diagnosis not present

## 2020-05-29 DIAGNOSIS — Z7901 Long term (current) use of anticoagulants: Secondary | ICD-10-CM

## 2020-05-29 DIAGNOSIS — J069 Acute upper respiratory infection, unspecified: Secondary | ICD-10-CM

## 2020-05-29 DIAGNOSIS — I480 Paroxysmal atrial fibrillation: Secondary | ICD-10-CM | POA: Diagnosis not present

## 2020-05-29 NOTE — Patient Instructions (Signed)
Potassium and magnesium were both normal yesterday.  Continue current BP medication regimen.  Ok to see me in 6 months unless you need me sooner.

## 2020-05-29 NOTE — Progress Notes (Signed)
Subjective: CC: HTN, Afib PCP: [name], DO [id number] [name] Collier is a 74 y.o. female presenting to clinic today for:  1.  Hypertension/atrial fibrillation/ URI Patient reports compliance with furosemide 20 mg daily, Norvasc 5 (reduced last visit for LE edema), Benicar 5mg  added for BP.  She notes that the swelling resolved in the lower extremities with the reduction back to 5 mg of Norvasc.  Her blood pressures have been well controlled.  She is been feeling well.  No chest pain, shortness of breath.  Rare heart palpitations.  She continues to be followed up by A. fib clinic and is compliant with her Xarelto and Tikosyn.  She had labs done yesterday and wants to know the results.  She is had some occasional cramping in the lower extremities so she was worried about potassium potentially being low.  She is however compliant with her potassium pill.  No hematochezia, melena, hematuria or abnormal vaginal bleeding.  Her URI symptoms are improving after steroid burst and as needed use of Tessalon Perles.  No fevers.  Mild cough that is improving.  No hemoptysis.  She is Covid vaccinated.   ROS: Per HPI  Allergies  Allergen Reactions  . Augmentin [Amoxicillin-Pot Clavulanate] Other (See Comments)    c-diff Has patient had a PCN reaction causing immediate rash, facial/tongue/throat swelling, SOB or lightheadedness with hypotension: No Has patient had a PCN reaction causing severe rash involving mucus membranes or skin necrosis: No Has patient had a PCN reaction that required hospitalization: No Has patient had a PCN reaction occurring within the last 10 years: Yes If all of the above answers are "NO", then may proceed with Cephalosporin use.   . Codeine Palpitations and Rash  . Prednisone Other (See Comments)    Jittery, red in the face   Past Medical History:  Diagnosis Date  . GERD (gastroesophageal reflux disease)   . HTN (hypertension)   . Hyperlipidemia   . TIA (transient  ischemic attack)     Current Outpatient Medications:  .  acetaminophen (TYLENOL) 500 MG tablet, Take 500 mg by mouth every 6 (six) hours as needed for moderate pain or headache. , Disp: , Rfl:  .  albuterol (VENTOLIN HFA) 108 (90 Base) MCG/ACT inhaler, Inhale 2 puffs into the lungs every 6 (six) hours as needed for wheezing or shortness of breath., Disp: 6.7 g, Rfl: 0 .  amLODipine (NORVASC) 5 MG tablet, Take 1 tablet (5 mg total) by mouth daily., Disp: 90 tablet, Rfl: 3 .  benzonatate (TESSALON) 200 MG capsule, Take 1 capsule (200 mg total) by mouth 3 (three) times daily as needed for cough., Disp: 30 capsule, Rfl: 0 .  Cholecalciferol (VITAMIN D3) 5000 units CAPS, Take 5,000 Units by mouth at bedtime., Disp: , Rfl:  .  dofetilide (TIKOSYN) 500 MCG capsule, TAKE 1 CAPSULE (500 MCG TOTAL) BY MOUTH 2 (TWO) TIMES DAILY., Disp: 60 capsule, Rfl: 6 .  DULoxetine (CYMBALTA) 30 MG capsule, TAKE 1 CAPSULE BY MOUTH EVERY DAY, Disp: 90 capsule, Rfl: 2 .  ezetimibe (ZETIA) 10 MG tablet, TAKE 1 TABLET BY MOUTH  DAILY, Disp: 90 tablet, Rfl: 3 .  famotidine (PEPCID) 20 MG tablet, TAKE 1 TABLET BY MOUTH  TWICE DAILY, Disp: 180 tablet, Rfl: 3 .  furosemide (LASIX) 20 MG tablet, TAKE 1 TABLET BY MOUTH  DAILY, Disp: 90 tablet, Rfl: 3 .  loratadine (CLARITIN) 10 MG tablet, Take 1 tablet (10 mg total) by mouth daily., Disp: 30 tablet, Rfl: 11 .  olmesartan (BENICAR) 5 MG tablet, Take 1 tablet (5 mg total) by mouth daily., Disp: 90 tablet, Rfl: 3 .  Potassium Chloride ER 20 MEQ TBCR, Take 20 mEq by mouth 2 (two) times daily., Disp: 180 tablet, Rfl: 1 .  XARELTO 20 MG TABS tablet, TAKE 1 TABLET (20 MG TOTAL) BY MOUTH DAILY WITH SUPPER., Disp: 30 tablet, Rfl: 5 Social History   Socioeconomic History  . Marital status: Widowed    Spouse name: Not on file  . Number of children: 2  . Years of education: 84  . Highest education level: Not on file  Occupational History  . 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 Smoking status: Never Smoker  . Smokeless tobacco: Never Used  Vaping Use  . Vaping Use: Never used  Substance and Sexual Activity  . Alcohol use: No    Alcohol/week: 0.0 standard drinks  . Drug use: No  . Sexual activity: Not Currently  Other Topics Concern  . Not on file  Social History Narrative   Lives alone   Caffeine use: Soda/tea daily   Social Determinants of Health   Financial Resource Strain:   . Difficulty of Paying Living Expenses:   Food Insecurity:   . Worried About Charity fundraiser in the Last Year:   . Arboriculturist in the Last Year:   Transportation Needs:   . Film/video editor (Medical):   Marland Kitchen Lack of Transportation (Non-Medical):   Physical Activity:   . Days of Exercise per Week:   . Minutes of Exercise per Session:   Stress:   . Feeling of Stress :   Social Connections:   . Frequency of Communication with Friends and Family:   . Frequency of Social Gatherings with Friends and Family:   . Attends Religious Services:   . Active Member of Clubs or Organizations:   . Attends Archivist Meetings:   Marland Kitchen Marital Status:   Intimate Partner Violence:   . Fear of Current or Ex-Partner:   . Emotionally Abused:   Marland Kitchen Physically Abused:   . Sexually Abused:    Family History  Problem Relation Age of Onset  . Hypertension Mother   . Hyperlipidemia Mother   . Neurologic Disorder Mother 86       GB  . Stroke Son   . Stroke Maternal Uncle   . Stroke Grandchild   . Prostate cancer Brother     Objective: Office vital signs reviewed. BP 134/62   Pulse 67   Temp 97.6 F (36.4 C)   Ht 5\' 6"  (1.676 m)   Wt 281 lb (127.5 kg)   SpO2 95%   BMI 45.35 kg/m   Physical Examination:  General: Awake, alert, obese, No acute distress HEENT: Normal, sclera white, MMM; no appreciable carotid bruits Cardio: regular rate and rhythm, S1S2 heard, no murmurs appreciated Pulm: clear to auscultation bilaterally, no wheezes,  rhonchi or rales; normal work of breathing on room air Extremities: warm, well perfused, no edema. No cyanosis or clubbing; +2 pulses bilaterally Psych: Mood stable, speech normal, affect appropriate  Depression screen [facility] 2/9 05/29/2020 05/29/2020 04/24/2020  Decreased Interest 0 0 0  Down, Depressed, Hopeless 1 0 0  PHQ - 2 Score 1 0 0  Altered sleeping 1 - -  Tired, decreased energy 1 - -  Change in appetite 2 - -  Feeling bad or failure about yourself  2 - -  Trouble concentrating  1 - -  Moving slowly or fidgety/restless 0 - -  Suicidal thoughts 0 - -  PHQ-9 Score 8 - -  Difficult doing work/chores Somewhat difficult - -   Assessment/ Plan: 74 y.o. female   1. PAF (paroxysmal atrial fibrillation) (HCC) Rate and rhythm controlled.  No red flags for source of bleeding is concerned  2. Chronic anticoagulation  3. Essential hypertension Well-controlled with Benicar and Norvasc.  Edema has resolved.  I reviewed her labs with her and they were within normal limits.  Encourage p.o. hydration.  4. URI with cough and congestion Resolving.  No evidence of bacterial infection.  Doubt COVID-19 infection given Covid vaccination status.  She understands return precautions and will follow up as needed.  Okay to use plain guaifenesin if needed for any chest congestion.  Clear fluids.   Okay to follow-up in 6 months, sooner if needed.    No orders of the defined types were placed in this encounter.  No orders of the defined types were placed in this encounter.    [name], DO [place] [phone]

## 2020-06-11 ENCOUNTER — Other Ambulatory Visit: Payer: Self-pay | Admitting: Family Medicine

## 2020-06-11 DIAGNOSIS — J40 Bronchitis, not specified as acute or chronic: Secondary | ICD-10-CM

## 2020-06-13 ENCOUNTER — Other Ambulatory Visit: Payer: Self-pay | Admitting: Family Medicine

## 2020-07-06 ENCOUNTER — Other Ambulatory Visit: Payer: Self-pay | Admitting: Nurse Practitioner

## 2020-09-16 DIAGNOSIS — L659 Nonscarring hair loss, unspecified: Secondary | ICD-10-CM | POA: Diagnosis not present

## 2020-10-28 DIAGNOSIS — M170 Bilateral primary osteoarthritis of knee: Secondary | ICD-10-CM | POA: Diagnosis not present

## 2020-11-04 DIAGNOSIS — Z1231 Encounter for screening mammogram for malignant neoplasm of breast: Secondary | ICD-10-CM | POA: Diagnosis not present

## 2020-11-06 DIAGNOSIS — C801 Malignant (primary) neoplasm, unspecified: Secondary | ICD-10-CM

## 2020-11-06 HISTORY — DX: Malignant (primary) neoplasm, unspecified: C80.1

## 2020-11-09 ENCOUNTER — Other Ambulatory Visit: Payer: Self-pay | Admitting: Cardiology

## 2020-11-11 ENCOUNTER — Other Ambulatory Visit: Payer: Self-pay | Admitting: Obstetrics and Gynecology

## 2020-11-11 DIAGNOSIS — N6489 Other specified disorders of breast: Secondary | ICD-10-CM

## 2020-11-12 ENCOUNTER — Other Ambulatory Visit: Payer: Self-pay | Admitting: Family Medicine

## 2020-11-12 DIAGNOSIS — F320 Major depressive disorder, single episode, mild: Secondary | ICD-10-CM

## 2020-11-12 DIAGNOSIS — F411 Generalized anxiety disorder: Secondary | ICD-10-CM

## 2020-11-21 ENCOUNTER — Ambulatory Visit
Admission: RE | Admit: 2020-11-21 | Discharge: 2020-11-21 | Disposition: A | Discharge status: Home / Self Care | Payer: Medicare Other | Source: Ambulatory Visit | Attending: Obstetrics and Gynecology | Admitting: Obstetrics and Gynecology

## 2020-11-21 ENCOUNTER — Other Ambulatory Visit: Payer: Self-pay | Admitting: Obstetrics and Gynecology

## 2020-11-21 ENCOUNTER — Other Ambulatory Visit: Payer: Self-pay

## 2020-11-21 DIAGNOSIS — R922 Inconclusive mammogram: Secondary | ICD-10-CM | POA: Diagnosis not present

## 2020-11-21 DIAGNOSIS — N6489 Other specified disorders of breast: Secondary | ICD-10-CM | POA: Diagnosis not present

## 2020-11-26 ENCOUNTER — Other Ambulatory Visit: Payer: Self-pay

## 2020-11-26 ENCOUNTER — Ambulatory Visit
Admission: RE | Admit: 2020-11-26 | Discharge: 2020-11-26 | Disposition: A | Discharge status: Home / Self Care | Payer: Medicare Other | Source: Ambulatory Visit | Attending: Obstetrics and Gynecology | Admitting: Obstetrics and Gynecology

## 2020-11-26 DIAGNOSIS — N6489 Other specified disorders of breast: Secondary | ICD-10-CM

## 2020-11-26 DIAGNOSIS — R928 Other abnormal and inconclusive findings on diagnostic imaging of breast: Secondary | ICD-10-CM | POA: Diagnosis not present

## 2020-11-26 DIAGNOSIS — Z170 Estrogen receptor positive status [ER+]: Secondary | ICD-10-CM | POA: Diagnosis not present

## 2020-11-26 DIAGNOSIS — C50812 Malignant neoplasm of overlapping sites of left female breast: Secondary | ICD-10-CM | POA: Diagnosis not present

## 2020-11-26 HISTORY — PX: BREAST BIOPSY: SHX20

## 2020-11-27 ENCOUNTER — Telehealth: Payer: Self-pay

## 2020-11-27 ENCOUNTER — Encounter: Payer: Self-pay | Admitting: Family Medicine

## 2020-11-27 ENCOUNTER — Ambulatory Visit (INDEPENDENT_AMBULATORY_CARE_PROVIDER_SITE_OTHER): Payer: Medicare Other | Admitting: Family Medicine

## 2020-11-27 VITALS — BP 167/87 | HR 71 | Temp 98.3°F | Ht 66.0 in | Wt 283.6 lb

## 2020-11-27 DIAGNOSIS — R739 Hyperglycemia, unspecified: Secondary | ICD-10-CM

## 2020-11-27 DIAGNOSIS — D692 Other nonthrombocytopenic purpura: Secondary | ICD-10-CM | POA: Diagnosis not present

## 2020-11-27 DIAGNOSIS — Z7901 Long term (current) use of anticoagulants: Secondary | ICD-10-CM | POA: Diagnosis not present

## 2020-11-27 DIAGNOSIS — I480 Paroxysmal atrial fibrillation: Secondary | ICD-10-CM

## 2020-11-27 DIAGNOSIS — I10 Essential (primary) hypertension: Secondary | ICD-10-CM | POA: Diagnosis not present

## 2020-11-27 LAB — BAYER DCA HB A1C WAIVED: HB A1C (BAYER DCA - WAIVED): 5.8 % (ref <7.0)

## 2020-11-27 NOTE — Telephone Encounter (Signed)
Pt states that her results from the breast biopsy are able to be viewed in her chart. Informed that Dr. Darnell Level would call if needed. Pt did not mention any concerns.

## 2020-11-27 NOTE — Progress Notes (Signed)
Subjective: CC: Follow-up A. fib, hyperlipidemia, hypertension PCP: [name], DO ULA:GTXMI [name] Collier is a 74 y.o. female presenting to clinic today for:  1.  Hypertension with hyperlipidemia, atrial fibrillation Patient is compliant with all of her medications.  She denies any hematochezia, hematuria, epistaxis.  She does have easy bruising.  She denies any chest pain or shortness of breath.  No abdominal pain.  No heart palpitations or tachycardia.  She has not taken her blood pressure medicine this morning because she forgot.   ROS: Per HPI  Allergies  Allergen Reactions   Augmentin [Amoxicillin-Pot Clavulanate] Other (See Comments)    c-diff Has patient had a PCN reaction causing immediate rash, facial/tongue/throat swelling, SOB or lightheadedness with hypotension: No Has patient had a PCN reaction causing severe rash involving mucus membranes or skin necrosis: No Has patient had a PCN reaction that required hospitalization: No Has patient had a PCN reaction occurring within the last 10 years: Yes If all of the above answers are "NO", then may proceed with Cephalosporin use.    Codeine Palpitations and Rash   Prednisone Other (See Comments)    Jittery, red in the face   Past Medical History:  Diagnosis Date   GERD (gastroesophageal reflux disease)    HTN (hypertension)    Hyperlipidemia    TIA (transient ischemic attack)     Current Outpatient Medications:    acetaminophen (TYLENOL) 500 MG tablet, Take 500 mg by mouth every 6 (six) hours as needed for moderate pain or headache. , Disp: , Rfl:    albuterol (VENTOLIN HFA) 108 (90 Base) MCG/ACT inhaler, TAKE 2 PUFFS BY MOUTH EVERY 6 HOURS AS NEEDED FOR WHEEZE OR SHORTNESS OF BREATH, Disp: 6.7 g, Rfl: 0   amLODipine (NORVASC) 5 MG tablet, Take 1 tablet (5 mg total) by mouth daily., Disp: 90 tablet, Rfl: 3   benzonatate (TESSALON) 200 MG capsule, Take 1 capsule (200 mg total) by mouth 3 (three) times daily  as needed for cough., Disp: 30 capsule, Rfl: 0   Cholecalciferol (VITAMIN D3) 5000 units CAPS, Take 5,000 Units by mouth at bedtime., Disp: , Rfl:    dofetilide (TIKOSYN) 500 MCG capsule, TAKE 1 CAPSULE (500 MCG TOTAL) BY MOUTH 2 (TWO) TIMES DAILY., Disp: 60 capsule, Rfl: 6   DULoxetine (CYMBALTA) 30 MG capsule, TAKE 1 CAPSULE BY MOUTH  DAILY, Disp: 90 capsule, Rfl: 0   ezetimibe (ZETIA) 10 MG tablet, TAKE 1 TABLET BY MOUTH  DAILY, Disp: 90 tablet, Rfl: 3   famotidine (PEPCID) 20 MG tablet, TAKE 1 TABLET BY MOUTH  TWICE DAILY, Disp: 180 tablet, Rfl: 3   furosemide (LASIX) 20 MG tablet, TAKE 1 TABLET BY MOUTH  DAILY, Disp: 90 tablet, Rfl: 3   loratadine (CLARITIN) 10 MG tablet, Take 1 tablet (10 mg total) by mouth daily., Disp: 30 tablet, Rfl: 11   olmesartan (BENICAR) 5 MG tablet, Take 1 tablet (5 mg total) by mouth daily., Disp: 90 tablet, Rfl: 3   Potassium Chloride ER 20 MEQ TBCR, Take 20 mEq by mouth 2 (two) times daily., Disp: 180 tablet, Rfl: 1   potassium chloride SA (KLOR-CON) 20 MEQ tablet, TAKE 1 TABLET BY MOUTH  TWICE DAILY, Disp: 180 tablet, Rfl: 3   XARELTO 20 MG TABS tablet, TAKE 1 TABLET (20 MG TOTAL) BY MOUTH DAILY WITH SUPPER., Disp: 90 tablet, Rfl: 1 Social History   Socioeconomic History   Marital status: Widowed    Spouse name: Not on file   Number  of children: 2   Years of education: 12   Highest education level: Not on file  Occupational History   Occupation: Retired    Comment: Personnel officer  Tobacco Use   Smoking status: Never Smoker   Smokeless tobacco: Never Used  Scientific laboratory technician Use: Never used  Substance and Sexual Activity   Alcohol use: No    Alcohol/week: 0.0 standard drinks   Drug use: No   Sexual activity: Not Currently  Other Topics Concern   Not on file  Social History Narrative   Lives alone   Caffeine use: Soda/tea daily   Social Determinants of Health   Financial Resource Strain: Not on file  Food Insecurity:  Not on file  Transportation Needs: Not on file  Physical Activity: Not on file  Stress: Not on file  Social Connections: Not on file  Intimate Partner Violence: Not on file   Family History  Problem Relation Age of Onset   Hypertension Mother    Hyperlipidemia Mother    Neurologic Disorder Mother 35       GB   Stroke Son    Stroke Maternal Uncle    Stroke Grandchild    Prostate cancer Brother     Objective: Office vital signs reviewed. BP (!) 167/87    Pulse 71    Temp 98.3 F (36.8 C)    Ht _0  (1.676 m)    Wt 283 lb 9.6 oz (128.6 kg)    SpO2 96%    BMI 45.77 kg/m   Physical Examination:  General: Awake, alert, well nourished, No acute distress HEENT: Normal, sclera white, MMM Cardio: regular rate and rhythm, S1S2 heard, no murmurs appreciated Pulm: clear to auscultation bilaterally, no wheezes, rhonchi or rales; normal work of breathing on room air Extremities: warm, well perfused, No edema, cyanosis or clubbing; +2 pulses bilaterally MSK: normal gait and station Skin: Senile purpura and ecchymosis noted along the left upper extremity  Assessment/ Plan: 74 y.o. female   Essential hypertension - Plan: Lipid panel, Lipid panel  PAF (paroxysmal atrial fibrillation) (HCC) - Plan: CMP14+EGFR, Lipid panel, Thyroid Panel With TSH, Thyroid Panel With TSH, Lipid panel, CMP14+EGFR  Chronic anticoagulation - Plan: CBC, CBC  Elevated serum glucose - Plan: Bayer DCA Hb A1c Waived, Bayer DCA Hb A1c Waived  Purpura senilis (HCC)  Check fasting labs.  Blood pressure is not controlled but she did not take her medication this morning.  I will see if she can follow-up in the next week or so with [facility] for medication assistance with Xarelto and at that time if they could check a blood pressure that would be fantastic.  She is not having any red flag signs or symptoms with her coagulation.  Have encouraged her to continue this.  Sugar was noted to be elevated previously  therefore A1c was collected  Progressing Lissa Merlin is likely secondary to the chronic anticoagulation.  No orders of the defined types were placed in this encounter.  No orders of the defined types were placed in this encounter.    [name], DO [place] [phone]

## 2020-11-27 NOTE — Telephone Encounter (Signed)
I tried to reach patient about biopsy results.  There was no answer. I did not leave a VM given the sensitive nature of these results.  I expect that she will receive a call from the breast clinic tomorrow with results.

## 2020-11-28 ENCOUNTER — Other Ambulatory Visit: Payer: Self-pay | Admitting: Family Medicine

## 2020-11-28 LAB — CMP14+EGFR
ALT: 13 IU/L (ref 0–32)
AST: 18 IU/L (ref 0–40)
Albumin/Globulin Ratio: 1.4 (ref 1.2–2.2)
Albumin: 3.8 g/dL (ref 3.7–4.7)
Alkaline Phosphatase: 85 IU/L (ref 44–121)
BUN/Creatinine Ratio: 18 (ref 12–28)
BUN: 15 mg/dL (ref 8–27)
Bilirubin Total: 1 mg/dL (ref 0.0–1.2)
CO2: 23 mmol/L (ref 20–29)
Calcium: 9.2 mg/dL (ref 8.7–10.3)
Chloride: 105 mmol/L (ref 96–106)
Creatinine, Ser: 0.83 mg/dL (ref 0.57–1.00)
GFR calc Af Amer: 80 mL/min/{1.73_m2} (ref >59)
GFR calc non Af Amer: 70 mL/min/{1.73_m2} (ref >59)
Globulin, Total: 2.8 g/dL (ref 1.5–4.5)
Glucose: 99 mg/dL (ref 65–99)
Potassium: 4.3 mmol/L (ref 3.5–5.2)
Sodium: 141 mmol/L (ref 134–144)
Total Protein: 6.6 g/dL (ref 6.0–8.5)

## 2020-11-28 LAB — LIPID PANEL
Chol/HDL Ratio: 2.9 ratio (ref 0.0–4.4)
Cholesterol, Total: 180 mg/dL (ref 100–199)
HDL: 62 mg/dL (ref >39)
LDL Chol Calc (NIH): 101 mg/dL — ABNORMAL HIGH (ref 0–99)
Triglycerides: 91 mg/dL (ref 0–149)
VLDL Cholesterol Cal: 17 mg/dL (ref 5–40)

## 2020-11-28 LAB — CBC
Hematocrit: 40.5 % (ref 34.0–46.6)
Hemoglobin: 13.4 g/dL (ref 11.1–15.9)
MCH: 29.3 pg (ref 26.6–33.0)
MCHC: 33.1 g/dL (ref 31.5–35.7)
MCV: 88 fL (ref 79–97)
Platelets: 269 10*3/uL (ref 150–450)
RBC: 4.58 x10E6/uL (ref 3.77–5.28)
RDW: 12.7 % (ref 11.7–15.4)
WBC: 5.1 10*3/uL (ref 3.4–10.8)

## 2020-11-28 LAB — THYROID PANEL WITH TSH
Free Thyroxine Index: 2.9 (ref 1.2–4.9)
T3 Uptake Ratio: 28 % (ref 24–39)
T4, Total: 10.2 ug/dL (ref 4.5–12.0)
TSH: 4.21 u[IU]/mL (ref 0.450–4.500)

## 2020-11-28 NOTE — Telephone Encounter (Signed)
Pt was aware of results at the time of our conversation. Just wanted to make Dr. Darnell Level aware that she could view the results at this time since during her visit here they were not resulted.

## 2020-12-05 ENCOUNTER — Other Ambulatory Visit: Payer: Self-pay | Admitting: Family Medicine

## 2020-12-05 ENCOUNTER — Ambulatory Visit: Payer: Medicare Other | Admitting: Pharmacist

## 2020-12-11 ENCOUNTER — Ambulatory Visit (HOSPITAL_COMMUNITY): Payer: Medicare Other | Admitting: Nurse Practitioner

## 2020-12-11 ENCOUNTER — Other Ambulatory Visit: Payer: Self-pay | Admitting: Family Medicine

## 2020-12-16 ENCOUNTER — Ambulatory Visit: Payer: Self-pay | Admitting: Surgery

## 2020-12-16 ENCOUNTER — Telehealth: Payer: Self-pay | Admitting: *Deleted

## 2020-12-16 DIAGNOSIS — C50912 Malignant neoplasm of unspecified site of left female breast: Secondary | ICD-10-CM

## 2020-12-16 NOTE — Telephone Encounter (Signed)
Patient with diagnosis of afib on Xarelto for anticoagulation.     Procedure: LEFT BREAST LUMPECTOMY and SLN  Date of procedure: TBD    CHA2DS2-VASc Score = 5  This indicates a 7.2% annual risk of stroke. The patient's score is based upon: CHF History: No HTN History: Yes Diabetes History: No Stroke History: Yes Vascular Disease History: No Age Score: 1 Gender Score: 1      CrCl 81 ml/min  Would typically recommend a 2 day hold, however patient is high risk off anticoagulation due to hx of TIA. Will defer to MD

## 2020-12-16 NOTE — H&P (View-Only) (Signed)
History of Present Illness [name] Collier. [name] Bun MD; 12/16/2020 12:32 PM) The patient is a 75 year old female who presents with breast cancer. PCP - Dr. Liston Alba Referred by Dr. Paula Compton for left breast cancer  This is a 75 year old female with atrial fibrillation anticoagulated on Xarelto. Presents with recent diagnosis of a left breast mass. This was biopsied and revealed invasive lobular carcinoma, ER/PR positive, HER-2/neu negative, Ki-67 10%. The mass is located in the left breast at 10:00 1 cm from the nipple and measures 0.4 cm in diameter. She presents now for her initial surgical evaluation.  Her cardiologist is Dr. Jeneen Rinks all red. She has an appointment scheduled to see him later this week.   Problem List/Past Medical Rodman Key K. Liane Tribbey, MD; 12/16/2020 12:33 PM) ANAL FISSURE (K60.2) [name] is a very pleasant 49yoF with hx of HTN, GERD, colon polyps, afib (on Eliquis - follows with Dr. Rayann Heman) s/p EUA/botox for posterior midline anal fissure 12/2018 - here for long-term f/u INVASIVE LOBULAR CARCINOMA OF LEFT BREAST, STAGE 1 (N82.956)  Past Surgical History Rodman Key K. Merrill Villarruel, MD; 12/16/2020 12:33 PM) Cataract Surgery Bilateral. Colon Polyp Removal - Colonoscopy Hysterectomy (not due to cancer) - Partial Knee Surgery Bilateral. Tonsillectomy  Diagnostic Studies History (Jed Kutch K. Antario Yasuda, MD; 12/16/2020 12:33 PM) Colonoscopy 1-5 years ago Mammogram 1-3 years ago Pap Smear 1-5 years ago  Allergies ([name], CMA; 12/16/2020 11:42 AM) Augmentin *PENICILLINS* Diarrhea. Allergies Reconciled  Medication History [name] Collier. Christene Pounds, MD; 12/16/2020 12:33 PM) AmLODIPine Besylate (2.5MG  Tablet, Oral) Active. Ezetimibe (10MG  Tablet, Oral) Active. Furosemide (20MG  Tablet, Oral) Active. DULoxetine HCl (30MG  Capsule DR Part, Oral) Active. Xarelto (20MG  Tablet, Oral) Active. Dofetilide (500MCG Capsule, Oral) Active. Potassium Chloride ER (20MEQ Tablet ER,  Oral) Active. Famotidine (20MG  Tablet, Oral) Active. Benicar (Oral) Specific strength unknown - Active. Medications Reconciled Aspirin (81MG  Tablet DR, Oral) Active. Vitamin B-12 (1000MCG Tablet, Oral) Active. Vitamin D3 (5000UNIT Tablet, Oral) Active.  Social History [name] Collier. [name] Roesler, MD; 12/16/2020 12:33 PM) No drug use Caffeine use Tea. No alcohol use Tobacco use Never smoker.  Family History [name] Collier. [name] Villamizar, MD; 12/16/2020 12:33 PM) Arthritis Mother. Melanoma Brother. Colon Cancer Brother. Colon Polyps Sister. Hypertension Mother. Migraine Headache Sister. Prostate Cancer Brother.  Pregnancy / Birth History [name] Collier. [name] Cormier, MD; 12/16/2020 12:33 PM) Maternal age 33-25 Age at menarche 82 years. Age of menopause <45 Gravida 2 Para 2  Other Problems [name] Collier. Lavel Rieman, MD; 12/16/2020 12:33 PM) Atrial Fibrillation Arthritis Bladder Problems Cerebrovascular Accident Depression Gastroesophageal Reflux Disease Hemorrhoids High blood pressure     Review of Systems ([name]; 12/16/2020 11:42 AM) Breast Not Present- Breast Mass, Breast Pain, Nipple Discharge and Skin Changes. Female Genitourinary Present- Urgency. Not Present- Frequency, Nocturia, Painful Urination and Pelvic Pain. Musculoskeletal Not Present- Back Pain, Joint Pain, Joint Stiffness, Muscle Pain, Muscle Weakness and Swelling of Extremities.  Vitals ([name]; 12/16/2020 11:43 AM) 12/16/2020 11:43 AM Weight: 286.38 lb Height: 65in Body Surface Area: 2.3 m Body Mass Index: 47.65 kg/m  Temp.: 95.31F  Pulse: 80 (Regular)  BP: 128/76(Sitting, Left Arm, Standard)        Physical Exam Rodman Key K. Livan Hires MD; 12/16/2020 12:33 PM)  The physical exam findings are as follows: Note:Constitutional: WDWN in NAD, conversant, no obvious deformities; resting comfortably Eyes: Pupils equal, round; sclera anicteric; moist conjunctiva; no lid lag HENT: Oral  mucosa moist; good dentition Neck: No masses palpated, trachea midline; no thyromegaly Lungs: CTA bilaterally; normal respiratory effort Breasts: Symmetric, no nipple  retraction or discharge, no axillary lymphadenopathy on either side, no palpable masses in either breast. CV: Regular rate and rhythm; no murmurs; extremities well-perfused with no edema Abd: +bowel sounds, soft, non-tender, no palpable organomegaly; no palpable hernias Musc: Normal gait; no apparent clubbing or cyanosis in extremities Lymphatic: No palpable cervical or axillary lymphadenopathy Skin: Warm, dry; no sign of jaundice Psychiatric - alert and oriented x 4; calm mood and affect    Assessment & Plan Rodman Key K. Malkia Nippert MD; 12/16/2020 12:25 PM)  INVASIVE LOBULAR CARCINOMA OF LEFT BREAST, STAGE 1 (C50.912)  Current Plans Schedule for Surgery - Left radioactive seed localized lumpectomy/ left axillary sentinel lymph node biopsy/ blue dye injection. The surgical procedure has been discussed with the patient. Potential risks, benefits, alternative treatments, and expected outcomes have been explained. All of the patient's questions at this time have been answered. The likelihood of reaching the patient's treatment goal is good. The patient understand the proposed surgical procedure and wishes to proceed.  [name] Collier. Georgette Dover, MD, [facility] Surgery  General/ Trauma Surgery   12/16/2020 12:34 PM

## 2020-12-16 NOTE — Telephone Encounter (Signed)
Hold xarelto 48 hours prior to the procedure and resume as soon as able post operatively.

## 2020-12-16 NOTE — Telephone Encounter (Signed)
   [facility] Pre-operative Risk Assessment    HEARTCARE STAFF: - Please ensure there is not already an duplicate clearance open for this procedure. - Under Visit Info/Reason for Call, type in Other and utilize the format Clearance MM/DD/YY or Clearance TBD. Do not use dashes or single digits. - If request is for dental extraction, please clarify the # of teeth to be extracted.  Request for surgical clearance:  1. What type of surgery is being performed? LEFT BREAST LUMPECTOMY and SLN   2. When is this surgery scheduled? TBD   3. What type of clearance is required (medical clearance vs. Pharmacy clearance to hold med vs. Both)? BOTH  4. Are there any medications that need to be held prior to surgery and how long? [place]   5. Practice name and name of physician performing surgery? [facility]; DR. Rodman Key TSUEI   6. What is the office phone number? [phone]   7.   What is the office fax number? [place]: Malachi Bonds, CMA  8.   Anesthesia type (None, local, MAC, general) ? GENERAL   [name] 12/16/2020, 12:43 PM  _________________________________________________________________   (provider comments below)

## 2020-12-16 NOTE — H&P (Signed)
History of Present Illness [name] Collier. [name] Tlatelpa MD; 12/16/2020 12:32 PM) The patient is a 75 year old female who presents with breast cancer. PCP - Dr. Liston Collier Referred by Dr. Paula Collier for left breast cancer  This is a 75 year old female with atrial fibrillation anticoagulated on Xarelto. Presents with recent diagnosis of a left breast mass. This was biopsied and revealed invasive lobular carcinoma, ER/PR positive, HER-2/neu negative, Ki-67 10%. The mass is located in the left breast at 10:00 1 cm from the nipple and measures 0.4 cm in diameter. She presents now for her initial surgical evaluation.  Her cardiologist is Dr. Jeneen Collier all red. She has an appointment scheduled to see him later this week.   Problem List/Past Medical [name] Key K. Dann Ventress, MD; 12/16/2020 12:33 PM) ANAL FISSURE (K60.2) Ms. Denslow is a very pleasant 34yoF with hx of HTN, GERD, colon polyps, afib (on Eliquis - follows with Dr. Rayann Heman) s/p EUA/botox for posterior midline anal fissure 12/2018 - here for long-term f/u INVASIVE LOBULAR CARCINOMA OF LEFT BREAST, STAGE 1 (I69.629)  Past Surgical History [name] Key K. Rey Fors, MD; 12/16/2020 12:33 PM) Cataract Surgery Bilateral. Colon Polyp Removal - Colonoscopy Hysterectomy (not due to cancer) - Partial Knee Surgery Bilateral. Tonsillectomy  Diagnostic Studies History ([name] Collier K. Rilea Arutyunyan, MD; 12/16/2020 12:33 PM) Colonoscopy 1-5 years ago Mammogram 1-3 years ago Pap Smear 1-5 years ago  Allergies ([name] Collier, Collier; 12/16/2020 11:42 AM) Augmentin *PENICILLINS* Diarrhea. Allergies Reconciled  Medication History [name] Collier. [name] Shuck, MD; 12/16/2020 12:33 PM) AmLODIPine Besylate (2.5MG  Tablet, Oral) Active. Ezetimibe (10MG  Tablet, Oral) Active. Furosemide (20MG  Tablet, Oral) Active. DULoxetine HCl (30MG  Capsule DR Part, Oral) Active. Xarelto (20MG  Tablet, Oral) Active. Dofetilide (500MCG Capsule, Oral) Active. Potassium Chloride ER (20MEQ Tablet ER,  Oral) Active. Famotidine (20MG  Tablet, Oral) Active. Benicar (Oral) Specific strength unknown - Active. Medications Reconciled Aspirin (81MG  Tablet DR, Oral) Active. Vitamin B-12 (1000MCG Tablet, Oral) Active. Vitamin D3 (5000UNIT Tablet, Oral) Active.  Social History [name] Collier. [name] Arave, MD; 12/16/2020 12:33 PM) No drug use Caffeine use Tea. No alcohol use Tobacco use Never smoker.  Family History [name] Collier. [name] Dufrane, MD; 12/16/2020 12:33 PM) Arthritis Mother. Melanoma Brother. Colon Cancer Brother. Colon Polyps Sister. Hypertension Mother. Migraine Headache Sister. Prostate Cancer Brother.  Pregnancy / Birth History [name] Collier. [name] Weisenburger, MD; 12/16/2020 12:33 PM) Maternal age 21-25 Age at menarche 51 years. Age of menopause <45 Gravida 2 Para 2  Other Problems [name] Collier. [name] Pollard, MD; 12/16/2020 12:33 PM) Atrial Fibrillation Arthritis Bladder Problems Cerebrovascular Accident Depression Gastroesophageal Reflux Disease Hemorrhoids High blood pressure     Review of Systems ([name] Collier; 12/16/2020 11:42 AM) Breast Not Present- Breast Mass, Breast Pain, Nipple Discharge and Skin Changes. Female Genitourinary Present- Urgency. Not Present- Frequency, Nocturia, Painful Urination and Pelvic Pain. Musculoskeletal Not Present- Back Pain, Joint Pain, Joint Stiffness, Muscle Pain, Muscle Weakness and Swelling of Extremities.  Vitals ([name] Collier; 12/16/2020 11:43 AM) 12/16/2020 11:43 AM Weight: 286.38 lb Height: 65in Body Surface Area: 2.3 m Body Mass Index: 47.65 kg/m  Temp.: 95.25F  Pulse: 80 (Regular)  BP: 128/76(Sitting, Left Arm, Standard)        Physical Exam [name] Key K. Sandrika Schwinn MD; 12/16/2020 12:33 PM)  The physical exam findings are as follows: Note:Constitutional: WDWN in NAD, conversant, no obvious deformities; resting comfortably Eyes: Pupils equal, round; sclera anicteric; moist conjunctiva; no lid lag HENT: Oral  mucosa moist; good dentition Neck: No masses palpated, trachea midline; no thyromegaly Lungs: CTA bilaterally; normal respiratory effort Breasts: Symmetric, no nipple  retraction or discharge, no axillary lymphadenopathy on either side, no palpable masses in either breast. CV: Regular rate and rhythm; no murmurs; extremities well-perfused with no edema Abd: +bowel sounds, soft, non-tender, no palpable organomegaly; no palpable hernias Musc: Normal gait; no apparent clubbing or cyanosis in extremities Lymphatic: No palpable cervical or axillary lymphadenopathy Skin: Warm, dry; no sign of jaundice Psychiatric - alert and oriented x 4; calm mood and affect    Assessment & Plan [name] Key K. [name] Buttery MD; 12/16/2020 12:25 PM)  INVASIVE LOBULAR CARCINOMA OF LEFT BREAST, STAGE 1 (C50.912)  Current Plans Schedule for Surgery - Left radioactive seed localized lumpectomy/ left axillary sentinel lymph node biopsy/ blue dye injection. The surgical procedure has been discussed with the patient. Potential risks, benefits, alternative treatments, and expected outcomes have been explained. All of the patient's questions at this time have been answered. The likelihood of reaching the patient's treatment goal is good. The patient understand the proposed surgical procedure and wishes to proceed.  [name] Collier. [name] Dover, MD, [facility]  General/ Trauma Surgery   12/16/2020 12:34 PM

## 2020-12-17 NOTE — Telephone Encounter (Signed)
   Primary Cardiologist: Minus Breeding, MD  Chart reviewed as part of pre-operative protocol coverage. Patient was contacted 12/17/2020 in reference to pre-operative risk assessment for pending surgery as outlined below.  [name] Collier was last seen on 05/28/2020 by Roderic Palau NP.  Since that day, [name] Collier has done well without significant shortness of breath and chest pain. She is able to achieve 4 METS of activity.  Therefore, based on ACC/AHA guidelines, the patient would be at acceptable risk for the planned procedure without further cardiovascular testing.   Patient has been instructed to hold his Xarelto for 2 days prior to the procedure and restart soon as possible after the procedure at the surgeon's discretion.  The patient was advised that if she develops new symptoms prior to surgery to contact our office to arrange for a follow-up visit, and she verbalized understanding.  I will route this recommendation to the requesting party via Epic fax function and remove from pre-op pool. Please call with questions.  [place], [place] 12/17/2020, 6:18 PM

## 2020-12-18 ENCOUNTER — Other Ambulatory Visit: Payer: Self-pay | Admitting: Surgery

## 2020-12-18 ENCOUNTER — Other Ambulatory Visit: Payer: Self-pay

## 2020-12-18 DIAGNOSIS — C50912 Malignant neoplasm of unspecified site of left female breast: Secondary | ICD-10-CM

## 2020-12-18 MED ORDER — RIVAROXABAN 20 MG PO TABS: ORAL_TABLET | ORAL | 2 refills | Status: DC

## 2020-12-19 ENCOUNTER — Encounter (HOSPITAL_COMMUNITY): Payer: Self-pay | Admitting: Nurse Practitioner

## 2020-12-19 ENCOUNTER — Other Ambulatory Visit: Payer: Self-pay

## 2020-12-19 ENCOUNTER — Telehealth: Payer: Self-pay | Admitting: Hematology and Oncology

## 2020-12-19 ENCOUNTER — Ambulatory Visit (HOSPITAL_COMMUNITY)
Admission: RE | Admit: 2020-12-19 | Discharge: 2020-12-19 | Disposition: A | Discharge status: Home / Self Care | Payer: Medicare Other | Source: Ambulatory Visit | Attending: Nurse Practitioner | Admitting: Nurse Practitioner

## 2020-12-19 VITALS — BP 166/76 | HR 68 | Ht 66.0 in | Wt 284.2 lb

## 2020-12-19 DIAGNOSIS — I4819 Other persistent atrial fibrillation: Secondary | ICD-10-CM | POA: Diagnosis not present

## 2020-12-19 DIAGNOSIS — D6869 Other thrombophilia: Secondary | ICD-10-CM | POA: Diagnosis not present

## 2020-12-19 DIAGNOSIS — I4510 Unspecified right bundle-branch block: Secondary | ICD-10-CM | POA: Diagnosis not present

## 2020-12-19 DIAGNOSIS — I10 Essential (primary) hypertension: Secondary | ICD-10-CM | POA: Insufficient documentation

## 2020-12-19 DIAGNOSIS — I480 Paroxysmal atrial fibrillation: Secondary | ICD-10-CM

## 2020-12-19 DIAGNOSIS — Z7901 Long term (current) use of anticoagulants: Secondary | ICD-10-CM | POA: Diagnosis not present

## 2020-12-19 LAB — MAGNESIUM: Magnesium: 2 mg/dL (ref 1.7–2.4)

## 2020-12-19 MED ORDER — AMLODIPINE BESYLATE 5 MG PO TABS: 5.0000 mg | ORAL_TABLET | Freq: Every day | ORAL | Status: DC

## 2020-12-19 NOTE — Telephone Encounter (Signed)
Received a new pt referral from Dr. Georgette Dover for new dx of breast cancer. Pt has been cld and scheduled to see Dr. Lindi Adie on 1/19 at 345pm. Pt aware to arrive 15 minutes early.

## 2020-12-19 NOTE — Progress Notes (Signed)
Primary Care Physician: [name], DO Referring Physician: Dr. Radford Pax Cardiologist: Dr. Percival Spanish EP: Dr. [name] Hearing is a 75 y.o. female with a h/o afib, HTN, hyperlipidemia and TIA. She is in the afib clinic for f/u of Tikosyn use. She feels well with NSR today. has not appreciated any afib. Continues on xarelto 20 mg daily with a CHA2DS2VASc of at least 5, without any signs of bleeding.   F/u in afib clinic, 12/20/19. She continues in SR on [place]. Has not noted any afib. She feels well.  She is pending a left  breast lumpectomy 01/07/21. No issues with bleeding on xarelto.   Today, she denies symptoms of palpitations, chest pain, shortness of breath, orthopnea, PND, lower extremity edema, dizziness, presyncope, syncope, or neurologic sequela. The patient is tolerating medications without difficulties and is otherwise without complaint today.   Past Medical History:  Diagnosis Date  . GERD (gastroesophageal reflux disease)   . HTN (hypertension)   . Hyperlipidemia   . TIA (transient ischemic attack)    Past Surgical History:  Procedure Laterality Date  . BOTOX INJECTION N/A 12/22/2018   Procedure: BOTOX INJECTION INTO ANAL SPHINCTER;  Surgeon: Ileana Roup, MD;  Location: [facility] ORS;  Service: General;  Laterality: N/A;  . BREAST BIOPSY Left 11/26/2020  . CARDIOVERSION N/A 01/04/2018   Procedure: CARDIOVERSION;  Surgeon: Dorothy Spark, MD;  Location: [facility] ENDOSCOPY;  Service: Cardiovascular;  Laterality: N/A;  . CARDIOVERSION N/A 09/20/2018   Procedure: CARDIOVERSION;  Surgeon: Sueanne Margarita, MD;  Location: [facility] ENDOSCOPY;  Service: Cardiovascular;  Laterality: N/A;  . CARDIOVERSION N/A 10/27/2018   Procedure: CARDIOVERSION;  Surgeon: Buford Dresser, MD;  Location: [facility] ENDOSCOPY;  Service: Cardiovascular;  Laterality: N/A;  . KNEE SURGERY Bilateral    Torn miniscus  . RECTAL EXAM UNDER ANESTHESIA N/A 12/22/2018   Procedure: ANORECTAL  EXAM UNDER  ANESTHESIA;  Surgeon: Ileana Roup, MD;  Location: [facility] ORS;  Service: General;  Laterality: N/A;  . [place]    . TONSILLECTOMY    . VAGINAL HYSTERECTOMY      Current Outpatient Medications  Medication Sig Dispense Refill  . acetaminophen (TYLENOL) 500 MG tablet Take 500 mg by mouth every 6 (six) hours as needed for moderate pain or headache.     . albuterol (VENTOLIN HFA) 108 (90 Base) MCG/ACT inhaler TAKE 2 PUFFS BY MOUTH EVERY 6 HOURS AS NEEDED FOR WHEEZE OR SHORTNESS OF BREATH 6.7 g 0  . Cholecalciferol (VITAMIN D3) 5000 units CAPS Take 5,000 Units by mouth at bedtime.    . dofetilide (TIKOSYN) 500 MCG capsule TAKE 1 CAPSULE (500 MCG TOTAL) BY MOUTH 2 (TWO) TIMES DAILY. 60 capsule 6  . DULoxetine (CYMBALTA) 30 MG capsule TAKE 1 CAPSULE BY MOUTH  DAILY 90 capsule 0  . ezetimibe (ZETIA) 10 MG tablet TAKE 1 TABLET BY MOUTH  DAILY 90 tablet 1  . famotidine (PEPCID) 20 MG tablet TAKE 1 TABLET BY MOUTH  TWICE DAILY 180 tablet 3  . furosemide (LASIX) 20 MG tablet TAKE 1 TABLET BY MOUTH  DAILY 90 tablet 1  . loratadine (CLARITIN) 10 MG tablet Take 1 tablet (10 mg total) by mouth daily. 30 tablet 11  . olmesartan (BENICAR) 5 MG tablet TAKE 1 TABLET BY MOUTH  DAILY 90 tablet 1  . potassium chloride SA (KLOR-CON) 20 MEQ tablet TAKE 1 TABLET BY MOUTH  TWICE DAILY 180 tablet 3  . rivaroxaban (XARELTO) 20 MG TABS tablet TAKE 1  TABLET (20 MG TOTAL) BY MOUTH DAILY WITH SUPPER. 90 tablet 2  . amLODipine (NORVASC) 5 MG tablet Take 1 tablet (5 mg total) by mouth daily.     No current facility-administered medications for this encounter.    Allergies  Allergen Reactions  . Augmentin [Amoxicillin-Pot Clavulanate] Other (See Comments)    c-diff Has patient had a PCN reaction causing immediate rash, facial/tongue/throat swelling, SOB or lightheadedness with hypotension: No Has patient had a PCN reaction causing severe rash involving mucus membranes or skin necrosis: No Has patient had a  PCN reaction that required hospitalization: No Has patient had a PCN reaction occurring within the last 10 years: Yes If all of the above answers are "NO", then may proceed with Cephalosporin use.   . Codeine Palpitations and Rash  . Prednisone Other (See Comments)    Jittery, red in the face    Social History   Socioeconomic History  . Marital status: Widowed    Spouse name: Not on file  . Number of children: 2  . Years of education: 24  . Highest education level: Not on file  Occupational History  . Occupation: Retired    Comment: Personnel officer  Tobacco Use  . Smoking status: Never Smoker  . Smokeless tobacco: Never Used  Vaping Use  . Vaping Use: Never used  Substance and Sexual Activity  . Alcohol use: No    Alcohol/week: 0.0 standard drinks  . Drug use: No  . Sexual activity: Not Currently  Other Topics Concern  . Not on file  Social History Narrative   Lives alone   Caffeine use: Soda/tea daily   Social Determinants of Health   Financial Resource Strain: Not on file  Food Insecurity: Not on file  Transportation Needs: Not on file  Physical Activity: Not on file  Stress: Not on file  Social Connections: Not on file  Intimate Partner Violence: Not on file    Family History  Problem Relation Age of Onset  . Hypertension Mother   . Hyperlipidemia Mother   . Neurologic Disorder Mother 48       GB  . Stroke Son   . Stroke Maternal Uncle   . Stroke Grandchild   . Prostate cancer Brother     ROS- All systems are reviewed and negative except as per the HPI above  Physical Exam: Vitals:   12/19/20 1132  BP: (!) 166/76  Pulse: 68  Weight: 128.9 kg  Height: 5\' 6"  (1.676 m)   Wt Readings from Last 3 Encounters:  12/19/20 128.9 kg  11/27/20 128.6 kg  05/29/20 127.5 kg    Labs: Lab Results  Component Value Date   NA 141 11/27/2020   K 4.3 11/27/2020   CL 105 11/27/2020   CO2 23 11/27/2020   GLUCOSE 99 11/27/2020   BUN 15 11/27/2020    CREATININE 0.83 11/27/2020   CALCIUM 9.2 11/27/2020   MG 2.0 12/19/2020   Lab Results  Component Value Date   INR 1.10 02/26/2016   Lab Results  Component Value Date   CHOL 180 11/27/2020   HDL 62 11/27/2020   LDLCALC 101 (H) 11/27/2020   TRIG 91 11/27/2020     GEN- The patient is well appearing, alert and oriented x 3 today.   Head- normocephalic, atraumatic Eyes-  Sclera clear, conjunctiva pink Ears- hearing intact Oropharynx- clear Neck- supple, no JVP Lymph- no cervical lymphadenopathy Lungs- Clear to ausculation bilaterally, normal work of breathing Heart- regular rate and rhythm, no  murmurs, rubs or gallops, PMI not laterally displaced GI- soft, NT, ND, + BS Extremities- no clubbing, cyanosis, trace LLE MS- no significant deformity or atrophy Skin- no rash or lesion Psych- euthymic mood, full affect Neuro- strength and sensation are intact  EKG- SR at 68  Bpm, LAD with RBBB (new) compared to previous ekg's ,   PR int 204 ms, qrs int 124 ms, qtc 457 ms (Stable)  Echo- 2018 Study Conclusions  - Left ventricle: The cavity size was normal. There was mild   concentric hypertrophy. Systolic function was normal. The   estimated ejection fraction was in the range of 60% to 65%. Wall   motion was normal; there were no regional wall motion   abnormalities. - Aortic valve: Transvalvular velocity was within the normal range.   There was no stenosis. There was no regurgitation. - Aorta: Ascending aortic diameter: 38 mm (S). - Ascending aorta: The ascending aorta was mildly dilated. - Mitral valve: Mildly calcified annulus. Transvalvular velocity   was within the normal range. There was no evidence for stenosis.   There was mild regurgitation. - Left atrium: The atrium was mildly dilated.LAD 57 mm, volume 78 ml - Right ventricle: The cavity size was normal. Wall thickness was   normal. Systolic function was normal. - Atrial septum: No defect or patent foramen ovale was  identified. - Tricuspid valve: There was mild-moderate regurgitation. - Pulmonic valve: There was moderate regurgitation. - Pulmonary arteries: Systolic pressure was severely increased. PA   peak pressure: 69 mm Hg (S).    Assessment and Plan: 1. Persistent afib Maintaining  SR on Tikosyn 500 mcg bid qtc stable Continue xarelto 20 mg daily for CHA2DS2VASc of at least 5 Mag  Today, recent K+ at 4.3  2. HTN Stable   3. New RBBB  Reviewed with Dr. Rayann Heman He did not think any other w/u was needed in the absence of ischemic symptoms  Will also forward to [facility] that recently evaluated pt for pre op clearance.   F/u with afib clinic in 6 months   Geroge Baseman. [place], [facility] [address] [place], [place] [address] [phone]

## 2020-12-19 NOTE — Progress Notes (Signed)
Location of Breast Cancer: Invasive lobular carcinoma of LEFT breast, stage 1  Histology per Pathology Report:  (definitive pathology pending upcoming lumpectomy) 11/26/2020 Diagnosis Breast, left, needle core biopsy, medial - INVASIVE MAMMARY CARCINOMA - SEE COMMENT Microscopic Comment The biopsy material shows an infiltrative proliferation of cells with large vesicular nuclei with inconspicuous nucleoli, arranged linearly and in small clusters. Based on the biopsy, the carcinoma appears Nottingham grade 1-2 of 3 and measures 0.4 cm in greatest linear extent.  Receptor Status: ER(50%), PR (90%), Her2-neu (Negative), Ki-67(10%)  Did patient present with symptoms (if so, please note symptoms) or was this found on screening mammography?:  Patient presented with left breast abnormality on the following imaging: bilateral screening mammogram on the date of 11/29/021. No symptoms were reported at that time. Ultrasound of the left breast on 11/21/2020 revealed a subtle distortion without a sonographic correlate in the medial to upper inner left breast. There was no evidence of left axillary lymphadenopathy  Past/Anticipated interventions by surgeon, if any: Scheduled for LEFT BREAST LUMPECTOMY WITH RADIOACTIVE SEED AND LEFT AXILLARY SENTINEL LYMPH NODE BIOPSY WITH BLUE DYE INJECTION by Dr. Donnie Mesa on 01/07/2021  Past/Anticipated interventions by medical oncology, if any:  Scheduled for consult with Dr. [name] on 12/25/2020  Lymphedema issues, if any:  Patient denies    Pain issues, if any:  Patient denies    SAFETY ISSUES:  Prior radiation? No  Pacemaker/ICD? No  Possible current pregnancy? No--postmenopausal   Is the patient on methotrexate? No  Current Complaints / other details:   Patient has received the first 2 Moderna vaccines, but not a booster or her annual flu shot

## 2020-12-20 ENCOUNTER — Other Ambulatory Visit: Payer: Self-pay

## 2020-12-20 ENCOUNTER — Ambulatory Visit
Admission: RE | Admit: 2020-12-20 | Discharge: 2020-12-20 | Disposition: A | Discharge status: Home / Self Care | Payer: Medicare Other | Source: Ambulatory Visit | Attending: Radiation Oncology | Admitting: Radiation Oncology

## 2020-12-20 ENCOUNTER — Encounter: Payer: Self-pay | Admitting: Radiation Oncology

## 2020-12-20 ENCOUNTER — Ambulatory Visit: Payer: Medicare Other

## 2020-12-20 ENCOUNTER — Ambulatory Visit: Payer: Medicare Other | Admitting: Radiation Oncology

## 2020-12-20 VITALS — BP 154/79 | HR 74 | Temp 98.1°F | Resp 19 | Ht 66.0 in | Wt 285.4 lb

## 2020-12-20 DIAGNOSIS — Z8673 Personal history of transient ischemic attack (TIA), and cerebral infarction without residual deficits: Secondary | ICD-10-CM | POA: Insufficient documentation

## 2020-12-20 DIAGNOSIS — E785 Hyperlipidemia, unspecified: Secondary | ICD-10-CM | POA: Insufficient documentation

## 2020-12-20 DIAGNOSIS — Z7901 Long term (current) use of anticoagulants: Secondary | ICD-10-CM | POA: Diagnosis present

## 2020-12-20 DIAGNOSIS — Z170 Estrogen receptor positive status [ER+]: Secondary | ICD-10-CM | POA: Insufficient documentation

## 2020-12-20 DIAGNOSIS — Z8042 Family history of malignant neoplasm of prostate: Secondary | ICD-10-CM | POA: Insufficient documentation

## 2020-12-20 DIAGNOSIS — Z79899 Other long term (current) drug therapy: Secondary | ICD-10-CM | POA: Diagnosis not present

## 2020-12-20 DIAGNOSIS — C50912 Malignant neoplasm of unspecified site of left female breast: Secondary | ICD-10-CM

## 2020-12-20 DIAGNOSIS — C50212 Malignant neoplasm of upper-inner quadrant of left female breast: Secondary | ICD-10-CM | POA: Insufficient documentation

## 2020-12-20 NOTE — Progress Notes (Signed)
Radiation Oncology         [phone] ________________________________  Initial Outpatient Consultation  Name: [name] Collier MRN: [id number]  Date: 12/20/2020  DOB: March 03, 1946  LS:[id number], [name] Distance, DO  Donnie Mesa, MD   REFERRING PHYSICIAN: Donnie Mesa, MD  DIAGNOSIS: T15.726   ICD-10-CM   1. Invasive lobular carcinoma of left breast, stage 1 ([place])  C50.912 Ambulatory referral to Social Work  2. Carcinoma of upper-inner quadrant of left breast in female, estrogen receptor positive ([place])  C50.212    Z17.0      Stage I Left Breast UIQ Invasive Lobular Carcinoma, ER+ / PR+ / Her2-, Grade 1-2  CHIEF COMPLAINT: Here to discuss management of left breast cancer  HISTORY OF PRESENT ILLNESS::[name] Collier is a 75 y.o. female who presented with left breast abnormality on the following imaging: bilateral screening mammogram on the date of 11/29/021. No symptoms were reported at that time. Ultrasound of the left breast on 11/21/2020 revealed a subtle distortion without a sonographic correlate in the medial to upper inner left breast. There was no evidence of left axillary lymphadenopathy. Biopsy on the date of 11/26/2020 showed invasive mammary carcinoma. E-cadherin was negative, supporting a lobular origin. ER status: 50% strong; PR status: 90% strong; Her2 status: negative; Grade: 1-2.  She lives in [place].  She is due for her both COVID booster shot.  She is here with her cousin today.  She denies any lymphedema or pain.  She sees medical oncology next week.  She is scheduled for left breast lumpectomy and sentinel lymph node biopsy on 01/07/2021.  PREVIOUS RADIATION THERAPY: No  PAST MEDICAL HISTORY:  has a past medical history of GERD (gastroesophageal reflux disease), HTN (hypertension), Hyperlipidemia, and TIA (transient ischemic attack).    PAST SURGICAL HISTORY: Past Surgical History:  Procedure Laterality Date  . BOTOX INJECTION N/A 12/22/2018   Procedure:  BOTOX INJECTION INTO ANAL SPHINCTER;  Surgeon: Ileana Roup, MD;  Location: [facility] ORS;  Service: General;  Laterality: N/A;  . BREAST BIOPSY Left 11/26/2020  . CARDIOVERSION N/A 01/04/2018   Procedure: CARDIOVERSION;  Surgeon: Dorothy Spark, MD;  Location: [facility] ENDOSCOPY;  Service: Cardiovascular;  Laterality: N/A;  . CARDIOVERSION N/A 09/20/2018   Procedure: CARDIOVERSION;  Surgeon: Sueanne Margarita, MD;  Location: [facility] ENDOSCOPY;  Service: Cardiovascular;  Laterality: N/A;  . CARDIOVERSION N/A 10/27/2018   Procedure: CARDIOVERSION;  Surgeon: Buford Dresser, MD;  Location: [facility] ENDOSCOPY;  Service: Cardiovascular;  Laterality: N/A;  . KNEE SURGERY Bilateral    Torn miniscus  . RECTAL EXAM UNDER ANESTHESIA N/A 12/22/2018   Procedure: ANORECTAL  EXAM UNDER ANESTHESIA;  Surgeon: Ileana Roup, MD;  Location: [facility] ORS;  Service: General;  Laterality: N/A;  . [place]    . TONSILLECTOMY    . VAGINAL HYSTERECTOMY      FAMILY HISTORY: family history includes Hyperlipidemia in her mother; Hypertension in her mother; Neurologic Disorder (age of onset: 1) in her mother; Prostate cancer in her brother; Stroke in her grandchild, maternal uncle, and son.  SOCIAL HISTORY:  reports that she has never smoked. She has never used smokeless tobacco. She reports that she does not drink alcohol and does not use drugs.  ALLERGIES: Augmentin [amoxicillin-pot clavulanate], Codeine, and Prednisone  MEDICATIONS:  Current Outpatient Medications  Medication Sig Dispense Refill  . acetaminophen (TYLENOL) 500 MG tablet Take 500 mg by mouth every 6 (six) hours as needed for moderate pain or headache.     Marland Kitchen  albuterol (VENTOLIN HFA) 108 (90 Base) MCG/ACT inhaler TAKE 2 PUFFS BY MOUTH EVERY 6 HOURS AS NEEDED FOR WHEEZE OR SHORTNESS OF BREATH 6.7 g 0  . amLODipine (NORVASC) 5 MG tablet Take 1 tablet (5 mg total) by mouth daily.    . Cholecalciferol (VITAMIN D3) 5000 units CAPS Take 5,000 Units by  mouth at bedtime.    . dofetilide (TIKOSYN) 500 MCG capsule TAKE 1 CAPSULE (500 MCG TOTAL) BY MOUTH 2 (TWO) TIMES DAILY. 60 capsule 6  . DULoxetine (CYMBALTA) 30 MG capsule TAKE 1 CAPSULE BY MOUTH  DAILY 90 capsule 0  . ezetimibe (ZETIA) 10 MG tablet TAKE 1 TABLET BY MOUTH  DAILY 90 tablet 1  . famotidine (PEPCID) 20 MG tablet TAKE 1 TABLET BY MOUTH  TWICE DAILY 180 tablet 3  . furosemide (LASIX) 20 MG tablet TAKE 1 TABLET BY MOUTH  DAILY 90 tablet 1  . loratadine (CLARITIN) 10 MG tablet Take 1 tablet (10 mg total) by mouth daily. 30 tablet 11  . olmesartan (BENICAR) 5 MG tablet TAKE 1 TABLET BY MOUTH  DAILY 90 tablet 1  . potassium chloride SA (KLOR-CON) 20 MEQ tablet TAKE 1 TABLET BY MOUTH  TWICE DAILY 180 tablet 3  . rivaroxaban (XARELTO) 20 MG TABS tablet TAKE 1 TABLET (20 MG TOTAL) BY MOUTH DAILY WITH SUPPER. 90 tablet 2   No current facility-administered medications for this encounter.    REVIEW OF SYSTEMS: As above  PHYSICAL EXAM:  height is _0  (1.676 m) and weight is 285 lb 6 oz (129.4 kg). Her oral temperature is 98.1 F (36.7 C). Her blood pressure is 154/79 (abnormal) and her pulse is 74. Her respiration is 19 and oxygen saturation is 98%.   General: Alert and oriented, in no acute distress  Breasts: No palpable masses appreciated in the breasts or axillary regions bilaterally   ECOG = 1  0 - Asymptomatic (Fully active, able to carry on all predisease activities without restriction)  1 - Symptomatic but completely ambulatory (Restricted in physically strenuous activity but ambulatory and able to carry out work of a light or sedentary nature. For example, light housework, office work)  2 - Symptomatic, <50% in bed during the day (Ambulatory and capable of all self care but unable to carry out any work activities. Up and about more than 50% of waking hours)  3 - Symptomatic, >50% in bed, but not bedbound (Capable of only limited self-care, confined to bed or chair 50% or  more of waking hours)  4 - Bedbound (Completely disabled. Cannot carry on any self-care. Totally confined to bed or chair)  5 - Death   Eustace Pen MM, Creech RH, Tormey DC, et al. [phone]). "Toxicity and response criteria of the [facility]". [place] Oncol. 5 (6): 649-55   LABORATORY DATA:  Lab Results  Component Value Date   WBC 5.1 11/27/2020   HGB 13.4 11/27/2020   HCT 40.5 11/27/2020   MCV 88 11/27/2020   PLT 269 11/27/2020   CMP     Component Value Date/Time   NA 141 11/27/2020 0828   K 4.3 11/27/2020 0828   CL 105 11/27/2020 0828   CO2 23 11/27/2020 0828   GLUCOSE 99 11/27/2020 0828   GLUCOSE 117 (H) 05/28/2020 1124   BUN 15 11/27/2020 0828   CREATININE 0.83 11/27/2020 0828   CALCIUM 9.2 11/27/2020 0828   PROT 6.6 11/27/2020 0828   ALBUMIN 3.8 11/27/2020 0828   AST 18 11/27/2020 0828  ALT 13 11/27/2020 0828   ALKPHOS 85 11/27/2020 0828   BILITOT 1.0 11/27/2020 0828   GFRNONAA 70 11/27/2020 0828   GFRAA 80 11/27/2020 0828         RADIOGRAPHY: US BREAST LTD UNI LEFT INC AXILLA  Result Date: 11/21/2020 CLINICAL DATA:  Screening recall for a left breast asymmetry. EXAM: DIGITAL DIAGNOSTIC UNILATERAL LEFT MAMMOGRAM WITH TOMO AND CAD; ULTRASOUND LEFT BREAST LIMITED COMPARISON:  Previous exam(s). ACR Breast Density Category c: The breast tissue is heterogeneously dense, which may obscure small masses. FINDINGS: Spot compression tomosynthesis images as well as rolled medial and lateral full paddle CC images demonstrate a persistent asymmetry in the slightly upper inner left breast, middle to anterior depth. There is 1 round calcification that appears to be centrally located within the area of distortion this is best seen on the CC view. Mammographic images were processed with CAD. No discrete palpable masses are identified in the medial to upper inner left breast. Ultrasound targeted to the left breast at 10 o'clock, 1 cm from the nipple demonstrates no  definite abnormal sonographic findings. There is an anechoic circumscribed cyst at 10 o'clock, 1 cm from the nipple measuring 7 mm. Ultrasound of the left axilla demonstrates multiple normal-appearing lymph nodes. IMPRESSION: 1. There is a subtle distortion without a sonographic correlate in the medial to upper inner left breast. 2.  No evidence of left axillary lymphadenopathy. RECOMMENDATION: Stereotactic biopsy is recommended for the left breast distortion. This has been scheduled for 11/26/2020 at 2:45 p.m. I have discussed the findings and recommendations with the patient. If applicable, a reminder letter will be sent to the patient regarding the next appointment. BI-RADS CATEGORY  4: Suspicious. Electronically Signed   By: Ammie Ferrier M.D.   On: 11/21/2020 13:00   MM DIAG BREAST TOMO UNI LEFT  Result Date: 11/21/2020 CLINICAL DATA:  Screening recall for a left breast asymmetry. EXAM: DIGITAL DIAGNOSTIC UNILATERAL LEFT MAMMOGRAM WITH TOMO AND CAD; ULTRASOUND LEFT BREAST LIMITED COMPARISON:  Previous exam(s). ACR Breast Density Category c: The breast tissue is heterogeneously dense, which may obscure small masses. FINDINGS: Spot compression tomosynthesis images as well as rolled medial and lateral full paddle CC images demonstrate a persistent asymmetry in the slightly upper inner left breast, middle to anterior depth. There is 1 round calcification that appears to be centrally located within the area of distortion this is best seen on the CC view. Mammographic images were processed with CAD. No discrete palpable masses are identified in the medial to upper inner left breast. Ultrasound targeted to the left breast at 10 o'clock, 1 cm from the nipple demonstrates no definite abnormal sonographic findings. There is an anechoic circumscribed cyst at 10 o'clock, 1 cm from the nipple measuring 7 mm. Ultrasound of the left axilla demonstrates multiple normal-appearing lymph nodes. IMPRESSION: 1. There is a  subtle distortion without a sonographic correlate in the medial to upper inner left breast. 2.  No evidence of left axillary lymphadenopathy. RECOMMENDATION: Stereotactic biopsy is recommended for the left breast distortion. This has been scheduled for 11/26/2020 at 2:45 p.m. I have discussed the findings and recommendations with the patient. If applicable, a reminder letter will be sent to the patient regarding the next appointment. BI-RADS CATEGORY  4: Suspicious. Electronically Signed   By: Ammie Ferrier M.D.   On: 11/21/2020 13:00   MM CLIP PLACEMENT LEFT  Result Date: 11/26/2020 CLINICAL DATA:  Evaluate biopsy marker EXAM: DIAGNOSTIC LEFT MAMMOGRAM POST STEREOTACTIC BIOPSY COMPARISON:  Previous  exam(s). FINDINGS: Mammographic images were obtained following stereotactic guided biopsy of left breast distortion. The biopsy marking clip is in expected position at the site of biopsy. IMPRESSION: Appropriate positioning of the coil shaped biopsy marking clip at the site of biopsy in the region of the biopsied distortion. Final Assessment: Post Procedure Mammograms for Marker Placement Electronically Signed   By: Dorise Bullion III M.D   On: 11/26/2020 15:35   MM LT BREAST BX W LOC DEV 1ST LESION IMAGE BX SPEC STEREO GUIDE  Addendum Date: 11/28/2020   ADDENDUM REPORT: 11/28/2020 13:53 ADDENDUM: PATHOLOGY revealed: Breast, left, needle core biopsy, medial- INVASIVE MAMMARY CARCINOMA. Grade 1-2 of 3, and measures 0.4 cm in greatest linear extent. Pathology results are CONCORDANT with imaging findings, per Dr. Dorise Bullion. Pathology results and recommendations below were discussed with patient by telephone on 11/27/2020. Patient reported biopsy site within normal limits with slight tenderness at the site. Post biopsy care instructions were reviewed, questions were answered and my direct phone number was provided to patient. Patient was instructed to call [place] if any  concerns or questions arise related to the biopsy. Recommendation: Surgical consultation has been arranged for patient to see Dr. Donnie Mesa at [facility] on 12/16/2020 at 11:00 o'clock. Pathology results reported by Electa Sniff RN on 11/28/2020. Electronically Signed   By: Dorise Bullion III M.D   On: 11/28/2020 13:53   Result Date: 11/28/2020 CLINICAL DATA:  Left breast distortion EXAM: LEFT BREAST STEREOTACTIC CORE NEEDLE BIOPSY COMPARISON:  Previous exams. FINDINGS: The patient and I discussed the procedure of stereotactic-guided biopsy including benefits and alternatives. We discussed the high likelihood of a successful procedure. We discussed the risks of the procedure including infection, bleeding, tissue injury, clip migration, and inadequate sampling. Informed written consent was given. The usual time out protocol was performed immediately prior to the procedure. Using sterile technique and 1% Lidocaine as local anesthetic, under stereotactic guidance, a 9 gauge vacuum assisted device was used to perform core needle biopsy of distortion in the medial left breast using a superior approach. Lesion quadrant: Medial At the conclusion of the procedure, coil shaped tissue marker clip was deployed into the biopsy cavity. Follow-up 2-view mammogram was performed and dictated separately. IMPRESSION: Stereotactic-guided biopsy of distortion in the medial left breast. No apparent complications. Electronically Signed: By: Dorise Bullion III M.D On: 11/26/2020 15:27      IMPRESSION/PLAN: Left breast cancer  It was a pleasure meeting the patient today. We discussed the risks, benefits, and side effects of radiotherapy.   We discussed that given her estrogen positivity and her age, radiation therapy would probably not play a role in improving her life expectancy.  She will likely have a good prognosis if she undergoes breast conserving surgery followed by at least antiestrogen therapy.  That  said, her estrogen sensitivity is partial and I think it is very reasonable for her to undergo an added layer of local protection against relapses.  Therefore, I recommend radiotherapy to the left breast to reduce her risk of locoregional recurrence by 2/3.  We discussed that radiation would take approximately 3-4 weeks to complete and that I would give the patient a few weeks to heal following surgery before starting treatment planning.  If chemotherapy were to be given, this would precede radiotherapy. We spoke about acute effects including skin irritation and fatigue as well as much less common late effects including internal organ injury or irritation. We spoke about the  latest technology that is used to minimize the risk of late effects for patients undergoing radiotherapy to the breast or chest wall. No guarantees of treatment were given. The patient is enthusiastic about proceeding with treatment. I look forward to participating in the patient's care.  I will await her referral back to me for postoperative follow-up and eventual CT simulation/treatment planning.  Consent form was signed and placed in her chart today.  We discussed measures to reduce the risk of infection during the COVID-19 pandemic.  She is due for her booster shot.  I highly recommended that she receive this booster shot.  We will look into whether this can be given in a timely fashion today(her cousin needs to get back to work soon) and if she does not get it here today her cousin will help her schedule it at a local pharmacy as soon as possible.  She knows to get the shot in her right arm contralateral from where her breast malignancy is.  She understands the importance of getting the booster shot as soon as possible given the high rates of infection in our community.  On date of service, in total, I spent 47 minutes on this encounter. Patient was seen in person.   __________________________________________   [name],  MD  This document serves as a record of services personally performed by [name], MD. It was created on his behalf by Clerance Lav, a trained medical scribe. The creation of this record is based on the scribe's personal observations and the provider's statements to them. This document has been checked and approved by the attending provider.

## 2020-12-23 ENCOUNTER — Telehealth: Payer: Self-pay | Admitting: Licensed Clinical Social Worker

## 2020-12-23 NOTE — Telephone Encounter (Signed)
TC to patient to follow-up on distress screen. Patient unable to talk at the moment, asked for a return call tomorrow.   Christeen Douglas, LCSW

## 2020-12-24 ENCOUNTER — Encounter: Payer: Self-pay | Admitting: Licensed Clinical Social Worker

## 2020-12-24 ENCOUNTER — Ambulatory Visit: Payer: Medicare Other | Admitting: Radiation Oncology

## 2020-12-24 ENCOUNTER — Ambulatory Visit: Payer: Medicare Other

## 2020-12-24 NOTE — Progress Notes (Signed)
[facility]  Initial Assessment  [name] Collier is a 75 y.o. year old female contacted by phone. Clinical Social Work was referred by distress screen for assessment of psychosocial needs.   SDOH (Social Determinants of Health) assessments performed: Yes SDOH Interventions   Flowsheet Row Most Recent Value  SDOH Interventions   Food Insecurity Interventions Intervention Not Indicated  Financial Strain Interventions Intervention Not Indicated      Distress Screen completed: Yes ONCBCN DISTRESS SCREENING 12/20/2020  Screening Type Initial Screening  Distress experienced in past week (1-10) 10  Family Problem type Other (comment)  Emotional problem type Nervousness/Anxiety;Adjusting to illness  Information Concerns Type Lack of info about diagnosis  Physician notified of physical symptoms No  Referral to clinical psychology No  Referral to clinical social work Yes  Referral to dietition No  Referral to financial advocate No  Referral to support programs No  Referral to palliative care No      Family/Social Information:  . Housing Arrangement: patient lives alone . Family members/support persons in your life? 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 Transportation concerns: no  . Employment: Retired. Income source: Conservation officer, historic buildings . Financial concerns: No o Type of concern: None . Food access concerns: no . Religious or spiritual practice: yes, faith and God are helping her through diagnosis . Medication Concerns: no  . Services Currently in place:  n/a  Coping/ Adjustment to diagnosis: . Patient understands treatment plan and what happens next? yes, understands plan so far. Will meet with medical oncology tomorrow . Concerns about diagnosis and/or treatment: Overwhelmed by information and general adjustment to unexpected cancer diagnosis . Patient reported stressors: Adjusting to my illness . Current coping skills/  strengths: Average or above average intelligence, Capable of independent living, Communication skills, Motivation for treatment/growth, Religious Affiliation and Supportive family/friends    SUMMARY: Current SDOH Barriers:  . No significant SDOH barriers noted today  Clinical Social Work Clinical Goal(s):  Marland Kitchen No clinical SW goals at this time  Interventions: . Discussed common feeling and emotions when being diagnosed with cancer, and the importance of support during treatment . Informed patient of the support team roles and support services at [facility] . Provided CSW contact information and encouraged patient to call with any questions or concerns   Follow Up Plan: Patient will contact CSW with any support or resource needs Patient verbalizes understanding of plan: Yes    Christeen Douglas , LCSW

## 2020-12-24 NOTE — Progress Notes (Signed)
[facility] CONSULT NOTE  Patient Care Team: [name], DO as PCP - General (Family Medicine) Minus Breeding, MD as PCP - Cardiology (Cardiology) Thompson Grayer, MD as PCP - Electrophysiology (Cardiology)  CHIEF COMPLAINTS/PURPOSE OF CONSULTATION:  Newly diagnosed breast cancer  HISTORY OF PRESENTING ILLNESS:  [name] Collier 75 y.o. female is here because of recent diagnosis of invasive mammary carcinoma of the left breast. Screening mammogram detected a left breast asymmetry. Diagnostic mammogram and [place] on 11/21/20 showed a 0.7cm mass at the 10 o'clock position and normal left axillary lymph nodes. Biopsy on 11/26/20 showed invasive mammary carcinoma, grade 1-2, HER-2 negative (1+), ER+ 50%, PR+ 90%, Ki67 10%. She presents to the clinic today for initial evaluation and discussion of treatment options.   I reviewed her records extensively and collaborated the history with the patient.  SUMMARY OF ONCOLOGIC HISTORY: Oncology History  Carcinoma of upper-inner quadrant of left breast in female, estrogen receptor positive ([place])  12/20/2020 Initial Diagnosis   Screening mammogram detected a left breast asymmetry. Diagnostic mammogram and US showed a 0.7cm mass at the 10 o'clock position and normal left axillary lymph nodes. Biopsy showed invasive lobular carcinoma, grade 1-2, HER-2 negative (1+), ER+ 50%, PR+ 90%, Ki67 10%.   12/25/2020 Cancer Staging   Staging form: Breast, AJCC 8th Edition - Clinical: Stage IA (cT1b, cN0, cM0, G2, ER+, PR+, HER2-) - Signed by [name], MD on 12/25/2020     MEDICAL HISTORY:  Past Medical History:  Diagnosis Date   GERD (gastroesophageal reflux disease)    HTN (hypertension)    Hyperlipidemia    TIA (transient ischemic attack)     SURGICAL HISTORY: Past Surgical History:  Procedure Laterality Date   BOTOX INJECTION N/A 12/22/2018   Procedure: BOTOX INJECTION INTO ANAL SPHINCTER;  Surgeon: Ileana Roup, MD;   Location: [facility];  Service: General;  Laterality: N/A;   BREAST BIOPSY Left 11/26/2020   CARDIOVERSION N/A 01/04/2018   Procedure: CARDIOVERSION;  Surgeon: Dorothy Spark, MD;  Location: [facility] ENDOSCOPY;  Service: Cardiovascular;  Laterality: N/A;   CARDIOVERSION N/A 09/20/2018   Procedure: CARDIOVERSION;  Surgeon: Sueanne Margarita, MD;  Location: [facility] ENDOSCOPY;  Service: Cardiovascular;  Laterality: N/A;   CARDIOVERSION N/A 10/27/2018   Procedure: CARDIOVERSION;  Surgeon: Buford Dresser, MD;  Location: [facility];  Service: Cardiovascular;  Laterality: N/A;   KNEE SURGERY Bilateral    Torn miniscus   RECTAL EXAM UNDER ANESTHESIA N/A 12/22/2018   Procedure: ANORECTAL  EXAM UNDER ANESTHESIA;  Surgeon: Ileana Roup, MD;  Location: [facility];  Service: General;  Laterality: N/A;   RECTOCELE REPAIR     TONSILLECTOMY     VAGINAL HYSTERECTOMY      SOCIAL HISTORY: Social History   Socioeconomic History   Marital status: Widowed    Spouse name: Not on file   Number of children: 2   Years of education: 55   Highest education level: Not on file  Occupational History   Occupation: Retired    Comment: Personnel officer  Tobacco Use   Smoking status: Never Smoker   Smokeless tobacco: Never Used  Scientific laboratory technician Use: Never used  Substance and Sexual Activity   Alcohol use: No    Alcohol/week: 0.0 standard drinks   Drug use: No   Sexual activity: Not Currently  Other Topics Concern   Not on file  Social History Narrative   Lives alone   Caffeine use: Soda/tea daily   Social  Determinants of Health   Financial Resource Strain: Low Risk    Difficulty of Paying Living Expenses: Not hard at all  Food Insecurity: No Food Insecurity   Worried About Charity fundraiser in the Last Year: Never true   Ran Out of Food in the Last Year: Never true  Transportation Needs: Not on file  Physical Activity: Not on file  Stress: Not on file  Social  Connections: Not on file  Intimate Partner Violence: Not on file    FAMILY HISTORY: Family History  Problem Relation Age of Onset   Hypertension Mother    Hyperlipidemia Mother    Neurologic Disorder Mother 16       GB   Stroke Son    Stroke Maternal Uncle    Stroke Grandchild    Prostate cancer Brother     ALLERGIES:  is allergic to augmentin [amoxicillin-pot clavulanate], codeine, and prednisone.  MEDICATIONS:  Current Outpatient Medications  Medication Sig Dispense Refill   acetaminophen (TYLENOL) 500 MG tablet Take 500 mg by mouth every 6 (six) hours as needed for moderate pain or headache.      albuterol (VENTOLIN HFA) 108 (90 Base) MCG/ACT inhaler TAKE 2 PUFFS BY MOUTH EVERY 6 HOURS AS NEEDED FOR WHEEZE OR SHORTNESS OF BREATH 6.7 g 0   amLODipine (NORVASC) 5 MG tablet Take 1 tablet (5 mg total) by mouth daily.     Cholecalciferol (VITAMIN D3) 5000 units CAPS Take 5,000 Units by mouth at bedtime.     dofetilide (TIKOSYN) 500 MCG capsule TAKE 1 CAPSULE (500 MCG TOTAL) BY MOUTH 2 (TWO) TIMES DAILY. 60 capsule 6   DULoxetine (CYMBALTA) 30 MG capsule TAKE 1 CAPSULE BY MOUTH  DAILY 90 capsule 0   ezetimibe (ZETIA) 10 MG tablet TAKE 1 TABLET BY MOUTH  DAILY 90 tablet 1   famotidine (PEPCID) 20 MG tablet TAKE 1 TABLET BY MOUTH  TWICE DAILY 180 tablet 3   furosemide (LASIX) 20 MG tablet TAKE 1 TABLET BY MOUTH  DAILY 90 tablet 1   loratadine (CLARITIN) 10 MG tablet Take 1 tablet (10 mg total) by mouth daily. 30 tablet 11   olmesartan (BENICAR) 5 MG tablet TAKE 1 TABLET BY MOUTH  DAILY 90 tablet 1   potassium chloride SA (KLOR-CON) 20 MEQ tablet TAKE 1 TABLET BY MOUTH  TWICE DAILY 180 tablet 3   rivaroxaban (XARELTO) 20 MG TABS tablet TAKE 1 TABLET (20 MG TOTAL) BY MOUTH DAILY WITH SUPPER. 90 tablet 2   No current facility-administered medications for this visit.    REVIEW OF SYSTEMS:     All other systems were reviewed with the patient and are  negative.  PHYSICAL EXAMINATION: ECOG PERFORMANCE STATUS: 1 - Symptomatic but completely ambulatory  Vitals:   12/25/20 1605  BP: (!) 147/99  Pulse: 65  Resp: 17  Temp: 97.9 F (36.6 C)  SpO2: 97%   Filed Weights   12/25/20 1605  Weight: 284 lb 9.6 oz (129.1 kg)      LABORATORY DATA:  I have reviewed the data as listed Lab Results  Component Value Date   WBC 5.1 11/27/2020   HGB 13.4 11/27/2020   HCT 40.5 11/27/2020   MCV 88 11/27/2020   PLT 269 11/27/2020   Lab Results  Component Value Date   NA 141 11/27/2020   K 4.3 11/27/2020   CL 105 11/27/2020   CO2 23 11/27/2020    RADIOGRAPHIC STUDIES: I have personally reviewed the radiological reports and agreed with  the findings in the report.  ASSESSMENT AND PLAN:  Carcinoma of upper-inner quadrant of left breast in female, estrogen receptor positive ([place]) 12/20/2020: Screening mammogram detected left breast asymmetry.  Ultrasound revealed 0.7 cm mass at 10 o'clock position, axilla negative, biopsy revealed grade 1-2 invasive lobular cancer, ER 50%, PR 90%, Ki-67 10%, HER2 negative by IHC 1+ T1BN0 stage Ia clinical stage  Pathology and radiology counseling:Discussed with the patient, the details of pathology including the type of breast cancer,the clinical staging, the significance of ER, PR and HER-2/neu receptors and the implications for treatment. After reviewing the pathology in detail, we proceeded to discuss the different treatment options between surgery, radiation, chemotherapy, antiestrogen therapies.  Recommendations: 1. Breast conserving surgery with sentinel lymph node biopsy  (scheduled for January 07, 2021) 2. Oncotype DX testing to determine if chemotherapy would be of any benefit followed by 3. Adjuvant radiation therapy followed by 4. Adjuvant antiestrogen therapy We will obtain a breast MRI because of lobular nature of her breast cancer.  Oncotype counseling: I discussed Oncotype DX test. I explained  to the patient that this is a 21 gene panel to evaluate patient tumors DNA to calculate recurrence score. This would help determine whether patient has high risk or low risk breast cancer. She understands that if her tumor was found to be high risk, she would benefit from systemic chemotherapy. If low risk, no need of chemotherapy.  Return to clinic after surgery to discuss final pathology report and then determine if Oncotype DX testing will need to be sent.  All questions were answered. The patient knows to call the clinic with any problems, questions or concerns.   Rulon Eisenmenger, MD, MPH 12/25/2020    I, Molly Dorshimer, am acting as scribe for [name], MD.  I have reviewed the above documentation for accuracy and completeness, and I agree with the above.

## 2020-12-25 ENCOUNTER — Other Ambulatory Visit: Payer: Self-pay

## 2020-12-25 ENCOUNTER — Inpatient Hospital Stay: Payer: Medicare Other | Attending: Hematology and Oncology | Admitting: Hematology and Oncology

## 2020-12-25 DIAGNOSIS — Z79899 Other long term (current) drug therapy: Secondary | ICD-10-CM | POA: Insufficient documentation

## 2020-12-25 DIAGNOSIS — I10 Essential (primary) hypertension: Secondary | ICD-10-CM | POA: Diagnosis not present

## 2020-12-25 DIAGNOSIS — Z7901 Long term (current) use of anticoagulants: Secondary | ICD-10-CM | POA: Diagnosis not present

## 2020-12-25 DIAGNOSIS — Z8673 Personal history of transient ischemic attack (TIA), and cerebral infarction without residual deficits: Secondary | ICD-10-CM | POA: Diagnosis not present

## 2020-12-25 DIAGNOSIS — C50212 Malignant neoplasm of upper-inner quadrant of left female breast: Secondary | ICD-10-CM | POA: Diagnosis not present

## 2020-12-25 DIAGNOSIS — K219 Gastro-esophageal reflux disease without esophagitis: Secondary | ICD-10-CM | POA: Diagnosis not present

## 2020-12-25 DIAGNOSIS — E785 Hyperlipidemia, unspecified: Secondary | ICD-10-CM | POA: Insufficient documentation

## 2020-12-25 DIAGNOSIS — Z170 Estrogen receptor positive status [ER+]: Secondary | ICD-10-CM | POA: Insufficient documentation

## 2020-12-25 DIAGNOSIS — Z8249 Family history of ischemic heart disease and other diseases of the circulatory system: Secondary | ICD-10-CM | POA: Insufficient documentation

## 2020-12-25 DIAGNOSIS — Z8042 Family history of malignant neoplasm of prostate: Secondary | ICD-10-CM | POA: Diagnosis not present

## 2020-12-25 DIAGNOSIS — Z8349 Family history of other endocrine, nutritional and metabolic diseases: Secondary | ICD-10-CM | POA: Insufficient documentation

## 2020-12-25 NOTE — Assessment & Plan Note (Addendum)
12/20/2020: Screening mammogram detected left breast asymmetry.  Ultrasound revealed 0.7 cm mass at 10 o'clock position, axilla negative, biopsy revealed grade 1-2 invasive lobular cancer, ER 50%, PR 90%, Ki-67 10%, HER2 negative by IHC 1+ T1BN0 stage Ia clinical stage  Pathology and radiology counseling:Discussed with the patient, the details of pathology including the type of breast cancer,the clinical staging, the significance of ER, PR and HER-2/neu receptors and the implications for treatment. After reviewing the pathology in detail, we proceeded to discuss the different treatment options between surgery, radiation, chemotherapy, antiestrogen therapies.  Recommendations: 1. Breast conserving surgery with sentinel lymph node biopsy followed by 2. Oncotype DX testing to determine if chemotherapy would be of any benefit followed by 3. Adjuvant radiation therapy followed by 4. Adjuvant antiestrogen therapy  Oncotype counseling: I discussed Oncotype DX test. I explained to the patient that this is a 21 gene panel to evaluate patient tumors DNA to calculate recurrence score. This would help determine whether patient has high risk or low risk breast cancer. She understands that if her tumor was found to be high risk, she would benefit from systemic chemotherapy. If low risk, no need of chemotherapy.  Return to clinic after surgery to discuss final pathology report and then determine if Oncotype DX testing will need to be sent.

## 2020-12-26 ENCOUNTER — Encounter: Payer: Self-pay | Admitting: *Deleted

## 2020-12-30 ENCOUNTER — Telehealth: Payer: Self-pay | Admitting: Hematology and Oncology

## 2020-12-30 NOTE — Telephone Encounter (Signed)
Left message with upcoming appointment. Gave option to call back to reschedule if needed. 

## 2020-12-31 ENCOUNTER — Ambulatory Visit: Payer: Medicare Other

## 2020-12-31 ENCOUNTER — Ambulatory Visit: Payer: Medicare Other | Admitting: Radiation Oncology

## 2020-12-31 ENCOUNTER — Institutional Professional Consult (permissible substitution): Payer: Medicare Other | Admitting: Radiation Oncology

## 2021-01-01 ENCOUNTER — Ambulatory Visit
Admission: RE | Admit: 2021-01-01 | Discharge: 2021-01-01 | Disposition: A | Discharge status: Home / Self Care | Payer: Medicare Other | Source: Ambulatory Visit | Attending: Hematology and Oncology | Admitting: Hematology and Oncology

## 2021-01-01 ENCOUNTER — Other Ambulatory Visit: Payer: Self-pay

## 2021-01-01 ENCOUNTER — Encounter (HOSPITAL_BASED_OUTPATIENT_CLINIC_OR_DEPARTMENT_OTHER): Payer: Self-pay | Admitting: Surgery

## 2021-01-01 DIAGNOSIS — C50212 Malignant neoplasm of upper-inner quadrant of left female breast: Secondary | ICD-10-CM

## 2021-01-01 DIAGNOSIS — D0512 Intraductal carcinoma in situ of left breast: Secondary | ICD-10-CM | POA: Diagnosis not present

## 2021-01-01 MED ORDER — GADOBUTROL 1 MMOL/ML IV SOLN
10.0000 mL | Freq: Once | INTRAVENOUS | Status: AC | PRN
  Administered 2021-01-01: 10 mL via INTRAVENOUS

## 2021-01-03 ENCOUNTER — Other Ambulatory Visit (HOSPITAL_COMMUNITY)
Admission: RE | Admit: 2021-01-03 | Discharge: 2021-01-03 | Disposition: A | Discharge status: Home / Self Care | Payer: Medicare Other | Source: Ambulatory Visit | Attending: Surgery | Admitting: Surgery

## 2021-01-03 ENCOUNTER — Encounter (HOSPITAL_BASED_OUTPATIENT_CLINIC_OR_DEPARTMENT_OTHER)
Admission: RE | Admit: 2021-01-03 | Discharge: 2021-01-03 | Disposition: A | Discharge status: Home / Self Care | Payer: Medicare Other | Source: Ambulatory Visit | Attending: Surgery | Admitting: Surgery

## 2021-01-03 DIAGNOSIS — Z01812 Encounter for preprocedural laboratory examination: Secondary | ICD-10-CM | POA: Insufficient documentation

## 2021-01-03 DIAGNOSIS — Z20822 Contact with and (suspected) exposure to covid-19: Secondary | ICD-10-CM | POA: Insufficient documentation

## 2021-01-03 LAB — BASIC METABOLIC PANEL
Anion gap: 11 (ref 5–15)
BUN: 11 mg/dL (ref 8–23)
CO2: 25 mmol/L (ref 22–32)
Calcium: 9.2 mg/dL (ref 8.9–10.3)
Chloride: 104 mmol/L (ref 98–111)
Creatinine, Ser: 0.73 mg/dL (ref 0.44–1.00)
GFR, Estimated: >60 mL/min (ref >60)
Glucose, Bld: 100 mg/dL — ABNORMAL HIGH (ref 70–99)
Potassium: 4.5 mmol/L (ref 3.5–5.1)
Sodium: 140 mmol/L (ref 135–145)

## 2021-01-03 LAB — SARS CORONAVIRUS 2 (TAT 6-24 HRS): SARS Coronavirus 2: NEGATIVE

## 2021-01-03 NOTE — Progress Notes (Signed)
Anesthesia consult per Dr. Lanetta Inch, will proceed with surgery as scheduled.

## 2021-01-03 NOTE — Progress Notes (Signed)

## 2021-01-06 ENCOUNTER — Other Ambulatory Visit: Payer: Self-pay

## 2021-01-06 ENCOUNTER — Encounter: Payer: Self-pay | Admitting: *Deleted

## 2021-01-06 ENCOUNTER — Ambulatory Visit
Admission: RE | Admit: 2021-01-06 | Discharge: 2021-01-06 | Disposition: A | Discharge status: Home / Self Care | Payer: Medicare Other | Source: Ambulatory Visit | Attending: Surgery | Admitting: Surgery

## 2021-01-06 DIAGNOSIS — C50912 Malignant neoplasm of unspecified site of left female breast: Secondary | ICD-10-CM

## 2021-01-06 DIAGNOSIS — R928 Other abnormal and inconclusive findings on diagnostic imaging of breast: Secondary | ICD-10-CM | POA: Diagnosis not present

## 2021-01-06 NOTE — Anesthesia Preprocedure Evaluation (Addendum)
Anesthesia Evaluation  Patient identified by MRN, date of birth, ID band Patient awake    Reviewed: Allergy & Precautions, NPO status , Patient's Chart, lab work & pertinent test results  Airway Mallampati: III  TM Distance: >3 FB Neck ROM: Full    Dental  (+) Dental Advisory Given   Pulmonary    Pulmonary exam normal breath sounds clear to auscultation       Cardiovascular hypertension, Pt. on medications Normal cardiovascular exam+ dysrhythmias Atrial Fibrillation  Rhythm:Regular Rate:Normal     Neuro/Psych PSYCHIATRIC DISORDERS Anxiety Depression TIA   GI/Hepatic Neg liver ROS, GERD  Medicated and Controlled,Chronic anal fissure   Endo/Other  Morbid obesity  Renal/GU negative Renal ROS  negative genitourinary   Musculoskeletal  (+) Arthritis ,   Abdominal (+) + obese,   Peds  Hematology  (+) anemia , Xarelto therapy Tikosyn therapy   Anesthesia Other Findings   Reproductive/Obstetrics                           Anesthesia Physical  Anesthesia Plan  ASA: III  Anesthesia Plan: General   Post-op Pain Management: GA combined w/ Regional for post-op pain   Induction: Intravenous  PONV Risk Score and Plan: 4 or greater and Propofol infusion, Ondansetron, Treatment may vary due to age or medical condition, Dexamethasone and Diphenhydramine  Airway Management Planned: LMA  Additional Equipment: None  Intra-op Plan:   Post-operative Plan: Extubation in OR  Informed Consent: I have reviewed the patients History and Physical, chart, labs and discussed the procedure including the risks, benefits and alternatives for the proposed anesthesia with the patient or authorized representative who has indicated his/her understanding and acceptance.     Dental advisory given  Plan Discussed with: CRNA  Anesthesia Plan Comments:        Anesthesia Quick Evaluation

## 2021-01-07 ENCOUNTER — Ambulatory Visit (HOSPITAL_BASED_OUTPATIENT_CLINIC_OR_DEPARTMENT_OTHER)
Admission: RE | Admit: 2021-01-07 | Discharge: 2021-01-07 | Disposition: A | Discharge status: Home / Self Care | Payer: Medicare Other | Attending: Surgery | Admitting: Surgery

## 2021-01-07 ENCOUNTER — Encounter (HOSPITAL_COMMUNITY)
Admission: RE | Admit: 2021-01-07 | Discharge: 2021-01-07 | Disposition: A | Discharge status: Home / Self Care | Payer: Medicare Other | Source: Ambulatory Visit | Attending: Surgery | Admitting: Surgery

## 2021-01-07 ENCOUNTER — Other Ambulatory Visit: Payer: Self-pay

## 2021-01-07 ENCOUNTER — Encounter (HOSPITAL_BASED_OUTPATIENT_CLINIC_OR_DEPARTMENT_OTHER): Payer: Self-pay | Admitting: Surgery

## 2021-01-07 ENCOUNTER — Encounter (HOSPITAL_BASED_OUTPATIENT_CLINIC_OR_DEPARTMENT_OTHER)
Admission: RE | Disposition: A | Discharge status: Home / Self Care | Payer: Self-pay | Source: Home / Self Care | Attending: Surgery

## 2021-01-07 ENCOUNTER — Ambulatory Visit (HOSPITAL_BASED_OUTPATIENT_CLINIC_OR_DEPARTMENT_OTHER): Payer: Medicare Other | Admitting: Anesthesiology

## 2021-01-07 ENCOUNTER — Ambulatory Visit
Admission: RE | Admit: 2021-01-07 | Discharge: 2021-01-07 | Disposition: A | Discharge status: Home / Self Care | Payer: Medicare Other | Source: Ambulatory Visit | Attending: Surgery | Admitting: Surgery

## 2021-01-07 DIAGNOSIS — C50212 Malignant neoplasm of upper-inner quadrant of left female breast: Secondary | ICD-10-CM | POA: Insufficient documentation

## 2021-01-07 DIAGNOSIS — C50912 Malignant neoplasm of unspecified site of left female breast: Secondary | ICD-10-CM

## 2021-01-07 DIAGNOSIS — R928 Other abnormal and inconclusive findings on diagnostic imaging of breast: Secondary | ICD-10-CM | POA: Diagnosis not present

## 2021-01-07 DIAGNOSIS — Z8249 Family history of ischemic heart disease and other diseases of the circulatory system: Secondary | ICD-10-CM | POA: Insufficient documentation

## 2021-01-07 DIAGNOSIS — Z880 Allergy status to penicillin: Secondary | ICD-10-CM | POA: Insufficient documentation

## 2021-01-07 DIAGNOSIS — I4891 Unspecified atrial fibrillation: Secondary | ICD-10-CM | POA: Insufficient documentation

## 2021-01-07 DIAGNOSIS — N62 Hypertrophy of breast: Secondary | ICD-10-CM | POA: Diagnosis not present

## 2021-01-07 DIAGNOSIS — Z8042 Family history of malignant neoplasm of prostate: Secondary | ICD-10-CM | POA: Insufficient documentation

## 2021-01-07 DIAGNOSIS — Z79899 Other long term (current) drug therapy: Secondary | ICD-10-CM | POA: Insufficient documentation

## 2021-01-07 DIAGNOSIS — Z8261 Family history of arthritis: Secondary | ICD-10-CM | POA: Insufficient documentation

## 2021-01-07 DIAGNOSIS — G8918 Other acute postprocedural pain: Secondary | ICD-10-CM | POA: Diagnosis not present

## 2021-01-07 DIAGNOSIS — Z8371 Family history of colonic polyps: Secondary | ICD-10-CM | POA: Diagnosis not present

## 2021-01-07 DIAGNOSIS — Z170 Estrogen receptor positive status [ER+]: Secondary | ICD-10-CM | POA: Insufficient documentation

## 2021-01-07 DIAGNOSIS — Z808 Family history of malignant neoplasm of other organs or systems: Secondary | ICD-10-CM | POA: Diagnosis not present

## 2021-01-07 DIAGNOSIS — Z7982 Long term (current) use of aspirin: Secondary | ICD-10-CM | POA: Diagnosis not present

## 2021-01-07 DIAGNOSIS — Z800 Family history of malignant neoplasm of digestive organs: Secondary | ICD-10-CM | POA: Insufficient documentation

## 2021-01-07 DIAGNOSIS — Z881 Allergy status to other antibiotic agents status: Secondary | ICD-10-CM | POA: Insufficient documentation

## 2021-01-07 DIAGNOSIS — Z820 Family history of epilepsy and other diseases of the nervous system: Secondary | ICD-10-CM | POA: Insufficient documentation

## 2021-01-07 DIAGNOSIS — Z7901 Long term (current) use of anticoagulants: Secondary | ICD-10-CM | POA: Diagnosis not present

## 2021-01-07 DIAGNOSIS — I480 Paroxysmal atrial fibrillation: Secondary | ICD-10-CM | POA: Diagnosis not present

## 2021-01-07 HISTORY — DX: Anxiety disorder, unspecified: F41.9

## 2021-01-07 HISTORY — PX: BREAST LUMPECTOMY WITH RADIOACTIVE SEED AND SENTINEL LYMPH NODE BIOPSY: SHX6550

## 2021-01-07 HISTORY — DX: Unspecified atrial fibrillation: I48.91

## 2021-01-07 SURGERY — BREAST LUMPECTOMY WITH RADIOACTIVE SEED AND SENTINEL LYMPH NODE BIOPSY
Anesthesia: General | Site: Breast | Laterality: Left

## 2021-01-07 MED ORDER — SODIUM CHLORIDE (PF) 0.9 % IJ SOLN
INTRAVENOUS | Status: DC | PRN
  Administered 2021-01-07: 5 mL

## 2021-01-07 MED ORDER — PROPOFOL 10 MG/ML IV BOLUS
INTRAVENOUS | Status: DC | PRN
  Administered 2021-01-07: 200 mg via INTRAVENOUS

## 2021-01-07 MED ORDER — ONDANSETRON HCL 4 MG/2ML IJ SOLN
INTRAMUSCULAR | Status: DC | PRN
  Administered 2021-01-07: 4 mg via INTRAVENOUS

## 2021-01-07 MED ORDER — CEFAZOLIN SODIUM-DEXTROSE 2-4 GM/100ML-% IV SOLN
INTRAVENOUS | Status: AC
  Filled 2021-01-07: qty 100

## 2021-01-07 MED ORDER — FENTANYL CITRATE (PF) 100 MCG/2ML IJ SOLN
INTRAMUSCULAR | Status: AC
  Filled 2021-01-07: qty 2

## 2021-01-07 MED ORDER — 0.9 % SODIUM CHLORIDE (POUR BTL) OPTIME
TOPICAL | Status: DC | PRN
  Administered 2021-01-07: 200 mL

## 2021-01-07 MED ORDER — ACETAMINOPHEN 500 MG PO TABS
ORAL_TABLET | ORAL | Status: AC
  Filled 2021-01-07: qty 2

## 2021-01-07 MED ORDER — PROPOFOL 10 MG/ML IV BOLUS
INTRAVENOUS | Status: AC
  Filled 2021-01-07: qty 40

## 2021-01-07 MED ORDER — PHENYLEPHRINE HCL (PRESSORS) 10 MG/ML IV SOLN
INTRAVENOUS | Status: DC | PRN
  Administered 2021-01-07 (×2): 80 ug via INTRAVENOUS
  Administered 2021-01-07 (×2): 40 ug via INTRAVENOUS
  Administered 2021-01-07 (×2): 80 ug via INTRAVENOUS

## 2021-01-07 MED ORDER — DEXAMETHASONE SODIUM PHOSPHATE 10 MG/ML IJ SOLN
INTRAMUSCULAR | Status: DC | PRN
  Administered 2021-01-07: 10 mg via INTRAVENOUS

## 2021-01-07 MED ORDER — GLYCOPYRROLATE PF 0.2 MG/ML IJ SOSY
PREFILLED_SYRINGE | INTRAMUSCULAR | Status: AC
  Filled 2021-01-07: qty 1

## 2021-01-07 MED ORDER — METHYLENE BLUE 0.5 % INJ SOLN
INTRAVENOUS | Status: AC
  Filled 2021-01-07: qty 10

## 2021-01-07 MED ORDER — CEFAZOLIN SODIUM-DEXTROSE 2-4 GM/100ML-% IV SOLN
2.0000 g | INTRAVENOUS | Status: AC
  Administered 2021-01-07: 2 g via INTRAVENOUS

## 2021-01-07 MED ORDER — AMISULPRIDE (ANTIEMETIC) 5 MG/2ML IV SOLN: 10.0000 mg | Freq: Once | INTRAVENOUS | Status: DC | PRN

## 2021-01-07 MED ORDER — GLYCOPYRROLATE 0.2 MG/ML IJ SOLN
INTRAMUSCULAR | Status: DC | PRN
  Administered 2021-01-07 (×2): .1 mg via INTRAVENOUS

## 2021-01-07 MED ORDER — DEXAMETHASONE SODIUM PHOSPHATE 4 MG/ML IJ SOLN
INTRAMUSCULAR | Status: DC | PRN
Start: 2021-01-07 — End: 2021-01-07
  Administered 2021-01-07: 5 mg

## 2021-01-07 MED ORDER — CHLORHEXIDINE GLUCONATE CLOTH 2 % EX PADS: 6.0000 | MEDICATED_PAD | Freq: Once | CUTANEOUS | Status: DC

## 2021-01-07 MED ORDER — BUPIVACAINE-EPINEPHRINE 0.25% -1:200000 IJ SOLN
INTRAMUSCULAR | Status: DC | PRN
  Administered 2021-01-07: 20 mL

## 2021-01-07 MED ORDER — GABAPENTIN 300 MG PO CAPS
300.0000 mg | ORAL_CAPSULE | ORAL | Status: AC
  Administered 2021-01-07: 300 mg via ORAL

## 2021-01-07 MED ORDER — VASOPRESSIN 20 UNIT/ML IV SOLN
INTRAVENOUS | Status: DC | PRN
  Administered 2021-01-07 (×3): .5 [IU] via INTRAVENOUS

## 2021-01-07 MED ORDER — FENTANYL CITRATE (PF) 100 MCG/2ML IJ SOLN
100.0000 ug | Freq: Once | INTRAMUSCULAR | Status: AC
  Administered 2021-01-07: 50 ug via INTRAVENOUS

## 2021-01-07 MED ORDER — TRAMADOL HCL 50 MG PO TABS: 50.0000 mg | ORAL_TABLET | Freq: Four times a day (QID) | ORAL | 0 refills | Status: DC | PRN

## 2021-01-07 MED ORDER — EPHEDRINE SULFATE 50 MG/ML IJ SOLN
INTRAMUSCULAR | Status: DC | PRN
  Administered 2021-01-07 (×4): 10 mg via INTRAVENOUS
  Administered 2021-01-07 (×2): 5 mg via INTRAVENOUS
  Administered 2021-01-07: 15 mg via INTRAVENOUS
  Administered 2021-01-07: 10 mg via INTRAVENOUS
  Administered 2021-01-07: 5 mg via INTRAVENOUS
  Administered 2021-01-07 (×2): 10 mg via INTRAVENOUS

## 2021-01-07 MED ORDER — CLONIDINE HCL (ANALGESIA) 100 MCG/ML EP SOLN
EPIDURAL | Status: DC | PRN
  Administered 2021-01-07: 80 ug

## 2021-01-07 MED ORDER — LIDOCAINE HCL (PF) 2 % IJ SOLN
INTRAMUSCULAR | Status: DC | PRN
  Administered 2021-01-07: 100 mg via INTRADERMAL

## 2021-01-07 MED ORDER — FENTANYL CITRATE (PF) 100 MCG/2ML IJ SOLN
25.0000 ug | INTRAMUSCULAR | Status: DC | PRN
Start: 2021-01-07 — End: 2021-01-07

## 2021-01-07 MED ORDER — SODIUM CHLORIDE (PF) 0.9 % IJ SOLN
INTRAMUSCULAR | Status: AC
  Filled 2021-01-07: qty 10

## 2021-01-07 MED ORDER — PROPOFOL 500 MG/50ML IV EMUL
INTRAVENOUS | Status: DC | PRN
  Administered 2021-01-07: 25 ug/kg/min via INTRAVENOUS

## 2021-01-07 MED ORDER — MIDAZOLAM HCL 2 MG/2ML IJ SOLN
INTRAMUSCULAR | Status: AC
  Filled 2021-01-07: qty 2

## 2021-01-07 MED ORDER — EPHEDRINE 5 MG/ML INJ
INTRAVENOUS | Status: AC
  Filled 2021-01-07: qty 10

## 2021-01-07 MED ORDER — BUPIVACAINE-EPINEPHRINE (PF) 0.5% -1:200000 IJ SOLN
INTRAMUSCULAR | Status: DC | PRN
  Administered 2021-01-07: 30 mL

## 2021-01-07 MED ORDER — LACTATED RINGERS IV SOLN: INTRAVENOUS | Status: DC

## 2021-01-07 MED ORDER — LIDOCAINE 2% (20 MG/ML) 5 ML SYRINGE
INTRAMUSCULAR | Status: AC
  Filled 2021-01-07: qty 5

## 2021-01-07 MED ORDER — TECHNETIUM TC 99M TILMANOCEPT KIT
1.0000 | PACK | Freq: Once | INTRAVENOUS | Status: AC | PRN
  Administered 2021-01-07: 1 via INTRADERMAL

## 2021-01-07 MED ORDER — ONDANSETRON HCL 4 MG/2ML IJ SOLN
INTRAMUSCULAR | Status: AC
  Filled 2021-01-07: qty 2

## 2021-01-07 MED ORDER — DIPHENHYDRAMINE HCL 50 MG/ML IJ SOLN
INTRAMUSCULAR | Status: DC | PRN
  Administered 2021-01-07: 6.25 mg via INTRAVENOUS

## 2021-01-07 MED ORDER — ACETAMINOPHEN 500 MG PO TABS
1000.0000 mg | ORAL_TABLET | ORAL | Status: AC
  Administered 2021-01-07: 1000 mg via ORAL

## 2021-01-07 MED ORDER — FENTANYL CITRATE (PF) 100 MCG/2ML IJ SOLN
INTRAMUSCULAR | Status: DC | PRN
  Administered 2021-01-07: 25 ug via INTRAVENOUS

## 2021-01-07 MED ORDER — GABAPENTIN 300 MG PO CAPS
ORAL_CAPSULE | ORAL | Status: AC
  Filled 2021-01-07: qty 1

## 2021-01-07 SURGICAL SUPPLY — 58 items
APL PRP STRL LF DISP 70% ISPRP (MISCELLANEOUS) ×1
APL SKNCLS STERI-STRIP NONHPOA (GAUZE/BANDAGES/DRESSINGS) ×1
APPLIER CLIP 9.375 MED OPEN (MISCELLANEOUS) ×2
APR CLP MED 9.3 20 MLT OPN (MISCELLANEOUS) ×1
BENZOIN TINCTURE PRP APPL 2/3 (GAUZE/BANDAGES/DRESSINGS) ×2 IMPLANT
BINDER BREAST 3XL (GAUZE/BANDAGES/DRESSINGS) IMPLANT
BINDER BREAST LRG (GAUZE/BANDAGES/DRESSINGS) IMPLANT
BINDER BREAST MEDIUM (GAUZE/BANDAGES/DRESSINGS) IMPLANT
BINDER BREAST XLRG (GAUZE/BANDAGES/DRESSINGS) IMPLANT
BINDER BREAST XXLRG (GAUZE/BANDAGES/DRESSINGS) IMPLANT
BLADE CLIPPER SURG (BLADE) IMPLANT
BLADE HEX COATED 2.75 (ELECTRODE) ×2 IMPLANT
BLADE SURG 10 STRL SS (BLADE) IMPLANT
BLADE SURG 15 STRL LF DISP TIS (BLADE) ×1 IMPLANT
BLADE SURG 15 STRL SS (BLADE) ×2
CANISTER SUC SOCK COL 7IN (MISCELLANEOUS) ×1 IMPLANT
CANISTER SUCT 1200ML W/VALVE (MISCELLANEOUS) ×1 IMPLANT
CHLORAPREP W/TINT 26 (MISCELLANEOUS) ×2 IMPLANT
CLIP APPLIE 9.375 MED OPEN (MISCELLANEOUS) IMPLANT
COVER BACK TABLE 60X90IN (DRAPES) ×2 IMPLANT
COVER MAYO STAND STRL (DRAPES) ×2 IMPLANT
COVER PROBE W GEL 5X96 (DRAPES) ×2 IMPLANT
COVER WAND RF STERILE (DRAPES) IMPLANT
DECANTER SPIKE VIAL GLASS SM (MISCELLANEOUS) ×1 IMPLANT
DRAPE LAPAROSCOPIC ABDOMINAL (DRAPES) ×2 IMPLANT
DRAPE UTILITY XL STRL (DRAPES) ×2 IMPLANT
DRSG TEGADERM 4X4.75 (GAUZE/BANDAGES/DRESSINGS) ×4 IMPLANT
ELECT REM PT RETURN 9FT ADLT (ELECTROSURGICAL) ×2
ELECTRODE REM PT RTRN 9FT ADLT (ELECTROSURGICAL) ×1 IMPLANT
GAUZE SPONGE 4X4 12PLY STRL LF (GAUZE/BANDAGES/DRESSINGS) IMPLANT
GLOVE SURG ENC MOIS LTX SZ7 (GLOVE) ×2 IMPLANT
GLOVE SURG UNDER POLY LF SZ7.5 (GLOVE) ×2 IMPLANT
GOWN STRL REUS W/ TWL LRG LVL3 (GOWN DISPOSABLE) ×2 IMPLANT
GOWN STRL REUS W/TWL LRG LVL3 (GOWN DISPOSABLE) ×4
ILLUMINATOR WAVEGUIDE N/F (MISCELLANEOUS) IMPLANT
KIT MARKER MARGIN INK (KITS) ×2 IMPLANT
LIGHT WAVEGUIDE WIDE FLAT (MISCELLANEOUS) IMPLANT
NDL HYPO 25X1 1.5 SAFETY (NEEDLE) ×2 IMPLANT
NDL SAFETY ECLIPSE 18X1.5 (NEEDLE) ×1 IMPLANT
NEEDLE HYPO 18GX1.5 SHARP (NEEDLE) ×2
NEEDLE HYPO 25X1 1.5 SAFETY (NEEDLE) ×4 IMPLANT
NS IRRIG 1000ML POUR BTL (IV SOLUTION) ×1 IMPLANT
PACK BASIN DAY SURGERY FS (CUSTOM PROCEDURE TRAY) ×2 IMPLANT
PENCIL SMOKE EVACUATOR (MISCELLANEOUS) ×2 IMPLANT
SLEEVE SCD COMPRESS KNEE MED (MISCELLANEOUS) ×2 IMPLANT
SPONGE LAP 18X18 RF (DISPOSABLE) IMPLANT
SPONGE LAP 4X18 RFD (DISPOSABLE) ×3 IMPLANT
STRIP CLOSURE SKIN 1/2X4 (GAUZE/BANDAGES/DRESSINGS) ×2 IMPLANT
SUT MON AB 4-0 PC3 18 (SUTURE) ×3 IMPLANT
SUT SILK 2 0 SH (SUTURE) IMPLANT
SUT VIC AB 3-0 SH 27 (SUTURE) ×4
SUT VIC AB 3-0 SH 27X BRD (SUTURE) ×1 IMPLANT
SYR BULB EAR ULCER 3OZ GRN STR (SYRINGE) ×2 IMPLANT
SYR CONTROL 10ML LL (SYRINGE) ×4 IMPLANT
TOWEL GREEN STERILE FF (TOWEL DISPOSABLE) ×2 IMPLANT
TRAY FAXITRON CT DISP (TRAY / TRAY PROCEDURE) ×2 IMPLANT
TUBE CONNECTING 20X1/4 (TUBING) ×2 IMPLANT
YANKAUER SUCT BULB TIP NO VENT (SUCTIONS) ×1 IMPLANT

## 2021-01-07 NOTE — Progress Notes (Addendum)
Assisted Dr. Germeroth with left, ultrasound guided, pectoralis block. Side rails up, monitors on throughout procedure. See vital signs in flow sheet. Tolerated Procedure well. 

## 2021-01-07 NOTE — Transfer of Care (Signed)
Immediate Anesthesia Transfer of Care Note  Patient: [name] Collier  Procedure(s) Performed: LEFT BREAST LUMPECTOMY WITH RADIOACTIVE SEED AND LEFT AXILLARY SENTINEL LYMPH NODE BIOPSY WITH BLUE DYE INJECTION (Left Breast)  Patient Location: PACU  Anesthesia Type:General  Level of Consciousness: drowsy  Airway & Oxygen Therapy: Patient Spontanous Breathing and Patient connected to face mask oxygen  Post-op Assessment: Report given to RN and Post -op Vital signs reviewed and stable  Post vital signs: Reviewed and stable  Last Vitals:  Vitals Value Taken Time  BP 116/52 01/07/21 1051  Temp    Pulse 63 01/07/21 1052  Resp 16 01/07/21 1052  SpO2 96 % 01/07/21 1052  Vitals shown include unvalidated device data.  Last Pain:  Vitals:   01/07/21 0824  TempSrc: Oral  PainSc: 0-No pain         Complications: No complications documented.

## 2021-01-07 NOTE — Discharge Instructions (Signed)
RESTART XARELTO ON 01/09/21  [place] Office Phone Number [phone]  BREAST BIOPSY/ PARTIAL MASTECTOMY: POST OP INSTRUCTIONS  Always review your discharge instruction sheet given to you by the facility where your surgery was performed.  IF YOU HAVE DISABILITY OR FAMILY LEAVE FORMS, YOU MUST BRING THEM TO THE OFFICE FOR PROCESSING.  DO NOT GIVE THEM TO YOUR DOCTOR.  1. A prescription for pain medication may be given to you upon discharge.  Take your pain medication as prescribed, if needed.  If narcotic pain medicine is not needed, then you may take acetaminophen (Tylenol) or ibuprofen (Advil) as needed. 2. Take your usually prescribed medications unless otherwise directed 3. If you need a refill on your pain medication, please contact your pharmacy.  They will contact our office to request authorization.  Prescriptions will not be filled after 5pm or on week-ends. 4. You should eat very light the first 24 hours after surgery, such as soup, crackers, pudding, etc.  Resume your normal diet the day after surgery. 5. Most patients will experience some swelling and bruising in the breast.  Ice packs and a good support bra will help.  Swelling and bruising can take several days to resolve.  6. It is common to experience some constipation if taking pain medication after surgery.  Increasing fluid intake and taking a stool softener will usually help or prevent this problem from occurring.  A mild laxative (Milk of Magnesia or Miralax) should be taken according to package directions if there are no bowel movements after 48 hours. 7. Unless discharge instructions indicate otherwise, you may remove your bandages 24-48 hours after surgery, and you may shower at that time.  You may have steri-strips (small skin tapes) in place directly over the incision.  These strips should be left on the skin for 7-10 days.  If your surgeon used skin glue on the incision, you may shower in 24 hours.  The  glue will flake off over the next 2-3 weeks.  Any sutures or staples will be removed at the office during your follow-up visit. 8. ACTIVITIES:  You may resume regular daily activities (gradually increasing) beginning the next day.  Wearing a good support bra or sports bra minimizes pain and swelling.  You may have sexual intercourse when it is comfortable. a. You may drive when you no longer are taking prescription pain medication, you can comfortably wear a seatbelt, and you can safely maneuver your car and apply brakes. b. RETURN TO WORK:  ______________________________________________________________________________________ 9. You should see your doctor in the office for a follow-up appointment approximately two weeks after your surgery.  Your doctors nurse will typically make your follow-up appointment when she calls you with your pathology report.  Expect your pathology report 2-3 business days after your surgery.  You may call to check if you do not hear from [place] after three days. 10. OTHER INSTRUCTIONS: _______________________________________________________________________________________________ _____________________________________________________________________________________________________________________________________ _____________________________________________________________________________________________________________________________________ _____________________________________________________________________________________________________________________________________  WHEN TO CALL YOUR DOCTOR: 1. Fever over 101.0 2. Nausea and/or vomiting. 3. Extreme swelling or bruising. 4. Continued bleeding from incision. 5. Increased pain, redness, or drainage from the incision.  The clinic staff is available to answer your questions during regular business hours.  Please dont hesitate to call and ask to speak to one of the nurses for clinical concerns.  If you have a medical  emergency, go to the nearest emergency room or call 911.  A surgeon from [facility] is always on call at the hospital.  For further  questions, please visit [email or web address]    Next dose of Tylenol can be given after 2:30 PM.        Post Anesthesia Home Care Instructions  Activity: Get plenty of rest for the remainder of the day. A responsible individual must stay with you for 24 hours following the procedure.  For the next 24 hours, DO NOT: -Drive a car -Paediatric nurse -Drink alcoholic beverages -Take any medication unless instructed by your physician -Make any legal decisions or sign important papers.  Meals: Start with liquid foods such as gelatin or soup. Progress to regular foods as tolerated. Avoid greasy, spicy, heavy foods. If nausea and/or vomiting occur, drink only clear liquids until the nausea and/or vomiting subsides. Call your physician if vomiting continues.  Special Instructions/Symptoms: Your throat may feel dry or sore from the anesthesia or the breathing tube placed in your throat during surgery. If this causes discomfort, gargle with warm salt water. The discomfort should disappear within 24 hours.  If you had a scopolamine patch placed behind your ear for the management of post- operative nausea and/or vomiting:  1. The medication in the patch is effective for 72 hours, after which it should be removed.  Wrap patch in a tissue and discard in the trash. Wash hands thoroughly with soap and water. 2. You may remove the patch earlier than 72 hours if you experience unpleasant side effects which may include dry mouth, dizziness or visual disturbances. 3. Avoid touching the patch. Wash your hands with soap and water after contact with the patch.

## 2021-01-07 NOTE — Anesthesia Procedure Notes (Signed)
Procedure Name: LMA Insertion Date/Time: 01/07/2021 9:33 AM Performed by: Ezequiel Kayser, CRNA Pre-anesthesia Checklist: Patient identified, Emergency Drugs available, Suction available and Patient being monitored Patient Re-evaluated:Patient Re-evaluated prior to induction Oxygen Delivery Method: Circle System Utilized Preoxygenation: Pre-oxygenation with 100% oxygen Induction Type: IV induction Ventilation: Mask ventilation without difficulty LMA: LMA inserted LMA Size: 4.0 Number of attempts: 1 Airway Equipment and Method: Bite block Placement Confirmation: positive ETCO2 Tube secured with: Tape Dental Injury: Teeth and Oropharynx as per pre-operative assessment  Comments: Eyes taped prior to insertion

## 2021-01-07 NOTE — Interval H&P Note (Signed)
History and Physical Interval Note:  01/07/2021 8:01 AM  [name] Collier  has presented today for surgery, with the diagnosis of INVASIVE LOBULAR CARCINOMA.  The various methods of treatment have been discussed with the patient and family. After consideration of risks, benefits and other options for treatment, the patient has consented to  Procedure(s) with comments: LEFT BREAST LUMPECTOMY WITH RADIOACTIVE SEED AND LEFT AXILLARY SENTINEL LYMPH NODE BIOPSY WITH BLUE DYE INJECTION (Left) - LMA WITH PECTORAL BLOCK, BLUE DYE INJECTION as a surgical intervention.  The patient's history has been reviewed, patient examined, no change in status, stable for surgery.  I have reviewed the patient's chart and labs.  Questions were answered to the patient's satisfaction.     [name]

## 2021-01-07 NOTE — Anesthesia Procedure Notes (Signed)
Anesthesia Regional Block: Pectoralis block   Pre-Anesthetic Checklist: ,, timeout performed, Correct Patient, Correct Site, Correct Laterality, Correct Procedure, Correct Position, site marked, Risks and benefits discussed,  Surgical consent,  Pre-op evaluation,  At surgeon's request and post-op pain management  Laterality: Left  Prep: chloraprep       Needles:   Needle Type: Stimiplex     Needle Length: 9cm      Additional Needles:   Procedures:,,,, ultrasound used (permanent image in chart),,,,  Narrative:  Start time: 01/07/2021 8:55 AM End time: 01/07/2021 9:00 AM Injection made incrementally with aspirations every 5 mL.  Performed by: Personally  Anesthesiologist: Nolon Nations, MD  Additional Notes: Patient tolerated well. Good fascial spread noted.

## 2021-01-07 NOTE — Progress Notes (Signed)
Nuc med at bedside for injections, VSS, and pt tolerated procedure well.

## 2021-01-07 NOTE — Op Note (Signed)
Pre-op Diagnosis:  Left breast invasive lobular carcinoma Post-op Diagnosis: same Procedure:  Left radioactive seed localized lumpectomy Attempted left axillary sentinel lymph node biopsy with blue dye injection Limited left axillary lymph node dissection. Surgeon:  [name]. Anesthesia:  GEN - LMA/ PEC block Indications:  This is a 75 year old female with atrial fibrillation anticoagulated on Xarelto. Presents with recent diagnosis of a left breast mass. This was biopsied and revealed invasive lobular carcinoma, ER/PR positive, HER-2/neu negative, Ki-67 10%. The mass is located in the left breast at 10:00 1 cm from the nipple and measures 0.4 cm in diameter. She presents now for lumpectomy and sentinel lymph node biopsy.  A seed was placed yesterday by radiology and she was injected with technetium sulfur colloid in pre-op holding.  Description of procedure: The patient is brought to the operating room placed in supine position on the operating room table. After an adequate level of general anesthesia was obtained, we injected methylene blue dye solution into the dermis around her nipple.  Her left breast was prepped with ChloraPrep and draped in sterile fashion. A timeout was taken to ensure the proper patient and proper procedure. We interrogated the breast with the neoprobe. We made a circumareolar incision around the upper side of the nipple after infiltrating with 0.25% Marcaine. Dissection was carried down in the breast tissue with cautery. We used the neoprobe to guide [place] towards the radioactive seed. We excised an area of tissue around the radioactive seed 2 cm in diameter.  There was palpable firmness deep to the seed, so I continued the lumpectomy specimen almost to the chest wall. The specimen was removed and was oriented with a paint kit. Specimen mammogram showed the radioactive seed as well as the biopsy clip within the specimen. This was sent for pathologic examination. There is  no residual radioactivity within the biopsy cavity. Clips were placed in all five margins.  We inspected carefully for hemostasis. The wound was thoroughly irrigated.   We then turned our attention to the axilla.  The settings were adjusted on the Neoprobe and we interrogated the axilla.  There is no radioactivity in the axilla.  I made a transverse incision over the lower axilla.  We dissected into the axilla and continued interrogating with the Neoprobe.  No radioactivity was noted and no blue dye was visualized.  I did a limited lymph node dissection in the anterior axilla.  The specimen had a couple of palpable masses that could be lymph nodes.  We sent this specimen immediately to pathology who determined that there were actually three lymph nodes within the specimen.   The wounds were irrigated and closed with a deep layer of 3-0 Vicryl and a subcuticular layer of 4-0 Monocryl. Benzoin Steri-Strips were applied. The patient was then extubated and brought to the recovery room in stable condition. All sponge, instrument, and needle counts are correct.  [name] Collier. [name] Dover, MD, [facility] Surgery  General/ Trauma Surgery  10/28/2020 1:38 PM

## 2021-01-08 ENCOUNTER — Encounter (HOSPITAL_BASED_OUTPATIENT_CLINIC_OR_DEPARTMENT_OTHER): Payer: Self-pay | Admitting: Surgery

## 2021-01-08 NOTE — Anesthesia Postprocedure Evaluation (Signed)
Anesthesia Post Note  Patient: [name] Collier  Procedure(s) Performed: LEFT BREAST LUMPECTOMY WITH RADIOACTIVE SEED AND LEFT AXILLARY SENTINEL LYMPH NODE BIOPSY WITH BLUE DYE INJECTION (Left Breast)     Patient location during evaluation: PACU Anesthesia Type: General Level of consciousness: sedated and patient cooperative Pain management: pain level controlled Vital Signs Assessment: post-procedure vital signs reviewed and stable Respiratory status: spontaneous breathing Cardiovascular status: stable Anesthetic complications: no   No complications documented.  Last Vitals:  Vitals:   01/07/21 1115 01/07/21 1128  BP: 127/61 129/60  Pulse: 69 79  Resp: 13 15  Temp:  36.7 C  SpO2: 97% 95%    Last Pain:  Vitals:   01/07/21 1128  TempSrc:   PainSc: 0-No pain                 Nolon Nations

## 2021-01-09 ENCOUNTER — Other Ambulatory Visit: Payer: Self-pay | Admitting: Nurse Practitioner

## 2021-01-09 LAB — SURGICAL PATHOLOGY

## 2021-01-10 ENCOUNTER — Encounter: Payer: Self-pay | Admitting: *Deleted

## 2021-01-10 ENCOUNTER — Telehealth: Payer: Self-pay | Admitting: *Deleted

## 2021-01-10 NOTE — Telephone Encounter (Signed)
Ordered oncotype per Dr. Gudena. Faxed requisition to pathology. °

## 2021-01-13 NOTE — Progress Notes (Incomplete)
Patient Care Team: [name], DO as PCP - General (Family Medicine) Minus Breeding, MD as PCP - Cardiology (Cardiology) Thompson Grayer, MD as PCP - Electrophysiology (Cardiology)  DIAGNOSIS: No diagnosis found.  SUMMARY OF ONCOLOGIC HISTORY: Oncology History  Carcinoma of upper-inner quadrant of left breast in female, estrogen receptor positive ([place])  12/20/2020 Initial Diagnosis   Screening mammogram detected a left breast asymmetry. Diagnostic mammogram and US showed a 0.7cm mass at the 10 o'clock position and normal left axillary lymph nodes. Biopsy showed invasive lobular carcinoma, grade 1-2, HER-2 negative (1+), ER+ 50%, PR+ 90%, Ki67 10%.   12/25/2020 Cancer Staging   Staging form: Breast, AJCC 8th Edition - Clinical: Stage IA (cT1b, cN0, cM0, G2, ER+, PR+, HER2-) - Signed by [name], MD on 12/25/2020   01/07/2021 Surgery   Left lumpectomy (Tsuei): invasive and in situ lobular carcinoma, grade 2, 1.8cm, clear margins, and no evidence of carcinoma in 3 left axillary lymph nodes.     CHIEF COMPLIANT: Follow-up s/p left lumpectomy   INTERVAL HISTORY: [name] Collier is a 75 y.o. with above-mentioned history of invasive mammary carcinoma of the left breast. She underwent a left lumpectomy on 01/07/21 with Dr. Georgette Dover for which pathology showed invasive and in situ lobular carcinoma, grade 2, 1.8cm, clear margins, and no evidence of carcinoma in 3 left axillary lymph nodes. She presents to the clinic today to review the pathology report and discuss further treatment.   ALLERGIES:  is allergic to augmentin [amoxicillin-pot clavulanate], codeine, and prednisone.  MEDICATIONS:  Current Outpatient Medications  Medication Sig Dispense Refill  . acetaminophen (TYLENOL) 500 MG tablet Take 500 mg by mouth every 6 (six) hours as needed for moderate pain or headache.     Marland Kitchen amLODipine (NORVASC) 5 MG tablet Take 1 tablet (5 mg total) by mouth daily.    . Cholecalciferol (VITAMIN D3)  5000 units CAPS Take 5,000 Units by mouth at bedtime.    . dofetilide (TIKOSYN) 500 MCG capsule TAKE 1 CAPSULE (500 MCG TOTAL) BY MOUTH 2 (TWO) TIMES DAILY. 60 capsule 6  . DULoxetine (CYMBALTA) 30 MG capsule TAKE 1 CAPSULE BY MOUTH  DAILY 90 capsule 0  . ezetimibe (ZETIA) 10 MG tablet TAKE 1 TABLET BY MOUTH  DAILY 90 tablet 1  . famotidine (PEPCID) 20 MG tablet TAKE 1 TABLET BY MOUTH  TWICE DAILY 180 tablet 3  . furosemide (LASIX) 20 MG tablet TAKE 1 TABLET BY MOUTH  DAILY 90 tablet 1  . loratadine (CLARITIN) 10 MG tablet Take 1 tablet (10 mg total) by mouth daily. 30 tablet 11  . olmesartan (BENICAR) 5 MG tablet TAKE 1 TABLET BY MOUTH  DAILY 90 tablet 1  . potassium chloride SA (KLOR-CON) 20 MEQ tablet TAKE 1 TABLET BY MOUTH  TWICE DAILY 180 tablet 3  . rivaroxaban (XARELTO) 20 MG TABS tablet TAKE 1 TABLET (20 MG TOTAL) BY MOUTH DAILY WITH SUPPER. 90 tablet 2  . traMADol (ULTRAM) 50 MG tablet Take 1 tablet (50 mg total) by mouth every 6 (six) hours as needed for moderate pain or severe pain. 20 tablet 0   No current facility-administered medications for this visit.    PHYSICAL EXAMINATION: ECOG PERFORMANCE STATUS: {CHL ONC ECOG [phone]}  There were no vitals filed for this visit. There were no vitals filed for this visit.  LABORATORY DATA:  I have reviewed the data as listed CMP Latest Ref Rng & Units 01/03/2021 11/27/2020 05/28/2020  Glucose 70 - 99 mg/dL 100(H)  99 117(H)  BUN 8 - 23 mg/dL _0 Creatinine 0.44 - 1.00 mg/dL 0.73 0.83 0.81  Sodium 135 - 145 mmol/L 140 141 138  Potassium 3.5 - 5.1 mmol/L 4.5 4.3 4.5  Chloride 98 - 111 mmol/L 104 105 103  CO2 22 - 32 mmol/L _1 Calcium 8.9 - 10.3 mg/dL 9.2 9.2 9.3  Total Protein 6.0 - 8.5 g/dL - 6.6 -  Total Bilirubin 0.0 - 1.2 mg/dL - 1.0 -  Alkaline Phos 44 - 121 IU/L - 85 -  AST 0 - 40 IU/L - 18 -  ALT 0 - 32 IU/L - 13 -    Lab Results  Component Value Date   WBC 5.1 11/27/2020   HGB 13.4 11/27/2020   HCT  40.5 11/27/2020   MCV 88 11/27/2020   PLT 269 11/27/2020   NEUTROABS 4.4 12/14/2017    ASSESSMENT & PLAN:  No problem-specific Assessment & Plan notes found for this encounter.    No orders of the defined types were placed in this encounter.  The patient has a good understanding of the overall plan. she agrees with it. she will call with any problems that may develop before the next visit here.  Total time spent: *** mins including face to face time and time spent for planning, charting and coordination of care  Rulon Eisenmenger, MD, MPH 01/13/2021  I, Cloyde Reams Dorshimer, am acting as scribe for Dr. [name].  {insert scribe attestation}

## 2021-01-14 ENCOUNTER — Telehealth: Payer: Self-pay | Admitting: *Deleted

## 2021-01-14 ENCOUNTER — Inpatient Hospital Stay: Payer: Medicare Other | Admitting: Hematology and Oncology

## 2021-01-14 NOTE — Telephone Encounter (Signed)
Spoke to pt concerning next steps after surgery. Discussed waiting for oncotype results to return. If score is low will move forward with xrt which we will send a referral to have arranged. Received verbal understanding. No further needs voiced.

## 2021-01-20 ENCOUNTER — Ambulatory Visit: Payer: Medicare Other | Admitting: Hematology and Oncology

## 2021-01-20 DIAGNOSIS — Z170 Estrogen receptor positive status [ER+]: Secondary | ICD-10-CM | POA: Diagnosis not present

## 2021-01-20 DIAGNOSIS — C50212 Malignant neoplasm of upper-inner quadrant of left female breast: Secondary | ICD-10-CM | POA: Diagnosis not present

## 2021-01-21 ENCOUNTER — Telehealth: Payer: Self-pay | Admitting: *Deleted

## 2021-01-21 ENCOUNTER — Encounter: Payer: Self-pay | Admitting: *Deleted

## 2021-01-21 DIAGNOSIS — C50212 Malignant neoplasm of upper-inner quadrant of left female breast: Secondary | ICD-10-CM

## 2021-01-21 DIAGNOSIS — Z170 Estrogen receptor positive status [ER+]: Secondary | ICD-10-CM

## 2021-01-21 NOTE — Telephone Encounter (Signed)
Received oncotype results of 10/3%. Patient is aware.  Team notified and referral placed for Dr. Isidore Moos.

## 2021-01-23 ENCOUNTER — Telehealth: Payer: Medicare Other | Admitting: Hematology and Oncology

## 2021-01-23 ENCOUNTER — Ambulatory Visit: Payer: Medicare Other | Admitting: Hematology and Oncology

## 2021-01-27 ENCOUNTER — Encounter: Payer: Self-pay | Admitting: *Deleted

## 2021-02-03 ENCOUNTER — Other Ambulatory Visit: Payer: Self-pay | Admitting: Family Medicine

## 2021-02-03 ENCOUNTER — Encounter: Payer: Self-pay | Admitting: Hematology and Oncology

## 2021-02-03 DIAGNOSIS — F411 Generalized anxiety disorder: Secondary | ICD-10-CM

## 2021-02-03 DIAGNOSIS — F320 Major depressive disorder, single episode, mild: Secondary | ICD-10-CM

## 2021-02-03 NOTE — Progress Notes (Signed)
Patient Care Team: [name], DO as PCP - General (Family Medicine) Minus Breeding, MD as PCP - Cardiology (Cardiology) Thompson Grayer, MD as PCP - Electrophysiology (Cardiology)  DIAGNOSIS:    ICD-10-CM   1. Carcinoma of upper-inner quadrant of left breast in female, estrogen receptor positive ([place])  C50.212    Z17.0     SUMMARY OF ONCOLOGIC HISTORY: Oncology History  Carcinoma of upper-inner quadrant of left breast in female, estrogen receptor positive ([place])  12/20/2020 Initial Diagnosis   Screening mammogram detected a left breast asymmetry. Diagnostic mammogram and US showed a 0.7cm mass at the 10 o'clock position and normal left axillary lymph nodes. Biopsy showed invasive lobular carcinoma, grade 1-2, HER-2 negative (1+), ER+ 50%, PR+ 90%, Ki67 10%.   12/25/2020 Cancer Staging   Staging form: Breast, AJCC 8th Edition - Clinical: Stage IA (cT1b, cN0, cM0, G2, ER+, PR+, HER2-) - Signed by [name], MD on 12/25/2020   01/07/2021 Surgery   Left lumpectomy (Tsuei): invasive and in situ lobular carcinoma, grade 2, 1.8cm, clear margins, and no evidence of carcinoma in 3 left axillary lymph nodes.     CHIEF COMPLIANT: Follow-up s/p left lumpectomy   INTERVAL HISTORY: [name] Collier is a 75 y.o. with above-mentioned history of left breast cancer. She underwent a left lumpectomy on 01/07/21 with Dr. Georgette Dover for which pathology showed invasive and in situ lobular carcinoma, grade 2, 1.8cm, clear margins, and no evidence of carcinoma in 3 left axillary lymph nodes. Oncotype testing showed results of 10/3%. She presents to the clinic todayto review the pathology report and discuss further treatment.   ALLERGIES:  is allergic to augmentin [amoxicillin-pot clavulanate], codeine, and prednisone.  MEDICATIONS:  Current Outpatient Medications  Medication Sig Dispense Refill  . DULoxetine (CYMBALTA) 30 MG capsule TAKE 1 CAPSULE BY MOUTH  DAILY 90 capsule 3  . acetaminophen (TYLENOL)  500 MG tablet Take 500 mg by mouth every 6 (six) hours as needed for moderate pain or headache.     Marland Kitchen amLODipine (NORVASC) 5 MG tablet Take 1 tablet (5 mg total) by mouth daily.    . Cholecalciferol (VITAMIN D3) 5000 units CAPS Take 5,000 Units by mouth at bedtime.    . dofetilide (TIKOSYN) 500 MCG capsule TAKE 1 CAPSULE (500 MCG TOTAL) BY MOUTH 2 (TWO) TIMES DAILY. 60 capsule 6  . ezetimibe (ZETIA) 10 MG tablet TAKE 1 TABLET BY MOUTH  DAILY 90 tablet 1  . famotidine (PEPCID) 20 MG tablet TAKE 1 TABLET BY MOUTH  TWICE DAILY 180 tablet 3  . furosemide (LASIX) 20 MG tablet TAKE 1 TABLET BY MOUTH  DAILY 90 tablet 1  . loratadine (CLARITIN) 10 MG tablet Take 1 tablet (10 mg total) by mouth daily. 30 tablet 11  . olmesartan (BENICAR) 5 MG tablet TAKE 1 TABLET BY MOUTH  DAILY 90 tablet 1  . potassium chloride SA (KLOR-CON) 20 MEQ tablet TAKE 1 TABLET BY MOUTH  TWICE DAILY 180 tablet 3  . rivaroxaban (XARELTO) 20 MG TABS tablet TAKE 1 TABLET (20 MG TOTAL) BY MOUTH DAILY WITH SUPPER. 90 tablet 2  . traMADol (ULTRAM) 50 MG tablet Take 1 tablet (50 mg total) by mouth every 6 (six) hours as needed for moderate pain or severe pain. 20 tablet 0   No current facility-administered medications for this visit.    PHYSICAL EXAMINATION: ECOG PERFORMANCE STATUS: 1 - Symptomatic but completely ambulatory  Vitals:   02/04/21 1530  BP: (!) 156/66  Pulse: 66  Resp:  18  Temp: 98.1 F (36.7 C)  SpO2: 96%   Filed Weights   02/04/21 1530  Weight: 287 lb 14.4 oz (130.6 kg)     LABORATORY DATA:  I have reviewed the data as listed CMP Latest Ref Rng & Units 01/03/2021 11/27/2020 05/28/2020  Glucose 70 - 99 mg/dL 100(H) 99 117(H)  BUN 8 - 23 mg/dL _0 Creatinine 0.44 - 1.00 mg/dL 0.73 0.83 0.81  Sodium 135 - 145 mmol/L 140 141 138  Potassium 3.5 - 5.1 mmol/L 4.5 4.3 4.5  Chloride 98 - 111 mmol/L 104 105 103  CO2 22 - 32 mmol/L _1 Calcium 8.9 - 10.3 mg/dL 9.2 9.2 9.3  Total Protein 6.0 - 8.5  g/dL - 6.6 -  Total Bilirubin 0.0 - 1.2 mg/dL - 1.0 -  Alkaline Phos 44 - 121 IU/L - 85 -  AST 0 - 40 IU/L - 18 -  ALT 0 - 32 IU/L - 13 -    Lab Results  Component Value Date   WBC 5.1 11/27/2020   HGB 13.4 11/27/2020   HCT 40.5 11/27/2020   MCV 88 11/27/2020   PLT 269 11/27/2020   NEUTROABS 4.4 12/14/2017    ASSESSMENT & PLAN:  Carcinoma of upper-inner quadrant of left breast in female, estrogen receptor positive ([place]) 12/20/2020: Screening mammogram detected left breast asymmetry.  Ultrasound revealed 0.7 cm mass at 10 o'clock position, axilla negative, biopsy revealed grade 1-2 invasive lobular cancer, ER 50%, PR 90%, Ki-67 10%, HER2 negative by IHC 1+ T1BN0 stage Ia clinical stage  01/07/2021: Left lumpectomy (Tsuei): invasive and in situ lobular carcinoma, grade 2, 1.8cm, clear margins, and no evidence of carcinoma in 3 left axillary lymph nodes.  Oncotype Dx 10: ROR 3%  Plan: 1. Adj RT 2. Foll by Adj Anti-estrogen therapy  Anastrozole counseling: We discussed the risks and benefits of anti-estrogen therapy with aromatase inhibitors. These include but not limited to insomnia, hot flashes, mood changes, vaginal dryness, bone density loss, and weight gain. We strongly believe that the benefits far outweigh the risks. Patient understands these risks and consented to starting treatment. Planned treatment duration is 5 years.  RTC after XRT to start anti estrogens      No orders of the defined types were placed in this encounter.  The patient has a good understanding of the overall plan. she agrees with it. she will call with any problems that may develop before the next visit here.  Total time spent: 30 mins including face to face time and time spent for planning, charting and coordination of care  Rulon Eisenmenger, MD, MPH 02/04/2021  I, Cloyde Reams Dorshimer, am acting as scribe for Dr. [name].  I have reviewed the above documentation for accuracy and completeness, and I  agree with the above.

## 2021-02-03 NOTE — Assessment & Plan Note (Addendum)
12/20/2020: Screening mammogram detected left breast asymmetry.  Ultrasound revealed 0.7 cm mass at 10 o'clock position, axilla negative, biopsy revealed grade 1-2 invasive lobular cancer, ER 50%, PR 90%, Ki-67 10%, HER2 negative by IHC 1+ T1BN0 stage Ia clinical stage  01/07/2021: Left lumpectomy (Tsuei): invasive and in situ lobular carcinoma, grade 2, 1.8cm, clear margins, and no evidence of carcinoma in 3 left axillary lymph nodes.  Oncotype Dx 10: ROR 3%  Plan: 1. Adj RT 2. Foll by Adj Anti-estrogen therapy  RTC after XRT to start anti estrogens

## 2021-02-04 ENCOUNTER — Other Ambulatory Visit: Payer: Self-pay

## 2021-02-04 ENCOUNTER — Inpatient Hospital Stay: Payer: Medicare Other | Attending: Hematology and Oncology | Admitting: Hematology and Oncology

## 2021-02-04 VITALS — BP 156/66 | HR 66 | Temp 98.1°F | Resp 18 | Ht 65.5 in | Wt 287.9 lb

## 2021-02-04 DIAGNOSIS — C50212 Malignant neoplasm of upper-inner quadrant of left female breast: Secondary | ICD-10-CM | POA: Insufficient documentation

## 2021-02-04 DIAGNOSIS — Z780 Asymptomatic menopausal state: Secondary | ICD-10-CM | POA: Diagnosis not present

## 2021-02-04 DIAGNOSIS — Z79811 Long term (current) use of aromatase inhibitors: Secondary | ICD-10-CM | POA: Diagnosis not present

## 2021-02-04 DIAGNOSIS — Z79899 Other long term (current) drug therapy: Secondary | ICD-10-CM | POA: Diagnosis not present

## 2021-02-04 DIAGNOSIS — Z7901 Long term (current) use of anticoagulants: Secondary | ICD-10-CM | POA: Diagnosis not present

## 2021-02-04 DIAGNOSIS — Z170 Estrogen receptor positive status [ER+]: Secondary | ICD-10-CM | POA: Insufficient documentation

## 2021-02-04 MED ORDER — ANASTROZOLE 1 MG PO TABS: 1.0000 mg | ORAL_TABLET | Freq: Every day | ORAL | 3 refills | Status: DC

## 2021-02-10 NOTE — Progress Notes (Signed)
Radiation Oncology         [phone] ________________________________  Name: [name] Collier MRN: [id number]  Date: 02/11/2021  DOB: 1946/07/09  Follow-Up Visit Note  Outpatient  CC: [name], DO  [name], MD  Diagnosis:      ICD-10-CM   1. Carcinoma of upper-inner quadrant of left breast in female, estrogen receptor positive ([name])  C50.212    Z17.0     Cancer Staging Carcinoma of upper-inner quadrant of left breast in female, estrogen receptor positive ([name]) Staging form: Breast, AJCC 8th Edition - Clinical: Stage IA (cT1b, cN0, cM0, G2, ER+, PR+, HER2-) - Signed by [name], MD on 12/25/2020  pT1c, pN0   CHIEF COMPLAINT: Here to discuss management of left breast cancer  Narrative:  The patient returns today for follow-up. She was seen in consultation on 12/20/2020.   Since consultation date, she underwent the following imaging (dates and results as follows): MRI of bilateral breasts on 01/01/2021 that revealed the biopsy-proven invasive lobular carcinoma spanning 1.8 cm in the inner left breast at anterior to middle depth. There was no MRI evidence of malignancy involving the right breast, nor was there any pathologic lymphadenopathy.  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  Oncotype DX was obtained on the final surgical sample and the recurrence score of 10 predicted a risk of recurrence outside the breast over the next 9 years of 3%, if the patient's only systemic therapy was an antiestrogen for 5 years. It also predicted no significant benefit from chemotherapy.   Lymphedema issues, if any:  Patient denies    Pain issues, if any:  Patient denies   SAFETY  ISSUES:  Prior radiation? No  Pacemaker/ICD? No  Possible current pregnancy? No--hysterectomy  Is the patient on methotrexate? No  Current Complaints / other details:   Patient has received both Moderna vaccines as well as Moderna booster Rash on breast from post op adhesive, resolving.        ALLERGIES:  is allergic to codeine and prednisone.  Meds: Current Outpatient Medications  Medication Sig Dispense Refill  . DULoxetine (CYMBALTA) 30 MG capsule TAKE 1 CAPSULE BY MOUTH  DAILY 90 capsule 3  . acetaminophen (TYLENOL) 500 MG tablet Take 500 mg by mouth every 6 (six) hours as needed for moderate pain or headache.     Marland Kitchen amLODipine (NORVASC) 5 MG tablet Take 1 tablet (5 mg total) by mouth daily.    Marland Kitchen anastrozole (ARIMIDEX) 1 MG tablet Take 1 tablet (1 mg total) by mouth daily. 90 tablet 3  . Cholecalciferol (VITAMIN D3) 5000 units CAPS Take 5,000 Units by mouth at bedtime.    . dofetilide (TIKOSYN) 500 MCG capsule TAKE 1 CAPSULE (500 MCG TOTAL) BY MOUTH 2 (TWO) TIMES DAILY. 60 capsule 6  . ezetimibe (ZETIA) 10 MG tablet TAKE 1 TABLET BY MOUTH  DAILY 90 tablet 1  . famotidine (PEPCID) 20 MG tablet TAKE 1 TABLET BY MOUTH  TWICE DAILY 180 tablet 3  . furosemide (LASIX) 20 MG tablet TAKE 1 TABLET BY MOUTH  DAILY 90 tablet 1  . loratadine (CLARITIN) 10 MG tablet Take 1 tablet (10 mg total) by mouth daily. 30 tablet 11  . olmesartan (BENICAR) 5  MG tablet TAKE 1 TABLET BY MOUTH  DAILY 90 tablet 1  . potassium chloride SA (KLOR-CON) 20 MEQ tablet TAKE 1 TABLET BY MOUTH  TWICE DAILY 180 tablet 3  . rivaroxaban (XARELTO) 20 MG TABS tablet TAKE 1 TABLET (20 MG TOTAL) BY MOUTH DAILY WITH SUPPER. 90 tablet 2   No current facility-administered medications for this encounter.    Physical Findings:  height is 5' 5.5" (1.664 m) and weight is 289 lb 9.6 oz (131.4 kg). Her temperature is 98.7 F (37.1 C). Her blood pressure is 165/85 (abnormal) and her pulse is 67. Her respiration is 20 and oxygen  saturation is 99%. .     General: Alert and oriented, in no acute distress MSK: Good range of motion in shoulders bilaterally  Skin : Mild erythematous rash above left breast lumpectomy scar consistent with irritation from adhesive.  No excessive warmth to skin of breast  Psychiatric: Judgment and insight are intact. Affect is appropriate. Breast exam reveals satisfactory healing healing of left lumpectomy scar and left axillary scar  Lab Findings: Lab Results  Component Value Date   WBC 5.1 11/27/2020   HGB 13.4 11/27/2020   HCT 40.5 11/27/2020   MCV 88 11/27/2020   PLT 269 11/27/2020      Radiographic Findings: No results found.  Impression/Plan: Left breast cancer  We discussed adjuvant radiotherapy today.  I recommend radiation therapy to the left breast in order to reduce risk of local regional recurrence by two thirds.  I again reviewed the logistics, benefits, risks, and potential side effects of this treatment in detail. Risks may include but not necessary be limited to acute and late injury tissue in the radiation fields such as skin irritation (change in color/pigmentation, itching, dryness, pain, peeling). She may experience fatigue. We also discussed possible risk of long term cosmetic changes or scar tissue. There is also a smaller risk for lung toxicity, cardiac toxicity, lymphedema, musculoskeletal changes, rib fragility, late chronic non-healing soft tissue wound.    The patient asked good questions which I answered to her satisfaction. She is enthusiastic about proceeding with treatment. A consent form has been previously signed and placed in her chart.  Proceed with CT simulation today and start her treatment next week.  She and her daughter are enthusiastic about this plan.  On date of service, in total, I spent 30 minutes on this encounter. Patient was seen in person.  _____________________________________   [name], MD  This document serves as a record of  services personally performed by [name], MD. It was created on his behalf by Clerance Lav, a trained medical scribe. The creation of this record is based on the scribe's personal observations and the provider's statements to them. This document has been checked and approved by the attending provider.

## 2021-02-10 NOTE — Progress Notes (Signed)
Location of Breast Cancer: Carcinoma of upper-inner quadrant of LEFT breast, estrogen receptor positive  Histology per Pathology Report:  01/07/2021 FINAL MICROSCOPIC DIAGNOSIS:  A. 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 LYMPH NODE, LEFT AXILLARY, RESECTION:  - No carcinoma identified in three lymph nodes (0/3)  - See comment  COMMENT:  A. Cytokeratin 5/6 is negative in the lobular carcinoma in situ and shows mosaic style pattern in the usual ductal hyperplasia. E-cadherin is negative in the lobular carcinoma in situ and is positive in the ductal type hyperplasia. Cytokeratin AE1/3 was utilized on selected  blocks to exclude the presence of carcinoma at inked resection margins.  B. Cytokeratin AE1/3 was performed on all lymph nodes to exclude isolated tumor cells in micrometastasis. Cytokeratin AE1/3 is negative.  Receptor Status: ER(50%), PR (90%), Her2-neu (Negative), Ki-67(10%)  Did patient present with symptoms (if so, please note symptoms) or was this found on screening mammography?: 12/20/2020: Screening mammogram detected a left breast asymmetry. Diagnostic mammogram and US showed a 0.7cm mass at the 10 o'clock position and normal left axillary lymph nodes.  Past/Anticipated interventions by surgeon, if any: 01/07/2021 Dr. Donnie Mesa --Left radioactive seed localized lumpectomy --Attempted left axillary sentinel lymph node biopsy with blue dye injection --Limited left axillary lymph node dissection.  Past/Anticipated interventions by medical oncology, if any:  Under care of Dr. [name] 02/04/2021 Plan: 1. Adj RT 2. Foll by Adj Anti-estrogen therapy --RTC after XRT to start anti  estrogens  Lymphedema issues, if any:  Patient denies    Pain issues, if any:  Patient denies   SAFETY ISSUES:  Prior radiation? No  Pacemaker/ICD? No  Possible current pregnancy? No--hysterectomy  Is the patient on methotrexate? No  Current Complaints / other details:   Patient has received both Moderna vaccines as well as Moderna booster

## 2021-02-11 ENCOUNTER — Ambulatory Visit
Admission: RE | Admit: 2021-02-11 | Discharge: 2021-02-11 | Disposition: A | Discharge status: Home / Self Care | Payer: Medicare Other | Source: Ambulatory Visit | Attending: Radiation Oncology | Admitting: Radiation Oncology

## 2021-02-11 ENCOUNTER — Encounter: Payer: Self-pay | Admitting: Radiation Oncology

## 2021-02-11 ENCOUNTER — Other Ambulatory Visit: Payer: Self-pay

## 2021-02-11 VITALS — BP 165/85 | HR 67 | Temp 98.7°F | Resp 20 | Ht 65.5 in | Wt 289.6 lb

## 2021-02-11 DIAGNOSIS — Z923 Personal history of irradiation: Secondary | ICD-10-CM | POA: Diagnosis not present

## 2021-02-11 DIAGNOSIS — Z170 Estrogen receptor positive status [ER+]: Secondary | ICD-10-CM | POA: Insufficient documentation

## 2021-02-11 DIAGNOSIS — C50212 Malignant neoplasm of upper-inner quadrant of left female breast: Secondary | ICD-10-CM | POA: Diagnosis not present

## 2021-02-11 DIAGNOSIS — Z79899 Other long term (current) drug therapy: Secondary | ICD-10-CM | POA: Insufficient documentation

## 2021-02-11 DIAGNOSIS — Z510 Encounter for antineoplastic radiation therapy: Secondary | ICD-10-CM | POA: Insufficient documentation

## 2021-02-17 ENCOUNTER — Encounter: Payer: Self-pay | Admitting: *Deleted

## 2021-02-17 DIAGNOSIS — Z170 Estrogen receptor positive status [ER+]: Secondary | ICD-10-CM | POA: Diagnosis not present

## 2021-02-17 DIAGNOSIS — Z510 Encounter for antineoplastic radiation therapy: Secondary | ICD-10-CM | POA: Diagnosis not present

## 2021-02-17 DIAGNOSIS — C50212 Malignant neoplasm of upper-inner quadrant of left female breast: Secondary | ICD-10-CM | POA: Diagnosis not present

## 2021-02-18 ENCOUNTER — Ambulatory Visit
Admission: RE | Admit: 2021-02-18 | Discharge: 2021-02-18 | Disposition: A | Discharge status: Home / Self Care | Payer: Medicare Other | Source: Ambulatory Visit | Attending: Radiation Oncology | Admitting: Radiation Oncology

## 2021-02-18 ENCOUNTER — Other Ambulatory Visit: Payer: Self-pay

## 2021-02-18 DIAGNOSIS — C50212 Malignant neoplasm of upper-inner quadrant of left female breast: Secondary | ICD-10-CM | POA: Diagnosis not present

## 2021-02-18 DIAGNOSIS — Z510 Encounter for antineoplastic radiation therapy: Secondary | ICD-10-CM | POA: Diagnosis not present

## 2021-02-18 DIAGNOSIS — Z170 Estrogen receptor positive status [ER+]: Secondary | ICD-10-CM | POA: Diagnosis not present

## 2021-02-19 ENCOUNTER — Ambulatory Visit
Admission: RE | Admit: 2021-02-19 | Discharge: 2021-02-19 | Disposition: A | Discharge status: Home / Self Care | Payer: Medicare Other | Source: Ambulatory Visit | Attending: Radiation Oncology | Admitting: Radiation Oncology

## 2021-02-19 DIAGNOSIS — Z510 Encounter for antineoplastic radiation therapy: Secondary | ICD-10-CM | POA: Diagnosis not present

## 2021-02-19 DIAGNOSIS — C50212 Malignant neoplasm of upper-inner quadrant of left female breast: Secondary | ICD-10-CM | POA: Diagnosis not present

## 2021-02-19 DIAGNOSIS — Z170 Estrogen receptor positive status [ER+]: Secondary | ICD-10-CM | POA: Diagnosis not present

## 2021-02-20 ENCOUNTER — Other Ambulatory Visit: Payer: Self-pay

## 2021-02-20 ENCOUNTER — Ambulatory Visit
Admission: RE | Admit: 2021-02-20 | Discharge: 2021-02-20 | Disposition: A | Discharge status: Home / Self Care | Payer: Medicare Other | Source: Ambulatory Visit | Attending: Radiation Oncology | Admitting: Radiation Oncology

## 2021-02-20 DIAGNOSIS — Z510 Encounter for antineoplastic radiation therapy: Secondary | ICD-10-CM | POA: Diagnosis not present

## 2021-02-20 DIAGNOSIS — Z170 Estrogen receptor positive status [ER+]: Secondary | ICD-10-CM | POA: Diagnosis not present

## 2021-02-20 DIAGNOSIS — C50212 Malignant neoplasm of upper-inner quadrant of left female breast: Secondary | ICD-10-CM | POA: Diagnosis not present

## 2021-02-21 ENCOUNTER — Ambulatory Visit
Admission: RE | Admit: 2021-02-21 | Discharge: 2021-02-21 | Disposition: A | Discharge status: Home / Self Care | Payer: Medicare Other | Source: Ambulatory Visit | Attending: Radiation Oncology | Admitting: Radiation Oncology

## 2021-02-21 DIAGNOSIS — Z170 Estrogen receptor positive status [ER+]: Secondary | ICD-10-CM | POA: Diagnosis not present

## 2021-02-21 DIAGNOSIS — C50212 Malignant neoplasm of upper-inner quadrant of left female breast: Secondary | ICD-10-CM | POA: Diagnosis not present

## 2021-02-21 DIAGNOSIS — Z510 Encounter for antineoplastic radiation therapy: Secondary | ICD-10-CM | POA: Diagnosis not present

## 2021-02-24 ENCOUNTER — Ambulatory Visit
Admission: RE | Admit: 2021-02-24 | Discharge: 2021-02-24 | Disposition: A | Discharge status: Home / Self Care | Payer: Medicare Other | Source: Ambulatory Visit | Attending: Radiation Oncology | Admitting: Radiation Oncology

## 2021-02-24 ENCOUNTER — Other Ambulatory Visit: Payer: Self-pay

## 2021-02-24 DIAGNOSIS — Z170 Estrogen receptor positive status [ER+]: Secondary | ICD-10-CM | POA: Diagnosis not present

## 2021-02-24 DIAGNOSIS — C50212 Malignant neoplasm of upper-inner quadrant of left female breast: Secondary | ICD-10-CM

## 2021-02-24 DIAGNOSIS — Z510 Encounter for antineoplastic radiation therapy: Secondary | ICD-10-CM | POA: Diagnosis not present

## 2021-02-24 MED ORDER — RADIAPLEXRX EX GEL: Freq: Once | CUTANEOUS | Status: AC

## 2021-02-24 MED ORDER — ALRA NON-METALLIC DEODORANT (RAD-ONC)
1.0000 "application " | Freq: Once | TOPICAL | Status: AC
  Administered 2021-02-24: 1 via TOPICAL

## 2021-02-24 NOTE — Progress Notes (Signed)
Pt here for patient teaching.  Completed by Donna McIntyre-RN  Pt given Radiation and You booklet, skin care instructions, Alra deodorant and Radiaplex gel.    Reviewed areas of pertinence such as fatigue, hair loss, skin changes, breast tenderness and breast swelling .   Pt able to give teach back of to pat skin, use unscented/gentle soap and drink plenty of water,apply Radiaplex bid, avoid applying anything to skin within 4 hours of treatment, avoid wearing an under wire bra and to use an electric razor if they must shave.   Pt demonstrated understanding and verbalizes understanding of information given and will contact nursing with any questions or concerns.    Http://rtanswers.org/treatmentinformation/whattoexpect/index          

## 2021-02-25 ENCOUNTER — Ambulatory Visit
Admission: RE | Admit: 2021-02-25 | Discharge: 2021-02-25 | Disposition: A | Discharge status: Home / Self Care | Payer: Medicare Other | Source: Ambulatory Visit | Attending: Radiation Oncology | Admitting: Radiation Oncology

## 2021-02-25 DIAGNOSIS — C50212 Malignant neoplasm of upper-inner quadrant of left female breast: Secondary | ICD-10-CM | POA: Diagnosis not present

## 2021-02-25 DIAGNOSIS — Z510 Encounter for antineoplastic radiation therapy: Secondary | ICD-10-CM | POA: Diagnosis not present

## 2021-02-25 DIAGNOSIS — Z170 Estrogen receptor positive status [ER+]: Secondary | ICD-10-CM | POA: Diagnosis not present

## 2021-02-26 ENCOUNTER — Ambulatory Visit
Admission: RE | Admit: 2021-02-26 | Discharge: 2021-02-26 | Disposition: A | Discharge status: Home / Self Care | Payer: Medicare Other | Source: Ambulatory Visit | Attending: Radiation Oncology | Admitting: Radiation Oncology

## 2021-02-26 ENCOUNTER — Other Ambulatory Visit: Payer: Self-pay

## 2021-02-26 DIAGNOSIS — Z170 Estrogen receptor positive status [ER+]: Secondary | ICD-10-CM | POA: Diagnosis not present

## 2021-02-26 DIAGNOSIS — Z510 Encounter for antineoplastic radiation therapy: Secondary | ICD-10-CM | POA: Diagnosis not present

## 2021-02-26 DIAGNOSIS — C50212 Malignant neoplasm of upper-inner quadrant of left female breast: Secondary | ICD-10-CM | POA: Diagnosis not present

## 2021-02-27 ENCOUNTER — Ambulatory Visit
Admission: RE | Admit: 2021-02-27 | Discharge: 2021-02-27 | Disposition: A | Discharge status: Home / Self Care | Payer: Medicare Other | Source: Ambulatory Visit | Attending: Radiation Oncology | Admitting: Radiation Oncology

## 2021-02-27 DIAGNOSIS — Z170 Estrogen receptor positive status [ER+]: Secondary | ICD-10-CM | POA: Diagnosis not present

## 2021-02-27 DIAGNOSIS — C50212 Malignant neoplasm of upper-inner quadrant of left female breast: Secondary | ICD-10-CM | POA: Diagnosis not present

## 2021-02-27 DIAGNOSIS — Z510 Encounter for antineoplastic radiation therapy: Secondary | ICD-10-CM | POA: Diagnosis not present

## 2021-02-28 ENCOUNTER — Other Ambulatory Visit: Payer: Self-pay

## 2021-02-28 ENCOUNTER — Ambulatory Visit
Admission: RE | Admit: 2021-02-28 | Discharge: 2021-02-28 | Disposition: A | Discharge status: Home / Self Care | Payer: Medicare Other | Source: Ambulatory Visit | Attending: Radiation Oncology | Admitting: Radiation Oncology

## 2021-02-28 DIAGNOSIS — Z170 Estrogen receptor positive status [ER+]: Secondary | ICD-10-CM | POA: Diagnosis not present

## 2021-02-28 DIAGNOSIS — C50212 Malignant neoplasm of upper-inner quadrant of left female breast: Secondary | ICD-10-CM | POA: Diagnosis not present

## 2021-02-28 DIAGNOSIS — Z510 Encounter for antineoplastic radiation therapy: Secondary | ICD-10-CM | POA: Diagnosis not present

## 2021-03-03 ENCOUNTER — Other Ambulatory Visit: Payer: Self-pay

## 2021-03-03 ENCOUNTER — Ambulatory Visit
Admission: RE | Admit: 2021-03-03 | Discharge: 2021-03-03 | Disposition: A | Discharge status: Home / Self Care | Payer: Medicare Other | Source: Ambulatory Visit | Attending: Radiation Oncology | Admitting: Radiation Oncology

## 2021-03-03 DIAGNOSIS — Z170 Estrogen receptor positive status [ER+]: Secondary | ICD-10-CM | POA: Diagnosis not present

## 2021-03-03 DIAGNOSIS — Z510 Encounter for antineoplastic radiation therapy: Secondary | ICD-10-CM | POA: Diagnosis not present

## 2021-03-03 DIAGNOSIS — C50212 Malignant neoplasm of upper-inner quadrant of left female breast: Secondary | ICD-10-CM | POA: Diagnosis not present

## 2021-03-04 ENCOUNTER — Ambulatory Visit
Admission: RE | Admit: 2021-03-04 | Discharge: 2021-03-04 | Disposition: A | Discharge status: Home / Self Care | Payer: Medicare Other | Source: Ambulatory Visit | Attending: Radiation Oncology | Admitting: Radiation Oncology

## 2021-03-04 DIAGNOSIS — C50212 Malignant neoplasm of upper-inner quadrant of left female breast: Secondary | ICD-10-CM | POA: Diagnosis not present

## 2021-03-04 DIAGNOSIS — Z170 Estrogen receptor positive status [ER+]: Secondary | ICD-10-CM | POA: Diagnosis not present

## 2021-03-04 DIAGNOSIS — Z510 Encounter for antineoplastic radiation therapy: Secondary | ICD-10-CM | POA: Diagnosis not present

## 2021-03-05 ENCOUNTER — Ambulatory Visit: Payer: Medicare Other

## 2021-03-06 ENCOUNTER — Ambulatory Visit
Admission: RE | Admit: 2021-03-06 | Discharge: 2021-03-06 | Disposition: A | Discharge status: Home / Self Care | Payer: Medicare Other | Source: Ambulatory Visit | Attending: Radiation Oncology | Admitting: Radiation Oncology

## 2021-03-06 ENCOUNTER — Other Ambulatory Visit: Payer: Self-pay

## 2021-03-06 DIAGNOSIS — Z170 Estrogen receptor positive status [ER+]: Secondary | ICD-10-CM | POA: Diagnosis not present

## 2021-03-06 DIAGNOSIS — Z510 Encounter for antineoplastic radiation therapy: Secondary | ICD-10-CM | POA: Diagnosis not present

## 2021-03-06 DIAGNOSIS — C50212 Malignant neoplasm of upper-inner quadrant of left female breast: Secondary | ICD-10-CM | POA: Diagnosis not present

## 2021-03-07 ENCOUNTER — Ambulatory Visit
Admission: RE | Admit: 2021-03-07 | Discharge: 2021-03-07 | Disposition: A | Discharge status: Home / Self Care | Payer: Medicare Other | Source: Ambulatory Visit | Attending: Radiation Oncology | Admitting: Radiation Oncology

## 2021-03-07 ENCOUNTER — Other Ambulatory Visit: Payer: Self-pay

## 2021-03-07 DIAGNOSIS — Z170 Estrogen receptor positive status [ER+]: Secondary | ICD-10-CM | POA: Diagnosis not present

## 2021-03-07 DIAGNOSIS — Z510 Encounter for antineoplastic radiation therapy: Secondary | ICD-10-CM | POA: Diagnosis not present

## 2021-03-07 DIAGNOSIS — C50212 Malignant neoplasm of upper-inner quadrant of left female breast: Secondary | ICD-10-CM | POA: Insufficient documentation

## 2021-03-10 ENCOUNTER — Encounter: Payer: Self-pay | Admitting: *Deleted

## 2021-03-10 ENCOUNTER — Ambulatory Visit
Admission: RE | Admit: 2021-03-10 | Discharge: 2021-03-10 | Disposition: A | Discharge status: Home / Self Care | Payer: Medicare Other | Source: Ambulatory Visit | Attending: Radiation Oncology | Admitting: Radiation Oncology

## 2021-03-10 ENCOUNTER — Other Ambulatory Visit: Payer: Self-pay

## 2021-03-10 DIAGNOSIS — C50212 Malignant neoplasm of upper-inner quadrant of left female breast: Secondary | ICD-10-CM | POA: Diagnosis not present

## 2021-03-10 DIAGNOSIS — Z170 Estrogen receptor positive status [ER+]: Secondary | ICD-10-CM | POA: Diagnosis not present

## 2021-03-10 DIAGNOSIS — Z510 Encounter for antineoplastic radiation therapy: Secondary | ICD-10-CM | POA: Diagnosis not present

## 2021-03-11 ENCOUNTER — Ambulatory Visit
Admission: RE | Admit: 2021-03-11 | Discharge: 2021-03-11 | Disposition: A | Discharge status: Home / Self Care | Payer: Medicare Other | Source: Ambulatory Visit | Attending: Radiation Oncology | Admitting: Radiation Oncology

## 2021-03-11 ENCOUNTER — Ambulatory Visit: Payer: Medicare Other

## 2021-03-11 DIAGNOSIS — Z170 Estrogen receptor positive status [ER+]: Secondary | ICD-10-CM | POA: Diagnosis not present

## 2021-03-11 DIAGNOSIS — Z510 Encounter for antineoplastic radiation therapy: Secondary | ICD-10-CM | POA: Diagnosis not present

## 2021-03-11 DIAGNOSIS — C50212 Malignant neoplasm of upper-inner quadrant of left female breast: Secondary | ICD-10-CM | POA: Diagnosis not present

## 2021-03-12 ENCOUNTER — Encounter: Payer: Self-pay | Admitting: Radiation Oncology

## 2021-03-12 ENCOUNTER — Ambulatory Visit
Admission: RE | Admit: 2021-03-12 | Discharge: 2021-03-12 | Disposition: A | Discharge status: Home / Self Care | Payer: Medicare Other | Source: Ambulatory Visit | Attending: Radiation Oncology | Admitting: Radiation Oncology

## 2021-03-12 DIAGNOSIS — C50212 Malignant neoplasm of upper-inner quadrant of left female breast: Secondary | ICD-10-CM | POA: Diagnosis not present

## 2021-03-12 DIAGNOSIS — Z170 Estrogen receptor positive status [ER+]: Secondary | ICD-10-CM | POA: Diagnosis not present

## 2021-03-12 DIAGNOSIS — Z510 Encounter for antineoplastic radiation therapy: Secondary | ICD-10-CM | POA: Diagnosis not present

## 2021-03-20 ENCOUNTER — Other Ambulatory Visit: Payer: Medicare Other

## 2021-03-29 ENCOUNTER — Other Ambulatory Visit: Payer: Medicare Other

## 2021-04-01 ENCOUNTER — Other Ambulatory Visit: Payer: Self-pay

## 2021-04-01 ENCOUNTER — Ambulatory Visit
Admission: RE | Admit: 2021-04-01 | Discharge: 2021-04-01 | Disposition: A | Discharge status: Home / Self Care | Payer: Medicare Other | Source: Ambulatory Visit | Attending: Hematology and Oncology | Admitting: Hematology and Oncology

## 2021-04-01 DIAGNOSIS — Z780 Asymptomatic menopausal state: Secondary | ICD-10-CM

## 2021-04-01 DIAGNOSIS — M85852 Other specified disorders of bone density and structure, left thigh: Secondary | ICD-10-CM | POA: Diagnosis not present

## 2021-04-02 ENCOUNTER — Telehealth: Payer: Self-pay | Admitting: *Deleted

## 2021-04-02 NOTE — Telephone Encounter (Signed)
Per Wilber Bihari, NP pt recent bone density showing mild osteopenia.  Requesting pt be educated on taking 1,200 mg p.o calcium, 1,00 mg p.o vitamin D and preform weight bearing exercises.  Pt educated and verbalized understanding and appreciative of advice.

## 2021-04-10 ENCOUNTER — Telehealth: Payer: Self-pay

## 2021-04-10 NOTE — Telephone Encounter (Signed)
I called the patient today about her upcoming follow-up appointment in radiation oncology.   Given the state of the COVID-19 pandemic, concerning case numbers in our community, and guidance from [facility], I offered a phone assessment with the patient to determine if coming to the clinic was necessary. She accepted.  I let the patient know that I had spoken with Dr. Isidore Moos, and she wanted them to know the importance of washing their hands for at least 20 seconds at a time, especially after going out in public, and before they eat.  Limit going out in public whenever possible. Do not touch your face, unless your hands are clean, such as when bathing. Get plenty of rest, eat well, and stay hydrated. Patient verbalized understanding and agreement  The patient denies any symptomatic concerns. She reports her fatigue has greatly improved, and states she's sleeping well and has a stable appetite. She denies any residual swelling or tenderness to her left breast, and states her range of motion has returned to normal to her left arm/shoulder. Reports she saw her surgeon Dr. Georgette Dover last week and he was pleased with her progress and recovery. She states she never had any blistering or peeling, and specifically reports good healing of her skin in the radiation fields. I recommended that she continue skin care by applying oil or lotion with vitamin E to the skin in the radiation fields, BID, for 2 more months.  Patient verbalized understanding and agreement.  Continue follow-up with medical oncology - follow-up is scheduled on 05/08/2021 with Annabelle Harman in the [facility] clinic.  I explained that yearly mammograms are important for patients with intact breast tissue, and physical exams are important after mastectomy for patients that cannot undergo mammography.  I encouraged her to call if she had further questions or concerns about her healing. Otherwise, she will follow-up PRN in  radiation oncology. Patient is pleased with this plan and expressed appreciation/gratitude for the care she received from Dr. Isidore Moos and her team. We will cancel her upcoming follow-up to reduce the risk of COVID-19 transmission.

## 2021-04-11 ENCOUNTER — Ambulatory Visit: Payer: Medicare Other | Admitting: Radiation Oncology

## 2021-04-25 ENCOUNTER — Ambulatory Visit (INDEPENDENT_AMBULATORY_CARE_PROVIDER_SITE_OTHER): Payer: Medicare Other

## 2021-04-25 ENCOUNTER — Telehealth: Payer: Self-pay | Admitting: *Deleted

## 2021-04-25 VITALS — Ht 66.0 in | Wt 270.0 lb

## 2021-04-25 DIAGNOSIS — Z0000 Encounter for general adult medical examination without abnormal findings: Secondary | ICD-10-CM

## 2021-04-25 DIAGNOSIS — K6289 Other specified diseases of anus and rectum: Secondary | ICD-10-CM | POA: Diagnosis not present

## 2021-04-25 DIAGNOSIS — K219 Gastro-esophageal reflux disease without esophagitis: Secondary | ICD-10-CM | POA: Diagnosis not present

## 2021-04-25 NOTE — Progress Notes (Signed)
Subjective:   [name] Collier is a 75 y.o. female who presents for Medicare Annual (Subsequent) preventive examination.  Virtual Visit via Telephone Note  I connected with  [name] Collier on 04/25/21 at  1:15 PM EDT by telephone and verified that I am speaking with the correct person using two identifiers.  Location: Patient: Home Provider: [facility] Persons participating in the virtual visit: patient/Nurse Health Advisor   I discussed the limitations, risks, security and privacy concerns of performing an evaluation and management service by telephone and the availability of in person appointments. The patient expressed understanding and agreed to proceed.  Interactive audio and video telecommunications were attempted between this nurse and patient, however failed, due to patient having technical difficulties OR patient did not have access to video capability.  We continued and completed visit with audio only.  Some vital signs may be absent or patient reported.   Kindell Strada E Brevin Mcfadden, LPN   Review of Systems     Cardiac Risk Factors include: obesity (BMI >30kg/m2);sedentary lifestyle;advanced age (>38men, >44 women);dyslipidemia;hypertension     Objective:    Today's Vitals   04/25/21 1243  Weight: 270 lb (122.5 kg)  Height: 5\' 6"  (1.676 m)   Body mass index is 43.58 kg/m.  Advanced Directives 04/25/2021 02/11/2021 01/07/2021 01/01/2021 12/20/2020 04/24/2020 04/24/2019  Does Patient Have a Medical Advance Directive? Yes No Yes Yes Yes Yes Yes  Type of Paramedic of [place];Living will - [place];Living will [place];Living will [place];Living will [place];Living will Living will;Healthcare Power of Attorney  Does patient want to make changes to medical advance directive? - - No - Patient declined No - Patient declined No - Patient declined No - Patient declined No - Patient declined  Copy  of [place] in Chart? No - copy requested - No - copy requested - No - copy requested - No - copy requested  Would patient like information on creating a medical advance directive? - No - Patient declined - - - - -    Current Medications (verified) Outpatient Encounter Medications as of 04/25/2021  Medication Sig  . amLODipine (NORVASC) 5 MG tablet Take 1 tablet (5 mg total) by mouth daily.  Marland Kitchen anastrozole (ARIMIDEX) 1 MG tablet Take 1 tablet (1 mg total) by mouth daily.  . Cholecalciferol (VITAMIN D3) 5000 units CAPS Take 5,000 Units by mouth at bedtime.  . dofetilide (TIKOSYN) 500 MCG capsule TAKE 1 CAPSULE (500 MCG TOTAL) BY MOUTH 2 (TWO) TIMES DAILY.  . DULoxetine (CYMBALTA) 30 MG capsule TAKE 1 CAPSULE BY MOUTH  DAILY  . ezetimibe (ZETIA) 10 MG tablet TAKE 1 TABLET BY MOUTH  DAILY  . famotidine (PEPCID) 20 MG tablet TAKE 1 TABLET BY MOUTH  TWICE DAILY  . furosemide (LASIX) 20 MG tablet TAKE 1 TABLET BY MOUTH  DAILY  . olmesartan (BENICAR) 5 MG tablet TAKE 1 TABLET BY MOUTH  DAILY  . potassium chloride SA (KLOR-CON) 20 MEQ tablet TAKE 1 TABLET BY MOUTH  TWICE DAILY  . rivaroxaban (XARELTO) 20 MG TABS tablet TAKE 1 TABLET (20 MG TOTAL) BY MOUTH DAILY WITH SUPPER.  Marland Kitchen acetaminophen (TYLENOL) 500 MG tablet Take 500 mg by mouth every 6 (six) hours as needed for moderate pain or headache.  (Patient not taking: Reported on 04/25/2021)  . loratadine (CLARITIN) 10 MG tablet Take 1 tablet (10 mg total) by mouth daily. (Patient not taking: Reported on 04/25/2021)  No facility-administered encounter medications on file as of 04/25/2021.    Allergies (verified) Augmentin [amoxicillin-pot clavulanate], Codeine, and Prednisone   History: Past Medical History:  Diagnosis Date  . Anxiety   . Atrial fibrillation ([place])   . Cancer ([place]) 11/2020   left breast IMC  . GERD (gastroesophageal reflux disease)   . HTN (hypertension)   . Hyperlipidemia   . TIA (transient ischemic attack)     Past Surgical History:  Procedure Laterality Date  . BOTOX INJECTION N/A 12/22/2018   Procedure: BOTOX INJECTION INTO ANAL SPHINCTER;  Surgeon: Ileana Roup, MD;  Location: [facility];  Service: General;  Laterality: N/A;  . BREAST BIOPSY Left 11/26/2020  . BREAST LUMPECTOMY WITH RADIOACTIVE SEED AND SENTINEL LYMPH NODE BIOPSY Left 01/07/2021   Procedure: LEFT BREAST LUMPECTOMY WITH RADIOACTIVE SEED AND LEFT AXILLARY SENTINEL LYMPH NODE BIOPSY WITH BLUE DYE INJECTION;  Surgeon: Donnie Mesa, MD;  Location: [facility];  Service: General;  Laterality: Left;  LMA WITH PECTORAL BLOCK, BLUE DYE INJECTION  . CARDIOVERSION N/A 01/04/2018   Procedure: CARDIOVERSION;  Surgeon: Dorothy Spark, MD;  [facility];  Service: Cardiovascular;  Laterality: N/A;  . CARDIOVERSION N/A 09/20/2018   Procedure: CARDIOVERSION;  Surgeon: Sueanne Margarita, MD;  Location: [facility] ENDOSCOPY;  Service: Cardiovascular;  Laterality: N/A;  . CARDIOVERSION N/A 10/27/2018   Procedure: CARDIOVERSION;  Surgeon: Buford Dresser, MD;  Location: [facility]OSCOPY;  Service: Cardiovascular;  Laterality: N/A;  . KNEE SURGERY Bilateral    Torn miniscus  . RECTAL EXAM UNDER ANESTHESIA N/A 12/22/2018   Procedure: ANORECTAL  EXAM UNDER ANESTHESIA;  Surgeon: Ileana Roup, MD;  Location: [facility];  Service: General;  Laterality: N/A;  . [place]    . TONSILLECTOMY    . VAGINAL HYSTERECTOMY     Family History  Problem Relation Age of Onset  . Hypertension Mother   . Hyperlipidemia Mother   . Neurologic Disorder Mother 40       GB  . Stroke Son   . Stroke Maternal Uncle   . Stroke Grandchild   . Prostate cancer Brother    Social History   Socioeconomic History  . Marital status: Widowed    Spouse name: Not on file  . Number of children: 2  . Years of education: 64  . Highest education level: Not on file  Occupational History  . Occupation: Retired    Comment: Personnel officer   Tobacco Use  . Smoking status: Never Smoker  . Smokeless tobacco: Never Used  Vaping Use  . Vaping Use: Never used  Substance and Sexual Activity  . Alcohol use: No    Alcohol/week: 0.0 standard drinks  . Drug use: No  . Sexual activity: Not Currently    Birth control/protection: Post-menopausal  Other Topics Concern  . Not on file  Social History Narrative   Lives alone - children in [place] and [place] -    Caffeine use: Soda/tea daily   Good church support group   Social Determinants of Health   Financial Resource Strain: Low Risk   . Difficulty of Paying Living Expenses: Not hard at all  Food Insecurity: No Food Insecurity  . Worried About Charity fundraiser in the Last Year: Never true  . Ran Out of Food in the Last Year: Never true  Transportation Needs: No Transportation Needs  . Lack of Transportation (Medical): No  . Lack of Transportation (Non-Medical): No  Physical Activity: Insufficiently Active  . Days  of Exercise per Week: 7 days  . Minutes of Exercise per Session: 10 min  Stress: Stress Concern Present  . Feeling of Stress : To some extent  Social Connections: Moderately Integrated  . Frequency of Communication with Friends and Family: More than three times a week  . Frequency of Social Gatherings with Friends and Family: More than three times a week  . Attends Religious Services: More than 4 times per year  . Active Member of Clubs or Organizations: Yes  . Attends Archivist Meetings: More than 4 times per year  . Marital Status: Widowed    Tobacco Counseling Counseling given: Not Answered   Clinical Intake:  Pre-visit preparation completed: Yes  Pain : No/denies pain     BMI - recorded: 43.58 Nutritional Status: BMI > 30  Obese Nutritional Risks: None Diabetes: No  How often do you need to have someone help you when you read instructions, pamphlets, or other written materials from your doctor or pharmacy?: 1 -  Never  Diabetic? no  Interpreter Needed?: No  Information entered by :: Rannie Craney, LPN   Activities of Daily Living In your present state of health, do you have any difficulty performing the following activities: 04/25/2021 01/07/2021  Hearing? N N  Vision? N N  Difficulty concentrating or making decisions? N N  Walking or climbing stairs? N N  Dressing or bathing? N N  Doing errands, shopping? N -  Preparing Food and eating ? N -  Using the Toilet? N -  In the past six months, have you accidently leaked urine? N -  Do you have problems with loss of bowel control? N -  Managing your Medications? N -  Managing your Finances? N -  Housekeeping or managing your Housekeeping? N -  Some recent data might be hidden    Patient Care Team: [name], DO as PCP - General (Family Medicine) Minus Breeding, MD as PCP - Cardiology (Cardiology) Thompson Grayer, MD as PCP - Electrophysiology (Cardiology) [name], MD as Consulting Physician (Hematology and Oncology) Ronald Lobo, MD as Consulting Physician (Gastroenterology) Donnie Mesa, MD as Consulting Physician (General Surgery) [facility]  Indicate any recent Medical Services you may have received from other than [facility] providers in the past year (date may be approximate).     Assessment:   This is a routine wellness examination for [name] Collier.  Hearing/Vision screen  Hearing Screening   125Hz  250Hz  500Hz  1000Hz  2000Hz  3000Hz  4000Hz  6000Hz  8000Hz   Right ear:           Left ear:           Comments: Denies hearing difficulties  Vision Screening Comments: Wears reading glasses only - Routine visits with [facility] - behind with eye exams.  Dietary issues and exercise activities discussed: Current Exercise Habits: The patient does not participate in regular exercise at present, Exercise limited by: orthopedic condition(s)  Goals Addressed            This Visit's Progress   . Exercise 150 min/wk  Moderate Activity   Not on track   . Obtain Annual Eye (retinal)  Exam    Not on track     Depression Screen PHQ 2/9 Scores 04/25/2021 11/27/2020 05/29/2020 05/29/2020 04/24/2020 02/19/2020 07/21/2019  PHQ - 2 Score 0 0 1 0 0 2 2  PHQ- 9 Score - - 8 - - 11 8    Fall Risk Fall Risk  04/25/2021 11/27/2020 05/29/2020 04/24/2020 04/24/2019  Falls in the past year? 0 0 1 1 0  Number falls in past yr: 0 - 0 0 -  Injury with Fall? 0 - 0 0 -  Risk for fall due to : Orthopedic patient - Impaired balance/gait Impaired balance/gait -  Follow up Falls prevention discussed - Falls evaluation completed Falls evaluation completed -    FALL RISK PREVENTION PERTAINING TO THE HOME:  Any stairs in or around the home? Yes  If so, are there any without handrails? No  Home free of loose throw rugs in walkways, pet beds, electrical cords, etc? Yes  Adequate lighting in your home to reduce risk of falls? Yes   ASSISTIVE DEVICES UTILIZED TO PREVENT FALLS:  Life alert? No  Use of a cane, walker or w/c? No  Grab bars in the bathroom? Yes  Shower chair or bench in shower? Yes  Elevated toilet seat or a handicapped toilet? Yes   TIMED UP AND GO:  Was the test performed? No . Telephonic visit.  Cognitive Function: Normal cognitive status assessed by direct observation by this Nurse Health Advisor. No abnormalities found.      6CIT Screen 04/24/2020 04/24/2019  What Year? 0 points 0 points  What month? 0 points 0 points  What time? 0 points 0 points  Count back from 20 2 points 0 points  Months in reverse 0 points 0 points  Repeat phrase 0 points 0 points  Total Score 2 0    Immunizations Immunization History  Administered Date(s) Administered  . Moderna Sars-Covid-2 Vaccination 02/29/2020, 03/28/2020, 12/20/2020  . Pneumococcal Conjugate-13 12/30/2016  . Pneumococcal Polysaccharide-23 02/08/2018    TDAP status: Due, Education has been provided regarding the importance of this vaccine. Advised may  receive this vaccine at local pharmacy or Health Dept. Aware to provide a copy of the vaccination record if obtained from local pharmacy or Health Dept. Verbalized acceptance and understanding.  Flu Vaccine status: Declined, Education has been provided regarding the importance of this vaccine but patient still declined. Advised may receive this vaccine at local pharmacy or Health Dept. Aware to provide a copy of the vaccination record if obtained from local pharmacy or Health Dept. Verbalized acceptance and understanding.  Pneumococcal vaccine status: Up to date  Covid-19 vaccine status: Completed vaccines  Qualifies for Shingles Vaccine? Yes   Zostavax completed No   Shingrix Completed?: No.    Education has been provided regarding the importance of this vaccine. Patient has been advised to call insurance company to determine out of pocket expense if they have not yet received this vaccine. Advised may also receive vaccine at local pharmacy or Health Dept. Verbalized acceptance and understanding.  Screening Tests Health Maintenance  Topic Date Due  . TETANUS/TDAP  Never done  . COLONOSCOPY (Pts 45-1yrs Insurance coverage will need to be confirmed)  04/15/2020  . COVID-19 Vaccine (4 - Booster for Moderna series) 03/20/2021  . INFLUENZA VACCINE  07/07/2021  . MAMMOGRAM  11/04/2021  . DEXA SCAN  04/02/2023  . Hepatitis C Screening  Completed  . PNA vac Low Risk Adult  Completed  . HPV VACCINES  Aged Out    Health Maintenance  Health Maintenance Due  Topic Date Due  . TETANUS/TDAP  Never done  . COLONOSCOPY (Pts 45-53yrs Insurance coverage will need to be confirmed)  04/15/2020  . COVID-19 Vaccine (4 - Booster for Moderna series) 03/20/2021    Colorectal cancer screening: Referral to GI placed 04/2021. Pt aware the office will call re:  appt. Has appt with Dr Cristina Gong for colonoscopy 06/26/2021  Mammogram status: Completed 1221/2021. Repeat every year  Bone Density status: Completed  04/01/2021. Results reflect: Bone density results: OSTEOPENIA. Repeat every 2 years.  Lung Cancer Screening: (Low Dose CT Chest recommended if Age 81-80 years, 30 pack-year currently smoking OR have quit w/in 15years.) does not qualify.   Additional Screening:  Hepatitis C Screening: does qualify; Completed 02/19/2020  Vision Screening: Recommended annual ophthalmology exams for early detection of glaucoma and other disorders of the eye. Is the patient up to date with their annual eye exam?  No  Who is the provider or what is the name of the office in which the patient attends annual eye exams? [facility] If pt is not established with a provider, would they like to be referred to a provider to establish care? No .   Dental Screening: Recommended annual dental exams for proper oral hygiene  Community Resource Referral / Chronic Care Management: CRR required this visit?  No   CCM required this visit?  No      Plan:     I have personally reviewed and noted the following in the patient's chart:   . Medical and social history . Use of alcohol, tobacco or illicit drugs  . Current medications and supplements including opioid prescriptions.  . Functional ability and status . Nutritional status . Physical activity . Advanced directives . List of other physicians . Hospitalizations, surgeries, and ER visits in previous 12 months . Vitals . Screenings to include cognitive, depression, and falls . Referrals and appointments  In addition, I have reviewed and discussed with patient certain preventive protocols, quality metrics, and best practice recommendations. A written personalized care plan for preventive services as well as general preventive health recommendations were provided to patient.     Sandrea Hammond, LPN   06/07/6377   Nurse Notes: None

## 2021-04-25 NOTE — Telephone Encounter (Signed)
   [facility] Pre-operative Risk Assessment    Patient Name: [name] Collier  DOB: 1946/08/03  MRN: [id number]   HEARTCARE STAFF: - Please ensure there is not already an duplicate clearance open for this procedure. - Under Visit Info/Reason for Call, type in Other and utilize the format Clearance MM/DD/YY or Clearance TBD. Do not use dashes or single digits. - If request is for dental extraction, please clarify the # of teeth to be extracted.  Request for surgical clearance:  1. What type of surgery is being performed? COLONOSCOPY   2. When is this surgery scheduled? 06/26/21   3. What type of clearance is required (medical clearance vs. Pharmacy clearance to hold med vs. Both)? BOTH  4. Are there any medications that need to be held prior to surgery and how long? XARELTO x 2 DAYS PRIOR TO PROCEDURE  5. Practice name and name of physician performing surgery? [facility]; DR. Cristina Collier   6. What is the office phone number? [phone]   7.   What is the office fax number? [phone]  8.   Anesthesia type (None, local, MAC, general) ? NOT LISTED (PROPOFOL?)   [name] Collier 04/25/2021, 2:31 PM  _________________________________________________________________   (provider comments below)

## 2021-04-25 NOTE — Patient Instructions (Signed)
[name] Collier , Thank you for taking time to come for your Medicare Wellness Visit. I appreciate your ongoing commitment to your health goals. Please review the following plan we discussed and let me know if I can assist you in the future.   Screening recommendations/referrals: Colonoscopy: Done 04/15/2017 - Repeat every 3 years *has appointment 06/26/21 Mammogram: Done 11/26/2020 - Repeat every year or more often Bone Density: Done 04/01/2021 - Repeat every 2 years Recommended yearly ophthalmology/optometry visit for glaucoma screening and checkup Recommended yearly dental visit for hygiene and checkup  Vaccinations: Influenza vaccine: Declines Pneumococcal vaccine: Done 12/30/2016 & 02/08/2018 Tdap vaccine: DUE Shingles vaccine: DUE Shingrix discussed. Please contact your pharmacy for coverage information.    Covid-19: Done 02/29/20, 03/28/20, & 12/20/20  Advanced directives: Please bring a copy of your health care power of attorney and living will to the office to be added to your chart at your convenience.  Conditions/risks identified: Aim for 30 minutes of exercise or brisk walking each day, drink 6-8 glasses of water and eat lots of fruits and vegetables.  Next appointment: Follow up in one year for your annual wellness visit    Preventive Care 65 Years and Older, Female Preventive care refers to lifestyle choices and visits with your health care provider that can promote health and wellness. What does preventive care include?  A yearly physical exam. This is also called an annual well check.  Dental exams once or twice a year.  Routine eye exams. Ask your health care provider how often you should have your eyes checked.  Personal lifestyle choices, including:  Daily care of your teeth and gums.  Regular physical activity.  Eating a healthy diet.  Avoiding tobacco and drug use.  Limiting alcohol use.  Practicing safe sex.  Taking low-dose aspirin every day.  Taking vitamin  and mineral supplements as recommended by your health care provider. What happens during an annual well check? The services and screenings done by your health care provider during your annual well check will depend on your age, overall health, lifestyle risk factors, and family history of disease. Counseling  Your health care provider may ask you questions about your:  Alcohol use.  Tobacco use.  Drug use.  Emotional well-being.  Home and relationship well-being.  Sexual activity.  Eating habits.  History of falls.  Memory and ability to understand (cognition).  Work and work Statistician.  Reproductive health. Screening  You may have the following tests or measurements:  Height, weight, and BMI.  Blood pressure.  Lipid and cholesterol levels. These may be checked every 5 years, or more frequently if you are over 17 years old.  Skin check.  Lung cancer screening. You may have this screening every year starting at age 85 if you have a 30-pack-year history of smoking and currently smoke or have quit within the past 15 years.  Fecal occult blood test (FOBT) of the stool. You may have this test every year starting at age 60.  Flexible sigmoidoscopy or colonoscopy. You may have a sigmoidoscopy every 5 years or a colonoscopy every 10 years starting at age 2.  Hepatitis C blood test.  Hepatitis B blood test.  Sexually transmitted disease (STD) testing.  Diabetes screening. This is done by checking your blood sugar (glucose) after you have not eaten for a while (fasting). You may have this done every 1-3 years.  Bone density scan. This is done to screen for osteoporosis. You may have this done starting at age  65.  Mammogram. This may be done every 1-2 years. Talk to your health care provider about how often you should have regular mammograms. Talk with your health care provider about your test results, treatment options, and if necessary, the need for more  tests. Vaccines  Your health care provider may recommend certain vaccines, such as:  Influenza vaccine. This is recommended every year.  Tetanus, diphtheria, and acellular pertussis (Tdap, Td) vaccine. You may need a Td booster every 10 years.  Zoster vaccine. You may need this after age 24.  Pneumococcal 13-valent conjugate (PCV13) vaccine. One dose is recommended after age 1.  Pneumococcal polysaccharide (PPSV23) vaccine. One dose is recommended after age 10. Talk to your health care provider about which screenings and vaccines you need and how often you need them. This information is not intended to replace advice given to you by your health care provider. Make sure you discuss any questions you have with your health care provider. Document Released: 12/20/2015 Document Revised: 08/12/2016 Document Reviewed: 09/24/2015 Elsevier Interactive Patient Education  2017 [place] Prevention in the Home Falls can cause injuries. They can happen to people of all ages. There are many things you can do to make your home safe and to help prevent falls. What can I do on the outside of my home?  Regularly fix the edges of walkways and driveways and fix any cracks.  Remove anything that might make you trip as you walk through a door, such as a raised step or threshold.  Trim any bushes or trees on the path to your home.  Use bright outdoor lighting.  Clear any walking paths of anything that might make someone trip, such as rocks or tools.  Regularly check to see if handrails are loose or broken. Make sure that both sides of any steps have handrails.  Any raised decks and porches should have guardrails on the edges.  Have any leaves, snow, or ice cleared regularly.  Use sand or salt on walking paths during winter.  Clean up any spills in your garage right away. This includes oil or grease spills. What can I do in the bathroom?  Use night lights.  Install grab bars by the  toilet and in the tub and shower. Do not use towel bars as grab bars.  Use non-skid mats or decals in the tub or shower.  If you need to sit down in the shower, use a plastic, non-slip stool.  Keep the floor dry. Clean up any water that spills on the floor as soon as it happens.  Remove soap buildup in the tub or shower regularly.  Attach bath mats securely with double-sided non-slip rug tape.  Do not have throw rugs and other things on the floor that can make you trip. What can I do in the bedroom?  Use night lights.  Make sure that you have a light by your bed that is easy to reach.  Do not use any sheets or blankets that are too big for your bed. They should not hang down onto the floor.  Have a firm chair that has side arms. You can use this for support while you get dressed.  Do not have throw rugs and other things on the floor that can make you trip. What can I do in the kitchen?  Clean up any spills right away.  Avoid walking on wet floors.  Keep items that you use a lot in easy-to-reach places.  If you need  to reach something above you, use a strong step stool that has a grab bar.  Keep electrical cords out of the way.  Do not use floor polish or wax that makes floors slippery. If you must use wax, use non-skid floor wax.  Do not have throw rugs and other things on the floor that can make you trip. What can I do with my stairs?  Do not leave any items on the stairs.  Make sure that there are handrails on both sides of the stairs and use them. Fix handrails that are broken or loose. Make sure that handrails are as long as the stairways.  Check any carpeting to make sure that it is firmly attached to the stairs. Fix any carpet that is loose or worn.  Avoid having throw rugs at the top or bottom of the stairs. If you do have throw rugs, attach them to the floor with carpet tape.  Make sure that you have a light switch at the top of the stairs and the bottom of  the stairs. If you do not have them, ask someone to add them for you. What else can I do to help prevent falls?  Wear shoes that:  Do not have high heels.  Have rubber bottoms.  Are comfortable and fit you well.  Are closed at the toe. Do not wear sandals.  If you use a stepladder:  Make sure that it is fully opened. Do not climb a closed stepladder.  Make sure that both sides of the stepladder are locked into place.  Ask someone to hold it for you, if possible.  Clearly mark and make sure that you can see:  Any grab bars or handrails.  First and last steps.  Where the edge of each step is.  Use tools that help you move around (mobility aids) if they are needed. These include:  Canes.  Walkers.  Scooters.  Crutches.  Turn on the lights when you go into a dark area. Replace any light bulbs as soon as they burn out.  Set up your furniture so you have a clear path. Avoid moving your furniture around.  If any of your floors are uneven, fix them.  If there are any pets around you, be aware of where they are.  Review your medicines with your doctor. Some medicines can make you feel dizzy. This can increase your chance of falling. Ask your doctor what other things that you can do to help prevent falls. This information is not intended to replace advice given to you by your health care provider. Make sure you discuss any questions you have with your health care provider. Document Released: 09/19/2009 Document Revised: 04/30/2016 Document Reviewed: 12/28/2014 Elsevier Interactive Patient Education  2017 [employer].

## 2021-04-28 NOTE — Telephone Encounter (Signed)
Notes faxed to surgeon. This phone note will be removed from the preop pool. Richardson Dopp, PA-C  04/28/2021 11:36 AM

## 2021-04-28 NOTE — Telephone Encounter (Signed)
   Primary Cardiologist: Minus Breeding, MD EP: Thompson Grayer, MD   Chart reviewed as part of pre-operative protocol coverage.   75 y.o. female with . Atrial fibrillation  o Dofetilide Rx . Echocardiogram 12/18: EF 60-65, mild to mod TR, mod PI, PASP 69 . Hypertension  . Hyperlipidemia  . Hx of TIA  . RBBB  Last OV:  12/19/20 with Roderic Palau, NP Procedure:  Colonoscopy Rx:  Hold Xarelto 2 days  RCRI:  Perioperative Risk of Major Cardiac Event is (%): 0.9 (low risk) DASI:  Functional Capacity in METs is: 4.31 (functional status is fair )  CHA2DS2-VASc Score = 5 [CHF History: No, HTN History: Yes, Diabetes History: No, Stroke History: Yes, Vascular Disease History: No, Age Score: 1, Gender Score: 1].  Therefore, the patient's annual risk of stroke is 7.2 %.      Patient was contacted 04/28/2021 in reference to pre-operative risk assessment for pending surgery as outlined below.    Since last seen, [name] Collier has done well without chest pain, shortness of breath.  Recommendations: . Based on ACC/AHA guidelines, the patient is at acceptable risk for the planned procedure and may proceed without further cardiovascular testing.  . Per our clinical pharmacist: "Would prefer 1 day Xarelto hold given pt's elevated CV risk. If 2 day hold is necessary, Dr Rayann Heman did clear pt in January 2022 to hold 2 days prior to a biopsy and to resume as soon as able post op."   Please call with questions. Richardson Dopp, PA-C 04/28/2021, 11:33 AM

## 2021-04-28 NOTE — Telephone Encounter (Signed)
Patient with diagnosis of afib on Xarelto for anticoagulation.    Procedure: colonoscopy Date of procedure: 06/26/21  CHA2DS2-VASc Score = 5  This indicates a 7.2% annual risk of stroke. The patient's score is based upon: CHF History: No HTN History: Yes Diabetes History: No Stroke History: Yes Vascular Disease History: No Age Score: 1 Gender Score: 1  CrCl 79mL/min using adjusted body weight Platelet count 269K  Would prefer 1 day Xarelto hold given pt's elevated CV risk. If 2 day hold is necessary, Dr Rayann Heman did clear pt in January 2022 to hold 2 days prior to a biopsy and to resume as soon as able post op.

## 2021-04-29 ENCOUNTER — Other Ambulatory Visit: Payer: Self-pay | Admitting: Family Medicine

## 2021-04-30 ENCOUNTER — Encounter: Payer: Self-pay | Admitting: Family Medicine

## 2021-04-30 ENCOUNTER — Other Ambulatory Visit: Payer: Self-pay

## 2021-04-30 ENCOUNTER — Ambulatory Visit (INDEPENDENT_AMBULATORY_CARE_PROVIDER_SITE_OTHER): Payer: Medicare Other | Admitting: Family Medicine

## 2021-04-30 VITALS — BP 137/77 | HR 75 | Temp 97.3°F | Ht 66.0 in | Wt 283.0 lb

## 2021-04-30 DIAGNOSIS — R310 Gross hematuria: Secondary | ICD-10-CM

## 2021-04-30 DIAGNOSIS — R319 Hematuria, unspecified: Secondary | ICD-10-CM | POA: Diagnosis not present

## 2021-04-30 LAB — URINALYSIS, ROUTINE W REFLEX MICROSCOPIC
Bilirubin, UA: NEGATIVE
Glucose, UA: NEGATIVE
Nitrite, UA: POSITIVE — AB
Specific Gravity, UA: 1.02 (ref 1.005–1.030)
Urobilinogen, Ur: 2 mg/dL — ABNORMAL HIGH (ref 0.2–1.0)
pH, UA: 8.5 — ABNORMAL HIGH (ref 5.0–7.5)

## 2021-04-30 LAB — MICROSCOPIC EXAMINATION
Bacteria, UA: NONE SEEN
RBC, Urine: >30 /hpf — AB (ref 0–2)

## 2021-04-30 MED ORDER — CEPHALEXIN 500 MG PO CAPS: 500.0000 mg | ORAL_CAPSULE | Freq: Two times a day (BID) | ORAL | 0 refills | Status: DC

## 2021-04-30 NOTE — Progress Notes (Signed)
  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   Plan: The patient will follow-up with radiation oncology in [place] as needed. -----------------------------------  SEppie Gibson MD

## 2021-04-30 NOTE — Progress Notes (Signed)
Acute Office Visit  Subjective:    Patient ID: [name] Collier, female    DOB: 10-20-1946, 75 y.o.   MRN: [id number]  Chief Complaint  Patient presents with  . Hematuria    HPI Patient is in today for hematuria for 3 days. For a few days before her urine looked darker. Denies dysuria, frequency, flank pain, abdominal pain, vaginal pain, vaginal irritation, vaginal discharge, fever, or chills. She does have urgency that is at baseline for her. She does not have any blood in her underwear or when she wipes, just when she voids. She has had a hysterectomy. She is on xalreto for A.fib. She has a history of breast cancer and started on arimidex a few weeks ago. She denies lightheadedness, dizziness, or weakness.   Past Medical History:  Diagnosis Date  . Anxiety   . Atrial fibrillation ([place])   . Cancer ([place]) 11/2020   left breast IMC  . GERD (gastroesophageal reflux disease)   . HTN (hypertension)   . Hyperlipidemia   . TIA (transient ischemic attack)     Past Surgical History:  Procedure Laterality Date  . BOTOX INJECTION N/A 12/22/2018   Procedure: BOTOX INJECTION INTO ANAL SPHINCTER;  Surgeon: Ileana Roup, MD;  Location: [facility];  Service: General;  Laterality: N/A;  . BREAST BIOPSY Left 11/26/2020  . BREAST LUMPECTOMY WITH RADIOACTIVE SEED AND SENTINEL LYMPH NODE BIOPSY Left 01/07/2021   Procedure: LEFT BREAST LUMPECTOMY WITH RADIOACTIVE SEED AND LEFT AXILLARY SENTINEL LYMPH NODE BIOPSY WITH BLUE DYE INJECTION;  Surgeon: Donnie Mesa, MD;  Location: [place];  Service: General;  Laterality: Left;  LMA WITH PECTORAL BLOCK, BLUE DYE INJECTION  . CARDIOVERSION N/A 01/04/2018   Procedure: CARDIOVERSION;  Surgeon: Dorothy Spark, MD;  Location: [facility] ENDOSCOPY;  Service: Cardiovascular;  Laterality: N/A;  . CARDIOVERSION N/A 09/20/2018   Procedure: CARDIOVERSION;  Surgeon: Sueanne Margarita, MD;  [facility];  Service: Cardiovascular;  Laterality: N/A;   . CARDIOVERSION N/A 10/27/2018   Procedure: CARDIOVERSION;  Surgeon: Buford Dresser, MD;  Location: [facility] ENDOSCOPY;  Service: Cardiovascular;  Laterality: N/A;  . KNEE SURGERY Bilateral    Torn miniscus  . RECTAL EXAM UNDER ANESTHESIA N/A 12/22/2018   Procedure: ANORECTAL  EXAM UNDER ANESTHESIA;  Surgeon: Ileana Roup, MD;  Location: [facility];  Service: General;  Laterality: N/A;  . [place]    . TONSILLECTOMY    . VAGINAL HYSTERECTOMY      Family History  Problem Relation Age of Onset  . Hypertension Mother   . Hyperlipidemia Mother   . Neurologic Disorder Mother 68       GB  . Stroke Son   . Stroke Maternal Uncle   . Stroke Grandchild   . Prostate cancer Brother     Social History   Socioeconomic History  . Marital status: Widowed    Spouse name: Not on file  . Number of children: 2  . Years of education: 68  . Highest education level: Not on file  Occupational History  . Occupation: Retired    Comment: Personnel officer  Tobacco Use  . Smoking status: Never Smoker  . Smokeless tobacco: Never Used  Vaping Use  . Vaping Use: Never used  Substance and Sexual Activity  . Alcohol use: No    Alcohol/week: 0.0 standard drinks  . Drug use: No  . Sexual activity: Not Currently    Birth control/protection: Post-menopausal  Other Topics Concern  . Not on  file  Social History Narrative   Lives alone - children in [place] and [place] -    Caffeine use: Soda/tea daily   Good church support group   Social Determinants of Health   Financial Resource Strain: Low Risk   . Difficulty of Paying Living Expenses: Not hard at all  Food Insecurity: No Food Insecurity  . Worried About Charity fundraiser in the Last Year: Never true  . Ran Out of Food in the Last Year: Never true  Transportation Needs: No Transportation Needs  . Lack of Transportation (Medical): No  . Lack of Transportation (Non-Medical): No  Physical Activity: Insufficiently Active  .  Days of Exercise per Week: 7 days  . Minutes of Exercise per Session: 10 min  Stress: Stress Concern Present  . Feeling of Stress : To some extent  Social Connections: Moderately Integrated  . Frequency of Communication with Friends and Family: More than three times a week  . Frequency of Social Gatherings with Friends and Family: More than three times a week  . Attends Religious Services: More than 4 times per year  . Active Member of Clubs or Organizations: Yes  . Attends Archivist Meetings: More than 4 times per year  . Marital Status: Widowed  Intimate Partner Violence: Not At Risk  . Fear of Current or Ex-Partner: No  . Emotionally Abused: No  . Physically Abused: No  . Sexually Abused: No    Outpatient Medications Prior to Visit  Medication Sig Dispense Refill  . acetaminophen (TYLENOL) 500 MG tablet Take 500 mg by mouth every 6 (six) hours as needed for moderate pain or headache.    Marland Kitchen amLODipine (NORVASC) 5 MG tablet TAKE 1 AND 1/2 TABLETS BY  MOUTH DAILY 135 tablet 0  . anastrozole (ARIMIDEX) 1 MG tablet Take 1 tablet (1 mg total) by mouth daily. 90 tablet 3  . Cholecalciferol (VITAMIN D3) 5000 units CAPS Take 5,000 Units by mouth at bedtime.    . dofetilide (TIKOSYN) 500 MCG capsule TAKE 1 CAPSULE (500 MCG TOTAL) BY MOUTH 2 (TWO) TIMES DAILY. 60 capsule 6  . DULoxetine (CYMBALTA) 30 MG capsule TAKE 1 CAPSULE BY MOUTH  DAILY 90 capsule 3  . ezetimibe (ZETIA) 10 MG tablet TAKE 1 TABLET BY MOUTH  DAILY 90 tablet 1  . famotidine (PEPCID) 20 MG tablet TAKE 1 TABLET BY MOUTH  TWICE DAILY 180 tablet 3  . furosemide (LASIX) 20 MG tablet TAKE 1 TABLET BY MOUTH  DAILY 90 tablet 1  . loratadine (CLARITIN) 10 MG tablet Take 1 tablet (10 mg total) by mouth daily. 30 tablet 11  . olmesartan (BENICAR) 5 MG tablet TAKE 1 TABLET BY MOUTH  DAILY 90 tablet 1  . potassium chloride SA (KLOR-CON) 20 MEQ tablet TAKE 1 TABLET BY MOUTH  TWICE DAILY 180 tablet 3  . rivaroxaban (XARELTO) 20  MG TABS tablet TAKE 1 TABLET (20 MG TOTAL) BY MOUTH DAILY WITH SUPPER. 90 tablet 2   No facility-administered medications prior to visit.    Allergies  Allergen Reactions  . Augmentin [Amoxicillin-Pot Clavulanate] Other (See Comments)    c-diff Has patient had a PCN reaction causing immediate rash, facial/tongue/throat swelling, SOB or lightheadedness with hypotension: No Has patient had a PCN reaction causing severe rash involving mucus membranes or skin necrosis: No Has patient had a PCN reaction that required hospitalization: No Has patient had a PCN reaction occurring within the last 10 years: Yes If all of the above answers  are "NO", then may proceed with Cephalosporin use.   . Codeine Palpitations and Rash  . Prednisone Other (See Comments)    Jittery, red in the face    Review of Systems As per HPI.     Objective:    Physical Exam Vitals and nursing note reviewed.  Constitutional:      Appearance: Normal appearance.  Cardiovascular:     Rate and Rhythm: Normal rate and regular rhythm.  Pulmonary:     Effort: Pulmonary effort is normal. No respiratory distress.     Breath sounds: Normal breath sounds.  Abdominal:     General: Bowel sounds are normal. There is no distension.     Palpations: Abdomen is soft.     Tenderness: There is no abdominal tenderness. There is no right CVA tenderness, left CVA tenderness, guarding or rebound.  Skin:    General: Skin is warm and dry.  Neurological:     General: No focal deficit present.     Mental Status: She is alert and oriented to person, place, and time.  Psychiatric:        Mood and Affect: Mood normal.        Behavior: Behavior normal.     BP 137/77   Pulse 75   Temp (!) 97.3 F (36.3 C) (Temporal)   Ht 5\' 6"  (1.676 m)   Wt 283 lb (128.4 kg)   BMI 45.68 kg/m  Wt Readings from Last 3 Encounters:  04/30/21 283 lb (128.4 kg)  04/25/21 270 lb (122.5 kg)  02/11/21 289 lb 9.6 oz (131.4 kg)   Urine dipstick shows  positive for RBC's, positive for protein, positive for nitrates, positive for leukocytes, positive for urobilinogen and positive for ketones.  Micro exam: 0-5 WBC's per HPF, >30 RBC's per HPF and no bacteria.   Health Maintenance Due  Topic Date Due  . TETANUS/TDAP  Never done  . COLONOSCOPY (Pts 45-20yrs Insurance coverage will need to be confirmed)  04/15/2020  . COVID-19 Vaccine (4 - Booster for Moderna series) 03/20/2021    There are no preventive care reminders to display for this patient.   Lab Results  Component Value Date   TSH 4.210 11/27/2020   Lab Results  Component Value Date   WBC 5.1 11/27/2020   HGB 13.4 11/27/2020   HCT 40.5 11/27/2020   MCV 88 11/27/2020   PLT 269 11/27/2020   Lab Results  Component Value Date   NA 140 01/03/2021   K 4.5 01/03/2021   CO2 25 01/03/2021   GLUCOSE 100 (H) 01/03/2021   BUN 11 01/03/2021   CREATININE 0.73 01/03/2021   BILITOT 1.0 11/27/2020   ALKPHOS 85 11/27/2020   AST 18 11/27/2020   ALT 13 11/27/2020   PROT 6.6 11/27/2020   ALBUMIN 3.8 11/27/2020   CALCIUM 9.2 01/03/2021   ANIONGAP 11 01/03/2021   Lab Results  Component Value Date   CHOL 180 11/27/2020   Lab Results  Component Value Date   HDL 62 11/27/2020   Lab Results  Component Value Date   LDLCALC 101 (H) 11/27/2020   Lab Results  Component Value Date   TRIG 91 11/27/2020   Lab Results  Component Value Date   CHOLHDL 2.9 11/27/2020   Lab Results  Component Value Date   HGBA1C 5.8 11/27/2020       Assessment & Plan:   Reneka was seen today for hematuria.  Diagnoses and all orders for this visit:  Gross hematuria UA dip +  for nitrates and leuks. Will treat for UTI with keflex. Culture pending. Discussed referral to urology if culture is negative and hematuria is unresolved. May need to hold xarelto if hematuria does not resolve in a few days. Will determine follow up plan pending culture results. Return to office for new or worsening  symptoms, or if symptoms persist.  -     Urinalysis, Routine w reflex microscopic -     Urine Culture -     cephALEXin (KEFLEX) 500 MG capsule; Take 1 capsule (500 mg total) by mouth 2 (two) times daily.  The patient indicates understanding of these issues and agrees with the plan.   Gwenlyn Perking, FNP

## 2021-05-01 LAB — URINE CULTURE

## 2021-05-02 ENCOUNTER — Other Ambulatory Visit: Payer: Self-pay | Admitting: Family Medicine

## 2021-05-02 DIAGNOSIS — R310 Gross hematuria: Secondary | ICD-10-CM

## 2021-05-03 ENCOUNTER — Other Ambulatory Visit: Payer: Self-pay | Admitting: Family Medicine

## 2021-05-06 ENCOUNTER — Emergency Department (HOSPITAL_COMMUNITY)
Admission: EM | Admit: 2021-05-06 | Discharge: 2021-05-06 | Disposition: A | Discharge status: Home / Self Care | Payer: Medicare Other | Attending: Emergency Medicine | Admitting: Emergency Medicine

## 2021-05-06 ENCOUNTER — Encounter (HOSPITAL_COMMUNITY): Payer: Self-pay | Admitting: *Deleted

## 2021-05-06 ENCOUNTER — Other Ambulatory Visit: Payer: Self-pay

## 2021-05-06 ENCOUNTER — Emergency Department (HOSPITAL_COMMUNITY): Payer: Medicare Other

## 2021-05-06 DIAGNOSIS — Z853 Personal history of malignant neoplasm of breast: Secondary | ICD-10-CM | POA: Diagnosis not present

## 2021-05-06 DIAGNOSIS — Z79899 Other long term (current) drug therapy: Secondary | ICD-10-CM | POA: Insufficient documentation

## 2021-05-06 DIAGNOSIS — I700 Atherosclerosis of aorta: Secondary | ICD-10-CM | POA: Diagnosis not present

## 2021-05-06 DIAGNOSIS — Z7901 Long term (current) use of anticoagulants: Secondary | ICD-10-CM | POA: Insufficient documentation

## 2021-05-06 DIAGNOSIS — R319 Hematuria, unspecified: Secondary | ICD-10-CM | POA: Diagnosis not present

## 2021-05-06 DIAGNOSIS — I10 Essential (primary) hypertension: Secondary | ICD-10-CM | POA: Diagnosis not present

## 2021-05-06 DIAGNOSIS — K429 Umbilical hernia without obstruction or gangrene: Secondary | ICD-10-CM | POA: Diagnosis not present

## 2021-05-06 LAB — URINALYSIS, ROUTINE W REFLEX MICROSCOPIC
Bacteria, UA: NONE SEEN
RBC / HPF: >50 RBC/hpf — ABNORMAL HIGH (ref 0–5)

## 2021-05-06 NOTE — ED Notes (Signed)
Pt aware UA is needed.

## 2021-05-06 NOTE — ED Notes (Signed)
An After Visit Summary was printed and given to the patient. Discharge instructions given and no further questions at this time.  

## 2021-05-06 NOTE — ED Triage Notes (Addendum)
Pt reports blood in urine x 1 week. She reports pressure in lower abdomen 2 nights ago and right flank pain last night. She went to her PCP last week, reports urine had blood but no infection.

## 2021-05-06 NOTE — Discharge Instructions (Addendum)
Keep your appointment with your urologist tomorrow 

## 2021-05-06 NOTE — ED Provider Notes (Signed)
[facility] DEPT Provider Note   CSN: [id number] Arrival date & time: 05/06/21  1051     History Chief Complaint  Patient presents with  . Hematuria    [name] Collier is a 75 y.o. female.  75 year old female presents with hematuria times a week.  She has not developed right-sided flank pain.  She is on Xarelto for A. fib.  She denies any fever.  She denies dysuria..  No prior history of kidney stones.  No treatment use prior to arrival        Past Medical History:  Diagnosis Date  . Anxiety   . Atrial fibrillation ([place])   . Cancer ([place]) 11/2020   left breast IMC  . GERD (gastroesophageal reflux disease)   . HTN (hypertension)   . Hyperlipidemia   . TIA (transient ischemic attack)     Patient Active Problem List   Diagnosis Date Noted  . Carcinoma of upper-inner quadrant of left breast in female, estrogen receptor positive ([place]) 12/20/2020  . Trigger thumb of left hand 06/28/2019  . Depression, major, single episode, mild ([place]) 03/14/2019  . Generalized anxiety disorder 03/14/2019  . PAF (paroxysmal atrial fibrillation) ([place])   . Osteoarthritis of knee 04/20/2018  . Post-menopausal 02/22/2018  . Snoring 01/12/2018  . Hypokalemia 01/12/2018  . Encounter for cardioversion procedure   . Persistent atrial fibrillation 11/11/2017  . SOB (shortness of breath) 11/11/2017  . Other fatigue 11/11/2017  . Hyperlipemia 04/16/2017  . Weakness of left hand 05/11/2016  . 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 Essential hypertension   . Normochromic normocytic anemia   . TIA (transient ischemic attack) 02/26/2016    Past Surgical History:  Procedure Laterality Date  . BOTOX INJECTION N/A 12/22/2018   Procedure: BOTOX INJECTION INTO ANAL SPHINCTER;  Surgeon: Ileana Roup, MD;  Location: [facility];  Service: General;  Laterality: N/A;  . BREAST BIOPSY Left 11/26/2020  . BREAST LUMPECTOMY WITH RADIOACTIVE SEED AND SENTINEL LYMPH NODE BIOPSY  Left 01/07/2021   Procedure: LEFT BREAST LUMPECTOMY WITH RADIOACTIVE SEED AND LEFT AXILLARY SENTINEL LYMPH NODE BIOPSY WITH BLUE DYE INJECTION;  Surgeon: Donnie Mesa, MD;  Location: [facility];  Service: General;  Laterality: Left;  LMA WITH PECTORAL BLOCK, BLUE DYE INJECTION  . CARDIOVERSION N/A 01/04/2018   Procedure: CARDIOVERSION;  Surgeon: Dorothy Spark, MD;  Location: [facility] ENDOSCOPY;  Service: Cardiovascular;  Laterality: N/A;  . CARDIOVERSION N/A 09/20/2018   Procedure: CARDIOVERSION;  Surgeon: Sueanne Margarita, MD;  Location: [facility] ENDOSCOPY;  Service: Cardiovascular;  Laterality: N/A;  . CARDIOVERSION N/A 10/27/2018   Procedure: CARDIOVERSION;  Surgeon: Buford Dresser, MD;  Location: [facility] ENDOSCOPY;  Service: Cardiovascular;  Laterality: N/A;  . KNEE SURGERY Bilateral    Torn miniscus  . RECTAL EXAM UNDER ANESTHESIA N/A 12/22/2018   Procedure: ANORECTAL  EXAM UNDER ANESTHESIA;  Surgeon: Ileana Roup, MD;  Location: [facility];  Service: General;  Laterality: N/A;  . Daniel    . TONSILLECTOMY    . VAGINAL HYSTERECTOMY       OB History   No obstetric history on file.     Family History  Problem Relation Age of Onset  . Hypertension Mother   . Hyperlipidemia Mother   . Neurologic Disorder Mother 41       GB  . Stroke Son   . Stroke Maternal Uncle   . Stroke Grandchild   . Prostate cancer Brother     Social History  Tobacco Use  . Smoking status: Never Smoker  . Smokeless tobacco: Never Used  Vaping Use  . Vaping Use: Never used  Substance Use Topics  . Alcohol use: No    Alcohol/week: 0.0 standard drinks  . Drug use: No    Home Medications Prior to Admission medications   Medication Sig Start Date End Date Taking? Authorizing Provider  acetaminophen (TYLENOL) 500 MG tablet Take 500 mg by mouth every 6 (six) hours as needed for moderate pain or headache.    [provider]  amLODipine (NORVASC) 5 MG tablet TAKE 1 AND  1/2 TABLETS BY  MOUTH DAILY 04/30/21   Loman Brooklyn, FNP  anastrozole (ARIMIDEX) 1 MG tablet Take 1 tablet (1 mg total) by mouth daily. 02/04/21   [name], MD  cephALEXin (KEFLEX) 500 MG capsule Take 1 capsule (500 mg total) by mouth 2 (two) times daily. 04/30/21   Gwenlyn Perking, FNP  Cholecalciferol (VITAMIN D3) 5000 units CAPS Take 5,000 Units by mouth at bedtime.    [provider]  dofetilide (TIKOSYN) 500 MCG capsule TAKE 1 CAPSULE (500 MCG TOTAL) BY MOUTH 2 (TWO) TIMES DAILY. 01/09/21   Sherran Needs, NP  DULoxetine (CYMBALTA) 30 MG capsule TAKE 1 CAPSULE BY MOUTH  DAILY 02/04/21   Ronnie Doss M, DO  ezetimibe (ZETIA) 10 MG tablet TAKE 1 TABLET BY MOUTH  DAILY 05/06/21   Ronnie Doss M, DO  famotidine (PEPCID) 20 MG tablet TAKE 1 TABLET BY MOUTH  TWICE DAILY 12/12/20   Ronnie Doss M, DO  furosemide (LASIX) 20 MG tablet TAKE 1 TABLET BY MOUTH  DAILY 05/06/21   Ronnie Doss M, DO  loratadine (CLARITIN) 10 MG tablet Take 1 tablet (10 mg total) by mouth daily. 02/19/20   Ronnie Doss M, DO  olmesartan (BENICAR) 5 MG tablet TAKE 1 TABLET BY MOUTH  DAILY 05/06/21   Ronnie Doss M, DO  potassium chloride SA (KLOR-CON) 20 MEQ tablet TAKE 1 TABLET BY MOUTH  TWICE DAILY 05/06/21   Ronnie Doss M, DO  rivaroxaban (XARELTO) 20 MG TABS tablet TAKE 1 TABLET (20 MG TOTAL) BY MOUTH DAILY WITH SUPPER. 12/18/20   Minus Breeding, MD    Allergies    Augmentin [amoxicillin-pot clavulanate], Codeine, and Prednisone  Review of Systems   Review of Systems  All other systems reviewed and are negative.   Physical Exam Updated Vital Signs BP (!) 171/85 (BP Location: Left Arm)   Pulse 83   Temp 98.7 F (37.1 C) (Oral)   SpO2 96%   Physical Exam Vitals and nursing note reviewed.  Constitutional:      General: She is not in acute distress.    Appearance: Normal appearance. She is well-developed. She is not toxic-appearing.  HENT:     Head: Normocephalic  and atraumatic.  Eyes:     General: Lids are normal.     Conjunctiva/sclera: Conjunctivae normal.     Pupils: Pupils are equal, round, and reactive to light.  Neck:     Thyroid: No thyroid mass.     Trachea: No tracheal deviation.  Cardiovascular:     Rate and Rhythm: Normal rate and regular rhythm.     Heart sounds: Normal heart sounds. No murmur heard. No gallop.   Pulmonary:     Effort: Pulmonary effort is normal. No respiratory distress.     Breath sounds: Normal breath sounds. No stridor. No decreased breath sounds, wheezing, rhonchi or rales.  Abdominal:     General:  Bowel sounds are normal. There is no distension.     Palpations: Abdomen is soft.     Tenderness: There is no abdominal tenderness. There is no rebound.  Musculoskeletal:        General: No tenderness. Normal range of motion.     Cervical back: Normal range of motion and neck supple.  Skin:    General: Skin is warm and dry.     Findings: No abrasion or rash.  Neurological:     Mental Status: She is alert and oriented to person, place, and time.     GCS: GCS eye subscore is 4. GCS verbal subscore is 5. GCS motor subscore is 6.     Cranial Nerves: No cranial nerve deficit.     Sensory: No sensory deficit.  Psychiatric:        Speech: Speech normal.        Behavior: Behavior normal.     ED Results / Procedures / Treatments   Labs (all labs ordered are listed, but only abnormal results are displayed) Labs Reviewed  URINE CULTURE  URINALYSIS, ROUTINE W REFLEX MICROSCOPIC    EKG None  Radiology No results found.  Procedures Procedures   Medications Ordered in ED Medications - No data to display  ED Course  I have reviewed the triage vital signs and the nursing notes.  Pertinent labs & imaging results that were available during my care of the patient were reviewed by me and considered in my medical decision making (see chart for details).    MDM Rules/Calculators/A&P                           Patient is urinalysis is significant for hematuria with some white cells.  CT renal stone study shows that patient may have passed a kidney stone.  Patient did note that last night she had significant right-sided flank pain.  It is since resolved.  Patient has scheduled appointment for urology tomorrow.  He had materia is likely secondary to being on Xarelto.  She may have passed a kidney stone.  She is able to urinate without difficulty.  Will discharge home Final diagnoses:  None    Rx / DC Orders ED Discharge Orders    None       Lacretia Leigh, MD 05/06/21 1342

## 2021-05-07 DIAGNOSIS — R310 Gross hematuria: Secondary | ICD-10-CM | POA: Diagnosis not present

## 2021-05-08 ENCOUNTER — Inpatient Hospital Stay: Payer: Medicare Other | Admitting: Adult Health

## 2021-05-08 LAB — URINE CULTURE: Culture: >=100000 — AB

## 2021-05-09 ENCOUNTER — Telehealth: Payer: Self-pay | Admitting: *Deleted

## 2021-05-09 NOTE — Telephone Encounter (Signed)
Post ED Visit - Positive Culture Follow-up  Culture report reviewed by antimicrobial stewardship pharmacist: [facility] Team []  Elenor Quinones, Pharm.D. []  Heide Guile, Pharm.D., BCPS AQ-ID []  Parks Neptune, Pharm.D., BCPS []  Alycia Rossetti, Pharm.D., BCPS []  [place], [place].D., BCPS, AAHIVP []  Legrand Como, Pharm.D., BCPS, AAHIVP []  Salome Arnt, PharmD, BCPS []  Johnnette Gourd, PharmD, BCPS []  Hughes Better, PharmD, BCPS []  Leeroy Cha, PharmD []  Laqueta Linden, PharmD, BCPS []  Albertina Parr, PharmD  [facility] Team []  Leodis Sias, PharmD []  Lindell Spar, PharmD []  Royetta Asal, PharmD []  Graylin Shiver, Rph []  Rema Fendt) Glennon Mac, PharmD []  Arlyn Dunning, PharmD []  Netta Cedars, PharmD []  Dia Sitter, PharmD []  Leone Haven, PharmD []  Gretta Arab, PharmD []  Theodis Shove, PharmD []  Peggyann Juba, PharmD []  Reuel Boom, PharmD   Positive urine culture Symptoms resolved after kidney stone passage and no further patient follow-up is required at this time.  Harlon Flor [facility] 05/09/2021, 11:56 AM

## 2021-05-09 NOTE — Progress Notes (Signed)
ED Antimicrobial Stewardship Positive Culture Follow Up   [name] Collier is an 75 y.o. female who presented to [facility] on 05/06/2021 with a chief complaint of  Chief Complaint  Patient presents with  . Hematuria    Recent Results (from the past 720 hour(s))  Microscopic Examination     Status: Abnormal   Collection Time: 04/30/21 11:38 AM   Urine  Result Value Ref Range Status   WBC, UA 0-5 0 - 5 /hpf Final   RBC >30 (A) 0 - 2 /hpf Final   Epithelial Cells (non renal) 0-10 0 - 10 /hpf Final   Bacteria, UA None seen None seen/Few Final  Urine Culture     Status: None   Collection Time: 04/30/21 12:17 PM   Specimen: Urine   UR  Result Value Ref Range Status   Urine Culture, Routine Final report  Final   Organism ID, Bacteria Comment  Final    Comment: Mixed urogenital flora Less than 10,000 colonies/mL   Urine Culture     Status: Abnormal   Collection Time: 05/06/21 12:13 PM   Specimen: Urine, Clean Catch  Result Value Ref Range Status   Specimen Description   Final    URINE, CLEAN CATCH Performed at [facility], [place] [address], [place], [place] [address]    Special Requests   Final    NONE Performed at [facility], [place] [address], [place], [place] [address]    Culture >=100,000 COLONIES/mL [facility] MORGANII (A)  Final   Report Status 05/08/2021 FINAL  Final   Organism ID, Bacteria MORGANELLA MORGANII (A)  Final      Susceptibility   Morganella morganii - MIC*    AMPICILLIN >=32 RESISTANT Resistant     CEFAZOLIN >=64 RESISTANT Resistant     CIPROFLOXACIN <=0.25 SENSITIVE Sensitive     GENTAMICIN <=1 SENSITIVE Sensitive     IMIPENEM 4 SENSITIVE Sensitive     NITROFURANTOIN 128 RESISTANT Resistant     TRIMETH/SULFA <=20 SENSITIVE Sensitive     AMPICILLIN/SULBACTAM >=32 RESISTANT Resistant     PIP/TAZO <=4 SENSITIVE Sensitive     * >=100,000 COLONIES/mL MORGANELLA MORGANII    74 YOF who presented to the ED on 5/31 with  hematuria x1 week. Reported abdominal pain and right sided flank pain which had since resolved. Denied fever and dysuria. CT renal scan showed no hydronephrosis. ED provider believes she recently passed a kidney stone. On Xarelto which could have contributed to hematuria. Called pt who's sxs were improved. Was seen by urology 6/1, pt was continued on Keflex for 5 more days (end date 6/6). No further change in treatment necessary.  ED Provider: Alyse Low, PA-C   Debria Garret, Student Pharmacist 05/09/2021, 9:50 AM

## 2021-05-10 ENCOUNTER — Other Ambulatory Visit: Payer: Self-pay | Admitting: Cardiology

## 2021-05-12 NOTE — Telephone Encounter (Signed)
56f, 128.4kg, scr 0.73 01/03/21, lovw/carroll donna, ccr 137

## 2021-05-21 DIAGNOSIS — R8279 Other abnormal findings on microbiological examination of urine: Secondary | ICD-10-CM | POA: Diagnosis not present

## 2021-05-21 DIAGNOSIS — R310 Gross hematuria: Secondary | ICD-10-CM | POA: Diagnosis not present

## 2021-05-23 ENCOUNTER — Other Ambulatory Visit: Payer: Self-pay

## 2021-05-23 ENCOUNTER — Encounter: Payer: Self-pay | Admitting: Adult Health

## 2021-05-23 ENCOUNTER — Inpatient Hospital Stay: Payer: Medicare Other | Attending: Adult Health | Admitting: Adult Health

## 2021-05-23 VITALS — BP 175/63 | HR 80 | Temp 97.6°F | Resp 19 | Ht 66.0 in | Wt 282.5 lb

## 2021-05-23 DIAGNOSIS — C50212 Malignant neoplasm of upper-inner quadrant of left female breast: Secondary | ICD-10-CM

## 2021-05-23 DIAGNOSIS — N951 Menopausal and female climacteric states: Secondary | ICD-10-CM | POA: Diagnosis not present

## 2021-05-23 DIAGNOSIS — Z170 Estrogen receptor positive status [ER+]: Secondary | ICD-10-CM

## 2021-05-23 DIAGNOSIS — Z923 Personal history of irradiation: Secondary | ICD-10-CM | POA: Diagnosis not present

## 2021-05-23 NOTE — Progress Notes (Signed)
SURVIVORSHIP VISIT:   BRIEF ONCOLOGIC HISTORY:  Oncology History  Carcinoma of upper-inner quadrant of left breast in female, estrogen receptor positive ([place])  12/20/2020 Initial Diagnosis   Screening mammogram detected a left breast asymmetry. Diagnostic mammogram and US showed a 0.7cm mass at the 10 o'clock position and normal left axillary lymph nodes. Biopsy showed invasive lobular carcinoma, grade 1-2, HER-2 negative (1+), ER+ 50%, PR+ 90%, Ki67 10%.   12/25/2020 Cancer Staging   Staging form: Breast, AJCC 8th Edition - Clinical: Stage IA (cT1b, cN0, cM0, G2, ER+, PR+, HER2-)   01/07/2021 Surgery   Left lumpectomy ([place]) ([id number]): invasive and in situ lobular carcinoma, grade 2, 1.8cm, clear margins, and no evidence of carcinoma in 3 left axillary lymph nodes.   01/20/2021 Oncotype testing   The Oncotype DX score was 10 predicting a risk of outside the breast recurrence over the next 9 years of 3% if the patient's only systemic therapy is tamoxifen for 5 years.     02/18/2021 - 03/12/2021 Radiation Therapy   The patient initially received a dose of 42.56 Gy in 16 fractions to the breast using whole-breast tangent fields. This was delivered using a 3-D conformal technique.   02/2021 - 02/2026 Anti-estrogen oral therapy   Anastrozole     INTERVAL HISTORY:  Ms. Kutsch to review her survivorship care plan detailing her treatment course for breast cancer, as well as monitoring long-term side effects of that treatment, education regarding health maintenance, screening, and overall wellness and health promotion.     Overall, Ms. Jennette reports feeling quite well.  She is taking anastrozole daily and tolerates it well.  She has some stress that she reports and some hot flashes.  She says that she struggles with fear of recurrence.  Otherwise, she is well and without concerns.  REVIEW OF SYSTEMS:  Review of Systems  Constitutional:  Negative for appetite change, chills, fatigue, fever  and unexpected weight change.  HENT:   Negative for hearing loss, lump/mass and trouble swallowing.   Eyes:  Negative for eye problems and icterus.  Respiratory:  Negative for chest tightness, cough and shortness of breath.   Cardiovascular:  Negative for chest pain, leg swelling and palpitations.  Gastrointestinal:  Negative for abdominal distention, abdominal pain, constipation, diarrhea, nausea and vomiting.  Endocrine: Positive for hot flashes.  Genitourinary:  Negative for difficulty urinating.   Musculoskeletal:  Negative for arthralgias.  Skin:  Negative for itching and rash.  Neurological:  Negative for dizziness, extremity weakness, headaches and numbness.  Hematological:  Negative for adenopathy. Does not bruise/bleed easily.  Psychiatric/Behavioral:  Negative for depression. The patient is nervous/anxious.   Breast: Denies any new nodularity, masses, tenderness, nipple changes, or nipple discharge.      ONCOLOGY TREATMENT TEAM:  1. Surgeon:  Dr. Georgette Dover at [facility] 2. Medical Oncologist: Dr. Lindi Adie  3. Radiation Oncologist: Dr. Isidore Moos    PAST MEDICAL/SURGICAL HISTORY:  Past Medical History:  Diagnosis Date   Anxiety    Atrial fibrillation ([place])    Cancer ([place]) 11/2020   left breast IMC   GERD (gastroesophageal reflux disease)    HTN (hypertension)    Hyperlipidemia    TIA (transient ischemic attack)    Past Surgical History:  Procedure Laterality Date   BOTOX INJECTION N/A 12/22/2018   Procedure: BOTOX INJECTION INTO ANAL SPHINCTER;  Surgeon: Ileana Roup, MD;  Location: [facility];  Service: General;  Laterality: N/A;   BREAST BIOPSY Left 11/26/2020   BREAST LUMPECTOMY  WITH RADIOACTIVE SEED AND SENTINEL LYMPH NODE BIOPSY Left 01/07/2021   Procedure: LEFT BREAST LUMPECTOMY WITH RADIOACTIVE SEED AND LEFT AXILLARY SENTINEL LYMPH NODE BIOPSY WITH BLUE DYE INJECTION;  Surgeon: Donnie Mesa, MD;  Location: [place];  Service: General;   Laterality: Left;  LMA WITH PECTORAL BLOCK, BLUE DYE INJECTION   CARDIOVERSION N/A 01/04/2018   Procedure: CARDIOVERSION;  Surgeon: Dorothy Spark, MD;  Location: [facility];  Service: Cardiovascular;  Laterality: N/A;   CARDIOVERSION N/A 09/20/2018   Procedure: CARDIOVERSION;  Surgeon: Sueanne Margarita, MD;  Location: [place] ENDOSCOPY;  Service: Cardiovascular;  Laterality: N/A;   CARDIOVERSION N/A 10/27/2018   Procedure: CARDIOVERSION;  Surgeon: Buford Dresser, MD;  Location: [place];  Service: Cardiovascular;  Laterality: N/A;   KNEE SURGERY Bilateral    Torn miniscus   RECTAL EXAM UNDER ANESTHESIA N/A 12/22/2018   Procedure: ANORECTAL  EXAM UNDER ANESTHESIA;  Surgeon: Ileana Roup, MD;  Location: [facility];  Service: General;  Laterality: N/A;   RECTOCELE REPAIR     TONSILLECTOMY     VAGINAL HYSTERECTOMY       ALLERGIES:  Allergies  Allergen Reactions   Augmentin [Amoxicillin-Pot Clavulanate] Other (See Comments)    c-diff Has patient had a PCN reaction causing immediate rash, facial/tongue/throat swelling, SOB or lightheadedness with hypotension: No Has patient had a PCN reaction causing severe rash involving mucus membranes or skin necrosis: No Has patient had a PCN reaction that required hospitalization: No Has patient had a PCN reaction occurring within the last 10 years: Yes If all of the above answers are "NO", then may proceed with Cephalosporin use.    Codeine Palpitations and Rash   Prednisone Other (See Comments)    Jittery, red in the face     CURRENT MEDICATIONS:  Outpatient Encounter Medications as of 05/23/2021  Medication Sig   amLODipine (NORVASC) 5 MG tablet TAKE 1 AND 1/2 TABLETS BY  MOUTH DAILY (Patient taking differently: Take 5 mg by mouth daily.)   anastrozole (ARIMIDEX) 1 MG tablet Take 1 tablet (1 mg total) by mouth daily.   calcium gluconate 500 MG tablet Take 1 tablet by mouth 3 (three) times daily.   Cholecalciferol (VITAMIN D3)  5000 units CAPS Take 5,000 Units by mouth at bedtime.   dofetilide (TIKOSYN) 500 MCG capsule TAKE 1 CAPSULE (500 MCG TOTAL) BY MOUTH 2 (TWO) TIMES DAILY.   DULoxetine (CYMBALTA) 30 MG capsule TAKE 1 CAPSULE BY MOUTH  DAILY   ezetimibe (ZETIA) 10 MG tablet TAKE 1 TABLET BY MOUTH  DAILY   famotidine (PEPCID) 20 MG tablet TAKE 1 TABLET BY MOUTH  TWICE DAILY   furosemide (LASIX) 20 MG tablet TAKE 1 TABLET BY MOUTH  DAILY   olmesartan (BENICAR) 5 MG tablet TAKE 1 TABLET BY MOUTH  DAILY   potassium chloride SA (KLOR-CON) 20 MEQ tablet TAKE 1 TABLET BY MOUTH  TWICE DAILY   XARELTO 20 MG TABS tablet TAKE 1 TABLET BY MOUTH DAILY WITH SUPPER.   acetaminophen (TYLENOL) 500 MG tablet Take 500 mg by mouth every 6 (six) hours as needed for moderate pain or headache.   [DISCONTINUED] cephALEXin (KEFLEX) 500 MG capsule Take 1 capsule (500 mg total) by mouth 2 (two) times daily.   [DISCONTINUED] loratadine (CLARITIN) 10 MG tablet Take 1 tablet (10 mg total) by mouth daily.   No facility-administered encounter medications on file as of 05/23/2021.     ONCOLOGIC FAMILY HISTORY:  Family History  Problem Relation Age of Onset  Hypertension Mother    Hyperlipidemia Mother    Neurologic Disorder Mother 57       GB   Stroke Son    Stroke Maternal Uncle    Stroke Grandchild    Prostate cancer Brother      GENETIC COUNSELING/TESTING: Not at this time  SOCIAL HISTORY:  Social History   Socioeconomic History   Marital status: Widowed    Spouse name: Not on file   Number of children: 2   Years of education: 100   Highest education level: Not on file  Occupational History   Occupation: Retired    Comment: Personnel officer  Tobacco Use   Smoking status: Never   Smokeless tobacco: Never  Scientific laboratory technician Use: Never used  Substance and Sexual Activity   Alcohol use: No    Alcohol/week: 0.0 standard drinks   Drug use: No   Sexual activity: Not Currently    Birth control/protection:  Post-menopausal  Other Topics Concern   Not on file  Social History Narrative   Lives alone - children in [place] -    Caffeine use: Soda/tea daily   Good church support group   Social Determinants of Health   Financial Resource Strain: Low Risk    Difficulty of Paying Living Expenses: Not hard at all  Food Insecurity: No Food Insecurity   Worried About Charity fundraiser in the Last Year: Never true   Arboriculturist in the Last Year: Never true  Transportation Needs: No Transportation Needs   Lack of Transportation (Medical): No   Lack of Transportation (Non-Medical): No  Physical Activity: Insufficiently Active   Days of Exercise per Week: 7 days   Minutes of Exercise per Session: 10 min  Stress: Stress Concern Present   Feeling of Stress : To some extent  Social Connections: Moderately Integrated   Frequency of Communication with Friends and Family: More than three times a week   Frequency of Social Gatherings with Friends and Family: More than three times a week   Attends Religious Services: More than 4 times per year   Active Member of [employer] or Organizations: Yes   Attends Archivist Meetings: More than 4 times per year   Marital Status: Widowed  Human resources officer Violence: Not At Risk   Fear of Current or Ex-Partner: No   Emotionally Abused: No   Physically Abused: No   Sexually Abused: No     OBSERVATIONS/OBJECTIVE:  BP (!) 175/63 (BP Location: Left Arm, Patient Position: Sitting)   Pulse 80   Temp 97.6 F (36.4 C) (Temporal)   Resp 19   Ht _0  (1.676 m)   Wt 282 lb 8 oz (128.1 kg)   SpO2 96%   BMI 45.60 kg/m  GENERAL: Patient is a well appearing female in no acute distress HEENT:  Sclerae anicteric.  Oropharynx clear and moist. No ulcerations or evidence of oropharyngeal candidiasis. Neck is supple.  NODES:  No cervical, supraclavicular, or axillary lymphadenopathy palpated.  BREAST EXAM:  Deferred. LUNGS:  Clear to auscultation  bilaterally.  No wheezes or rhonchi. HEART:  Regular rate and rhythm. No murmur appreciated. ABDOMEN:  Soft, nontender.  Positive, normoactive bowel sounds. No organomegaly palpated. MSK:  No focal spinal tenderness to palpation. Full range of motion bilaterally in the upper extremities. EXTREMITIES:  No peripheral edema.   SKIN:  Clear with no obvious rashes or skin changes. No nail dyscrasia. NEURO:  Nonfocal. Well oriented.  Appropriate affect.   LABORATORY DATA:  None for this visit.  DIAGNOSTIC IMAGING:  None for this visit.      ASSESSMENT AND PLAN:  Ms.. Lunt is a pleasant 75 y.o. female with Stage IA left breast invasive lobular carcinoma, ER+/PR+/HER2-, diagnosed in 12/2020, treated with lumpectomy, adjuvant radiation therapy, and anti-estrogen therapy with Anastrozole beginning in 02/2021.  She presents to the Survivorship Clinic for our initial meeting and routine follow-up post-completion of treatment for breast cancer.    1. Stage IA left breast cancer:  Ms. Rummell is continuing to recover from definitive treatment for breast cancer. She will follow-up with her medical oncologist, Dr. Lindi Adie in 6 months with history and physical exam per surveillance protocol.  She will continue her anti-estrogen therapy with Anastrozole. Thus far, she is tolerating the Anastrozole well, with minimal side effects. She was instructed to make Dr. Lindi Adie or myself aware if she begins to experience any worsening side effects of the medication and I could see her back in clinic to help manage those side effects, as needed. Her mammogram is due 10/2021; orders placed today. Today, a comprehensive survivorship care plan and treatment summary was reviewed with the patient today detailing her breast cancer diagnosis, treatment course, potential late/long-term effects of treatment, appropriate follow-up care with recommendations for the future, and patient education resources.  A copy of this summary, along  with a letter will be sent to the patient's primary care provider via mail/fax/In Basket message after today's visit.    2. + distress screen: I gave the QOL CSV survey and results will be forwarded to social work for f/u.    3. Hot flashes I reviewed non pharmacologic interventions which are in her SCP.  These are manageable at this point.  4. Bone health:  Given Ms. Monda's age/history of breast cancer and her current treatment regimen including anti-estrogen therapy with Anastrozole, she is at risk for bone demineralization.  Her last DEXA scan was 03/2021, which showed mild osteopenia.  We will repeat bone density testing in 2 years.  In the meantime, she was encouraged to increase her consumption of foods rich in calcium, as well as increase her weight-bearing activities.  She was given education on specific activities to promote bone health.  5 Cancer screening:  Due to Ms. Kluth's history and her age, she should receive screening for skin cancers, colon cancer, and gynecologic cancers.  The information and recommendations are listed on the patient's comprehensive care plan/treatment summary and were reviewed in detail with the patient.    6. Health maintenance and wellness promotion: Ms. Henzler was encouraged to consume 5-7 servings of fruits and vegetables per day. We reviewed the "Nutrition Rainbow" handout, as well as the handout "Take Control of Your Health and Reduce Your Cancer Risk" from the [place].  She was also encouraged to engage in moderate to vigorous exercise for 30 minutes per day most days of the week. We discussed the [employer] fitness program, which is designed for cancer survivors to help them become more physically fit after cancer treatments.  She was instructed to limit her alcohol consumption and continue to abstain from tobacco use.     7. Support services/counseling: It is not uncommon for this period of the patient's cancer care trajectory to be  one of many emotions and stressors.  We discussed how this can be increasingly difficult during the times of quarantine and social distancing due to the COVID-19 pandemic.   She was  given information regarding our available services and encouraged to contact me with any questions or for help enrolling in any of our support group/programs.    Follow up instructions:    -Return to cancer center in 6 months for LTS with Dr. Lindi Adie  -Mammogram due in 10/2021 -Follow up with surgery in one year -Bone density in 03/2023 -She is welcome to return back to the Survivorship Clinic at any time; no additional follow-up needed at this time.  -Consider referral back to survivorship as a long-term survivor for continued surveillance  The patient was provided an opportunity to ask questions and all were answered. The patient agreed with the plan and demonstrated an understanding of the instructions.   Total encounter time: 30 minutes in face to face visit time, chart review, documentation.  Wilber Bihari, NP 05/23/21 3:09 PM Medical Oncology and Hematology [facility] [place], [place] [address] Tel. [phone]    Fax. [phone]  *Total Encounter Time as defined by the Centers for Medicare and Medicaid Services includes, in addition to the face-to-face time of a patient visit (documented in the note above) non-face-to-face time: obtaining and reviewing outside history, ordering and reviewing medications, tests or procedures, care coordination (communications with other health care professionals or caregivers) and documentation in the medical record.

## 2021-05-30 ENCOUNTER — Encounter: Payer: Self-pay | Admitting: Family Medicine

## 2021-05-30 ENCOUNTER — Ambulatory Visit (INDEPENDENT_AMBULATORY_CARE_PROVIDER_SITE_OTHER): Payer: Medicare Other | Admitting: Family Medicine

## 2021-05-30 ENCOUNTER — Other Ambulatory Visit: Payer: Self-pay

## 2021-05-30 VITALS — BP 150/90 | HR 83 | Temp 98.9°F | Ht 66.0 in | Wt 282.2 lb

## 2021-05-30 DIAGNOSIS — I480 Paroxysmal atrial fibrillation: Secondary | ICD-10-CM

## 2021-05-30 DIAGNOSIS — C50212 Malignant neoplasm of upper-inner quadrant of left female breast: Secondary | ICD-10-CM

## 2021-05-30 DIAGNOSIS — Z170 Estrogen receptor positive status [ER+]: Secondary | ICD-10-CM

## 2021-05-30 DIAGNOSIS — D692 Other nonthrombocytopenic purpura: Secondary | ICD-10-CM

## 2021-05-30 DIAGNOSIS — Z7901 Long term (current) use of anticoagulants: Secondary | ICD-10-CM | POA: Diagnosis not present

## 2021-05-30 DIAGNOSIS — D6869 Other thrombophilia: Secondary | ICD-10-CM | POA: Diagnosis not present

## 2021-05-30 NOTE — Progress Notes (Signed)
Subjective: CC: f/u afib, breast cancer PCP: [name], DO YTK:PTWSF [name] Collier is a 75 y.o. female presenting to clinic today for:  1. Afib Patient has been doing okay from an A. fib standpoint.  No reports of change in exercise tolerance, no heart palpitations.  She did have hematuria at 1 point.  This however ended up being secondary to renal stone that was passed.  She is not had any recurrent hematuria.  She is compliant with all medications and does not think she needs any refills yet  2. Breast cancer Status post almost 20 rounds of radiation treatment to the left breast.  She is got some hyperpigmentation along the left breast but overall symptoms have been fine since her treatment.  She continues to follow-up very closely with oncology.   ROS: Per HPI  Allergies  Allergen Reactions   Augmentin [Amoxicillin-Pot Clavulanate] Other (See Comments)    c-diff Has patient had a PCN reaction causing immediate rash, facial/tongue/throat swelling, SOB or lightheadedness with hypotension: No Has patient had a PCN reaction causing severe rash involving mucus membranes or skin necrosis: No Has patient had a PCN reaction that required hospitalization: No Has patient had a PCN reaction occurring within the last 10 years: Yes If all of the above answers are "NO", then may proceed with Cephalosporin use.    Codeine Palpitations and Rash   Prednisone Other (See Comments)    Jittery, red in the face   Past Medical History:  Diagnosis Date   Anxiety    Atrial fibrillation ([place])    Cancer ([place]) 11/2020   left breast IMC   GERD (gastroesophageal reflux disease)    HTN (hypertension)    Hyperlipidemia    TIA (transient ischemic attack)     Current Outpatient Medications:    acetaminophen (TYLENOL) 500 MG tablet, Take 500 mg by mouth every 6 (six) hours as needed for moderate pain or headache., Disp: , Rfl:    amLODipine (NORVASC) 5 MG tablet, TAKE 1 AND 1/2 TABLETS BY  MOUTH  DAILY (Patient taking differently: Take 5 mg by mouth daily.), Disp: 135 tablet, Rfl: 0   anastrozole (ARIMIDEX) 1 MG tablet, Take 1 tablet (1 mg total) by mouth daily., Disp: 90 tablet, Rfl: 3   calcium gluconate 500 MG tablet, Take 1 tablet by mouth 3 (three) times daily., Disp: , Rfl:    Cholecalciferol (VITAMIN D3) 5000 units CAPS, Take 5,000 Units by mouth at bedtime., Disp: , Rfl:    dofetilide (TIKOSYN) 500 MCG capsule, TAKE 1 CAPSULE (500 MCG TOTAL) BY MOUTH 2 (TWO) TIMES DAILY., Disp: 60 capsule, Rfl: 6   DULoxetine (CYMBALTA) 30 MG capsule, TAKE 1 CAPSULE BY MOUTH  DAILY, Disp: 90 capsule, Rfl: 3   ezetimibe (ZETIA) 10 MG tablet, TAKE 1 TABLET BY MOUTH  DAILY, Disp: 90 tablet, Rfl: 0   famotidine (PEPCID) 20 MG tablet, TAKE 1 TABLET BY MOUTH  TWICE DAILY, Disp: 180 tablet, Rfl: 3   furosemide (LASIX) 20 MG tablet, TAKE 1 TABLET BY MOUTH  DAILY, Disp: 90 tablet, Rfl: 0   olmesartan (BENICAR) 5 MG tablet, TAKE 1 TABLET BY MOUTH  DAILY, Disp: 90 tablet, Rfl: 0   potassium chloride SA (KLOR-CON) 20 MEQ tablet, TAKE 1 TABLET BY MOUTH  TWICE DAILY, Disp: 180 tablet, Rfl: 0   XARELTO 20 MG TABS tablet, TAKE 1 TABLET BY MOUTH DAILY WITH SUPPER., Disp: 90 tablet, Rfl: 1 Social History   Socioeconomic History   Marital status: Widowed  Spouse name: Not on file   Number of children: 2   Years of education: 29   Highest education level: Not on file  Occupational History   Occupation: Retired    Comment: Personnel officer  Tobacco Use   Smoking status: Never   Smokeless tobacco: Never  Vaping Use   Vaping Use: Never used  Substance and Sexual Activity   Alcohol use: No    Alcohol/week: 0.0 standard drinks   Drug use: No   Sexual activity: Not Currently    Birth control/protection: Post-menopausal  Other Topics Concern   Not on file  Social History Narrative   Lives alone - children in [place] -    Caffeine use: Soda/tea daily   Good church support group   Social  Determinants of Health   Financial Resource Strain: Low Risk    Difficulty of Paying Living Expenses: Not hard at all  Food Insecurity: No Food Insecurity   Worried About Charity fundraiser in the Last Year: Never true   Arboriculturist in the Last Year: Never true  Transportation Needs: No Transportation Needs   Lack of Transportation (Medical): No   Lack of Transportation (Non-Medical): No  Physical Activity: Insufficiently Active   Days of Exercise per Week: 7 days   Minutes of Exercise per Session: 10 min  Stress: Stress Concern Present   Feeling of Stress : To some extent  Social Connections: Moderately Integrated   Frequency of Communication with Friends and Family: More than three times a week   Frequency of Social Gatherings with Friends and Family: More than three times a week   Attends Religious Services: More than 4 times per year   Active Member of [employer] or Organizations: Yes   Attends Archivist Meetings: More than 4 times per year   Marital Status: Widowed  Human resources officer Violence: Not At Risk   Fear of Current or Ex-Partner: No   Emotionally Abused: No   Physically Abused: No   Sexually Abused: No   Family History  Problem Relation Age of Onset   Hypertension Mother    Hyperlipidemia Mother    Neurologic Disorder Mother 45       GB   Stroke Son    Stroke Maternal Uncle    Stroke Grandchild    Prostate cancer Brother     Objective: Office vital signs reviewed. BP (!) 150/90   Pulse 83   Temp 98.9 F (37.2 C)   Ht 5' 6"  (1.676 m)   Wt 282 lb 3.2 oz (128 kg)   SpO2 94%   BMI 45.55 kg/m   Physical Examination:  General: Awake, alert, well nourished, No acute distress HEENT: Normal, sclera white Cardio: regular rate and rhythm, S1S2 heard, no murmurs appreciated Pulm: clear to auscultation bilaterally, no wheezes, rhonchi or rales; normal work of breathing on room air Skin: Senile purpura noted along the forearms  bilaterally  Assessment/ Plan: 75 y.o. female   PAF (paroxysmal atrial fibrillation) (HCC) - Plan: CBC, CMP14+EGFR, TSH  Chronic anticoagulation - Plan: CBC  Acquired thrombophilia (HCC)  Purpura senilis (HCC) - Plan: CBC  Carcinoma of upper-inner quadrant of left breast in female, estrogen receptor positive ([place]) - Plan: CBC  Seems to be both rate and rhythm controlled today.  Check CBC, CMP and TSH  Continues to show senile purpura along the upper extremities.  This is likely secondary to chronic anticoagulation for the A. Fib  Carcinoma status posttreatment with  radiation.  She is currently being treated with tamoxifen.  We will follow along with oncology  No orders of the defined types were placed in this encounter.  No orders of the defined types were placed in this encounter.    [name], DO [place] [phone]

## 2021-05-31 LAB — CBC
Hematocrit: 41.8 % (ref 34.0–46.6)
Hemoglobin: 13.3 g/dL (ref 11.1–15.9)
MCH: 28.7 pg (ref 26.6–33.0)
MCHC: 31.8 g/dL (ref 31.5–35.7)
MCV: 90 fL (ref 79–97)
Platelets: 245 10*3/uL (ref 150–450)
RBC: 4.64 x10E6/uL (ref 3.77–5.28)
RDW: 12.9 % (ref 11.7–15.4)
WBC: 5.5 10*3/uL (ref 3.4–10.8)

## 2021-05-31 LAB — CMP14+EGFR
ALT: 17 IU/L (ref 0–32)
AST: 19 IU/L (ref 0–40)
Albumin/Globulin Ratio: 1.4 (ref 1.2–2.2)
Albumin: 4 g/dL (ref 3.7–4.7)
Alkaline Phosphatase: 87 IU/L (ref 44–121)
BUN/Creatinine Ratio: 18 (ref 12–28)
BUN: 15 mg/dL (ref 8–27)
Bilirubin Total: 1.1 mg/dL (ref 0.0–1.2)
CO2: 22 mmol/L (ref 20–29)
Calcium: 9.6 mg/dL (ref 8.7–10.3)
Chloride: 105 mmol/L (ref 96–106)
Creatinine, Ser: 0.83 mg/dL (ref 0.57–1.00)
Globulin, Total: 2.9 g/dL (ref 1.5–4.5)
Glucose: 109 mg/dL — ABNORMAL HIGH (ref 65–99)
Potassium: 4.7 mmol/L (ref 3.5–5.2)
Sodium: 144 mmol/L (ref 134–144)
Total Protein: 6.9 g/dL (ref 6.0–8.5)
eGFR: 74 mL/min/{1.73_m2} (ref >59)

## 2021-05-31 LAB — TSH: TSH: 4.4 u[IU]/mL (ref 0.450–4.500)

## 2021-06-09 ENCOUNTER — Other Ambulatory Visit: Payer: Self-pay | Admitting: Nurse Practitioner

## 2021-06-10 ENCOUNTER — Encounter: Payer: Self-pay | Admitting: Licensed Clinical Social Worker

## 2021-06-10 NOTE — Progress Notes (Signed)
[facility] Quality of Life Screening Clinical Social Work  Clinical Social Work was referred by survivorship quality of life screening protocol.  The patient scored a  39  on the Quality of Life/ Cancer Survivor (QOL-CS) scale which indicates moderate quality of life.   Clinical Social Worker  attempted to contact patient by phone  to assess for needs. Based on screener, fear of recurrence is primary concerns.   No answer. Left VM with direct contact information.      Breyanna Valera E Ronique Simerly, LCSW

## 2021-06-19 ENCOUNTER — Ambulatory Visit (HOSPITAL_COMMUNITY)
Admission: RE | Admit: 2021-06-19 | Discharge: 2021-06-19 | Disposition: A | Discharge status: Home / Self Care | Payer: Medicare Other | Source: Ambulatory Visit | Attending: Nurse Practitioner | Admitting: Nurse Practitioner

## 2021-06-19 ENCOUNTER — Encounter (HOSPITAL_COMMUNITY): Payer: Self-pay | Admitting: Nurse Practitioner

## 2021-06-19 ENCOUNTER — Other Ambulatory Visit: Payer: Self-pay

## 2021-06-19 VITALS — BP 172/86 | HR 68 | Ht 66.0 in | Wt 282.2 lb

## 2021-06-19 DIAGNOSIS — Z7901 Long term (current) use of anticoagulants: Secondary | ICD-10-CM | POA: Diagnosis not present

## 2021-06-19 DIAGNOSIS — I10 Essential (primary) hypertension: Secondary | ICD-10-CM | POA: Diagnosis not present

## 2021-06-19 DIAGNOSIS — I4819 Other persistent atrial fibrillation: Secondary | ICD-10-CM | POA: Insufficient documentation

## 2021-06-19 DIAGNOSIS — I480 Paroxysmal atrial fibrillation: Secondary | ICD-10-CM

## 2021-06-19 DIAGNOSIS — Z79899 Other long term (current) drug therapy: Secondary | ICD-10-CM | POA: Insufficient documentation

## 2021-06-19 DIAGNOSIS — Z8249 Family history of ischemic heart disease and other diseases of the circulatory system: Secondary | ICD-10-CM | POA: Insufficient documentation

## 2021-06-19 DIAGNOSIS — Z8673 Personal history of transient ischemic attack (TIA), and cerebral infarction without residual deficits: Secondary | ICD-10-CM | POA: Diagnosis not present

## 2021-06-19 DIAGNOSIS — D6869 Other thrombophilia: Secondary | ICD-10-CM | POA: Diagnosis not present

## 2021-06-19 LAB — BASIC METABOLIC PANEL
Anion gap: 8 (ref 5–15)
BUN: 12 mg/dL (ref 8–23)
CO2: 29 mmol/L (ref 22–32)
Calcium: 9.6 mg/dL (ref 8.9–10.3)
Chloride: 105 mmol/L (ref 98–111)
Creatinine, Ser: 0.73 mg/dL (ref 0.44–1.00)
GFR, Estimated: >60 mL/min (ref >60)
Glucose, Bld: 109 mg/dL — ABNORMAL HIGH (ref 70–99)
Potassium: 4.9 mmol/L (ref 3.5–5.1)
Sodium: 142 mmol/L (ref 135–145)

## 2021-06-19 LAB — MAGNESIUM: Magnesium: 2.2 mg/dL (ref 1.7–2.4)

## 2021-06-19 NOTE — Progress Notes (Signed)
Primary Care Physician: [name], DO Referring Physician: Dr. Radford Pax Cardiologist: Dr. Percival Spanish EP: Dr. [name] Hearing is a 75 y.o. female with a h/o afib, HTN, hyperlipidemia and TIA. She is in the afib clinic for f/u of Tikosyn use. She feels well with NSR today. She did have a mechanical fall a couple of weeks ago and was placed on metoprolol 25 mg bid for afib secondary to the fall. . She did convert in a couple of days and then stopped BB with issues with hypotension and bradycardia. Now however, her BP has been running higher, around [id number] systolic.  Continues on xarelto 20 mg daily with a CHA2DS2VASc of at least 5, without any signs of bleeding.   F/u with afib clinic 06/19/21. She is staying in [place] with Tikosyn. She has had an lumpectomy and radiation. Is finished with treatment now and is on daily. Arimidex. She is pending a colonoscopy and has received instruction re holding her [place]. Reminded to take Tikosyn with a sip of water.    Today, she denies symptoms of palpitations, chest pain, shortness of breath, orthopnea, PND, lower extremity edema, dizziness, presyncope, syncope, or neurologic sequela. The patient is tolerating medications without difficulties and is otherwise without complaint today.   Past Medical History:  Diagnosis Date   Anxiety    Atrial fibrillation ([place])    Cancer ([place]) 11/2020   left breast IMC   GERD (gastroesophageal reflux disease)    HTN (hypertension)    Hyperlipidemia    TIA (transient ischemic attack)    Past Surgical History:  Procedure Laterality Date   BOTOX INJECTION N/A 12/22/2018   Procedure: BOTOX INJECTION INTO ANAL SPHINCTER;  Surgeon: Ileana Roup, MD;  Location: [facility];  Service: General;  Laterality: N/A;   BREAST BIOPSY Left 11/26/2020   BREAST LUMPECTOMY WITH RADIOACTIVE SEED AND SENTINEL LYMPH NODE BIOPSY Left 01/07/2021   Procedure: LEFT BREAST LUMPECTOMY WITH RADIOACTIVE SEED AND LEFT AXILLARY SENTINEL  LYMPH NODE BIOPSY WITH BLUE DYE INJECTION;  Surgeon: Donnie Mesa, MD;  Location: [facility];  Service: General;  Laterality: Left;  LMA WITH PECTORAL BLOCK, BLUE DYE INJECTION   CARDIOVERSION N/A 01/04/2018   Procedure: CARDIOVERSION;  Surgeon: Dorothy Spark, MD;  Location: [facility];  Service: Cardiovascular;  Laterality: N/A;   CARDIOVERSION N/A 09/20/2018   Procedure: CARDIOVERSION;  Surgeon: Sueanne Margarita, MD;  Location: [facility];  Service: Cardiovascular;  Laterality: N/A;   CARDIOVERSION N/A 10/27/2018   Procedure: CARDIOVERSION;  Surgeon: Buford Dresser, MD;  Location: [facility];  Service: Cardiovascular;  Laterality: N/A;   KNEE SURGERY Bilateral    Torn miniscus   RECTAL EXAM UNDER ANESTHESIA N/A 12/22/2018   Procedure: ANORECTAL  EXAM UNDER ANESTHESIA;  Surgeon: Ileana Roup, MD;  Location: [facility];  Service: General;  Laterality: N/A;   RECTOCELE REPAIR     TONSILLECTOMY     VAGINAL HYSTERECTOMY      Current Outpatient Medications  Medication Sig Dispense Refill   acetaminophen (TYLENOL) 500 MG tablet Take 500 mg by mouth every 6 (six) hours as needed for moderate pain or headache.     amLODipine (NORVASC) 5 MG tablet TAKE 1 AND 1/2 TABLETS BY  MOUTH DAILY (Patient taking differently: Take 5 mg by mouth daily.) 135 tablet 0   anastrozole (ARIMIDEX) 1 MG tablet Take 1 tablet (1 mg total) by mouth daily. 90 tablet 3   calcium gluconate 500 MG tablet Take 1 tablet  by mouth 3 (three) times daily.     Cholecalciferol (VITAMIN D3) 5000 units CAPS Take 5,000 Units by mouth at bedtime.     dofetilide (TIKOSYN) 500 MCG capsule TAKE 1 CAPSULE BY MOUTH 2 TIMES DAILY. 180 capsule 2   DULoxetine (CYMBALTA) 30 MG capsule TAKE 1 CAPSULE BY MOUTH  DAILY 90 capsule 3   ezetimibe (ZETIA) 10 MG tablet TAKE 1 TABLET BY MOUTH  DAILY 90 tablet 0   famotidine (PEPCID) 20 MG tablet TAKE 1 TABLET BY MOUTH  TWICE DAILY 180 tablet 3   furosemide (LASIX) 20  MG tablet TAKE 1 TABLET BY MOUTH  DAILY 90 tablet 0   olmesartan (BENICAR) 5 MG tablet TAKE 1 TABLET BY MOUTH  DAILY 90 tablet 0   potassium chloride SA (KLOR-CON) 20 MEQ tablet TAKE 1 TABLET BY MOUTH  TWICE DAILY 180 tablet 0   XARELTO 20 MG TABS tablet TAKE 1 TABLET BY MOUTH DAILY WITH SUPPER. 90 tablet 1   polyethylene glycol-electrolytes (NULYTELY) 420 g solution See admin instructions. (Patient not taking: Reported on 06/19/2021)     No current facility-administered medications for this encounter.    Allergies  Allergen Reactions   Augmentin [Amoxicillin-Pot Clavulanate] Other (See Comments)    c-diff Has patient had a PCN reaction causing immediate rash, facial/tongue/throat swelling, SOB or lightheadedness with hypotension: No Has patient had a PCN reaction causing severe rash involving mucus membranes or skin necrosis: No Has patient had a PCN reaction that required hospitalization: No Has patient had a PCN reaction occurring within the last 10 years: Yes If all of the above answers are "NO", then may proceed with Cephalosporin use.    Codeine Palpitations and Rash   Prednisone Other (See Comments)    Jittery, red in the face    Social History   Socioeconomic History   Marital status: Widowed    Spouse name: Not on file   Number of children: 2   Years of education: 75   Highest education level: Not on file  Occupational History   Occupation: Retired    Comment: Personnel officer  Tobacco Use   Smoking status: Never   Smokeless tobacco: Never  Scientific laboratory technician Use: Never used  Substance and Sexual Activity   Alcohol use: No    Alcohol/week: 0.0 standard drinks   Drug use: No   Sexual activity: Not Currently    Birth control/protection: Post-menopausal  Other Topics Concern   Not on file  Social History Narrative   Lives alone - children in [place] -    Caffeine use: Soda/tea daily   Good church support group   Social Determinants of Health    Financial Resource Strain: Low Risk    Difficulty of Paying Living Expenses: Not hard at all  Food Insecurity: No Food Insecurity   Worried About Charity fundraiser in the Last Year: Never true   Arboriculturist in the Last Year: Never true  Transportation Needs: No Transportation Needs   Lack of Transportation (Medical): No   Lack of Transportation (Non-Medical): No  Physical Activity: Insufficiently Active   Days of Exercise per Week: 7 days   Minutes of Exercise per Session: 10 min  Stress: Stress Concern Present   Feeling of Stress : To some extent  Social Connections: Moderately Integrated   Frequency of Communication with Friends and Family: More than three times a week   Frequency of Social Gatherings with Friends and Family: More  than three times a week   Attends Religious Services: More than 4 times per year   Active Member of Clubs or Organizations: Yes   Attends Archivist Meetings: More than 4 times per year   Marital Status: Widowed  Human resources officer Violence: Not At Risk   Fear of Current or Ex-Partner: No   Emotionally Abused: No   Physically Abused: No   Sexually Abused: No    Family History  Problem Relation Age of Onset   Hypertension Mother    Hyperlipidemia Mother    Neurologic Disorder Mother 38       GB   Stroke Son    Stroke Maternal Uncle    Stroke Grandchild    Prostate cancer Brother     ROS- All systems are reviewed and negative except as per the HPI above  Physical Exam: Vitals:   06/19/21 1125  BP: (!) 172/86  Pulse: 68  Weight: 128 kg  Height: 5\' 6"  (1.676 m)   Wt Readings from Last 3 Encounters:  06/19/21 128 kg  05/30/21 128 kg  05/23/21 128.1 kg    Labs: Lab Results  Component Value Date   NA 142 06/19/2021   K 4.9 06/19/2021   CL 105 06/19/2021   CO2 29 06/19/2021   GLUCOSE 109 (H) 06/19/2021   BUN 12 06/19/2021   CREATININE 0.73 06/19/2021   CALCIUM 9.6 06/19/2021   MG 2.2 06/19/2021   Lab Results   Component Value Date   INR 1.10 02/26/2016   Lab Results  Component Value Date   CHOL 180 11/27/2020   HDL 62 11/27/2020   LDLCALC 101 (H) 11/27/2020   TRIG 91 11/27/2020     GEN- The patient is well appearing, alert and oriented x 3 today.   Head- normocephalic, atraumatic Eyes-  Sclera clear, conjunctiva pink Ears- hearing intact Oropharynx- clear Neck- supple, no JVP Lymph- no cervical lymphadenopathy Lungs- Clear to ausculation bilaterally, normal work of breathing Heart- regular rate and rhythm, no murmurs, rubs or gallops, PMI not laterally displaced GI- soft, NT, ND, + BS Extremities- no clubbing, cyanosis, trace LLE MS- no significant deformity or atrophy Skin- no rash or lesion Psych- euthymic mood, full affect Neuro- strength and sensation are intact  EKG- SR at 68 bpm,  PR int 200  ms, qrs int 78 ms, qtc 442 ms    - Left ventricle: The cavity size was normal. There was mild   concentric hypertrophy. Systolic function was normal. The   estimated ejection fraction was in the range of 60% to 65%. Wall   motion was normal; there were no regional wall motion   abnormalities. - Aortic valve: Transvalvular velocity was within the normal range.   There was no stenosis. There was no regurgitation. - Aorta: Ascending aortic diameter: 38 mm (S). - Ascending aorta: The ascending aorta was mildly dilated. - Mitral valve: Mildly calcified annulus. Transvalvular velocity   was within the normal range. There was no evidence for stenosis.   There was mild regurgitation. - Left atrium: The atrium was mildly dilated.LAD 57 mm, volume 78 ml - Right ventricle: The cavity size was normal. Wall thickness was   normal. Systolic function was normal. - Atrial septum: No defect or patent foramen ovale was identified. - Tricuspid valve: There was mild-moderate regurgitation. - Pulmonic valve: There was moderate regurgitation. - Pulmonary arteries: Systolic pressure was severely  increased. PA   peak pressure: 69 mm Hg (S).     Assessment and  Plan: 1. Persistent afib Maintaining  SR on Tikosyn 500 mcg bid qtc stable Continue xarelto 20 mg daily for CHA2DS2VASc of at least 5  Bmet/mag today   2. HTN Elevated here   Recheck at home  Amlodipine  7.5 mg daily  F/u with afib clinic in 6 months   Butch Penny C. Leahna Hewson, [facility] [address] [place], [place] [address] [phone]

## 2021-06-23 DIAGNOSIS — M170 Bilateral primary osteoarthritis of knee: Secondary | ICD-10-CM | POA: Diagnosis not present

## 2021-06-23 DIAGNOSIS — M25561 Pain in right knee: Secondary | ICD-10-CM | POA: Insufficient documentation

## 2021-06-26 DIAGNOSIS — Z86010 Personal history of colonic polyps: Secondary | ICD-10-CM | POA: Diagnosis not present

## 2021-06-26 DIAGNOSIS — D123 Benign neoplasm of transverse colon: Secondary | ICD-10-CM | POA: Diagnosis not present

## 2021-06-29 ENCOUNTER — Emergency Department (HOSPITAL_BASED_OUTPATIENT_CLINIC_OR_DEPARTMENT_OTHER): Payer: Medicare Other | Admitting: Radiology

## 2021-06-29 ENCOUNTER — Other Ambulatory Visit: Payer: Self-pay

## 2021-06-29 ENCOUNTER — Encounter (HOSPITAL_BASED_OUTPATIENT_CLINIC_OR_DEPARTMENT_OTHER): Payer: Self-pay | Admitting: Obstetrics and Gynecology

## 2021-06-29 ENCOUNTER — Emergency Department (HOSPITAL_BASED_OUTPATIENT_CLINIC_OR_DEPARTMENT_OTHER): Payer: Medicare Other

## 2021-06-29 ENCOUNTER — Inpatient Hospital Stay (HOSPITAL_BASED_OUTPATIENT_CLINIC_OR_DEPARTMENT_OTHER)
Admission: EM | Admit: 2021-06-29 | Discharge: 2021-07-01 | DRG: 919 | Disposition: A | Discharge status: Home / Self Care | Payer: Medicare Other | Attending: Internal Medicine | Admitting: Internal Medicine

## 2021-06-29 DIAGNOSIS — Z6841 Body Mass Index (BMI) 40.0 and over, adult: Secondary | ICD-10-CM | POA: Diagnosis not present

## 2021-06-29 DIAGNOSIS — R109 Unspecified abdominal pain: Secondary | ICD-10-CM

## 2021-06-29 DIAGNOSIS — K219 Gastro-esophageal reflux disease without esophagitis: Secondary | ICD-10-CM | POA: Diagnosis present

## 2021-06-29 DIAGNOSIS — K91840 Postprocedural hemorrhage and hematoma of a digestive system organ or structure following a digestive system procedure: Principal | ICD-10-CM | POA: Diagnosis present

## 2021-06-29 DIAGNOSIS — C50212 Malignant neoplasm of upper-inner quadrant of left female breast: Secondary | ICD-10-CM | POA: Diagnosis not present

## 2021-06-29 DIAGNOSIS — Z79899 Other long term (current) drug therapy: Secondary | ICD-10-CM

## 2021-06-29 DIAGNOSIS — K661 Hemoperitoneum: Principal | ICD-10-CM

## 2021-06-29 DIAGNOSIS — Z853 Personal history of malignant neoplasm of breast: Secondary | ICD-10-CM

## 2021-06-29 DIAGNOSIS — Z888 Allergy status to other drugs, medicaments and biological substances status: Secondary | ICD-10-CM

## 2021-06-29 DIAGNOSIS — Z170 Estrogen receptor positive status [ER+]: Secondary | ICD-10-CM

## 2021-06-29 DIAGNOSIS — Y838 Other surgical procedures as the cause of abnormal reaction of the patient, or of later complication, without mention of misadventure at the time of the procedure: Secondary | ICD-10-CM | POA: Diagnosis present

## 2021-06-29 DIAGNOSIS — Z8042 Family history of malignant neoplasm of prostate: Secondary | ICD-10-CM

## 2021-06-29 DIAGNOSIS — Z881 Allergy status to other antibiotic agents status: Secondary | ICD-10-CM | POA: Diagnosis not present

## 2021-06-29 DIAGNOSIS — Z8249 Family history of ischemic heart disease and other diseases of the circulatory system: Secondary | ICD-10-CM | POA: Diagnosis not present

## 2021-06-29 DIAGNOSIS — S3600XA Unspecified injury of spleen, initial encounter: Secondary | ICD-10-CM | POA: Diagnosis not present

## 2021-06-29 DIAGNOSIS — Z83438 Family history of other disorder of lipoprotein metabolism and other lipidemia: Secondary | ICD-10-CM | POA: Diagnosis not present

## 2021-06-29 DIAGNOSIS — Z79811 Long term (current) use of aromatase inhibitors: Secondary | ICD-10-CM | POA: Diagnosis not present

## 2021-06-29 DIAGNOSIS — S3609XA Other injury of spleen, initial encounter: Secondary | ICD-10-CM | POA: Diagnosis not present

## 2021-06-29 DIAGNOSIS — D6832 Hemorrhagic disorder due to extrinsic circulating anticoagulants: Secondary | ICD-10-CM | POA: Diagnosis not present

## 2021-06-29 DIAGNOSIS — I517 Cardiomegaly: Secondary | ICD-10-CM | POA: Diagnosis not present

## 2021-06-29 DIAGNOSIS — Y733 Surgical instruments, materials and gastroenterology and urology devices (including sutures) associated with adverse incidents: Secondary | ICD-10-CM | POA: Diagnosis present

## 2021-06-29 DIAGNOSIS — D735 Infarction of spleen: Secondary | ICD-10-CM | POA: Diagnosis not present

## 2021-06-29 DIAGNOSIS — Z8673 Personal history of transient ischemic attack (TIA), and cerebral infarction without residual deficits: Secondary | ICD-10-CM | POA: Diagnosis not present

## 2021-06-29 DIAGNOSIS — Z885 Allergy status to narcotic agent status: Secondary | ICD-10-CM

## 2021-06-29 DIAGNOSIS — D123 Benign neoplasm of transverse colon: Secondary | ICD-10-CM | POA: Diagnosis not present

## 2021-06-29 DIAGNOSIS — R1012 Left upper quadrant pain: Secondary | ICD-10-CM | POA: Diagnosis not present

## 2021-06-29 DIAGNOSIS — I10 Essential (primary) hypertension: Secondary | ICD-10-CM | POA: Diagnosis present

## 2021-06-29 DIAGNOSIS — I700 Atherosclerosis of aorta: Secondary | ICD-10-CM | POA: Diagnosis not present

## 2021-06-29 DIAGNOSIS — Z823 Family history of stroke: Secondary | ICD-10-CM | POA: Diagnosis not present

## 2021-06-29 DIAGNOSIS — I480 Paroxysmal atrial fibrillation: Secondary | ICD-10-CM | POA: Diagnosis not present

## 2021-06-29 DIAGNOSIS — Z7901 Long term (current) use of anticoagulants: Secondary | ICD-10-CM | POA: Diagnosis not present

## 2021-06-29 DIAGNOSIS — I4819 Other persistent atrial fibrillation: Secondary | ICD-10-CM | POA: Diagnosis present

## 2021-06-29 DIAGNOSIS — Z20822 Contact with and (suspected) exposure to covid-19: Secondary | ICD-10-CM | POA: Diagnosis present

## 2021-06-29 DIAGNOSIS — S3600XD Unspecified injury of spleen, subsequent encounter: Secondary | ICD-10-CM | POA: Diagnosis not present

## 2021-06-29 DIAGNOSIS — E876 Hypokalemia: Secondary | ICD-10-CM | POA: Diagnosis not present

## 2021-06-29 DIAGNOSIS — E785 Hyperlipidemia, unspecified: Secondary | ICD-10-CM | POA: Diagnosis not present

## 2021-06-29 LAB — CBC WITH DIFFERENTIAL/PLATELET
Abs Immature Granulocytes: 0.02 10*3/uL (ref 0.00–0.07)
Basophils Absolute: 0.1 10*3/uL (ref 0.0–0.1)
Basophils Relative: 1 %
Eosinophils Absolute: 0.1 10*3/uL (ref 0.0–0.5)
Eosinophils Relative: 1 %
HCT: 38.8 % (ref 36.0–46.0)
Hemoglobin: 12.3 g/dL (ref 12.0–15.0)
Immature Granulocytes: 0 %
Lymphocytes Relative: 15 %
Lymphs Abs: 1 10*3/uL (ref 0.7–4.0)
MCH: 28.5 pg (ref 26.0–34.0)
MCHC: 31.7 g/dL (ref 30.0–36.0)
MCV: 90 fL (ref 80.0–100.0)
Monocytes Absolute: 0.8 10*3/uL (ref 0.1–1.0)
Monocytes Relative: 12 %
Neutro Abs: 5 10*3/uL (ref 1.7–7.7)
Neutrophils Relative %: 71 %
Platelets: 238 10*3/uL (ref 150–400)
RBC: 4.31 MIL/uL (ref 3.87–5.11)
RDW: 13.1 % (ref 11.5–15.5)
WBC: 6.9 10*3/uL (ref 4.0–10.5)
nRBC: 0 % (ref 0.0–0.2)

## 2021-06-29 LAB — URINALYSIS, ROUTINE W REFLEX MICROSCOPIC
Bilirubin Urine: NEGATIVE
Glucose, UA: NEGATIVE mg/dL
Hgb urine dipstick: NEGATIVE
Ketones, ur: NEGATIVE mg/dL
Nitrite: NEGATIVE
Protein, ur: NEGATIVE mg/dL
Specific Gravity, Urine: 1.007 (ref 1.005–1.030)
pH: 7 (ref 5.0–8.0)

## 2021-06-29 LAB — HEPATIC FUNCTION PANEL
ALT: 13 U/L (ref 0–44)
AST: 19 U/L (ref 15–41)
Albumin: 3.9 g/dL (ref 3.5–5.0)
Alkaline Phosphatase: 72 U/L (ref 38–126)
Bilirubin, Direct: 0.3 mg/dL — ABNORMAL HIGH (ref 0.0–0.2)
Indirect Bilirubin: 1.2 mg/dL — ABNORMAL HIGH (ref 0.3–0.9)
Total Bilirubin: 1.5 mg/dL — ABNORMAL HIGH (ref 0.3–1.2)
Total Protein: 6.8 g/dL (ref 6.5–8.1)

## 2021-06-29 LAB — CBC
HCT: 36.6 % (ref 36.0–46.0)
Hemoglobin: 11.7 g/dL — ABNORMAL LOW (ref 12.0–15.0)
MCH: 28.7 pg (ref 26.0–34.0)
MCHC: 32 g/dL (ref 30.0–36.0)
MCV: 89.9 fL (ref 80.0–100.0)
Platelets: 210 10*3/uL (ref 150–400)
RBC: 4.07 MIL/uL (ref 3.87–5.11)
RDW: 13.2 % (ref 11.5–15.5)
WBC: 6.4 10*3/uL (ref 4.0–10.5)
nRBC: 0 % (ref 0.0–0.2)

## 2021-06-29 LAB — BASIC METABOLIC PANEL
Anion gap: 9 (ref 5–15)
BUN: 8 mg/dL (ref 8–23)
CO2: 26 mmol/L (ref 22–32)
Calcium: 9.1 mg/dL (ref 8.9–10.3)
Chloride: 103 mmol/L (ref 98–111)
Creatinine, Ser: 0.66 mg/dL (ref 0.44–1.00)
GFR, Estimated: >60 mL/min (ref >60)
Glucose, Bld: 108 mg/dL — ABNORMAL HIGH (ref 70–99)
Potassium: 4.5 mmol/L (ref 3.5–5.1)
Sodium: 138 mmol/L (ref 135–145)

## 2021-06-29 LAB — TROPONIN I (HIGH SENSITIVITY): Troponin I (High Sensitivity): 5 ng/L (ref <18)

## 2021-06-29 LAB — PROTIME-INR
INR: 1.7 — ABNORMAL HIGH (ref 0.8–1.2)
Prothrombin Time: 19.6 seconds — ABNORMAL HIGH (ref 11.4–15.2)

## 2021-06-29 LAB — RESP PANEL BY RT-PCR (FLU A&B, COVID) ARPGX2
Influenza A by PCR: NEGATIVE
Influenza B by PCR: NEGATIVE
SARS Coronavirus 2 by RT PCR: NEGATIVE

## 2021-06-29 LAB — LIPASE, BLOOD: Lipase: 11 U/L (ref 11–51)

## 2021-06-29 MED ORDER — DOFETILIDE 500 MCG PO CAPS
500.0000 ug | ORAL_CAPSULE | Freq: Two times a day (BID) | ORAL | Status: DC
  Administered 2021-06-29 – 2021-07-01 (×4): 500 ug via ORAL
  Filled 2021-06-29 (×6): qty 1

## 2021-06-29 MED ORDER — AMLODIPINE BESYLATE 5 MG PO TABS
5.0000 mg | ORAL_TABLET | Freq: Every day | ORAL | Status: DC
  Administered 2021-06-30 – 2021-07-01 (×2): 5 mg via ORAL
  Filled 2021-06-29 (×2): qty 1

## 2021-06-29 MED ORDER — DULOXETINE HCL 30 MG PO CPEP
30.0000 mg | ORAL_CAPSULE | Freq: Every day | ORAL | Status: DC
  Administered 2021-06-29: 30 mg via ORAL
  Filled 2021-06-29 (×2): qty 1

## 2021-06-29 MED ORDER — SODIUM CHLORIDE 0.9 % IV SOLN
75.0000 mL/h | INTRAVENOUS | Status: AC
  Administered 2021-06-29: 75 mL/h via INTRAVENOUS

## 2021-06-29 MED ORDER — ANASTROZOLE 1 MG PO TABS
1.0000 mg | ORAL_TABLET | Freq: Every day | ORAL | Status: DC
  Administered 2021-06-30 – 2021-07-01 (×2): 1 mg via ORAL
  Filled 2021-06-29 (×3): qty 1

## 2021-06-29 MED ORDER — ACETAMINOPHEN 325 MG PO TABS: 650.0000 mg | ORAL_TABLET | Freq: Four times a day (QID) | ORAL | Status: DC | PRN

## 2021-06-29 MED ORDER — EZETIMIBE 10 MG PO TABS
10.0000 mg | ORAL_TABLET | Freq: Every day | ORAL | Status: DC
  Administered 2021-06-30 – 2021-07-01 (×2): 10 mg via ORAL
  Filled 2021-06-29 (×2): qty 1

## 2021-06-29 MED ORDER — ACETAMINOPHEN 650 MG RE SUPP: 650.0000 mg | Freq: Four times a day (QID) | RECTAL | Status: DC | PRN

## 2021-06-29 MED ORDER — IOHEXOL 350 MG/ML SOLN
75.0000 mL | Freq: Once | INTRAVENOUS | Status: AC | PRN
  Administered 2021-06-29: 75 mL via INTRAVENOUS

## 2021-06-29 NOTE — Consult Note (Signed)
Reason for Consult:splenic injury Referring Provider: Gerald Stabs Tegeler  [name] Collier is an 75 y.o. female.  HPI: 75 yo female underwent colonoscopy 3 days ago (Thursday). She takes Xarelto at night for a fib. She took it Tuesday, skipped Wednesday, had the procedure and then took it again on Thursday night and then daily. She developed new left sided chest and abdominal pain. Pain was constant. It was worse with deep breathing. It did not get better with time. She called her GI doc who recommended evaluation. At Urgent care she was diagnosed with spleen injury with some blood in the paracolic gutter and pelvis.  Past Medical History:  Diagnosis Date   Anxiety    Atrial fibrillation ([place])    Cancer ([place]) 11/2020   left breast IMC   GERD (gastroesophageal reflux disease)    HTN (hypertension)    Hyperlipidemia    TIA (transient ischemic attack)     Past Surgical History:  Procedure Laterality Date   BOTOX INJECTION N/A 12/22/2018   Procedure: BOTOX INJECTION INTO ANAL SPHINCTER;  Surgeon: Ileana Roup, MD;  Location: [facility];  Service: General;  Laterality: N/A;   BREAST BIOPSY Left 11/26/2020   BREAST LUMPECTOMY WITH RADIOACTIVE SEED AND SENTINEL LYMPH NODE BIOPSY Left 01/07/2021   Procedure: LEFT BREAST LUMPECTOMY WITH RADIOACTIVE SEED AND LEFT AXILLARY SENTINEL LYMPH NODE BIOPSY WITH BLUE DYE INJECTION;  Surgeon: Donnie Mesa, MD;  Location: [facility];  Service: General;  Laterality: Left;  LMA WITH PECTORAL BLOCK, BLUE DYE INJECTION   CARDIOVERSION N/A 01/04/2018   Procedure: CARDIOVERSION;  Surgeon: Dorothy Spark, MD;  Location: [facility];  Service: Cardiovascular;  Laterality: N/A;   CARDIOVERSION N/A 09/20/2018   Procedure: CARDIOVERSION;  Surgeon: Sueanne Margarita, MD;  Location: [facility];  Service: Cardiovascular;  Laterality: N/A;   CARDIOVERSION N/A 10/27/2018   Procedure: CARDIOVERSION;  Surgeon: Buford Dresser, MD;  Location: [facility]  ENDOSCOPY;  Service: Cardiovascular;  Laterality: N/A;   KNEE SURGERY Bilateral    Torn miniscus   RECTAL EXAM UNDER ANESTHESIA N/A 12/22/2018   Procedure: ANORECTAL  EXAM UNDER ANESTHESIA;  Surgeon: Ileana Roup, MD;  Location: [facility];  Service: General;  Laterality: N/A;   RECTOCELE REPAIR     TONSILLECTOMY     VAGINAL HYSTERECTOMY      Family History  Problem Relation Age of Onset   Hypertension Mother    Hyperlipidemia Mother    Neurologic Disorder Mother 59       GB   Stroke Son    Stroke Maternal Uncle    Stroke Grandchild    Prostate cancer Brother     Social History:  reports that she has never smoked. She has never used smokeless tobacco. She reports that she does not drink alcohol and does not use drugs.  Allergies:  Allergies  Allergen Reactions   Augmentin [Amoxicillin-Pot Clavulanate] Other (See Comments)    c-diff Has patient had a PCN reaction causing immediate rash, facial/tongue/throat swelling, SOB or lightheadedness with hypotension: No Has patient had a PCN reaction causing severe rash involving mucus membranes or skin necrosis: No Has patient had a PCN reaction that required hospitalization: No Has patient had a PCN reaction occurring within the last 10 years: Yes If all of the above answers are "NO", then may proceed with Cephalosporin use.    Codeine Palpitations and Rash   Prednisone Other (See Comments)    Jittery, red in the face    Medications: I have  reviewed the patient's current medications.  Results for orders placed or performed during the hospital encounter of 06/29/21 (from the past 48 hour(s))  CBC with Differential     Status: None   Collection Time: 06/29/21 11:15 AM  Result Value Ref Range   WBC 6.9 4.0 - 10.5 K/uL   RBC 4.31 3.87 - 5.11 MIL/uL   Hemoglobin 12.3 12.0 - 15.0 g/dL   HCT 38.8 36.0 - 46.0 %   MCV 90.0 80.0 - 100.0 fL   MCH 28.5 26.0 - 34.0 pg   MCHC 31.7 30.0 - 36.0 g/dL   RDW 13.1 11.5 - 15.5 %    Platelets 238 150 - 400 K/uL   nRBC 0.0 0.0 - 0.2 %   Neutrophils Relative % 71 %   Neutro Abs 5.0 1.7 - 7.7 K/uL   Lymphocytes Relative 15 %   Lymphs Abs 1.0 0.7 - 4.0 K/uL   Monocytes Relative 12 %   Monocytes Absolute 0.8 0.1 - 1.0 K/uL   Eosinophils Relative 1 %   Eosinophils Absolute 0.1 0.0 - 0.5 K/uL   Basophils Relative 1 %   Basophils Absolute 0.1 0.0 - 0.1 K/uL   Immature Granulocytes 0 %   Abs Immature Granulocytes 0.02 0.00 - 0.07 K/uL    Comment: Performed at [facility], [address], [place], [place] [id number]  Basic metabolic panel     Status: Abnormal   Collection Time: 06/29/21 11:15 AM  Result Value Ref Range   Sodium 138 135 - 145 mmol/L   Potassium 4.5 3.5 - 5.1 mmol/L   Chloride 103 98 - 111 mmol/L   CO2 26 22 - 32 mmol/L   Glucose, Bld 108 (H) 70 - 99 mg/dL    Comment: Glucose reference range applies only to samples taken after fasting for at least 8 hours.   BUN 8 8 - 23 mg/dL   Creatinine, Ser 0.66 0.44 - 1.00 mg/dL   Calcium 9.1 8.9 - 10.3 mg/dL   GFR, Estimated >60 >60 mL/min    Comment: (NOTE) Calculated using the CKD-EPI Creatinine Equation (2021)    Anion gap 9 5 - 15    Comment: Performed at [facility], [address], [place], [place] [address]  Protime-INR     Status: Abnormal   Collection Time: 06/29/21 11:15 AM  Result Value Ref Range   Prothrombin Time 19.6 (H) 11.4 - 15.2 seconds    Comment: SLIGHT HEMOLYSIS   INR 1.7 (H) 0.8 - 1.2    Comment: (NOTE) INR goal varies based on device and disease states. Performed at [facility], [place] [address], [place], [place] [address]   Hepatic function panel     Status: Abnormal   Collection Time: 06/29/21 11:23 AM  Result Value Ref Range   Total Protein 6.8 6.5 - 8.1 g/dL   Albumin 3.9 3.5 - 5.0 g/dL   AST 19 15 - 41 U/L   ALT 13 0 - 44 U/L   Alkaline Phosphatase 72 38 - 126 U/L   Total Bilirubin 1.5 (H) 0.3 - 1.2 mg/dL   Bilirubin, Direct  0.3 (H) 0.0 - 0.2 mg/dL   Indirect Bilirubin 1.2 (H) 0.3 - 0.9 mg/dL    Comment: Performed at [facility], [place], [place] [address]  Lipase, blood     Status: None   Collection Time: 06/29/21 11:23 AM  Result Value Ref Range   Lipase 11 11 - 51 U/L  Comment: Performed at [facility], [place], [place] [address]  Troponin I (High Sensitivity)     Status: None   Collection Time: 06/29/21 11:23 AM  Result Value Ref Range   Troponin I (High Sensitivity) 5 <18 ng/L    Comment: (NOTE) Elevated high sensitivity troponin I (hsTnI) values and significant  changes across serial measurements may suggest ACS but many other  chronic and acute conditions are known to elevate hsTnI results.  Refer to the "Links" section for chest pain algorithms and additional  guidance. Performed at [facility], [address], [place], [place] [address]   Urinalysis, Routine w reflex microscopic Urine, Clean Catch     Status: Abnormal   Collection Time: 06/29/21 12:50 PM  Result Value Ref Range   Color, Urine YELLOW YELLOW   APPearance CLEAR CLEAR   Specific Gravity, Urine 1.007 1.005 - 1.030   pH 7.0 5.0 - 8.0   Glucose, UA NEGATIVE NEGATIVE mg/dL   Hgb urine dipstick NEGATIVE NEGATIVE   Bilirubin Urine NEGATIVE NEGATIVE   Ketones, ur NEGATIVE NEGATIVE mg/dL   Protein, ur NEGATIVE NEGATIVE mg/dL   Nitrite NEGATIVE NEGATIVE   Leukocytes,Ua TRACE (A) NEGATIVE   RBC / HPF 0-5 0 - 5 RBC/hpf   WBC, UA 0-5 0 - 5 WBC/hpf   Squamous Epithelial / LPF 6-10 0 - 5   Mucus PRESENT     Comment: Performed at [facility], [address] [place], [place] [address]  Resp Panel by RT-PCR (Flu A&B, Covid) Nasopharyngeal Swab     Status: None   Collection Time: 06/29/21  1:00 PM   Specimen: Nasopharyngeal Swab; Nasopharyngeal(NP) swabs in vial transport medium  Result Value Ref Range   SARS Coronavirus 2  by RT PCR NEGATIVE NEGATIVE    Comment: (NOTE) SARS-CoV-2 target nucleic acids are NOT DETECTED.  The SARS-CoV-2 RNA is generally detectable in upper respiratory specimens during the acute phase of infection. The lowest concentration of SARS-CoV-2 viral copies this assay can detect is 138 copies/mL. A negative result does not preclude SARS-Cov-2 infection and should not be used as the sole basis for treatment or other patient management decisions. A negative result may occur with  improper specimen collection/handling, submission of specimen other than nasopharyngeal swab, presence of viral mutation(s) within the areas targeted by this assay, and inadequate number of viral copies(<138 copies/mL). A negative result must be combined with clinical observations, patient history, and epidemiological information. The expected result is Negative.  Fact Sheet for Patients:  EntrepreneurPulse.com.au  Fact Sheet for Healthcare Providers:  IncredibleEmployment.be  This test is no t yet approved or cleared by the [place] FDA and  has been authorized for detection and/or diagnosis of SARS-CoV-2 by FDA under an Emergency Use Authorization (EUA). This EUA will remain  in effect (meaning this test can be used) for the duration of the COVID-19 declaration under Section 564(b)(1) of the Act, 21 U.S.C.section 360bbb-3(b)(1), unless the authorization is terminated  or revoked sooner.       Influenza A by PCR NEGATIVE NEGATIVE   Influenza B by PCR NEGATIVE NEGATIVE    Comment: (NOTE) The Xpert Xpress SARS-CoV-2/FLU/RSV plus assay is intended as an aid in the diagnosis of influenza from Nasopharyngeal swab specimens and should not be used as a sole basis for treatment. Nasal washings and aspirates are unacceptable for Xpert Xpress SARS-CoV-2/FLU/RSV testing.  Fact Sheet for Patients: EntrepreneurPulse.com.au  Fact Sheet for Healthcare  Providers: IncredibleEmployment.be  This test  is not yet approved or cleared by the [place] and has been authorized for detection and/or diagnosis of SARS-CoV-2 by FDA under an Emergency Use Authorization (EUA). This EUA will remain in effect (meaning this test can be used) for the duration of the COVID-19 declaration under Section 564(b)(1) of the Act, 21 U.S.C. section 360bbb-3(b)(1), unless the authorization is terminated or revoked.  Performed at [facility], [address], [place], [place] [address]   CBC     Status: Abnormal   Collection Time: 06/29/21  4:45 PM  Result Value Ref Range   WBC 6.4 4.0 - 10.5 K/uL   RBC 4.07 3.87 - 5.11 MIL/uL   Hemoglobin 11.7 (L) 12.0 - 15.0 g/dL   HCT 36.6 36.0 - 46.0 %   MCV 89.9 80.0 - 100.0 fL   MCH 28.7 26.0 - 34.0 pg   MCHC 32.0 30.0 - 36.0 g/dL   RDW 13.2 11.5 - 15.5 %   Platelets 210 150 - 400 K/uL   nRBC 0.0 0.0 - 0.2 %    Comment: Performed at [facility], [address] [place], [place] [address]    DG Chest 2 View  Result Date: 06/29/2021 CLINICAL DATA:  Left upper quadrant abdominal pain and pleuritic pain. Recent colonoscopy. EXAM: CHEST - 2 VIEW COMPARISON:  Chest radiograph 12/16/2016 FINDINGS: Atherosclerotic calcification of the aortic arch. Mild cardiomegaly. Lingular density favoring scarring or atelectasis. No blunting of the costophrenic angles. No obvious indicators of free intraperitoneal gas beneath the hemidiaphragms. Thoracic spondylosis. IMPRESSION: 1. No findings of free intraperitoneal gas beneath the hemidiaphragms. 2. Indistinct lingular density probably from scarring, although a component of atelectasis is not excluded. 3.  Aortic Atherosclerosis (ICD10-I70.0). 4. Mild enlargement of the cardiopericardial silhouette, without edema. Electronically Signed   By: Van Clines M.D.   On: 06/29/2021 12:08   CT ABDOMEN PELVIS W  CONTRAST  Result Date: 06/29/2021 CLINICAL DATA:  Left-sided abdominal pain. Recent colonoscopy with polypectomy. EXAM: CT ABDOMEN AND PELVIS WITH CONTRAST TECHNIQUE: Multidetector CT imaging of the abdomen and pelvis was performed using the standard protocol following bolus administration of intravenous contrast. CONTRAST:  55m OMNIPAQUE IOHEXOL 350 MG/ML SOLN COMPARISON:  05/06/2021 FINDINGS: Lower chest: Stable scarring in the lingula. Stable (from 05/06/2021) 5 mm left lower lobe nodule on image 2 series 4. descending thoracic aortic atherosclerotic vascular calcification. Mild mitral valve calcification. Hepatobiliary: Unremarkable Pancreas: Unremarkable Spleen: Complex fluid along the left paracolic gutter tangential to the anterior margin of the spleen but also tangential to the proximal descending colon. No hypoenhancing segment of the spleen to suggest splenic infarct. No sharply defined splenic laceration. Adrenals/Urinary Tract: Unremarkable, prior right perinephric stranding has resolved. Stomach/Bowel: There is abnormal complex fluid in the left paracolic gutter adjacent to the proximal descending colon, volume at this location estimated at about 15 cc. There is also a small amount of complex fluid favoring blood products in the pelvis, volume approximately 30 cc. Currently there is no free intraperitoneal gas or definite extraluminal gas or pneumatosis identified. No dilated bowel Vascular/Lymphatic: Aortoiliac atherosclerotic vascular disease. A left periaortic lymph node on image 35 of series 2 is borderline prominent at 1.0 cm in short axis. Low-density mesenteric lymph node on image 39 series 2 measures 1.0 cm in short axis, borderline prominent as well. Reproductive: Uterus absent.  Adnexa unremarkable. Other: No supplemental non-categorized findings. Musculoskeletal: Mild sclerosis along the SI joints suggesting osteitis pubis. Lumbar spondylosis and degenerative disc disease causing mild  degrees  of impingement at L3-4, L4-5, and L5-S1. IMPRESSION: 1. There is about 15 cc of hemoperitoneum interposed between the anterior spleen and the proximal descending colon, and about 30 cc of hemoperitoneum in the cul-de-sac, in this patient with recent colonoscopy. No free intraperitoneal gas or extraluminal gas. Possibilities in this clinical scenario include either a bowel injury leading to intraperitoneal bleeding but without extraluminal gas; or a small anterior splenic laceration/injury which can be caused by traction on the splenocolic ligament. No compelling findings of active extravasation. Close clinical surveillance and careful management of the patient's anticoagulation in the context of this injury is recommended. 2. Borderline enlarged single retroperitoneal and single mesenteric lymph nodes. 3. Stable 5 mm left lower lobe nodule. No follow-up needed if patient is low-risk. Non-contrast chest CT can be considered in 12 months if patient is high-risk. This recommendation follows the consensus statement: Guidelines for Management of Incidental Pulmonary Nodules Detected on CT Images: From the Fleischner Society 2017; Radiology 2017; 284:228-243. 4. Aortic Atherosclerosis (ICD10-I70.0). Mild mitral valve calcification. 5. Mild lower lumbar impingement. I discussed these findings by telephone with Dr. Sherry Ruffing at approximately 2:45 p.m. on 06/29/2021. Electronically Signed   By: Van Clines M.D.   On: 06/29/2021 14:48    Review of Systems  Constitutional:  Negative for chills and fever.  HENT:  Negative for hearing loss.   Eyes:  Negative for blurred vision and double vision.  Respiratory:  Negative for cough and hemoptysis.   Cardiovascular:  Negative for chest pain and palpitations.  Gastrointestinal:  Positive for abdominal pain. Negative for nausea and vomiting.  Genitourinary:  Negative for dysuria and urgency.  Musculoskeletal:  Negative for myalgias and neck pain.  Skin:   Negative for itching and rash.  Neurological:  Negative for dizziness, tingling and headaches.  Endo/Heme/Allergies:  Does not bruise/bleed easily.  Psychiatric/Behavioral:  Negative for depression and suicidal ideas.    PE Blood pressure (!) 154/76, pulse 73, temperature 98.5 F (36.9 C), temperature source Oral, resp. rate 20, height '5\' 6"'$  (1.676 m), weight 126.6 kg, SpO2 94 %. Constitutional: NAD; conversant; no deformities Eyes: Moist conjunctiva; no lid lag; anicteric; PERRL Neck: Trachea midline; no thyromegaly Lungs: Normal respiratory effort; no tactile fremitus CV: RRR; no palpable thrills; no pitting edema GI: Abd soft, LUQ tender to palpation, no guarding; no palpable hepatosplenomegaly MSK: Normal gait; no clubbing/cyanosis Psychiatric: Appropriate affect; alert and oriented x3 Lymphatic: No palpable cervical or axillary lymphadenopathy Skin: No major subcutaneous nodules. Warm and dry   Assessment/Plan: 75 yo female 3 days out from colonoscopy with a 36 h hold of Xarelto who has blood around her spleen. Vitals normal. No LH or fatigue -hold xarelto and other blood thinning products -repeat H+H in am -discussed with patient that if H+H drops, may involve IR for selective embolization and if she has symptoms or bleeding worsens, may require surgery  [name] Collier 06/29/2021, 6:28 PM

## 2021-06-29 NOTE — ED Notes (Signed)
I have just given report to Wannetta Sender, Therapist, sports at [facility].

## 2021-06-29 NOTE — ED Triage Notes (Signed)
Patient reports she had a colonoscopy Thursday and reports that she feels left side pain that has continued to worsen since. Patient reports she had x2 polyps removed. Patient states denies blood in stool and reports x2 BM's since the procedure. Patient reports pain with deep breath

## 2021-06-29 NOTE — Progress Notes (Signed)
The patient was transferred from [place] via [place], She is alert and oriented and ambulates independantly. VS are WNL. She reports 8/10 left flank pain when she moves but 0/10 when still. Skin is intact. Patient on telemetry monitor. CHG bath complete. She is oriented to the unit and is currently resting comfortably. Fuller Canada, RN

## 2021-06-29 NOTE — ED Provider Notes (Signed)
[facility] Provider Note   CSN: [id number] Arrival date & time: 06/29/21  1044     History No chief complaint on file.   Prim RALISHA INVERSO is a 75 y.o. female.  The history is provided by the patient and medical records. No language interpreter was used.  Abdominal Pain Pain location:  LUQ and L flank Pain quality: aching and sharp   Pain radiates to:  Does not radiate Pain severity:  Severe Onset quality:  Gradual Duration:  4 days Timing:  Constant Progression:  Waxing and waning Chronicity:  New Context comment:  Recent colonoscopy Worsened by:  Palpation Ineffective treatments:  None tried Associated symptoms: no chest pain, no chills, no constipation, no cough, no diarrhea, no dysuria, no fatigue, no fever, no hematuria, no nausea and no shortness of breath         Past Medical History:  Diagnosis Date   Anxiety    Atrial fibrillation (HCC)    Cancer ([place]) 11/2020   left breast IMC   GERD (gastroesophageal reflux disease)    HTN (hypertension)    Hyperlipidemia    TIA (transient ischemic attack)     Patient Active Problem List   Diagnosis Date Noted   Carcinoma of upper-inner quadrant of left breast in female, estrogen receptor positive ([place]) 12/20/2020   Trigger thumb of left hand 06/28/2019   Depression, major, single episode, mild (HCC) 03/14/2019   Generalized anxiety disorder 03/14/2019   PAF (paroxysmal atrial fibrillation) (HCC)    Osteoarthritis of knee 04/20/2018   Post-menopausal 02/22/2018   Snoring 01/12/2018   Hypokalemia 01/12/2018   Encounter for cardioversion procedure    Persistent atrial fibrillation 11/11/2017   SOB (shortness of breath) 11/11/2017   Other fatigue 11/11/2017   Hyperlipemia 04/16/2017   Weakness of left hand 05/11/2016   Anxiety related tremor 05/11/2016   Essential hypertension    Normochromic normocytic anemia    TIA (transient ischemic attack) 02/26/2016    Past Surgical History:   Procedure Laterality Date   BOTOX INJECTION N/A 12/22/2018   Procedure: BOTOX INJECTION INTO ANAL SPHINCTER;  Surgeon: Ileana Roup, MD;  Location: [facility];  Service: General;  Laterality: N/A;   BREAST BIOPSY Left 11/26/2020   BREAST LUMPECTOMY WITH RADIOACTIVE SEED AND SENTINEL LYMPH NODE BIOPSY Left 01/07/2021   Procedure: LEFT BREAST LUMPECTOMY WITH RADIOACTIVE SEED AND LEFT AXILLARY SENTINEL LYMPH NODE BIOPSY WITH BLUE DYE INJECTION;  Surgeon: Donnie Mesa, MD;  Location: [facility];  Service: General;  Laterality: Left;  LMA WITH PECTORAL BLOCK, BLUE DYE INJECTION   CARDIOVERSION N/A 01/04/2018   Procedure: CARDIOVERSION;  Surgeon: Dorothy Spark, MD;  Location: [facility];  Service: Cardiovascular;  Laterality: N/A;   CARDIOVERSION N/A 09/20/2018   Procedure: CARDIOVERSION;  Surgeon: Sueanne Margarita, MD;  Location: [facility];  Service: Cardiovascular;  Laterality: N/A;   CARDIOVERSION N/A 10/27/2018   Procedure: CARDIOVERSION;  Surgeon: Buford Dresser, MD;  Location: [facility];  Service: Cardiovascular;  Laterality: N/A;   KNEE SURGERY Bilateral    Torn miniscus   RECTAL EXAM UNDER ANESTHESIA N/A 12/22/2018   Procedure: ANORECTAL  EXAM UNDER ANESTHESIA;  Surgeon: Ileana Roup, MD;  Location: [facility];  Service: General;  Laterality: N/A;   RECTOCELE REPAIR     TONSILLECTOMY     VAGINAL HYSTERECTOMY       OB History   No obstetric history on file.     Family History  Problem Relation Age of Onset  Hypertension Mother    Hyperlipidemia Mother    Neurologic Disorder Mother 77       GB   Stroke Son    Stroke Maternal Uncle    Stroke Grandchild    Prostate cancer Brother     Social History   Tobacco Use   Smoking status: Never   Smokeless tobacco: Never  Vaping Use   Vaping Use: Never used  Substance Use Topics   Alcohol use: No    Alcohol/week: 0.0 standard drinks   Drug use: No    Home Medications Prior to  Admission medications   Medication Sig Start Date End Date Taking? Authorizing Provider  acetaminophen (TYLENOL) 500 MG tablet Take 500 mg by mouth every 6 (six) hours as needed for moderate pain or headache.    [provider]  amLODipine (NORVASC) 5 MG tablet TAKE 1 AND 1/2 TABLETS BY  MOUTH DAILY Patient taking differently: Take 5 mg by mouth daily. 04/30/21   Loman Brooklyn, FNP  anastrozole (ARIMIDEX) 1 MG tablet Take 1 tablet (1 mg total) by mouth daily. 02/04/21   [name], MD  calcium gluconate 500 MG tablet Take 1 tablet by mouth 3 (three) times daily.    [provider]  Cholecalciferol (VITAMIN D3) 5000 units CAPS Take 5,000 Units by mouth at bedtime.    [provider]  dofetilide (TIKOSYN) 500 MCG capsule TAKE 1 CAPSULE BY MOUTH 2 TIMES DAILY. 06/10/21   Sherran Needs, NP  DULoxetine (CYMBALTA) 30 MG capsule TAKE 1 CAPSULE BY MOUTH  DAILY 02/04/21   Ronnie Doss M, DO  ezetimibe (ZETIA) 10 MG tablet TAKE 1 TABLET BY MOUTH  DAILY 05/06/21   Ronnie Doss M, DO  famotidine (PEPCID) 20 MG tablet TAKE 1 TABLET BY MOUTH  TWICE DAILY 12/12/20   Ronnie Doss M, DO  furosemide (LASIX) 20 MG tablet TAKE 1 TABLET BY MOUTH  DAILY 05/06/21   Ronnie Doss M, DO  olmesartan (BENICAR) 5 MG tablet TAKE 1 TABLET BY MOUTH  DAILY 05/06/21   Ronnie Doss M, DO  polyethylene glycol-electrolytes (NULYTELY) 420 g solution See admin instructions. Patient not taking: Reported on 06/19/2021 06/05/21   [provider]  potassium chloride SA (KLOR-CON) 20 MEQ tablet TAKE 1 TABLET BY MOUTH  TWICE DAILY 05/06/21   Ronnie Doss M, DO  XARELTO 20 MG TABS tablet TAKE 1 TABLET BY MOUTH DAILY WITH SUPPER. 05/12/21   Sherran Needs, NP    Allergies    Augmentin [amoxicillin-pot clavulanate], Codeine, and Prednisone  Review of Systems   Review of Systems  Constitutional:  Negative for chills, fatigue and fever.  HENT:  Negative for congestion.    Respiratory:  Negative for cough, chest tightness, shortness of breath and wheezing.   Cardiovascular:  Negative for chest pain, palpitations and leg swelling.  Gastrointestinal:  Positive for abdominal pain. Negative for blood in stool, constipation, diarrhea and nausea.  Genitourinary:  Positive for flank pain. Negative for difficulty urinating, dysuria, frequency and hematuria.  Musculoskeletal:  Negative for back pain.  Neurological:  Negative for headaches.  Psychiatric/Behavioral:  Negative for agitation.   All other systems reviewed and are negative.  Physical Exam Updated Vital Signs BP (!) 190/73 (BP Location: Right Arm)   Pulse 72   Temp 98.8 F (37.1 C)   Resp (!) 24   Ht '5\' 6"'$  (1.676 m)   Wt 126.6 kg   SpO2 94%   BMI 45.03 kg/m   Physical Exam Vitals  and nursing note reviewed.  Constitutional:      General: She is not in acute distress.    Appearance: She is well-developed. She is not ill-appearing, toxic-appearing or diaphoretic.  HENT:     Head: Normocephalic and atraumatic.     Mouth/Throat:     Mouth: Mucous membranes are moist.  Eyes:     Conjunctiva/sclera: Conjunctivae normal.  Cardiovascular:     Rate and Rhythm: Normal rate and regular rhythm.     Heart sounds: No murmur heard. Pulmonary:     Effort: Pulmonary effort is normal. No respiratory distress.     Breath sounds: Normal breath sounds. No wheezing, rhonchi or rales.  Chest:     Chest wall: No tenderness.  Abdominal:     General: Abdomen is flat.     Palpations: Abdomen is soft.     Tenderness: There is abdominal tenderness in the left upper quadrant. There is no right CVA tenderness, left CVA tenderness, guarding or rebound.    Musculoskeletal:        General: No tenderness.     Cervical back: Neck supple.  Skin:    General: Skin is warm and dry.     Findings: No erythema.  Neurological:     General: No focal deficit present.     Mental Status: She is alert.    ED Results /  Procedures / Treatments   Labs (all labs ordered are listed, but only abnormal results are displayed) Labs Reviewed  BASIC METABOLIC PANEL - Abnormal; Notable for the following components:      Result Value   Glucose, Bld 108 (*)    All other components within normal limits  PROTIME-INR - Abnormal; Notable for the following components:   Prothrombin Time 19.6 (*)    INR 1.7 (*)    All other components within normal limits  HEPATIC FUNCTION PANEL - Abnormal; Notable for the following components:   Total Bilirubin 1.5 (*)    Bilirubin, Direct 0.3 (*)    Indirect Bilirubin 1.2 (*)    All other components within normal limits  URINALYSIS, ROUTINE W REFLEX MICROSCOPIC - Abnormal; Notable for the following components:   Leukocytes,Ua TRACE (*)    All other components within normal limits  RESP PANEL BY RT-PCR (FLU A&B, COVID) ARPGX2  URINE CULTURE  CBC WITH DIFFERENTIAL/PLATELET  LIPASE, BLOOD  TROPONIN I (HIGH SENSITIVITY)    EKG EKG Interpretation  Date/Time:  Sunday June 29 2021 11:14:56 EDT Ventricular Rate:  71 PR Interval:  180 QRS Duration: 76 QT Interval:  398 QTC Calculation: 432 R Axis:   -16 Text Interpretation: Normal sinus rhythm Cannot rule out Anterior infarct , age undetermined Abnormal ECG When compared to prior, similar appearance. No STEMI Confirmed by Antony Blackbird [phone]) on 06/29/2021 11:26:01 AM  Radiology DG Chest 2 View  Result Date: 06/29/2021 CLINICAL DATA:  Left upper quadrant abdominal pain and pleuritic pain. Recent colonoscopy. EXAM: CHEST - 2 VIEW COMPARISON:  Chest radiograph 12/16/2016 FINDINGS: Atherosclerotic calcification of the aortic arch. Mild cardiomegaly. Lingular density favoring scarring or atelectasis. No blunting of the costophrenic angles. No obvious indicators of free intraperitoneal gas beneath the hemidiaphragms. Thoracic spondylosis. IMPRESSION: 1. No findings of free intraperitoneal gas beneath the hemidiaphragms. 2. Indistinct  lingular density probably from scarring, although a component of atelectasis is not excluded. 3.  Aortic Atherosclerosis (ICD10-I70.0). 4. Mild enlargement of the cardiopericardial silhouette, without edema. Electronically Signed   By: Van Clines M.D.   On: 06/29/2021 12:08  CT ABDOMEN PELVIS W CONTRAST  Result Date: 06/29/2021 CLINICAL DATA:  Left-sided abdominal pain. Recent colonoscopy with polypectomy. EXAM: CT ABDOMEN AND PELVIS WITH CONTRAST TECHNIQUE: Multidetector CT imaging of the abdomen and pelvis was performed using the standard protocol following bolus administration of intravenous contrast. CONTRAST:  68m OMNIPAQUE IOHEXOL 350 MG/ML SOLN COMPARISON:  05/06/2021 FINDINGS: Lower chest: Stable scarring in the lingula. Stable (from 05/06/2021) 5 mm left lower lobe nodule on image 2 series 4. descending thoracic aortic atherosclerotic vascular calcification. Mild mitral valve calcification. Hepatobiliary: Unremarkable Pancreas: Unremarkable Spleen: Complex fluid along the left paracolic gutter tangential to the anterior margin of the spleen but also tangential to the proximal descending colon. No hypoenhancing segment of the spleen to suggest splenic infarct. No sharply defined splenic laceration. Adrenals/Urinary Tract: Unremarkable, prior right perinephric stranding has resolved. Stomach/Bowel: There is abnormal complex fluid in the left paracolic gutter adjacent to the proximal descending colon, volume at this location estimated at about 15 cc. There is also a small amount of complex fluid favoring blood products in the pelvis, volume approximately 30 cc. Currently there is no free intraperitoneal gas or definite extraluminal gas or pneumatosis identified. No dilated bowel Vascular/Lymphatic: Aortoiliac atherosclerotic vascular disease. A left periaortic lymph node on image 35 of series 2 is borderline prominent at 1.0 cm in short axis. Low-density mesenteric lymph node on image 39 series 2  measures 1.0 cm in short axis, borderline prominent as well. Reproductive: Uterus absent.  Adnexa unremarkable. Other: No supplemental non-categorized findings. Musculoskeletal: Mild sclerosis along the SI joints suggesting osteitis pubis. Lumbar spondylosis and degenerative disc disease causing mild degrees of impingement at L3-4, L4-5, and L5-S1. IMPRESSION: 1. There is about 15 cc of hemoperitoneum interposed between the anterior spleen and the proximal descending colon, and about 30 cc of hemoperitoneum in the cul-de-sac, in this patient with recent colonoscopy. No free intraperitoneal gas or extraluminal gas. Possibilities in this clinical scenario include either a bowel injury leading to intraperitoneal bleeding but without extraluminal gas; or a small anterior splenic laceration/injury which can be caused by traction on the splenocolic ligament. No compelling findings of active extravasation. Close clinical surveillance and careful management of the patient's anticoagulation in the context of this injury is recommended. 2. Borderline enlarged single retroperitoneal and single mesenteric lymph nodes. 3. Stable 5 mm left lower lobe nodule. No follow-up needed if patient is low-risk. Non-contrast chest CT can be considered in 12 months if patient is high-risk. This recommendation follows the consensus statement: Guidelines for Management of Incidental Pulmonary Nodules Detected on CT Images: From the Fleischner Society 2017; Radiology 2017; 284:228-243. 4. Aortic Atherosclerosis (ICD10-I70.0). Mild mitral valve calcification. 5. Mild lower lumbar impingement. I discussed these findings by telephone with Dr. TSherry Ruffingat approximately 2:45 p.m. on 06/29/2021. Electronically Signed   By: WVan ClinesM.D.   On: 06/29/2021 14:48    Procedures Procedures   CRITICAL CARE Performed by: CGwenyth AllegraTegeler Total critical care time: 35 minutes Critical care time was exclusive of separately billable  procedures and treating other patients. Critical care was necessary to treat or prevent imminent or life-threatening deterioration. Critical care was time spent personally by me on the following activities: development of treatment plan with patient and/or surrogate as well as nursing, discussions with consultants, evaluation of patient's response to treatment, examination of patient, obtaining history from patient or surrogate, ordering and performing treatments and interventions, ordering and review of laboratory studies, ordering and review of radiographic studies, pulse oximetry and re-evaluation  of patient's condition.   Medications Ordered in ED Medications  iohexol (OMNIPAQUE) 350 MG/ML injection 75 mL (75 mLs Intravenous Contrast Given 06/29/21 1349)    ED Course  I have reviewed the triage vital signs and the nursing notes.  Pertinent labs & imaging results that were available during my care of the patient were reviewed by me and considered in my medical decision making (see chart for details).    MDM Rules/Calculators/A&P                           Arayna DEE FRITCHMAN is a 75 y.o. female with a past medical history significant for hypertension, hyperlipidemia, TIA, paroxysmal atrial fibrillation on Xarelto, previous breast cancer, and recent colonoscopy with several polyps removed 4 days ago who presents with left upper quadrant and left-sided abdominal pain.  Patient reports that after the procedure, she has been having pain for the last 4 days that is severe in her left upper quadrant and left side of her abdomen.  She reports it is very pleuritic having abdominal pain when she takes a deep breath.  She denies any chest pain or shortness of breath.  Denies any fevers, chills, nausea, or vomiting.  Reports she has had a bowel movement since the procedure and it was not constipation, diarrhea, or bloody.  No urinary changes.  She reports the pain is up to 9 out of 10 in severity and is  extremely severe.  She was told that they had used "extra pressure" because things were "tangled" but she does not have a other details.  She denies previous pain like this.  She does reports that history of kidney stone but did not feel like this.  On exam, lungs clear chest nontender.  Abdomen is very tender in the left upper quadrant and left side.  No rash to suggest shingles.  Bowel sounds were appreciated.  Back nontender.  No flank tenderness or CVA tenderness.  Patient otherwise resting.  Had a shared decision conversation with patient.  As she just had polyp removed and a colonoscopy, we discussed that this could be just changes from the insufflation needed for the procedure especially with this extra pressure she was reported however, we also suggest this could be bleeding as she is back on her Xarelto, partial obstruction, perforation, or other acute abnormality.  We will get a CT scan with contrast to look for these abnormalities as well as get a chest x-ray given the pleuritic symptoms.  We had a shared decision-making conversation about PE work-up however given her lack of any shortness of breath or chest pain, she has a low suspicion that this is a pulmonary embolism.  She agrees that she does not want a D-dimer or CT PE study at this time but agrees to the chest x-ray and EKG and troponin.  Anticipate reassessment after work-up to determine disposition.  CT scan returned showing patient has some bleeding intraperitoneally as well as up in the splenic gutter.  They are concerned about possible splenocolic ligament tear from the procedure with subsequent bleeding as she is on blood thinners.  Less suspicious would be for perforation without any free air.  No obstruction seen.  I spoke to [facility] who requests a general surgery consultation and she will need admission to medicine for further monitoring.  They will see the patient in consultation after admission.  Just spoke to someone with  the general surgery team who will  see the patient and consultation after admission but they agree with a medicine admission for further monitoring, trending hemoglobins, and they will evaluate when admitted.  Patient remained n.p.o.  Patient will be admitted to medicine with [facility] and general surgery consults.   Final Clinical Impression(s) / ED Diagnoses Final diagnoses:  Hemoperitoneum  Left sided abdominal pain     Clinical Impression: 1. Hemoperitoneum   2. Left sided abdominal pain     Disposition: Admit  This note was prepared with assistance of Dragon voice recognition software. Occasional wrong-word or sound-a-like substitutions may have occurred due to the inherent limitations of voice recognition software.     Zeriah Baysinger, Gwenyth Allegra, MD 06/29/21 [phone]

## 2021-06-29 NOTE — H&P (Signed)
[name] Collier [id number] DOB: 24-Jun-1946 DOA: 06/29/2021   PCP: [name], DO   Outpatient Specialists:   CARDS: Dr.Donna Caroll Dr. Rayann Heman  Oncology Dr. Lindi Adie GI Dr. Delma Officer [facility] )    Patient arrived to ER on 06/29/21 at 1044 Referred by Attending Toy Baker, MD   Patient coming from: home Lives alone,       Chief Complaint: left abdomen pain   HPI: [name] Collier is a 75 y.o. female with medical history significant of a.fib on xarelto, left breast Ca sp lumpectomy, GERD, HTN , HLD, TIA    Presented with   new left sided chest and abdominal pain. Constant worse with deep breathing  At Urgent care she was diagnosed with spleen injury with some blood in the paracolic gutter and pelvis. Had Colonoscopy 3 days ago  Her xarelto was on hold but was restarted Gi did rec to hold ot for 2 days but per cardioloyg it was hel d one day before the colonoscopy She was instructed to restart on Thursday night    Has  been vaccinated against COVID  and boosted   Initial COVID TEST  NEGATIVE   Lab Results  Component Value Date   [place] 06/29/2021   [place] NEGATIVE 01/03/2021     Regarding pertinent Chronic problems:    Hyperlipidemia -  on statins Zetia Lipid Panel     Component Value Date/Time   CHOL 180 11/27/2020 0828   TRIG 91 11/27/2020 0828   HDL 62 11/27/2020 0828   CHOLHDL 2.9 11/27/2020 0828   CHOLHDL 4.6 02/27/2016 0519   VLDL 19 02/27/2016 0519   LDLCALC 101 (H) 11/27/2020 0828   LABVLDL 17 11/27/2020 0828   Breast cancer on Arimidex  HTN on norvasc, benicar lasix pRN      obesity-   BMI Readings from Last 1 Encounters:  06/29/21 45.03 kg/m         Hx of TIA -  with/out residual deficits     A. Fib -  - CHA2DS2 vas score 3 CHA2DS2/VAS Stroke Risk    current  on anticoagulation with  Xarelto                - Rhythm control: tikosyn  While in ER: Hypertensive INR 1.7 General surgery consulted  rec -hold xarelto and other blood thinning products -repeat H+H in am -discussed with patient that if H+H drops, may involve IR for selective embolization and if she has symptoms or bleeding worsens, may require surgery   ED Triage Vitals  Enc Vitals Group     BP 06/29/21 1107 (!) 190/73     Pulse Rate 06/29/21 1107 72     Resp 06/29/21 1107 (!) 24     Temp 06/29/21 1107 98.8 F (37.1 C)     Temp Source 06/29/21 1713 Oral     SpO2 06/29/21 1107 94 %     Weight 06/29/21 1106 279 lb (126.6 kg)     Height 06/29/21 1106 '5\' 6"'$  (1.676 m)     Head Circumference --      Peak Flow --      Pain Score 06/29/21 1106 8     Pain Loc --      Pain Edu? --      Excl. in [place]? --   TMAX(24)@     _________________________________________ Significant initial  Findings: Abnormal Labs Reviewed  BASIC METABOLIC PANEL - Abnormal; Notable for the following components:  Result Value   Glucose, Bld 108 (*)    All other components within normal limits  PROTIME-INR - Abnormal; Notable for the following components:   Prothrombin Time 19.6 (*)    INR 1.7 (*)    All other components within normal limits  HEPATIC FUNCTION PANEL - Abnormal; Notable for the following components:   Total Bilirubin 1.5 (*)    Bilirubin, Direct 0.3 (*)    Indirect Bilirubin 1.2 (*)    All other components within normal limits  URINALYSIS, ROUTINE W REFLEX MICROSCOPIC - Abnormal; Notable for the following components:   Leukocytes,Ua TRACE (*)    All other components within normal limits  CBC - Abnormal; Notable for the following components:   Hemoglobin 11.7 (*)    All other components within normal limits   ____________________________________________ Ordered    CTabd/pelvis - There is about 15 cc of hemoperitoneum interposed between the anterior spleen and the proximal descending colon, and about 30 cc of hemoperitoneum in the cul-de-sac, in this patient with recent colonoscopy. No free intraperitoneal gas or  extraluminal gas. Possibilities in this clinical scenario include either a bowel injury leading to intraperitoneal bleeding but without extraluminal gas; or a small anterior splenic laceration/injury which can be caused by traction on the splenocolic ligament. No compelling findings of active extravasation.   ECG: Ordered Personally reviewed by me showing: HR :71 Rhythm:  NSR,    no evidence of ischemic changes QTC 432   The recent clinical data is shown below. Vitals:   06/29/21 1330 06/29/21 1534 06/29/21 1713 06/29/21 1800  BP: (!) 153/53 (!) 156/77 (!) 178/76 (!) 154/76  Pulse: 68 (!) 55 83 73  Resp: (!) '23 14 18 20  '$ Temp:   97.6 F (36.4 C) 98.5 F (36.9 C)  TempSrc:   Oral Oral  SpO2: 95% 96% 95% 94%  Weight:      Height:        WBC     Component Value Date/Time   WBC 6.4 06/29/2021 1645   LYMPHSABS 1.0 06/29/2021 1115   MONOABS 0.8 06/29/2021 1115   EOSABS 0.1 06/29/2021 1115   BASOSABS 0.1 06/29/2021 1115       UA  no evidence of UTI    Urine analysis:    Component Value Date/Time   COLORURINE YELLOW 06/29/2021 1250   APPEARANCEUR CLEAR 06/29/2021 1250   APPEARANCEUR Cloudy (A) 04/30/2021 1138   LABSPEC 1.007 06/29/2021 1250   PHURINE 7.0 06/29/2021 1250   GLUCOSEU NEGATIVE 06/29/2021 1250   HGBUR NEGATIVE 06/29/2021 1250   BILIRUBINUR NEGATIVE 06/29/2021 1250   BILIRUBINUR Negative 04/30/2021 1138   KETONESUR NEGATIVE 06/29/2021 1250   PROTEINUR NEGATIVE 06/29/2021 1250   NITRITE NEGATIVE 06/29/2021 1250   LEUKOCYTESUR TRACE (A) 06/29/2021 1250    Results for orders placed or performed during the hospital encounter of 06/29/21  Resp Panel by RT-PCR (Flu A&B, Covid) Nasopharyngeal Swab     Status: None   Collection Time: 06/29/21  1:00 PM   Specimen: Nasopharyngeal Swab; Nasopharyngeal(NP) swabs in vial transport medium  Result Value Ref Range Status   SARS Coronavirus 2 by RT PCR NEGATIVE NEGATIVE Final         Influenza A by PCR NEGATIVE  NEGATIVE Final   Influenza B by PCR NEGATIVE NEGATIVE Final           _______________________________________________________ ER Provider Called:  General surgery   Dr.Blackman They Recommend admit to medicine   Seen on arrival  _______________________________________________ Hospitalist was  called for admission for perisplenic hemopharage   The following Work up has been ordered so far:  Orders Placed This Encounter  Procedures   Urine Culture   Resp Panel by RT-PCR (Flu A&B, Covid) Nasopharyngeal Swab   DG Chest 2 View   CT ABDOMEN PELVIS W CONTRAST   CBC with Differential   Basic metabolic panel   Protime-INR   Hepatic function panel   Lipase, blood   Urinalysis, Routine w reflex microscopic   CBC   CBC   Cardiac monitoring   Cardiac monitoring   Consult to gastroenterology   Consult to general surgery   Consult to hospitalist   ED EKG   EKG 12-Lead   Admit to Inpatient (patient's expected length of stay will be greater than 2 midnights or inpatient only procedure)    Following Medications were ordered in ER: Medications  iohexol (OMNIPAQUE) 350 MG/ML injection 75 mL (75 mLs Intravenous Contrast Given 06/29/21 1349)        Consult Orders  (From admission, onward)           Start     Ordered   06/29/21 1504  Consult to hospitalist  Spoke to [place] at [facility]  Once       Provider:  (Not yet assigned)  Question Answer Comment  Place call to: [facility]   Reason for Consult Admit      06/29/21 1503              OTHER Significant initial  Findings:  labs showing:    Recent Labs  Lab 06/29/21 1115  NA 138  K 4.5  CO2 26  GLUCOSE 108*  BUN 8  CREATININE 0.66  CALCIUM 9.1    Cr   stable,     Lab Results  Component Value Date   CREATININE 0.66 06/29/2021   CREATININE 0.73 06/19/2021   CREATININE 0.83 05/30/2021    Recent Labs  Lab 06/29/21 1123  AST 19  ALT 13  ALKPHOS 72  BILITOT 1.5*  PROT 6.8  ALBUMIN 3.9    Lab Results  Component Value Date   CALCIUM 9.1 06/29/2021          Plt: Lab Results  Component Value Date   PLT 210 06/29/2021       Recent Labs  Lab 06/29/21 1115 06/29/21 1645  WBC 6.9 6.4  NEUTROABS 5.0  --   HGB 12.3 11.7*  HCT 38.8 36.6  MCV 90.0 89.9  PLT 238 210    HG/HCT * stable,  Down *Up from baseline see below    Component Value Date/Time   HGB 11.7 (L) 06/29/2021 1645   HGB 13.3 05/30/2021 0930   HCT 36.6 06/29/2021 1645   HCT 41.8 05/30/2021 0930   MCV 89.9 06/29/2021 1645   MCV 90 05/30/2021 0930     Recent Labs  Lab 06/29/21 1123  LIPASE 11   No results for input(s): AMMONIA in the last 168 hours.   Cardiac Panel (last 3 results) No results for input(s): CKTOTAL, CKMB, TROPONINI, RELINDX in the last 72 hours.   BNP (last 3 results) No results for input(s): BNP in the last 8760 hours.    DM  labs:  HbA1C: Recent Labs    11/27/20 0828  HGBA1C 5.8       CBG (last 3)  No results for input(s): GLUCAP in the last 72 hours.        Cultures:    Component Value Date/Time  SDES  05/06/2021 1213    URINE, CLEAN CATCH Performed at [facility], [place] [address], [place], [place] [address]    SPECREQUEST  05/06/2021 1213    NONE Performed at [facility], [place] [address] [place], [place] [address]    CULT >=100,000 COLONIES/mL MORGANELLA MORGANII (A) 05/06/2021 1213   REPTSTATUS 05/08/2021 FINAL 05/06/2021 1213     Radiological Exams on Admission: DG Chest 2 View  Result Date: 06/29/2021 CLINICAL DATA:  Left upper quadrant abdominal pain and pleuritic pain. Recent colonoscopy. EXAM: CHEST - 2 VIEW COMPARISON:  Chest radiograph 12/16/2016 FINDINGS: Atherosclerotic calcification of the aortic arch. Mild cardiomegaly. Lingular density favoring scarring or atelectasis. No blunting of the costophrenic angles. No obvious indicators of free intraperitoneal gas beneath the hemidiaphragms. Thoracic  spondylosis. IMPRESSION: 1. No findings of free intraperitoneal gas beneath the hemidiaphragms. 2. Indistinct lingular density probably from scarring, although a component of atelectasis is not excluded. 3.  Aortic Atherosclerosis (ICD10-I70.0). 4. Mild enlargement of the cardiopericardial silhouette, without edema. Electronically Signed   By: Van Clines M.D.   On: 06/29/2021 12:08   CT ABDOMEN PELVIS W CONTRAST  Result Date: 06/29/2021 CLINICAL DATA:  Left-sided abdominal pain. Recent colonoscopy with polypectomy. EXAM: CT ABDOMEN AND PELVIS WITH CONTRAST TECHNIQUE: Multidetector CT imaging of the abdomen and pelvis was performed using the standard protocol following bolus administration of intravenous contrast. CONTRAST:  52m OMNIPAQUE IOHEXOL 350 MG/ML SOLN COMPARISON:  05/06/2021 FINDINGS: Lower chest: Stable scarring in the lingula. Stable (from 05/06/2021) 5 mm left lower lobe nodule on image 2 series 4. descending thoracic aortic atherosclerotic vascular calcification. Mild mitral valve calcification. Hepatobiliary: Unremarkable Pancreas: Unremarkable Spleen: Complex fluid along the left paracolic gutter tangential to the anterior margin of the spleen but also tangential to the proximal descending colon. No hypoenhancing segment of the spleen to suggest splenic infarct. No sharply defined splenic laceration. Adrenals/Urinary Tract: Unremarkable, prior right perinephric stranding has resolved. Stomach/Bowel: There is abnormal complex fluid in the left paracolic gutter adjacent to the proximal descending colon, volume at this location estimated at about 15 cc. There is also a small amount of complex fluid favoring blood products in the pelvis, volume approximately 30 cc. Currently there is no free intraperitoneal gas or definite extraluminal gas or pneumatosis identified. No dilated bowel Vascular/Lymphatic: Aortoiliac atherosclerotic vascular disease. A left periaortic lymph node on image 35 of  series 2 is borderline prominent at 1.0 cm in short axis. Low-density mesenteric lymph node on image 39 series 2 measures 1.0 cm in short axis, borderline prominent as well. Reproductive: Uterus absent.  Adnexa unremarkable. Other: No supplemental non-categorized findings. Musculoskeletal: Mild sclerosis along the SI joints suggesting osteitis pubis. Lumbar spondylosis and degenerative disc disease causing mild degrees of impingement at L3-4, L4-5, and L5-S1. IMPRESSION: 1. There is about 15 cc of hemoperitoneum interposed between the anterior spleen and the proximal descending colon, and about 30 cc of hemoperitoneum in the cul-de-sac, in this patient with recent colonoscopy. No free intraperitoneal gas or extraluminal gas. Possibilities in this clinical scenario include either a bowel injury leading to intraperitoneal bleeding but without extraluminal gas; or a small anterior splenic laceration/injury which can be caused by traction on the splenocolic ligament. No compelling findings of active extravasation. Close clinical surveillance and careful management of the patient's anticoagulation in the context of this injury is recommended. 2. Borderline enlarged single retroperitoneal and single mesenteric lymph nodes. 3. Stable 5 mm left lower lobe nodule. No follow-up needed  if patient is low-risk. Non-contrast chest CT can be considered in 12 months if patient is high-risk. This recommendation follows the consensus statement: Guidelines for Management of Incidental Pulmonary Nodules Detected on CT Images: From the Fleischner Society 2017; Radiology 2017; 284:228-243. 4. Aortic Atherosclerosis (ICD10-I70.0). Mild mitral valve calcification. 5. Mild lower lumbar impingement. I discussed these findings by telephone with Dr. Sherry Ruffing at approximately 2:45 p.m. on 06/29/2021. Electronically Signed   By: Van Clines M.D.   On: 06/29/2021 14:48    _______________________________________________________________________________________________________ Latest  Blood pressure (!) 154/76, pulse 73, temperature 98.5 F (36.9 C), temperature source Oral, resp. rate 20, height '5\' 6"'$  (1.676 m), weight 126.6 kg, SpO2 94 %.   Review of Systems:    Pertinent positives include:  abdominal pain,   Constitutional:  No weight loss, night sweats, Fevers, chills, fatigue, weight loss  HEENT:  No headaches, Difficulty swallowing,Tooth/dental problems,Sore throat,  No sneezing, itching, ear ache, nasal congestion, post nasal drip,  Cardio-vascular:  No chest pain, Orthopnea, PND, anasarca, dizziness, palpitations.no Bilateral lower extremity swelling  GI:  No heartburn, indigestion, nausea, vomiting, diarrhea, change in bowel habits, loss of appetite, melena, blood in stool, hematemesis Resp:  no shortness of breath at rest. No dyspnea on exertion, No excess mucus, no productive cough, No non-productive cough, No coughing up of blood.No change in color of mucus.No wheezing. Skin:  no rash or lesions. No jaundice GU:  no dysuria, change in color of urine, no urgency or frequency. No straining to urinate.  No flank pain.  Musculoskeletal:  No joint pain or no joint swelling. No decreased range of motion. No back pain.  Psych:  No change in mood or affect. No depression or anxiety. No memory loss.  Neuro: no localizing neurological complaints, no tingling, no weakness, no double vision, no gait abnormality, no slurred speech, no confusion  All systems reviewed and apart from [place] all are negative _______________________________________________________________________________________________ Past Medical History:   Past Medical History:  Diagnosis Date   Anxiety    Atrial fibrillation ([place])    Cancer ([place]) 11/2020   left breast IMC   GERD (gastroesophageal reflux disease)    HTN (hypertension)    Hyperlipidemia    TIA (transient ischemic  attack)       Past Surgical History:  Procedure Laterality Date   BOTOX INJECTION N/A 12/22/2018   Procedure: BOTOX INJECTION INTO ANAL SPHINCTER;  Surgeon: Ileana Roup, MD;  Location: [facility];  Service: General;  Laterality: N/A;   BREAST BIOPSY Left 11/26/2020   BREAST LUMPECTOMY WITH RADIOACTIVE SEED AND SENTINEL LYMPH NODE BIOPSY Left 01/07/2021   Procedure: LEFT BREAST LUMPECTOMY WITH RADIOACTIVE SEED AND LEFT AXILLARY SENTINEL LYMPH NODE BIOPSY WITH BLUE DYE INJECTION;  Surgeon: Donnie Mesa, MD;  Location: [facility];  Service: General;  Laterality: Left;  LMA WITH PECTORAL BLOCK, BLUE DYE INJECTION   CARDIOVERSION N/A 01/04/2018   Procedure: CARDIOVERSION;  Surgeon: Dorothy Spark, MD;  Location: [facility];  Service: Cardiovascular;  Laterality: N/A;   CARDIOVERSION N/A 09/20/2018   Procedure: CARDIOVERSION;  Surgeon: Sueanne Margarita, MD;  Location: [facility];  Service: Cardiovascular;  Laterality: N/A;   CARDIOVERSION N/A 10/27/2018   Procedure: CARDIOVERSION;  Surgeon: Buford Dresser, MD;  Location: [facility];  Service: Cardiovascular;  Laterality: N/A;   KNEE SURGERY Bilateral    Torn miniscus   RECTAL EXAM UNDER ANESTHESIA N/A 12/22/2018   Procedure: ANORECTAL  EXAM UNDER ANESTHESIA;  Surgeon: Ileana Roup, MD;  Location:  [facility];  Service: General;  Laterality: N/A;   RECTOCELE REPAIR     TONSILLECTOMY     VAGINAL HYSTERECTOMY      Social History:  Ambulatory   independently     reports that she has never smoked. She has never used smokeless tobacco. She reports that she does not drink alcohol and does not use drugs.     Family History:   Family History  Problem Relation Age of Onset   Hypertension Mother    Hyperlipidemia Mother    Neurologic Disorder Mother 70       GB   Stroke Son    Stroke Maternal Uncle    Stroke Grandchild    Prostate cancer Brother     ______________________________________________________________________________________________ Allergies: Allergies  Allergen Reactions   Augmentin [Amoxicillin-Pot Clavulanate] Other (See Comments)    c-diff Has patient had a PCN reaction causing immediate rash, facial/tongue/throat swelling, SOB or lightheadedness with hypotension: No Has patient had a PCN reaction causing severe rash involving mucus membranes or skin necrosis: No Has patient had a PCN reaction that required hospitalization: No Has patient had a PCN reaction occurring within the last 10 years: Yes If all of the above answers are "NO", then may proceed with Cephalosporin use.    Codeine Palpitations and Rash   Prednisone Other (See Comments)    Jittery, red in the face     Prior to Admission medications   Medication Sig Start Date End Date Taking? Authorizing Provider  acetaminophen (TYLENOL) 500 MG tablet Take 500 mg by mouth every 6 (six) hours as needed for moderate pain or headache.    [provider]  amLODipine (NORVASC) 5 MG tablet TAKE 1 AND 1/2 TABLETS BY  MOUTH DAILY Patient taking differently: Take 5 mg by mouth daily. 04/30/21   Loman Brooklyn, FNP  anastrozole (ARIMIDEX) 1 MG tablet Take 1 tablet (1 mg total) by mouth daily. 02/04/21   [name], MD  calcium gluconate 500 MG tablet Take 1 tablet by mouth 3 (three) times daily.    [provider]  Cholecalciferol (VITAMIN D3) 5000 units CAPS Take 5,000 Units by mouth at bedtime.    [provider]  dofetilide (TIKOSYN) 500 MCG capsule TAKE 1 CAPSULE BY MOUTH 2 TIMES DAILY. 06/10/21   Sherran Needs, NP  DULoxetine (CYMBALTA) 30 MG capsule TAKE 1 CAPSULE BY MOUTH  DAILY 02/04/21   Ronnie Doss M, DO  ezetimibe (ZETIA) 10 MG tablet TAKE 1 TABLET BY MOUTH  DAILY 05/06/21   Ronnie Doss M, DO  famotidine (PEPCID) 20 MG tablet TAKE 1 TABLET BY MOUTH  TWICE DAILY 12/12/20   Ronnie Doss M, DO  furosemide (LASIX) 20 MG  tablet TAKE 1 TABLET BY MOUTH  DAILY 05/06/21   Ronnie Doss M, DO  olmesartan (BENICAR) 5 MG tablet TAKE 1 TABLET BY MOUTH  DAILY 05/06/21   Ronnie Doss M, DO  polyethylene glycol-electrolytes (NULYTELY) 420 g solution See admin instructions. Patient not taking: Reported on 06/19/2021 06/05/21   [provider]  potassium chloride SA (KLOR-CON) 20 MEQ tablet TAKE 1 TABLET BY MOUTH  TWICE DAILY 05/06/21   Ronnie Doss M, DO  XARELTO 20 MG TABS tablet TAKE 1 TABLET BY MOUTH DAILY WITH SUPPER. 05/12/21   Sherran Needs, NP    ___________________________________________________________________________________________________ Physical Exam: Vitals with BMI 06/29/2021 06/29/2021 06/29/2021  Height - - -  Weight - - -  BMI - - -  Systolic [id number]  Diastolic  76 76 77  Pulse 73 83 55     1. General:  in No  Acute distress   Chronically ill -appearing 2. Psychological: Alert and  Oriented 3. Head/ENT:   Dry Mucous Membranes                          Head Non traumatic, neck supple                          Poor Dentition 4. SKIN:  decreased Skin turgor,  Skin clean Dry and intact no rash 5. Heart: Regular rate and rhythm no  Murmur, no Rub or gallop 6. Lungs:  Clear to auscultation bilaterally, no wheezes or crackles   7. Abdomen: Soft, LUQ-tender, Non distended   obese  bowel sounds present 8. Lower extremities: no clubbing, cyanosis, no  edema 9. Neurologically Grossly intact, moving all 4 extremities equally  10. MSK: Normal range of motion    Chart has been reviewed  ______________________________________________________________________________________________  Assessment/Plan 75 y.o. female with medical history significant of a.fib on xarelto, left breast Ca sp lumpectomy, GERD, HTN , HLD, TIA   Admitted for splenic hemorrhage  Present on Admission:  Spleen injury with intraabdominal hemoperitoneum in the setting of recent colonoscopy, hold xarelto Follow  CBC, if evidence of drop would need IR consult for possible embolization, monitor vitals    Persistent atrial fibrillation continue Tikosyn.  Currently appears to be in sinus hold anticoagulation emailed cardiology patient has been admitted will need their recommendations regarding restarting anticoagulation timing.   Essential hypertension -chronic stable continue Norvasc  History of breast cancer continue home medications  Hyperlipidemia continue Zetia  Other plan as per orders.  DVT prophylaxis:  SCD      Code Status:    Code Status: Prior FULL CODE  as per patient   I had personally discussed CODE STATUS with patient      Family Communication:   Family not at  Bedside    Disposition Plan:    To home once workup is complete and patient is stable   Following barriers for discharge:                                                           Anemia stable                             Pain controlled with PO medications                                                            Will need to be able to tolerate PO                                                        Will need consultants to evaluate patient prior to discharge  Would  benefit from PT/OT eval prior to DC  Ordered                                        Consults called: general surgery, Spoke to Dr. Kieth Brightly, [facility] Dr. Delma Officer, emailed cardiology   Admission status:  ED Disposition     ED Disposition  [place]: [place] [[id number]]  Level of Care: Progressive [102]  Admit to Progressive based on following criteria: GI, ENDOCRINE disease patients with GI bleeding, acute liver failure or pancreatitis, stable with diabetic ketoacidosis or thyrotoxicosis (hypothyroid) state.  May admit patient to [facility] or [facility] if equivalent level of care is available:: No  Interfacility transfer: Yes  Covid Evaluation: Confirmed COVID Negative  Diagnosis:  Spleen injury [[id number]]  Admitting Physician: Kayleen Memos [id number]  Attending Physician: Kayleen Memos [id number]  Estimated length of stay: past midnight tomorrow  Certification:: I certify this patient will need inpatient services for at least 2 midnights           inpatient     I Expect 2 midnight stay secondary to severity of patient's current illness need for inpatient interventions justified by the following:  Severe lab/radiological/exam abnormalities including:    Intraabdominal hemorrhage  and extensive comorbidities including:   Chronic anticoagulation  That are currently affecting medical management.   I expect  patient to be hospitalized for 2 midnights requiring inpatient medical care.  Patient is at high risk for adverse outcome (such as loss of life or disability) if not treated.  Indication for inpatient stay as follows:   severe pain requiring acute inpatient management,     Need for  , IV fluids, I IV pain medications,     Level of care   progressive tele indefinitely please discontinue once patient no longer qualifies COVID-19 Labs    Lab Results  Component Value Date   [place] 06/29/2021     Precautions: admitted as  Covid Negative  PPE: Used by the provider:   N95  eye Goggles,  Gloves       Aimie Wagman 06/29/2021, 9:57 PM    [facility]     after 2 AM please page floor coverage PA If 7AM-7PM, please contact the day team taking care of the patient using [email or web address]   Patient was evaluated in the context of the global COVID-19 pandemic, which necessitated consideration that the patient might be at risk for infection with the SARS-CoV-2 virus that causes COVID-19. Institutional protocols and algorithms that pertain to the evaluation of patients at risk for COVID-19 are in a state of rapid change based on information released by regulatory bodies including the CDC and federal and state organizations. These  policies and algorithms were followed during the patient's care.

## 2021-06-30 ENCOUNTER — Encounter (HOSPITAL_COMMUNITY): Payer: Self-pay | Admitting: Internal Medicine

## 2021-06-30 DIAGNOSIS — I10 Essential (primary) hypertension: Secondary | ICD-10-CM

## 2021-06-30 DIAGNOSIS — I4819 Other persistent atrial fibrillation: Secondary | ICD-10-CM

## 2021-06-30 DIAGNOSIS — S3600XD Unspecified injury of spleen, subsequent encounter: Secondary | ICD-10-CM

## 2021-06-30 DIAGNOSIS — I480 Paroxysmal atrial fibrillation: Secondary | ICD-10-CM

## 2021-06-30 LAB — CBC
HCT: 38.2 % (ref 36.0–46.0)
Hemoglobin: 12.2 g/dL (ref 12.0–15.0)
MCH: 29 pg (ref 26.0–34.0)
MCHC: 31.9 g/dL (ref 30.0–36.0)
MCV: 90.7 fL (ref 80.0–100.0)
Platelets: 217 10*3/uL (ref 150–400)
RBC: 4.21 MIL/uL (ref 3.87–5.11)
RDW: 13.2 % (ref 11.5–15.5)
WBC: 6.2 10*3/uL (ref 4.0–10.5)
nRBC: 0 % (ref 0.0–0.2)

## 2021-06-30 LAB — TSH: TSH: 4.353 u[IU]/mL (ref 0.350–4.500)

## 2021-06-30 LAB — COMPREHENSIVE METABOLIC PANEL
ALT: 15 U/L (ref 0–44)
AST: 16 U/L (ref 15–41)
Albumin: 3.2 g/dL — ABNORMAL LOW (ref 3.5–5.0)
Alkaline Phosphatase: 70 U/L (ref 38–126)
Anion gap: 8 (ref 5–15)
BUN: <5 mg/dL — ABNORMAL LOW (ref 8–23)
CO2: 26 mmol/L (ref 22–32)
Calcium: 9.2 mg/dL (ref 8.9–10.3)
Chloride: 104 mmol/L (ref 98–111)
Creatinine, Ser: 0.66 mg/dL (ref 0.44–1.00)
GFR, Estimated: >60 mL/min (ref >60)
Glucose, Bld: 120 mg/dL — ABNORMAL HIGH (ref 70–99)
Potassium: 3.7 mmol/L (ref 3.5–5.1)
Sodium: 138 mmol/L (ref 135–145)
Total Bilirubin: 1.8 mg/dL — ABNORMAL HIGH (ref 0.3–1.2)
Total Protein: 6.7 g/dL (ref 6.5–8.1)

## 2021-06-30 LAB — TYPE AND SCREEN
ABO/RH(D): A POS
Antibody Screen: NEGATIVE

## 2021-06-30 LAB — URINE CULTURE: Culture: <10000 — AB

## 2021-06-30 LAB — PHOSPHORUS: Phosphorus: 4.1 mg/dL (ref 2.5–4.6)

## 2021-06-30 LAB — ABO/RH: ABO/RH(D): A POS

## 2021-06-30 LAB — MAGNESIUM: Magnesium: 1.8 mg/dL (ref 1.7–2.4)

## 2021-06-30 MED ORDER — MAGNESIUM SULFATE 2 GM/50ML IV SOLN
2.0000 g | Freq: Once | INTRAVENOUS | Status: AC
  Administered 2021-06-30: 2 g via INTRAVENOUS
  Filled 2021-06-30: qty 50

## 2021-06-30 MED ORDER — POTASSIUM CHLORIDE CRYS ER 20 MEQ PO TBCR
40.0000 meq | EXTENDED_RELEASE_TABLET | Freq: Once | ORAL | Status: AC
  Administered 2021-06-30: 40 meq via ORAL
  Filled 2021-06-30: qty 2

## 2021-06-30 MED ORDER — HYDRALAZINE HCL 25 MG PO TABS: 25.0000 mg | ORAL_TABLET | Freq: Three times a day (TID) | ORAL | Status: DC | PRN

## 2021-06-30 NOTE — Progress Notes (Signed)
SATURATION QUALIFICATIONS: (This note is used to comply with regulatory documentation for home oxygen)  Patient Saturations on Room Air at Rest = 92%  Patient Saturations on Room Air while Ambulating = 87%  Please briefly explain why patient needs home oxygen: Pt reports SOB during long distance mobility typical. Within 10 seconds, SpO2 increased to 93% with seated rest break after mobility.

## 2021-06-30 NOTE — Progress Notes (Signed)
PT Cancellation Note  Patient Details Name: [name] Collier MRN: [id number] DOB: Sep 07, 1946   Cancelled Treatment:    Reason Eval/Treat Not Completed: PT screened, no needs identified, will sign off. Per OT pt at baseline with mobility and able to ambulate in hallways.    Shary Decamp [facility] 06/30/2021, 10:14 AM [place] Pager [phone] Office [phone]

## 2021-06-30 NOTE — Consult Note (Signed)
Cardiology Consultation:   Patient ID: [name] Collier MRN: [id number]; DOB: 1946/02/18  Admit date: 06/29/2021 Date of Consult: 06/30/2021  Primary Care Provider: [name], DO [facility] Cardiologist: Minus Breeding, MD  [facility] Electrophysiologist:  Thompson Grayer, MD    Patient Profile:   [name] Collier is a 75 y.o. female with a hx of atrial fibrillation, HTN, HLD and TIA who is being seen today for the evaluation of chronic anticoagulation in the setting of splenic hemorrhage at the request of Hosie Poisson, MD.  History of Present Illness:   [name] Collier is a 75yo female with a hx of PAF, GER, HTN, HLD and TIA.  She is followed in afib clinic for PAF controlled on Tikosyn and Xarelto. Unfortunately she had had a mechanical fall a few weeks ago and had some issues with afib and was placed on Lopressor '25mg'$  BID and converted back to NSR but BB had to be stopped due to hypotension and bradycardia.  She was last seen in afib clnic on 06/19/2021 and was doing well maintaining NSR.  Her Xarelto was recently held for 36 hours prior to undergoing colonoscopy which she underwent last Thursday and then resumed her Sheila Oats that night.  She started having left sided abdominal and lower chest pain that was pleuritic and went to Urgent care and was diagnosed with a splenic injury with blood in the pericolic gutter and pelvis.  She was admitted to [facility] service.  General surgery saw today and recommend conservative management off Xarelto.  Her Hbg is stable but recommended IR consult for embolization if she has a significant drop in her Hbg.  Cardiology is now asked to consult in regards to management of her anticoagulation.  She denies any anginal CP, SOB, DOE, PND, orthopnea, LE edema, dizziness or palpitations.  She is maintaining NSR on tele.     Past Medical History:  Diagnosis Date   Anxiety    Atrial fibrillation ([place])    Cancer ([place]) 11/2020   left breast IMC   GERD  (gastroesophageal reflux disease)    HTN (hypertension)    Hyperlipidemia    TIA (transient ischemic attack)     Past Surgical History:  Procedure Laterality Date   BOTOX INJECTION N/A 12/22/2018   Procedure: BOTOX INJECTION INTO ANAL SPHINCTER;  Surgeon: Ileana Roup, MD;  Location: [facility];  Service: General;  Laterality: N/A;   BREAST BIOPSY Left 11/26/2020   BREAST LUMPECTOMY WITH RADIOACTIVE SEED AND SENTINEL LYMPH NODE BIOPSY Left 01/07/2021   Procedure: LEFT BREAST LUMPECTOMY WITH RADIOACTIVE SEED AND LEFT AXILLARY SENTINEL LYMPH NODE BIOPSY WITH BLUE DYE INJECTION;  Surgeon: Donnie Mesa, MD;  Location: [place];  Service: General;  Laterality: Left;  LMA WITH PECTORAL BLOCK, BLUE DYE INJECTION   CARDIOVERSION N/A 01/04/2018   Procedure: CARDIOVERSION;  Surgeon: Dorothy Spark, MD;  Location: [facility] ENDOSCOPY;  Service: Cardiovascular;  Laterality: N/A;   CARDIOVERSION N/A 09/20/2018   Procedure: CARDIOVERSION;  Surgeon: Sueanne Margarita, MD;  Location: [facility] ENDOSCOPY;  Service: Cardiovascular;  Laterality: N/A;   CARDIOVERSION N/A 10/27/2018   Procedure: CARDIOVERSION;  Surgeon: Buford Dresser, MD;  Location: [place];  Service: Cardiovascular;  Laterality: N/A;   KNEE SURGERY Bilateral    Torn miniscus   RECTAL EXAM UNDER ANESTHESIA N/A 12/22/2018   Procedure: ANORECTAL  EXAM UNDER ANESTHESIA;  Surgeon: Ileana Roup, MD;  Location: [facility];  Service: General;  Laterality: N/A;   [place]  TONSILLECTOMY     VAGINAL HYSTERECTOMY       Home Medications:  Prior to Admission medications   Medication Sig Start Date End Date Taking? Authorizing Provider  acetaminophen (TYLENOL) 500 MG tablet Take 1,000 mg by mouth every 6 (six) hours as needed for moderate pain or headache.   Yes [provider]  amLODipine (NORVASC) 5 MG tablet TAKE 1 AND 1/2 TABLETS BY  MOUTH DAILY Patient taking differently: Take 5 mg by mouth every  morning. 04/30/21  Yes Loman Brooklyn, FNP  anastrozole (ARIMIDEX) 1 MG tablet Take 1 tablet (1 mg total) by mouth daily. Patient taking differently: Take 1 mg by mouth every morning. 02/04/21  Yes [name], MD  Calcium Carbonate-Vit D-Min (CALCIUM 1200 PO) Take 1,200 mg by mouth every morning.   Yes [provider]  Cholecalciferol (VITAMIN D3) 5000 units CAPS Take 5,000 Units by mouth daily with supper.   Yes [provider]  dofetilide (TIKOSYN) 500 MCG capsule TAKE 1 CAPSULE BY MOUTH 2 TIMES DAILY. Patient taking differently: Take 500 mcg by mouth 2 (two) times daily. 9am and 9pm 06/10/21  Yes Sherran Needs, NP  DULoxetine (CYMBALTA) 30 MG capsule TAKE 1 CAPSULE BY MOUTH  DAILY Patient taking differently: Take 30 mg by mouth at bedtime. 9pm 02/04/21  Yes Gottschalk, Leatrice Jewels M, DO  ezetimibe (ZETIA) 10 MG tablet TAKE 1 TABLET BY MOUTH  DAILY Patient taking differently: Take 10 mg by mouth every morning. 05/06/21  Yes Gottschalk, Ashly M, DO  famotidine (PEPCID) 20 MG tablet TAKE 1 TABLET BY MOUTH  TWICE DAILY Patient taking differently: Take 20 mg by mouth 2 (two) times daily with a meal. 12/12/20  Yes Gottschalk, Ashly M, DO  furosemide (LASIX) 20 MG tablet TAKE 1 TABLET BY MOUTH  DAILY Patient taking differently: Take 20 mg by mouth every morning. 05/06/21  Yes Gottschalk, Ashly M, DO  olmesartan (BENICAR) 5 MG tablet TAKE 1 TABLET BY MOUTH  DAILY Patient taking differently: Take 5 mg by mouth every morning. 05/06/21  Yes Gottschalk, Ashly M, DO  potassium chloride SA (KLOR-CON) 20 MEQ tablet TAKE 1 TABLET BY MOUTH  TWICE DAILY Patient taking differently: Take 20 mEq by mouth 2 (two) times daily with a meal. 05/06/21  Yes Gottschalk, Ashly M, DO  XARELTO 20 MG TABS tablet TAKE 1 TABLET BY MOUTH DAILY WITH SUPPER. Patient taking differently: Take 20 mg by mouth daily with supper. 05/12/21  Yes Sherran Needs, NP    Inpatient Medications: Scheduled Meds:  amLODipine  5 mg  Oral Daily   anastrozole  1 mg Oral Daily   dofetilide  500 mcg Oral BID   DULoxetine  30 mg Oral Daily   ezetimibe  10 mg Oral Daily   Continuous Infusions:  magnesium sulfate bolus IVPB     PRN Meds: acetaminophen **OR** acetaminophen  Allergies:    Allergies  Allergen Reactions   Augmentin [Amoxicillin-Pot Clavulanate] Other (See Comments)    c-diff Has patient had a PCN reaction causing immediate rash, facial/tongue/throat swelling, SOB or lightheadedness with hypotension: No Has patient had a PCN reaction causing severe rash involving mucus membranes or skin necrosis: No Has patient had a PCN reaction that required hospitalization: No Has patient had a PCN reaction occurring within the last 10 years: Yes If all of the above answers are "NO", then may proceed with Cephalosporin use.    Codeine Palpitations and Rash   Prednisone Other (See Comments)    Jittery,  red in the face    Social History:   Social History   Socioeconomic History   Marital status: Widowed    Spouse name: Not on file   Number of children: 2   Years of education: 26   Highest education level: Not on file  Occupational History   Occupation: Retired    Comment: Personnel officer  Tobacco Use   Smoking status: Never   Smokeless tobacco: Never  Scientific laboratory technician Use: Never used  Substance and Sexual Activity   Alcohol use: No    Alcohol/week: 0.0 standard drinks   Drug use: No   Sexual activity: Not Currently    Birth control/protection: Post-menopausal  Other Topics Concern   Not on file  Social History Narrative   Lives alone - children in [place] -    Caffeine use: Soda/tea daily   Good church support group   Social Determinants of Health   Financial Resource Strain: Low Risk    Difficulty of Paying Living Expenses: Not hard at all  Food Insecurity: No Food Insecurity   Worried About Charity fundraiser in the Last Year: Never true   Arboriculturist in the Last  Year: Never true  Transportation Needs: No Transportation Needs   Lack of Transportation (Medical): No   Lack of Transportation (Non-Medical): No  Physical Activity: Insufficiently Active   Days of Exercise per Week: 7 days   Minutes of Exercise per Session: 10 min  Stress: Stress Concern Present   Feeling of Stress : To some extent  Social Connections: Moderately Integrated   Frequency of Communication with Friends and Family: More than three times a week   Frequency of Social Gatherings with Friends and Family: More than three times a week   Attends Religious Services: More than 4 times per year   Active Member of [employer] or Organizations: Yes   Attends Archivist Meetings: More than 4 times per year   Marital Status: Widowed  Human resources officer Violence: Not At Risk   Fear of Current or Ex-Partner: No   Emotionally Abused: No   Physically Abused: No   Sexually Abused: No    Family History:    Family History  Problem Relation Age of Onset   Hypertension Mother    Hyperlipidemia Mother    Neurologic Disorder Mother 37       GB   Stroke Son    Stroke Maternal Uncle    Stroke Grandchild    Prostate cancer Brother      ROS:  Please see the history of present illness.   All other ROS reviewed and negative.     Physical Exam/Data:   Vitals:   06/29/21 2001 06/29/21 2312 06/30/21 0318 06/30/21 0742  BP: (!) 157/56 (!) 160/62 (!) 158/63 (!) 168/73  Pulse: 76 85 71 75  Resp: '16 20 20 '$ (!) 22  Temp: 98.2 F (36.8 C) 98.4 F (36.9 C) 98.7 F (37.1 C) 98.2 F (36.8 C)  TempSrc: Oral Oral Oral Oral  SpO2: 94% 92% 93% 90%  Weight:      Height:        Intake/Output Summary (Last 24 hours) at 06/30/2021 1151 Last data filed at 06/30/2021 0603 Gross per 24 hour  Intake 812.77 ml  Output 200 ml  Net 612.77 ml   Last 3 Weights 06/29/2021 06/19/2021 05/30/2021  Weight (lbs) 279 lb 282 lb 3.2 oz 282 lb 3.2 oz  Weight (kg) 126.554 kg  128.005 kg 128.005 kg     Body  mass index is 45.03 kg/m.  General:  Well nourished, well developed, in no acute distress HEENT: normal Lymph: no adenopathy Neck: no JVD Endocrine:  No thryomegaly Vascular: No carotid bruits; FA pulses 2+ bilaterally without bruits  Cardiac:  normal S1, S2; RRR; no murmur  Lungs:  clear to auscultation bilaterally, no wheezing, rhonchi or rales  Abd: soft, nontender, no hepatomegaly  Ext: no edema Musculoskeletal:  No deformities, BUE and BLE strength normal and equal Skin: warm and dry  Neuro:  CNs 2-12 intact, no focal abnormalities noted Psych:  Normal affect   EKG:  The EKG was personally reviewed and demonstrates:  NSR with anterior infarct and no ST changes Telemetry:  Telemetry was personally reviewed and demonstrates:  NSR  Relevant CV Studies: none  Laboratory Data:  High Sensitivity Troponin:   Recent Labs  Lab 06/29/21 1123  TROPONINIHS 5     Chemistry Recent Labs  Lab 06/29/21 1115 06/30/21 0109  NA 138 138  K 4.5 3.7  CL 103 104  CO2 26 26  GLUCOSE 108* 120*  BUN 8 <5*  CREATININE 0.66 0.66  CALCIUM 9.1 9.2  GFRNONAA >60 >60  ANIONGAP 9 8    Recent Labs  Lab 06/29/21 1123 06/30/21 0109  PROT 6.8 6.7  ALBUMIN 3.9 3.2*  AST 19 16  ALT 13 15  ALKPHOS 72 70  BILITOT 1.5* 1.8*   Hematology Recent Labs  Lab 06/29/21 1115 06/29/21 1645 06/30/21 0926  WBC 6.9 6.4 6.2  RBC 4.31 4.07 4.21  HGB 12.3 11.7* 12.2  HCT 38.8 36.6 38.2  MCV 90.0 89.9 90.7  MCH 28.5 28.7 29.0  MCHC 31.7 32.0 31.9  RDW 13.1 13.2 13.2  PLT 238 210 217   BNPNo results for input(s): BNP, PROBNP in the last 168 hours.  DDimer No results for input(s): DDIMER in the last 168 hours.   Radiology/Studies:  DG Chest 2 View  Result Date: 06/29/2021 CLINICAL DATA:  Left upper quadrant abdominal pain and pleuritic pain. Recent colonoscopy. EXAM: CHEST - 2 VIEW COMPARISON:  Chest radiograph 12/16/2016 FINDINGS: Atherosclerotic calcification of the aortic arch. Mild  cardiomegaly. Lingular density favoring scarring or atelectasis. No blunting of the costophrenic angles. No obvious indicators of free intraperitoneal gas beneath the hemidiaphragms. Thoracic spondylosis. IMPRESSION: 1. No findings of free intraperitoneal gas beneath the hemidiaphragms. 2. Indistinct lingular density probably from scarring, although a component of atelectasis is not excluded. 3.  Aortic Atherosclerosis (ICD10-I70.0). 4. Mild enlargement of the cardiopericardial silhouette, without edema. Electronically Signed   By: Van Clines M.D.   On: 06/29/2021 12:08   CT ABDOMEN PELVIS W CONTRAST  Result Date: 06/29/2021 CLINICAL DATA:  Left-sided abdominal pain. Recent colonoscopy with polypectomy. EXAM: CT ABDOMEN AND PELVIS WITH CONTRAST TECHNIQUE: Multidetector CT imaging of the abdomen and pelvis was performed using the standard protocol following bolus administration of intravenous contrast. CONTRAST:  59m OMNIPAQUE IOHEXOL 350 MG/ML SOLN COMPARISON:  05/06/2021 FINDINGS: Lower chest: Stable scarring in the lingula. Stable (from 05/06/2021) 5 mm left lower lobe nodule on image 2 series 4. descending thoracic aortic atherosclerotic vascular calcification. Mild mitral valve calcification. Hepatobiliary: Unremarkable Pancreas: Unremarkable Spleen: Complex fluid along the left paracolic gutter tangential to the anterior margin of the spleen but also tangential to the proximal descending colon. No hypoenhancing segment of the spleen to suggest splenic infarct. No sharply defined splenic laceration. Adrenals/Urinary Tract: Unremarkable, prior right perinephric stranding has resolved. Stomach/Bowel:  There is abnormal complex fluid in the left paracolic gutter adjacent to the proximal descending colon, volume at this location estimated at about 15 cc. There is also a small amount of complex fluid favoring blood products in the pelvis, volume approximately 30 cc. Currently there is no free  intraperitoneal gas or definite extraluminal gas or pneumatosis identified. No dilated bowel Vascular/Lymphatic: Aortoiliac atherosclerotic vascular disease. A left periaortic lymph node on image 35 of series 2 is borderline prominent at 1.0 cm in short axis. Low-density mesenteric lymph node on image 39 series 2 measures 1.0 cm in short axis, borderline prominent as well. Reproductive: Uterus absent.  Adnexa unremarkable. Other: No supplemental non-categorized findings. Musculoskeletal: Mild sclerosis along the SI joints suggesting osteitis pubis. Lumbar spondylosis and degenerative disc disease causing mild degrees of impingement at L3-4, L4-5, and L5-S1. IMPRESSION: 1. There is about 15 cc of hemoperitoneum interposed between the anterior spleen and the proximal descending colon, and about 30 cc of hemoperitoneum in the cul-de-sac, in this patient with recent colonoscopy. No free intraperitoneal gas or extraluminal gas. Possibilities in this clinical scenario include either a bowel injury leading to intraperitoneal bleeding but without extraluminal gas; or a small anterior splenic laceration/injury which can be caused by traction on the splenocolic ligament. No compelling findings of active extravasation. Close clinical surveillance and careful management of the patient's anticoagulation in the context of this injury is recommended. 2. Borderline enlarged single retroperitoneal and single mesenteric lymph nodes. 3. Stable 5 mm left lower lobe nodule. No follow-up needed if patient is low-risk. Non-contrast chest CT can be considered in 12 months if patient is high-risk. This recommendation follows the consensus statement: Guidelines for Management of Incidental Pulmonary Nodules Detected on CT Images: From the Fleischner Society 2017; Radiology 2017; 284:228-243. 4. Aortic Atherosclerosis (ICD10-I70.0). Mild mitral valve calcification. 5. Mild lower lumbar impingement. I discussed these findings by telephone with  Dr. Sherry Ruffing at approximately 2:45 p.m. on 06/29/2021. Electronically Signed   By: Van Clines M.D.   On: 06/29/2021 14:48     Assessment and Plan:   Splenic hemorrhage -occurred after restarting her Xarelto after colonoscopy -apparently had had a fall a few weeks prior and ? Whether it was a delayed event from that -surgery is following and recommend conservative approach given her stable Hbg at 12.2 -per surgery needs to stay off anticoagulation for now  2.  PAF -she is maintaining NSR on exam today -continue Tikisyn 561mg BID  -she is on Xarelto chronically for her PAF but currently on hold due to splenic bleed -her CHADS2VASC score is high at at least 5 with hx of TIA -at this time DOAC needs to be held.  She is at increased risk of cardioembolic events off DOAC but is maintaining NSR on exam -would restart DOAC once ok with Surgery and consider changing to Eliquis '5mg'$  BID which has a better bleeding risk profile  CHA2DS2-VASc Score = 5  This indicates a 7.2% annual risk of stroke. The patient's score is based upon: CHF History: No HTN History: Yes Diabetes History: No Stroke History: Yes Vascular Disease History: No Age Score: 1 Gender Score: 1  3.  HTN -BP elevated on exam> ? Related to pain -continue amlodipine '5mg'$  daily and titrate as needed for BP control   [facility] will sign off.   Medication Recommendations:  Tikosyn 5056m BID.  Restart DOAC when ok with surgery preferably Eliquis '5mg'$  BID Other recommendations (labs, testing, etc):  none Follow up as an  outpatient:  afib clinic 2-3 weeks  For questions or updates, please contact [place] Please consult [email or web address] for contact info under    Signed, Fransico Him, MD  06/30/2021 11:51 AM

## 2021-06-30 NOTE — Progress Notes (Signed)
SOCIAL VISIT--  This is a patient on whom I did colonoscopy and has had progressive, persistent LUQ discomfort due to a splenic injury, which is an occasional complication of that procedure, probably accentuated by use of Xarelto.  At present, patient is comfortable at rest (still pain with movement or deep breaths) and hgb is fairly stable, VSS.  Management by Hospitalist team and input from surgeons is much appreciated.  No specific suggestions from our standpoint beyond the supportive care and observation currently being pursued, but please let us know if you would like [place] to formally consult on this patient or if we can be of any further assistance.  Cleotis Nipper, M.D. Pager [phone] If no answer or after 5 PM call [phone]'

## 2021-06-30 NOTE — Progress Notes (Signed)
Mobility Specialist: Progress Note   06/30/21 1644  Mobility  Activity Ambulated in hall  Level of Assistance Independent  Assistive Device None  Distance Ambulated (ft) 430 ft  Mobility Ambulated independently in hallway  Mobility Response Tolerated well  Mobility performed by Mobility specialist  Bed Position Chair  $Mobility charge 1 Mobility   Pre-Mobility: 106 HR, 92% SpO2 During Mobility: 106 HR, 89% SpO2 Post-Mobility: 102 HR, 84>92% SpO2  Pt with no c/o during ambulation. Pt slightly SOB during ambulation with sats at 89%. Pt coached through purse-lipped breathing to bring sats up, pt asx. Pt back to recliner after walk with call bell and phone at her side. Pt sats at 84% after sitting down but returned to low 90s within roughly 10 seconds, RN notified.   [facility] Evie Croston Mobility Specialist Mobility Specialist Phone: [phone]

## 2021-06-30 NOTE — Evaluation (Signed)
Occupational Therapy Evaluation/Discharge Patient Details Name: [name] Collier MRN: [id number] DOB: 09-26-1946 Today's Date: 06/30/2021    History of Present Illness [name] Collier is a 75 y.o. female who underwent colonoscopy 3 days prior to admission presenting with new L sided abdominal pain. Pt diagnosed with splenic injury, complication from colonscopy. PMH: a fib, L breast cancer s/p lumpectomy, GERD, HTN.   Clinical Impression   PTA, pt lives alone and reports Independence with all daily tasks without use of AD. 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 Pt reports only walking household distances usually at baseline. Pt with no LOB or safety concerns noted. Reports continued mild abdominal discomfort with tasks though this does not interfere with completion. Pt was noted with desat during mobility but quickly recovered (see walking sat note). No further skilled OT services needed at acute level or on DC. OT to sign off.     Follow Up Recommendations  No OT follow up    Equipment Recommendations  None recommended by OT    Recommendations for Other Services       Precautions / Restrictions Precautions Precautions: Fall Precaution Comments: low fall, monitor HR/O2 Restrictions Weight Bearing Restrictions: No      Mobility Bed Mobility Overal bed mobility: Modified Independent             General bed mobility comments: HOB elevated    Transfers Overall transfer level: Independent Equipment used: None                  Balance Overall balance assessment: No apparent balance deficits (not formally assessed)                                         ADL either performed or assessed with clinical judgement   ADL Overall ADL's : Independent                                       General ADL Comments: able to don shoes, gown around back, ambulate in  hallway without AD and reports feeling this is her baseline functioning. Endorses SOB with long distance mobility at home, gait deficits due to B knee arthritis but [facility]     Vision Patient Visual Report: No change from baseline Vision Assessment?: No apparent visual deficits     Perception     Praxis      Pertinent Vitals/Pain Pain Assessment: Faces Faces Pain Scale: Hurts a little bit Pain Location: L abdomen with bending/activity Pain Descriptors / Indicators: Sore;Discomfort Pain Intervention(s): Monitored during session     Hand Dominance Right   Extremity/Trunk Assessment Upper Extremity Assessment Upper Extremity Assessment: Overall WFL for tasks assessed   Lower Extremity Assessment Lower Extremity Assessment: RLE deficits/detail;LLE deficits/detail RLE Deficits / Details: reports B knee arthritis (bone on bone per pt) with likely knee replacements in future LLE Deficits / Details: reports B knee arthritis (bone on bone per pt) with likely knee replacements in future   Cervical / Trunk Assessment Cervical / Trunk Assessment: Normal   Communication Communication Communication: No difficulties   Cognition Arousal/Alertness: Awake/alert Behavior During Therapy: WFL for tasks assessed/performed Overall Cognitive Status: Within Functional Limits for tasks assessed  General Comments: very pleasant   General Comments  SpO2 87% on RA after activity, quickly improved with seated rest break. Reports SOB typically with long distance mobility. A fib, HR 120s with activity    Exercises     Shoulder Instructions      Home Living Family/patient expects to be discharged to:: Private residence Living Arrangements: Alone Available Help at Discharge: Family;Available PRN/intermittently Type of Home: House Home Access: Stairs to enter [employer] of Steps: 2 Entrance Stairs-Rails: None Home Layout: One level      Bathroom Shower/Tub: Walk-in shower;Tub/shower unit   Bathroom Toilet: Handicapped height     Home Equipment: None          Prior Functioning/Environment Level of Independence: Independent        Comments: Independent with ADLs, IADLs, driving and grocery shopping        OT Problem List:        OT Treatment/Interventions:      OT Goals(Current goals can be found in the care plan section) Acute Rehab OT Goals Patient Stated Goal: resolve abdominal pain, return home OT Goal Formulation: All assessment and education complete, DC therapy  OT Frequency:     Barriers to D/C:            Co-evaluation              AM-PAC OT "6 Clicks" Daily Activity     Outcome Measure Help from another person eating meals?: None Help from another person taking care of personal grooming?: None Help from another person toileting, which includes using toliet, bedpan, or urinal?: None Help from another person bathing (including washing, rinsing, drying)?: None Help from another person to put on and taking off regular upper body clothing?: None Help from another person to put on and taking off regular lower body clothing?: None 6 Click Score: 24   End of Session Nurse Communication: Mobility status;Other (comment) (O2)  Activity Tolerance: Patient tolerated treatment well Patient left: in bed;with call bell/phone within reach;with nursing/sitter in room  OT Visit Diagnosis: Pain Pain - Right/Left: Left Pain - part of body:  (abdomen)                Time: [id number] OT Time Calculation (min): 18 min Charges:  OT General Charges $OT Visit: 1 Visit OT Evaluation $OT Eval Low Complexity: 1 Low  Malachy Chamber, OTR/L Acute Rehab Services Office: [phone]   Layla Maw 06/30/2021, 10:09 AM

## 2021-06-30 NOTE — Progress Notes (Signed)
Subjective/Chief Complaint: None Patient is doing well.  She is up walking in her room pleasant in no distress.  Denies abdominal pain.   Objective: Vital signs in last 24 hours: Temp:  [97.6 F (36.4 C)-98.8 F (37.1 C)] 98.2 F (36.8 C) (07/25 0742) Pulse Rate:  [55-85] 75 (07/25 0742) Resp:  [14-24] 22 (07/25 0742) BP: (153-190)/(53-77) 168/73 (07/25 0742) SpO2:  [90 %-97 %] 90 % (07/25 0742) Weight:  [126.6 kg] 126.6 kg (07/24 1106) Last BM Date: 06/28/21  Intake/Output from previous day: 07/24 0701 - 07/25 0700 In: 812.8 [P.O.:100; I.V.:712.8] Out: 200 [Urine:200] Intake/Output this shift: No intake/output data recorded.  General appearance: alert and cooperative Resp: clear to auscultation bilaterally GI: Soft nontender without rebound or guarding.  No peritoneal signs  Lab Results:  Recent Labs    06/29/21 1115 06/29/21 1645  WBC 6.9 6.4  HGB 12.3 11.7*  HCT 38.8 36.6  PLT 238 210   BMET Recent Labs    06/29/21 1115 06/30/21 0109  NA 138 138  K 4.5 3.7  CL 103 104  CO2 26 26  GLUCOSE 108* 120*  BUN 8 <5*  CREATININE 0.66 0.66  CALCIUM 9.1 9.2   PT/INR Recent Labs    06/29/21 1115  LABPROT 19.6*  INR 1.7*   ABG No results for input(s): PHART, HCO3 in the last 72 hours.  Invalid input(s): PCO2, PO2  Studies/Results: DG Chest 2 View  Result Date: 06/29/2021 CLINICAL DATA:  Left upper quadrant abdominal pain and pleuritic pain. Recent colonoscopy. EXAM: CHEST - 2 VIEW COMPARISON:  Chest radiograph 12/16/2016 FINDINGS: Atherosclerotic calcification of the aortic arch. Mild cardiomegaly. Lingular density favoring scarring or atelectasis. No blunting of the costophrenic angles. No obvious indicators of free intraperitoneal gas beneath the hemidiaphragms. Thoracic spondylosis. IMPRESSION: 1. No findings of free intraperitoneal gas beneath the hemidiaphragms. 2. Indistinct lingular density probably from scarring, although a component of  atelectasis is not excluded. 3.  Aortic Atherosclerosis (ICD10-I70.0). 4. Mild enlargement of the cardiopericardial silhouette, without edema. Electronically Signed   By: Van Clines M.D.   On: 06/29/2021 12:08   CT ABDOMEN PELVIS W CONTRAST  Result Date: 06/29/2021 CLINICAL DATA:  Left-sided abdominal pain. Recent colonoscopy with polypectomy. EXAM: CT ABDOMEN AND PELVIS WITH CONTRAST TECHNIQUE: Multidetector CT imaging of the abdomen and pelvis was performed using the standard protocol following bolus administration of intravenous contrast. CONTRAST:  55m OMNIPAQUE IOHEXOL 350 MG/ML SOLN COMPARISON:  05/06/2021 FINDINGS: Lower chest: Stable scarring in the lingula. Stable (from 05/06/2021) 5 mm left lower lobe nodule on image 2 series 4. descending thoracic aortic atherosclerotic vascular calcification. Mild mitral valve calcification. Hepatobiliary: Unremarkable Pancreas: Unremarkable Spleen: Complex fluid along the left paracolic gutter tangential to the anterior margin of the spleen but also tangential to the proximal descending colon. No hypoenhancing segment of the spleen to suggest splenic infarct. No sharply defined splenic laceration. Adrenals/Urinary Tract: Unremarkable, prior right perinephric stranding has resolved. Stomach/Bowel: There is abnormal complex fluid in the left paracolic gutter adjacent to the proximal descending colon, volume at this location estimated at about 15 cc. There is also a small amount of complex fluid favoring blood products in the pelvis, volume approximately 30 cc. Currently there is no free intraperitoneal gas or definite extraluminal gas or pneumatosis identified. No dilated bowel Vascular/Lymphatic: Aortoiliac atherosclerotic vascular disease. A left periaortic lymph node on image 35 of series 2 is borderline prominent at 1.0 cm in short axis. Low-density mesenteric lymph node on image 39  series 2 measures 1.0 cm in short axis, borderline prominent as well.  Reproductive: Uterus absent.  Adnexa unremarkable. Other: No supplemental non-categorized findings. Musculoskeletal: Mild sclerosis along the SI joints suggesting osteitis pubis. Lumbar spondylosis and degenerative disc disease causing mild degrees of impingement at L3-4, L4-5, and L5-S1. IMPRESSION: 1. There is about 15 cc of hemoperitoneum interposed between the anterior spleen and the proximal descending colon, and about 30 cc of hemoperitoneum in the cul-de-sac, in this patient with recent colonoscopy. No free intraperitoneal gas or extraluminal gas. Possibilities in this clinical scenario include either a bowel injury leading to intraperitoneal bleeding but without extraluminal gas; or a small anterior splenic laceration/injury which can be caused by traction on the splenocolic ligament. No compelling findings of active extravasation. Close clinical surveillance and careful management of the patient's anticoagulation in the context of this injury is recommended. 2. Borderline enlarged single retroperitoneal and single mesenteric lymph nodes. 3. Stable 5 mm left lower lobe nodule. No follow-up needed if patient is low-risk. Non-contrast chest CT can be considered in 12 months if patient is high-risk. This recommendation follows the consensus statement: Guidelines for Management of Incidental Pulmonary Nodules Detected on CT Images: From the Fleischner Society 2017; Radiology 2017; 284:228-243. 4. Aortic Atherosclerosis (ICD10-I70.0). Mild mitral valve calcification. 5. Mild lower lumbar impingement. I discussed these findings by telephone with Dr. Sherry Ruffing at approximately 2:45 p.m. on 06/29/2021. Electronically Signed   By: Van Clines M.D.   On: 06/29/2021 14:48    Anti-infectives: Anti-infectives (From admission, onward)    None       Assessment/Plan: 75 yo female 3 days out from colonoscopy with a 36 h hold of Xarelto who has blood around her spleen. Vitals normal. No LH or fatigue -hold  xarelto and other blood thinning products   Minimal drop in hemoglobin.  Continue to monitor.  Okay to advance diet at this point.  If she has a significant drop, recommend IR consultation for embolization as initial first step.   LOS: 1 day    Joyice Faster Manda Holstad 06/30/2021

## 2021-06-30 NOTE — Progress Notes (Signed)
PROGRESS NOTE    [name] Collier  [id number] DOB: Oct 13, 1946 DOA: 06/29/2021 PCP: [name], DO    No chief complaint on file.   Brief Narrative:  [name] Collier is a 75 y.o. female with medical history significant of a.fib on xarelto, left breast Ca sp lumpectomy, GERD, HTN , HLD, TIA Presented with   new left sided chest and abdominal pain. She underwent colonoscopy with a 36 h hold of Xarelto who has blood around her spleen.  Assessment & Plan:   Active Problems:   Essential hypertension   Persistent atrial fibrillation   Carcinoma of upper-inner quadrant of left breast in female, estrogen receptor positive ([place])   Spleen injury   Spleen injury Post colonoscopy.  Minimal drop in hemoglobin.  Advance diet as tolerated.    Persistent A FIB.  Rate controlled. Holding xarelto for now.    Hypertension:  Sub optimally controlled. Prn hydralazine will be ordered.       DVT prophylaxis: scd's Code Status: FULL CODE.  Family Communication: none at bedside.  Disposition:   Status is: Inpatient  Remains inpatient appropriate because:Inpatient level of care appropriate due to severity of illness  Dispo: The patient is from: Home              Anticipated d/c is to: Home              Patient currently is not medically stable to d/c.   Difficult to place patient No       Consultants:  GI Surgery.   Procedures: none.   Antimicrobials: none.    Subjective: Some pain but better than yesterday.  Nauseated. No vomiting.  Able to tolerate clears today.   Objective: Vitals:   06/29/21 2001 06/29/21 2312 06/30/21 0318 06/30/21 0742  BP: (!) 157/56 (!) 160/62 (!) 158/63 (!) 168/73  Pulse: 76 85 71 75  Resp: '16 20 20 '$ (!) 22  Temp: 98.2 F (36.8 C) 98.4 F (36.9 C) 98.7 F (37.1 C) 98.2 F (36.8 C)  TempSrc: Oral Oral Oral Oral  SpO2: 94% 92% 93% 90%  Weight:      Height:        Intake/Output Summary (Last 24 hours) at 06/30/2021 1705 Last  data filed at 06/30/2021 0603 Gross per 24 hour  Intake 812.77 ml  Output --  Net 812.77 ml   Filed Weights   06/29/21 1106  Weight: 126.6 kg    Examination:  General exam: Appears calm and comfortable  Respiratory system: Clear to auscultation. Respiratory effort normal. Cardiovascular system: S1 & S2 heard, RRR. No JVD,  No pedal edema. Gastrointestinal system: Abdomen is nondistended, soft and nontender.  Normal bowel sounds heard. Central nervous system: Alert and oriented. No focal neurological deficits. Extremities: Symmetric 5 x 5 power. Skin: No rashes, lesions or ulcers Psychiatry: JMood & affect appropriate.     Data Reviewed: I have personally reviewed following labs and imaging studies  CBC: Recent Labs  Lab 06/29/21 1115 06/29/21 1645 06/30/21 0926  WBC 6.9 6.4 6.2  NEUTROABS 5.0  --   --   HGB 12.3 11.7* 12.2  HCT 38.8 36.6 38.2  MCV 90.0 89.9 90.7  PLT 238 210 [id number]    Basic Metabolic Panel: Recent Labs  Lab 06/29/21 1115 06/30/21 0109  NA 138 138  K 4.5 3.7  CL 103 104  CO2 26 26  GLUCOSE 108* 120*  BUN 8 <5*  CREATININE 0.66 0.66  CALCIUM 9.1 9.2  MG  --  1.8  PHOS  --  4.1    GFR: Estimated Creatinine Clearance: 84 mL/min (by C-G formula based on SCr of 0.66 mg/dL).  Liver Function Tests: Recent Labs  Lab 06/29/21 1123 06/30/21 0109  AST 19 16  ALT 13 15  ALKPHOS 72 70  BILITOT 1.5* 1.8*  PROT 6.8 6.7  ALBUMIN 3.9 3.2*    CBG: No results for input(s): GLUCAP in the last 168 hours.   Recent Results (from the past 240 hour(s))  Urine Culture     Status: Abnormal   Collection Time: 06/29/21 12:50 PM   Specimen: Urine, Clean Catch  Result Value Ref Range Status   Specimen Description   Final    URINE, CLEAN CATCH Performed at [facility], [address], [place], [place] [address]    Special Requests   Final    NONE Performed at [facility], [address] [place], [place]  [address]    Culture (A)  Final    <10,000 COLONIES/mL INSIGNIFICANT GROWTH Performed at [facility] [address], [place],  [address]    Report Status 06/30/2021 FINAL  Final  Resp Panel by RT-PCR (Flu A&B, Covid) Nasopharyngeal Swab     Status: None   Collection Time: 06/29/21  1:00 PM   Specimen: Nasopharyngeal Swab; Nasopharyngeal(NP) swabs in vial transport medium  Result Value Ref Range Status   SARS Coronavirus 2 by RT PCR NEGATIVE NEGATIVE Final    Comment: (NOTE) SARS-CoV-2 target nucleic acids are NOT DETECTED.  The SARS-CoV-2 RNA is generally detectable in upper respiratory specimens during the acute phase of infection. The lowest concentration of SARS-CoV-2 viral copies this assay can detect is 138 copies/mL. A negative result does not preclude SARS-Cov-2 infection and should not be used as the sole basis for treatment or other patient management decisions. A negative result may occur with  improper specimen collection/handling, submission of specimen other than nasopharyngeal swab, presence of viral mutation(s) within the areas targeted by this assay, and inadequate number of viral copies(<138 copies/mL). A negative result must be combined with clinical observations, patient history, and epidemiological information. The expected result is Negative.  Fact Sheet for Patients:  EntrepreneurPulse.com.au  Fact Sheet for Healthcare Providers:  IncredibleEmployment.be  This test is no t yet approved or cleared by the [place] FDA and  has been authorized for detection and/or diagnosis of SARS-CoV-2 by FDA under an Emergency Use Authorization (EUA). This EUA will remain  in effect (meaning this test can be used) for the duration of the COVID-19 declaration under Section 564(b)(1) of the Act, 21 U.S.C.section 360bbb-3(b)(1), unless the authorization is terminated  or revoked sooner.       Influenza A by PCR NEGATIVE  NEGATIVE Final   Influenza B by PCR NEGATIVE NEGATIVE Final    Comment: (NOTE) The Xpert Xpress SARS-CoV-2/FLU/RSV plus assay is intended as an aid in the diagnosis of influenza from Nasopharyngeal swab specimens and should not be used as a sole basis for treatment. Nasal washings and aspirates are unacceptable for Xpert Xpress SARS-CoV-2/FLU/RSV testing.  Fact Sheet for Patients: EntrepreneurPulse.com.au  Fact Sheet for Healthcare Providers: IncredibleEmployment.be  This test is not yet approved or cleared by the [place] FDA and has been authorized for detection and/or diagnosis of SARS-CoV-2 by FDA under an Emergency Use Authorization (EUA). This EUA will remain in effect (meaning this test can be used) for the duration of the COVID-19 declaration under Section 564(b)(1) of the Act, 21 U.S.C.  section 360bbb-3(b)(1), unless the authorization is terminated or revoked.  Performed at [facility], [address] [place], [place] [address]          Radiology Studies: DG Chest 2 View  Result Date: 06/29/2021 CLINICAL DATA:  Left upper quadrant abdominal pain and pleuritic pain. Recent colonoscopy. EXAM: CHEST - 2 VIEW COMPARISON:  Chest radiograph 12/16/2016 FINDINGS: Atherosclerotic calcification of the aortic arch. Mild cardiomegaly. Lingular density favoring scarring or atelectasis. No blunting of the costophrenic angles. No obvious indicators of free intraperitoneal gas beneath the hemidiaphragms. Thoracic spondylosis. IMPRESSION: 1. No findings of free intraperitoneal gas beneath the hemidiaphragms. 2. Indistinct lingular density probably from scarring, although a component of atelectasis is not excluded. 3.  Aortic Atherosclerosis (ICD10-I70.0). 4. Mild enlargement of the cardiopericardial silhouette, without edema. Electronically Signed   By: Van Clines M.D.   On: 06/29/2021 12:08   CT ABDOMEN PELVIS W  CONTRAST  Result Date: 06/29/2021 CLINICAL DATA:  Left-sided abdominal pain. Recent colonoscopy with polypectomy. EXAM: CT ABDOMEN AND PELVIS WITH CONTRAST TECHNIQUE: Multidetector CT imaging of the abdomen and pelvis was performed using the standard protocol following bolus administration of intravenous contrast. CONTRAST:  51m OMNIPAQUE IOHEXOL 350 MG/ML SOLN COMPARISON:  05/06/2021 FINDINGS: Lower chest: Stable scarring in the lingula. Stable (from 05/06/2021) 5 mm left lower lobe nodule on image 2 series 4. descending thoracic aortic atherosclerotic vascular calcification. Mild mitral valve calcification. Hepatobiliary: Unremarkable Pancreas: Unremarkable Spleen: Complex fluid along the left paracolic gutter tangential to the anterior margin of the spleen but also tangential to the proximal descending colon. No hypoenhancing segment of the spleen to suggest splenic infarct. No sharply defined splenic laceration. Adrenals/Urinary Tract: Unremarkable, prior right perinephric stranding has resolved. Stomach/Bowel: There is abnormal complex fluid in the left paracolic gutter adjacent to the proximal descending colon, volume at this location estimated at about 15 cc. There is also a small amount of complex fluid favoring blood products in the pelvis, volume approximately 30 cc. Currently there is no free intraperitoneal gas or definite extraluminal gas or pneumatosis identified. No dilated bowel Vascular/Lymphatic: Aortoiliac atherosclerotic vascular disease. A left periaortic lymph node on image 35 of series 2 is borderline prominent at 1.0 cm in short axis. Low-density mesenteric lymph node on image 39 series 2 measures 1.0 cm in short axis, borderline prominent as well. Reproductive: Uterus absent.  Adnexa unremarkable. Other: No supplemental non-categorized findings. Musculoskeletal: Mild sclerosis along the SI joints suggesting osteitis pubis. Lumbar spondylosis and degenerative disc disease causing mild  degrees of impingement at L3-4, L4-5, and L5-S1. IMPRESSION: 1. There is about 15 cc of hemoperitoneum interposed between the anterior spleen and the proximal descending colon, and about 30 cc of hemoperitoneum in the cul-de-sac, in this patient with recent colonoscopy. No free intraperitoneal gas or extraluminal gas. Possibilities in this clinical scenario include either a bowel injury leading to intraperitoneal bleeding but without extraluminal gas; or a small anterior splenic laceration/injury which can be caused by traction on the splenocolic ligament. No compelling findings of active extravasation. Close clinical surveillance and careful management of the patient's anticoagulation in the context of this injury is recommended. 2. Borderline enlarged single retroperitoneal and single mesenteric lymph nodes. 3. Stable 5 mm left lower lobe nodule. No follow-up needed if patient is low-risk. Non-contrast chest CT can be considered in 12 months if patient is high-risk. This recommendation follows the consensus statement: Guidelines for Management of Incidental Pulmonary Nodules Detected on CT Images: From the Fleischner Society 2017; Radiology  [id number]. 4. Aortic Atherosclerosis (ICD10-I70.0). Mild mitral valve calcification. 5. Mild lower lumbar impingement. I discussed these findings by telephone with Dr. Sherry Ruffing at approximately 2:45 p.m. on 06/29/2021. Electronically Signed   By: Van Clines M.D.   On: 06/29/2021 14:48        Scheduled Meds:  amLODipine  5 mg Oral Daily   anastrozole  1 mg Oral Daily   dofetilide  500 mcg Oral BID   DULoxetine  30 mg Oral Daily   ezetimibe  10 mg Oral Daily   Continuous Infusions:   LOS: 1 day        Hosie Poisson, MD [facility]   To contact the attending provider between 7A-7P or the covering provider during after hours 7P-7A, please log into the web site [email or web address] and access using universal [place] password for that web  site. If you do not have the password, please call the hospital operator.  06/30/2021, 5:05 PM

## 2021-07-01 ENCOUNTER — Telehealth: Payer: Self-pay

## 2021-07-01 DIAGNOSIS — D123 Benign neoplasm of transverse colon: Secondary | ICD-10-CM | POA: Diagnosis not present

## 2021-07-01 LAB — CBC
HCT: 37 % (ref 36.0–46.0)
Hemoglobin: 12 g/dL (ref 12.0–15.0)
MCH: 29.6 pg (ref 26.0–34.0)
MCHC: 32.4 g/dL (ref 30.0–36.0)
MCV: 91.4 fL (ref 80.0–100.0)
Platelets: 235 10*3/uL (ref 150–400)
RBC: 4.05 MIL/uL (ref 3.87–5.11)
RDW: 13.2 % (ref 11.5–15.5)
WBC: 5.3 10*3/uL (ref 4.0–10.5)
nRBC: 0 % (ref 0.0–0.2)

## 2021-07-01 LAB — BASIC METABOLIC PANEL
Anion gap: 11 (ref 5–15)
BUN: 9 mg/dL (ref 8–23)
CO2: 25 mmol/L (ref 22–32)
Calcium: 8.9 mg/dL (ref 8.9–10.3)
Chloride: 104 mmol/L (ref 98–111)
Creatinine, Ser: 0.7 mg/dL (ref 0.44–1.00)
GFR, Estimated: >60 mL/min (ref >60)
Glucose, Bld: 107 mg/dL — ABNORMAL HIGH (ref 70–99)
Potassium: 4.2 mmol/L (ref 3.5–5.1)
Sodium: 140 mmol/L (ref 135–145)

## 2021-07-01 LAB — MAGNESIUM: Magnesium: 2.1 mg/dL (ref 1.7–2.4)

## 2021-07-01 MED ORDER — RIVAROXABAN 20 MG PO TABS: 20.0000 mg | ORAL_TABLET | Freq: Every day | ORAL | 1 refills | Status: DC

## 2021-07-01 NOTE — Progress Notes (Signed)
Pt discharged in stable condition having received and verbalized understanding of teaching. Pt left with all belongings and was escorted out by this RN.

## 2021-07-01 NOTE — Telephone Encounter (Signed)
Transition Care Management Follow-up Telephone Call Date of discharge and from where: [facility] 07[facility]01[facility]21 Diagnosis: left side chest and abdominal pain, post colonoscopy How have you been since you were released from the hospital? Much better Any questions or concerns? No  Items Reviewed: Did the pt receive and understand the discharge instructions provided? Yes  Medications obtained and verified? Yes  Other? No  Any new allergies since your discharge? No  Dietary orders reviewed? Yes Do you have support at home? Yes   Home Care and Equipment/Supplies: Were home health services ordered? no If so, what is the name of the agency? N/a  Has the agency set up a time to come to the patient's home? not applicable Were any new equipment or medical supplies ordered?  No What is the name of the medical supply agency? N/a Were you able to get the supplies/equipment? not applicable Do you have any questions related to the use of the equipment or supplies? No  Functional Questionnaire: (I = Independent and D = Dependent) ADLs: I  Bathing/Dressing- I  Meal Prep- I  Eating- I  Maintaining continence- I  Transferring/Ambulation- I  Managing Meds- I  Follow up appointments reviewed:  PCP Hospital f/u appt confirmed? Yes  Scheduled to see Onyeje on 7/29 @ 11. [facility] f/u appt confirmed? Yes  Scheduled to see Hochrein in August  Are transportation arrangements needed? No  If their condition worsens, is the pt aware to call PCP or go to the Emergency Dept.? Yes Was the patient provided with contact information for the PCP's office or ED? Yes Was to pt encouraged to call back with questions or concerns? Yes

## 2021-07-01 NOTE — Progress Notes (Signed)
Progress Note     Subjective: Patient reports mild LUQ pain that is exacerbated by deep inspiration. She is tolerating FLD without nausea or vomiting. She reports having a BM yesterday AM.   Objective: Vital signs in last 24 hours: Temp:  [98.1 F (36.7 C)-98.4 F (36.9 C)] 98.2 F (36.8 C) (07/26 0736) Pulse Rate:  [70-77] 71 (07/26 0736) Resp:  [13-20] 17 (07/26 0736) BP: (113-164)/(47-77) 164/47 (07/26 0736) SpO2:  [92 %-100 %] 93 % (07/26 0736) Last BM Date: 06/30/21  Intake/Output from previous day: 07/25 0701 - 07/26 0700 In: 360 [P.O.:360] Out: -  Intake/Output this shift: No intake/output data recorded.  PE: General: pleasant, WD, obese female who is sitting up in chair Heart: regular, rate, and rhythm.  Normal s1,s2. No obvious murmurs, gallops, or rubs noted.  Palpable radial and pedal pulses bilaterally Lungs: CTAB, no wheezes, rhonchi, or rales noted.  Respiratory effort nonlabored Abd: soft, mild ttp in LUQ without peritonitis or guarding, ND Psych: A&Ox3 with an appropriate affect.    Lab Results:  Recent Labs    06/30/21 0926 07/01/21 0708  WBC 6.2 5.3  HGB 12.2 12.0  HCT 38.2 37.0  PLT 217 235   BMET Recent Labs    06/29/21 1115 06/30/21 0109  NA 138 138  K 4.5 3.7  CL 103 104  CO2 26 26  GLUCOSE 108* 120*  BUN 8 <5*  CREATININE 0.66 0.66  CALCIUM 9.1 9.2   PT/INR Recent Labs    06/29/21 1115  LABPROT 19.6*  INR 1.7*   CMP     Component Value Date/Time   NA 138 06/30/2021 0109   NA 144 05/30/2021 0930   K 3.7 06/30/2021 0109   CL 104 06/30/2021 0109   CO2 26 06/30/2021 0109   GLUCOSE 120 (H) 06/30/2021 0109   BUN <5 (L) 06/30/2021 0109   BUN 15 05/30/2021 0930   CREATININE 0.66 06/30/2021 0109   CALCIUM 9.2 06/30/2021 0109   PROT 6.7 06/30/2021 0109   PROT 6.9 05/30/2021 0930   ALBUMIN 3.2 (L) 06/30/2021 0109   ALBUMIN 4.0 05/30/2021 0930   AST 16 06/30/2021 0109   ALT 15 06/30/2021 0109   ALKPHOS 70 06/30/2021  0109   BILITOT 1.8 (H) 06/30/2021 0109   BILITOT 1.1 05/30/2021 0930   GFRNONAA >60 06/30/2021 0109   GFRAA 80 11/27/2020 0828   Lipase     Component Value Date/Time   LIPASE 11 06/29/2021 1123       Studies/Results: DG Chest 2 View  Result Date: 06/29/2021 CLINICAL DATA:  Left upper quadrant abdominal pain and pleuritic pain. Recent colonoscopy. EXAM: CHEST - 2 VIEW COMPARISON:  Chest radiograph 12/16/2016 FINDINGS: Atherosclerotic calcification of the aortic arch. Mild cardiomegaly. Lingular density favoring scarring or atelectasis. No blunting of the costophrenic angles. No obvious indicators of free intraperitoneal gas beneath the hemidiaphragms. Thoracic spondylosis. IMPRESSION: 1. No findings of free intraperitoneal gas beneath the hemidiaphragms. 2. Indistinct lingular density probably from scarring, although a component of atelectasis is not excluded. 3.  Aortic Atherosclerosis (ICD10-I70.0). 4. Mild enlargement of the cardiopericardial silhouette, without edema. Electronically Signed   By: Van Clines M.D.   On: 06/29/2021 12:08   CT ABDOMEN PELVIS W CONTRAST  Result Date: 06/29/2021 CLINICAL DATA:  Left-sided abdominal pain. Recent colonoscopy with polypectomy. EXAM: CT ABDOMEN AND PELVIS WITH CONTRAST TECHNIQUE: Multidetector CT imaging of the abdomen and pelvis was performed using the standard protocol following bolus administration of intravenous contrast. CONTRAST:  75m OMNIPAQUE IOHEXOL 350 MG/ML SOLN COMPARISON:  05/06/2021 FINDINGS: Lower chest: Stable scarring in the lingula. Stable (from 05/06/2021) 5 mm left lower lobe nodule on image 2 series 4. descending thoracic aortic atherosclerotic vascular calcification. Mild mitral valve calcification. Hepatobiliary: Unremarkable Pancreas: Unremarkable Spleen: Complex fluid along the left paracolic gutter tangential to the anterior margin of the spleen but also tangential to the proximal descending colon. No hypoenhancing  segment of the spleen to suggest splenic infarct. No sharply defined splenic laceration. Adrenals/Urinary Tract: Unremarkable, prior right perinephric stranding has resolved. Stomach/Bowel: There is abnormal complex fluid in the left paracolic gutter adjacent to the proximal descending colon, volume at this location estimated at about 15 cc. There is also a small amount of complex fluid favoring blood products in the pelvis, volume approximately 30 cc. Currently there is no free intraperitoneal gas or definite extraluminal gas or pneumatosis identified. No dilated bowel Vascular/Lymphatic: Aortoiliac atherosclerotic vascular disease. A left periaortic lymph node on image 35 of series 2 is borderline prominent at 1.0 cm in short axis. Low-density mesenteric lymph node on image 39 series 2 measures 1.0 cm in short axis, borderline prominent as well. Reproductive: Uterus absent.  Adnexa unremarkable. Other: No supplemental non-categorized findings. Musculoskeletal: Mild sclerosis along the SI joints suggesting osteitis pubis. Lumbar spondylosis and degenerative disc disease causing mild degrees of impingement at L3-4, L4-5, and L5-S1. IMPRESSION: 1. There is about 15 cc of hemoperitoneum interposed between the anterior spleen and the proximal descending colon, and about 30 cc of hemoperitoneum in the cul-de-sac, in this patient with recent colonoscopy. No free intraperitoneal gas or extraluminal gas. Possibilities in this clinical scenario include either a bowel injury leading to intraperitoneal bleeding but without extraluminal gas; or a small anterior splenic laceration/injury which can be caused by traction on the splenocolic ligament. No compelling findings of active extravasation. Close clinical surveillance and careful management of the patient's anticoagulation in the context of this injury is recommended. 2. Borderline enlarged single retroperitoneal and single mesenteric lymph nodes. 3. Stable 5 mm left lower  lobe nodule. No follow-up needed if patient is low-risk. Non-contrast chest CT can be considered in 12 months if patient is high-risk. This recommendation follows the consensus statement: Guidelines for Management of Incidental Pulmonary Nodules Detected on CT Images: From the Fleischner Society 2017; Radiology 2017; 284:228-243. 4. Aortic Atherosclerosis (ICD10-I70.0). Mild mitral valve calcification. 5. Mild lower lumbar impingement. I discussed these findings by telephone with Dr. TSherry Ruffingat approximately 2:45 p.m. on 06/29/2021. Electronically Signed   By: WVan ClinesM.D.   On: 06/29/2021 14:48    Anti-infectives: Anti-infectives (From admission, onward)    None        Assessment/Plan S/p colonoscopy 4 days ago with 36h hold of xarelto  Splenic injury - hgb stable at 12.0 - some soreness in LUQ which is to be expected - tolerating FLD and having bowel function - advance to reg diet - stable for discharge from a surgical standpoint and no surgical intervention indicated - if concern for rebleeding would recommend IR consult for possible embo - recommend OP follow up with PCP to recheck CBC 1 week from discharge - recommend holding DOAC 5-7 days from injury  FEN: reg diet  VTE: SCDs ID: no abx recommended at this time  - below per primary -  HTN HLD Breast CA on arimidex Hx of TIA Atrial fibrillation on xarelto  Morbid obesity - BMI 45.03  LOS: 2 days    KNorm Parcel PA-C  [facility] 07/01/2021, 9:49 AM Please see Amion for pager number during day hours 7:00am-4:30pm

## 2021-07-01 NOTE — Discharge Summary (Signed)
Physician Discharge Summary  [name] Collier [id number] DOB: 06/18/1946 DOA: 06/29/2021  PCP: [name], DO  Admit date: 06/29/2021 Discharge date: 07/01/2021  Admitted From: Home.  Disposition:  Home.   Recommendations for Outpatient Follow-up:  Follow up with PCP on Friday.  Please obtain BMP/CBC on Friday.  Please follow up cardiology as scheduled.   Discharge Condition: stable.  CODE STATUS: Full code.  Diet recommendation: Heart Healthy  Brief/Interim Summary:  GERARD Collier is a 75 y.o. female with medical history significant of a.fib on xarelto, left breast Ca sp lumpectomy, GERD, HTN , HLD, TIA Presented with   new left sided chest and abdominal pain. She underwent colonoscopy with a 36 h hold of Xarelto who has blood around her spleen Discharge Diagnoses:  Active Problems:   Essential hypertension   Persistent atrial fibrillation   Carcinoma of upper-inner quadrant of left breast in female, estrogen receptor positive ([place])   Spleen injury  Spleen injury Post colonoscopy. Minimal drop in hemoglobin. Her last colonoscopy was on Thursday, will hold the xarelto till Thursday, asked the patient to get her CBC checked on Friday and is stable can either restart Xarelto or change to eliquis as per cardiology/ Afib clinic recommendations.  Advance diet as tolerated.     Persistent A FIB. Rate controlled. Holding Xarelto till Thursday.      Hypertension: Sub optimally controlled. Restarted home meds on discharge.       Discharge Instructions  Discharge Instructions     Diet - low sodium heart healthy   Complete by: As directed    Discharge instructions   Complete by: As directed    Please follow up with PCP on Friday, check your CBC and if hemoglobin is stable, restart xarelto or change to Eliquis as per your cardiology recommendations.      Allergies as of 07/01/2021       Reactions   Augmentin [amoxicillin-pot Clavulanate] Other (See Comments)    c-diff Has patient had a PCN reaction causing immediate rash, facial/tongue/throat swelling, SOB or lightheadedness with hypotension: No Has patient had a PCN reaction causing severe rash involving mucus membranes or skin necrosis: No Has patient had a PCN reaction that required hospitalization: No Has patient had a PCN reaction occurring within the last 10 years: Yes If all of the above answers are "NO", then may proceed with Cephalosporin use.   Codeine Palpitations, Rash   Prednisone Other (See Comments)   Jittery, red in the face        Medication List     TAKE these medications    acetaminophen 500 MG tablet Commonly known as: TYLENOL Take 1,000 mg by mouth every 6 (six) hours as needed for moderate pain or headache.   amLODipine 5 MG tablet Commonly known as: NORVASC TAKE 1 AND 1/2 TABLETS BY  MOUTH DAILY What changed:  how much to take when to take this   anastrozole 1 MG tablet Commonly known as: ARIMIDEX Take 1 tablet (1 mg total) by mouth daily. What changed: when to take this   CALCIUM 1200 PO Take 1,200 mg by mouth every morning.   dofetilide 500 MCG capsule Commonly known as: TIKOSYN TAKE 1 CAPSULE BY MOUTH 2 TIMES DAILY. What changed: additional instructions   DULoxetine 30 MG capsule Commonly known as: CYMBALTA TAKE 1 CAPSULE BY MOUTH  DAILY What changed:  when to take this additional instructions   ezetimibe 10 MG tablet Commonly known as: ZETIA TAKE 1 TABLET BY MOUTH  DAILY What changed: when to take this   famotidine 20 MG tablet Commonly known as: PEPCID TAKE 1 TABLET BY MOUTH  TWICE DAILY What changed: when to take this   furosemide 20 MG tablet Commonly known as: LASIX TAKE 1 TABLET BY MOUTH  DAILY What changed: when to take this   olmesartan 5 MG tablet Commonly known as: BENICAR TAKE 1 TABLET BY MOUTH  DAILY What changed: when to take this   potassium chloride SA 20 MEQ tablet Commonly known as: KLOR-CON TAKE 1 TABLET BY  MOUTH  TWICE DAILY What changed: when to take this   rivaroxaban 20 MG Tabs tablet Commonly known as: Xarelto Take 1 tablet (20 mg total) by mouth daily with supper. Start taking on: July 04, 2021 What changed:  how much to take These instructions start on July 04, 2021. If you are unsure what to do until then, ask your doctor or other care provider.   Vitamin D3 125 MCG (5000 UT) Caps Take 5,000 Units by mouth daily with supper.        Follow-up Information     [name], DO Follow up on 07/04/2021.   Specialty: Family Medicine Contact information: [place] [address] [phone]         Minus Breeding, MD .   Specialty: Cardiology Contact information: [address] [place] [address] [phone]         Thompson Grayer, MD .   Specialty: Cardiology Contact information: [address] [place] [address] [phone]                Allergies  Allergen Reactions   Augmentin [Amoxicillin-Pot Clavulanate] Other (See Comments)    c-diff Has patient had a PCN reaction causing immediate rash, facial/tongue/throat swelling, SOB or lightheadedness with hypotension: No Has patient had a PCN reaction causing severe rash involving mucus membranes or skin necrosis: No Has patient had a PCN reaction that required hospitalization: No Has patient had a PCN reaction occurring within the last 10 years: Yes If all of the above answers are "NO", then may proceed with Cephalosporin use.    Codeine Palpitations and Rash   Prednisone Other (See Comments)    Jittery, red in the face    Consultations: Gen surgery.  Gastroenterology.    Procedures/Studies: DG Chest 2 View  Result Date: 06/29/2021 CLINICAL DATA:  Left upper quadrant abdominal pain and pleuritic pain. Recent colonoscopy. EXAM: CHEST - 2 VIEW COMPARISON:  Chest radiograph 12/16/2016 FINDINGS: Atherosclerotic calcification of the aortic arch. Mild  cardiomegaly. Lingular density favoring scarring or atelectasis. No blunting of the costophrenic angles. No obvious indicators of free intraperitoneal gas beneath the hemidiaphragms. Thoracic spondylosis. IMPRESSION: 1. No findings of free intraperitoneal gas beneath the hemidiaphragms. 2. Indistinct lingular density probably from scarring, although a component of atelectasis is not excluded. 3.  Aortic Atherosclerosis (ICD10-I70.0). 4. Mild enlargement of the cardiopericardial silhouette, without edema. Electronically Signed   By: Van Clines M.D.   On: 06/29/2021 12:08   CT ABDOMEN PELVIS W CONTRAST  Result Date: 06/29/2021 CLINICAL DATA:  Left-sided abdominal pain. Recent colonoscopy with polypectomy. EXAM: CT ABDOMEN AND PELVIS WITH CONTRAST TECHNIQUE: Multidetector CT imaging of the abdomen and pelvis was performed using the standard protocol following bolus administration of intravenous contrast. CONTRAST:  11m OMNIPAQUE IOHEXOL 350 MG/ML SOLN COMPARISON:  05/06/2021 FINDINGS: Lower chest: Stable scarring in the lingula. Stable (from 05/06/2021) 5 mm left lower lobe nodule  on image 2 series 4. descending thoracic aortic atherosclerotic vascular calcification. Mild mitral valve calcification. Hepatobiliary: Unremarkable Pancreas: Unremarkable Spleen: Complex fluid along the left paracolic gutter tangential to the anterior margin of the spleen but also tangential to the proximal descending colon. No hypoenhancing segment of the spleen to suggest splenic infarct. No sharply defined splenic laceration. Adrenals/Urinary Tract: Unremarkable, prior right perinephric stranding has resolved. Stomach/Bowel: There is abnormal complex fluid in the left paracolic gutter adjacent to the proximal descending colon, volume at this location estimated at about 15 cc. There is also a small amount of complex fluid favoring blood products in the pelvis, volume approximately 30 cc. Currently there is no free  intraperitoneal gas or definite extraluminal gas or pneumatosis identified. No dilated bowel Vascular/Lymphatic: Aortoiliac atherosclerotic vascular disease. A left periaortic lymph node on image 35 of series 2 is borderline prominent at 1.0 cm in short axis. Low-density mesenteric lymph node on image 39 series 2 measures 1.0 cm in short axis, borderline prominent as well. Reproductive: Uterus absent.  Adnexa unremarkable. Other: No supplemental non-categorized findings. Musculoskeletal: Mild sclerosis along the SI joints suggesting osteitis pubis. Lumbar spondylosis and degenerative disc disease causing mild degrees of impingement at L3-4, L4-5, and L5-S1. IMPRESSION: 1. There is about 15 cc of hemoperitoneum interposed between the anterior spleen and the proximal descending colon, and about 30 cc of hemoperitoneum in the cul-de-sac, in this patient with recent colonoscopy. No free intraperitoneal gas or extraluminal gas. Possibilities in this clinical scenario include either a bowel injury leading to intraperitoneal bleeding but without extraluminal gas; or a small anterior splenic laceration/injury which can be caused by traction on the splenocolic ligament. No compelling findings of active extravasation. Close clinical surveillance and careful management of the patient's anticoagulation in the context of this injury is recommended. 2. Borderline enlarged single retroperitoneal and single mesenteric lymph nodes. 3. Stable 5 mm left lower lobe nodule. No follow-up needed if patient is low-risk. Non-contrast chest CT can be considered in 12 months if patient is high-risk. This recommendation follows the consensus statement: Guidelines for Management of Incidental Pulmonary Nodules Detected on CT Images: From the Fleischner Society 2017; Radiology 2017; 284:228-243. 4. Aortic Atherosclerosis (ICD10-I70.0). Mild mitral valve calcification. 5. Mild lower lumbar impingement. I discussed these findings by telephone with  Dr. Sherry Ruffing at approximately 2:45 p.m. on 06/29/2021. Electronically Signed   By: Van Clines M.D.   On: 06/29/2021 14:48      Subjective:no new complaints.   Discharge Exam: Vitals:   07/01/21 0652 07/01/21 0736  BP: (!) 152/54 (!) 164/47  Pulse: 73 71  Resp: 18 17  Temp: 98.1 F (36.7 C) 98.2 F (36.8 C)  SpO2: 98% 93%   Vitals:   06/30/21 2007 07/01/21 0042 07/01/21 0652 07/01/21 0736  BP: (!) 156/62 (!) 139/56 (!) 152/54 (!) 164/47  Pulse: 77 70 73 71  Resp: '19 20 18 17  '$ Temp: 98.4 F (36.9 C) 98.2 F (36.8 C) 98.1 F (36.7 C) 98.2 F (36.8 C)  TempSrc: Oral Oral Oral Oral  SpO2: 98% 100% 98% 93%  Weight:      Height:        General: Pt is alert, awake, not in acute distress Cardiovascular: RRR, S1/S2 +, no rubs, no gallops Respiratory: CTA bilaterally, no wheezing, no rhonchi Abdominal: Soft, NT, ND, bowel sounds + Extremities: no edema, no cyanosis    The results of significant diagnostics from this hospitalization (including imaging, microbiology, ancillary and laboratory) are listed below for  reference.     Microbiology: Recent Results (from the past 240 hour(s))  Urine Culture     Status: Abnormal   Collection Time: 06/29/21 12:50 PM   Specimen: Urine, Clean Catch  Result Value Ref Range Status   Specimen Description   Final    URINE, CLEAN CATCH Performed at [facility], [address], [place], [place] [address]    Special Requests   Final    NONE Performed at [facility], [address], [place], [place] [address]    Culture (A)  Final    <10,000 COLONIES/mL INSIGNIFICANT GROWTH Performed at [facility] [address] [place], [place] [address]    Report Status 06/30/2021 FINAL  Final  Resp Panel by RT-PCR (Flu A&B, Covid) Nasopharyngeal Swab     Status: None   Collection Time: 06/29/21  1:00 PM   Specimen: Nasopharyngeal Swab; Nasopharyngeal(NP) swabs in vial transport medium   Result Value Ref Range Status   SARS Coronavirus 2 by RT PCR NEGATIVE NEGATIVE Final    Comment: (NOTE) SARS-CoV-2 target nucleic acids are NOT DETECTED.  The SARS-CoV-2 RNA is generally detectable in upper respiratory specimens during the acute phase of infection. The lowest concentration of SARS-CoV-2 viral copies this assay can detect is 138 copies/mL. A negative result does not preclude SARS-Cov-2 infection and should not be used as the sole basis for treatment or other patient management decisions. A negative result may occur with  improper specimen collection/handling, submission of specimen other than nasopharyngeal swab, presence of viral mutation(s) within the areas targeted by this assay, and inadequate number of viral copies(<138 copies/mL). A negative result must be combined with clinical observations, patient history, and epidemiological information. The expected result is Negative.  Fact Sheet for Patients:  EntrepreneurPulse.com.au  Fact Sheet for Healthcare Providers:  IncredibleEmployment.be  This test is no t yet approved or cleared by the [place] FDA and  has been authorized for detection and/or diagnosis of SARS-CoV-2 by FDA under an Emergency Use Authorization (EUA). This EUA will remain  in effect (meaning this test can be used) for the duration of the COVID-19 declaration under Section 564(b)(1) of the Act, 21 U.S.C.section 360bbb-3(b)(1), unless the authorization is terminated  or revoked sooner.       Influenza A by PCR NEGATIVE NEGATIVE Final   Influenza B by PCR NEGATIVE NEGATIVE Final    Comment: (NOTE) The Xpert Xpress SARS-CoV-2/FLU/RSV plus assay is intended as an aid in the diagnosis of influenza from Nasopharyngeal swab specimens and should not be used as a sole basis for treatment. Nasal washings and aspirates are unacceptable for Xpert Xpress SARS-CoV-2/FLU/RSV testing.  Fact Sheet for  Patients: EntrepreneurPulse.com.au  Fact Sheet for Healthcare Providers: IncredibleEmployment.be  This test is not yet approved or cleared by the [place] FDA and has been authorized for detection and/or diagnosis of SARS-CoV-2 by FDA under an Emergency Use Authorization (EUA). This EUA will remain in effect (meaning this test can be used) for the duration of the COVID-19 declaration under Section 564(b)(1) of the Act, 21 U.S.C. section 360bbb-3(b)(1), unless the authorization is terminated or revoked.  Performed at [facility], [address] [place], [place] [address]      Labs: BNP (last 3 results) No results for input(s): BNP in the last 8760 hours. Basic Metabolic Panel: Recent Labs  Lab 06/29/21 1115 06/30/21 0109 07/01/21 0708  NA 138 138 140  K 4.5 3.7 4.2  CL 103 104 104  CO2 '26 26 25  '$ GLUCOSE 108* 120* 107*  BUN 8 <5* 9  CREATININE 0.66 0.66 0.70  CALCIUM 9.1 9.2 8.9  MG  --  1.8 2.1  PHOS  --  4.1  --    Liver Function Tests: Recent Labs  Lab 06/29/21 1123 06/30/21 0109  AST 19 16  ALT 13 15  ALKPHOS 72 70  BILITOT 1.5* 1.8*  PROT 6.8 6.7  ALBUMIN 3.9 3.2*   Recent Labs  Lab 06/29/21 1123  LIPASE 11   No results for input(s): AMMONIA in the last 168 hours. CBC: Recent Labs  Lab 06/29/21 1115 06/29/21 1645 06/30/21 0926 07/01/21 0708  WBC 6.9 6.4 6.2 5.3  NEUTROABS 5.0  --   --   --   HGB 12.3 11.7* 12.2 12.0  HCT 38.8 36.6 38.2 37.0  MCV 90.0 89.9 90.7 91.4  PLT 238 210 217 235   Cardiac Enzymes: No results for input(s): CKTOTAL, CKMB, CKMBINDEX, TROPONINI in the last 168 hours. BNP: Invalid input(s): POCBNP CBG: No results for input(s): GLUCAP in the last 168 hours. D-Dimer No results for input(s): DDIMER in the last 72 hours. Hgb A1c No results for input(s): HGBA1C in the last 72 hours. Lipid Profile No results for input(s): CHOL, HDL, LDLCALC, TRIG, CHOLHDL,  LDLDIRECT in the last 72 hours. Thyroid function studies Recent Labs    06/30/21 0109  TSH 4.353   Anemia work up No results for input(s): VITAMINB12, FOLATE, FERRITIN, TIBC, IRON, RETICCTPCT in the last 72 hours. Urinalysis    Component Value Date/Time   COLORURINE YELLOW 06/29/2021 1250   APPEARANCEUR CLEAR 06/29/2021 1250   APPEARANCEUR Cloudy (A) 04/30/2021 1138   LABSPEC 1.007 06/29/2021 1250   PHURINE 7.0 06/29/2021 1250   GLUCOSEU NEGATIVE 06/29/2021 1250   HGBUR NEGATIVE 06/29/2021 1250   BILIRUBINUR NEGATIVE 06/29/2021 1250   BILIRUBINUR Negative 04/30/2021 [place] 06/29/2021 1250   PROTEINUR NEGATIVE 06/29/2021 1250   NITRITE NEGATIVE 06/29/2021 1250   LEUKOCYTESUR TRACE (A) 06/29/2021 1250   Sepsis Labs Invalid input(s): PROCALCITONIN,  WBC,  LACTICIDVEN Microbiology Recent Results (from the past 240 hour(s))  Urine Culture     Status: Abnormal   Collection Time: 06/29/21 12:50 PM   Specimen: Urine, Clean Catch  Result Value Ref Range Status   Specimen Description   Final    URINE, CLEAN CATCH Performed at [facility], [address], [place], [place] [address]    Special Requests   Final    NONE Performed at [facility], [address], [place], [place] [address]    Culture (A)  Final    <10,000 COLONIES/mL INSIGNIFICANT GROWTH Performed at [facility], [place] [address], [place], [place] [address]    Report Status 06/30/2021 FINAL  Final  Resp Panel by RT-PCR (Flu A&B, Covid) Nasopharyngeal Swab     Status: None   Collection Time: 06/29/21  1:00 PM   Specimen: Nasopharyngeal Swab; Nasopharyngeal(NP) swabs in vial transport medium  Result Value Ref Range Status   SARS Coronavirus 2 by RT PCR NEGATIVE NEGATIVE Final    Comment: (NOTE) SARS-CoV-2 target nucleic acids are NOT DETECTED.  The SARS-CoV-2 RNA is generally detectable in upper respiratory specimens during the acute phase of  infection. The lowest concentration of SARS-CoV-2 viral copies this assay can detect is 138 copies/mL. A negative result does not preclude SARS-Cov-2 infection and should not be used as the sole basis for treatment or other patient management decisions.  A negative result may occur with  improper specimen collection/handling, submission of specimen other than nasopharyngeal swab, presence of viral mutation(s) within the areas targeted by this assay, and inadequate number of viral copies(<138 copies/mL). A negative result must be combined with clinical observations, patient history, and epidemiological information. The expected result is Negative.  Fact Sheet for Patients:  EntrepreneurPulse.com.au  Fact Sheet for Healthcare Providers:  IncredibleEmployment.be  This test is no t yet approved or cleared by the [place] FDA and  has been authorized for detection and/or diagnosis of SARS-CoV-2 by FDA under an Emergency Use Authorization (EUA). This EUA will remain  in effect (meaning this test can be used) for the duration of the COVID-19 declaration under Section 564(b)(1) of the Act, 21 U.S.C.section 360bbb-3(b)(1), unless the authorization is terminated  or revoked sooner.       Influenza A by PCR NEGATIVE NEGATIVE Final   Influenza B by PCR NEGATIVE NEGATIVE Final    Comment: (NOTE) The Xpert Xpress SARS-CoV-2/FLU/RSV plus assay is intended as an aid in the diagnosis of influenza from Nasopharyngeal swab specimens and should not be used as a sole basis for treatment. Nasal washings and aspirates are unacceptable for Xpert Xpress SARS-CoV-2/FLU/RSV testing.  Fact Sheet for Patients: EntrepreneurPulse.com.au  Fact Sheet for Healthcare Providers: IncredibleEmployment.be  This test is not yet approved or cleared by the [place] FDA and has been authorized for detection and/or diagnosis of SARS-CoV-2  by FDA under an Emergency Use Authorization (EUA). This EUA will remain in effect (meaning this test can be used) for the duration of the COVID-19 declaration under Section 564(b)(1) of the Act, 21 U.S.C. section 360bbb-3(b)(1), unless the authorization is terminated or revoked.  Performed at [facility], [address] [place], [place] [address]      Time coordinating discharge: 38 minutes.   SIGNED:   Hosie Poisson, MD  [facility] 07/01/2021, 11:16 AM

## 2021-07-01 NOTE — Progress Notes (Signed)
Reviewed discharge teaching and pts ride will be here by 2pm.

## 2021-07-04 ENCOUNTER — Encounter: Payer: Self-pay | Admitting: Nurse Practitioner

## 2021-07-04 ENCOUNTER — Other Ambulatory Visit: Payer: Self-pay

## 2021-07-04 ENCOUNTER — Ambulatory Visit (INDEPENDENT_AMBULATORY_CARE_PROVIDER_SITE_OTHER): Payer: Medicare Other | Admitting: Nurse Practitioner

## 2021-07-04 VITALS — BP 135/73 | HR 77 | Temp 97.3°F | Ht 66.0 in | Wt 279.0 lb

## 2021-07-04 DIAGNOSIS — S3600XA Unspecified injury of spleen, initial encounter: Secondary | ICD-10-CM

## 2021-07-04 DIAGNOSIS — S3600XD Unspecified injury of spleen, subsequent encounter: Secondary | ICD-10-CM | POA: Diagnosis not present

## 2021-07-04 DIAGNOSIS — Z7689 Persons encountering health services in other specified circumstances: Secondary | ICD-10-CM

## 2021-07-04 DIAGNOSIS — Z5181 Encounter for therapeutic drug level monitoring: Secondary | ICD-10-CM | POA: Insufficient documentation

## 2021-07-04 NOTE — Assessment & Plan Note (Signed)
Symptoms are well controlled.  Patient is reporting improved pain, no nausea, vomiting or fever.  Completed repeat BMP and CBC results pending.

## 2021-07-04 NOTE — Patient Instructions (Signed)
Splenic Injury  A splenic injury is an injury of the spleen. The spleen is an organ located in the upper left area of the abdomen, just under the ribs. The spleen filters and cleans the blood. It also stores blood cells and destroys cells that are wornout. The spleen also plays an important role in fighting disease. Splenic injuries can vary. In some cases, the spleen may only be bruised, with some bleeding inside the covering and around the spleen. Splenic injuries may also cause a deep tear or cut into the spleen (lacerated spleen). Some splenic injuries can cause the spleen to break open (rupture). What are the causes? This condition may be caused by a direct blow (trauma) to the spleen. Trauma can result from: Car accidents. Contact sports. Falls. Penetrating injuries. These can be caused by gunshot wounds or sharp objects such as a knife. What increases the risk? You may be at greater risk for a splenic injury if you have a disease that can cause the spleen to become enlarged. These include: Alcoholic liver disease. Viral infections, especially mononucleosis. Certain inflammatory diseases, such as lupus. Certain cancers, especially those that involve the lymphatic system. Cystic fibrosis. What are the signs or symptoms? Symptoms of this condition depend on the severity of the injury. A minor injury often causes no symptoms or only minor pain in the abdomen. A major injury can result in severe bleeding, causing your blood pressure to decrease rapidly. This in turn will cause symptoms such as: Dizziness or light-headedness. Rapid heart rate. Difficulty breathing. Fainting. Sweating with clammy skin. Other symptoms of a splenic injury may include: Very bad abdominal pain. Pain in the left shoulder. Pain when the abdomen is pressed (tenderness). Nausea. Swelling or bruising of the abdomen. How is this diagnosed? This condition may be diagnosed based on: Your symptoms and medical  history, especially if you were recently in an accident or you recently got hurt. A physical exam. Imaging tests, such as: CT scan. Ultrasound. You may have frequent blood tests for a few days after the injury to monitoryour condition. How is this treated? Treatment depends on the type of splenic injury you have and how bad it is. Less severe injuries may be treated with: Observation. Interventional radiology. This involves using flexible tubes (catheters) to stop the bleeding from inside the blood vessel. More severe injuries may require hospitalization in the intensive care unit (ICU). While you are in the hospital: Your fluid and blood levels will be monitored closely. You will get fluids through an IV as needed. You may need follow-up scans to check whether your spleen is able to heal itself. If the injury is getting worse, you may need surgery. You may receive donated blood (transfusion). You may have a long needle inserted into your abdomen to remove any blood that has collected inside the spleen (hematoma). If your blood pressure is too low, you may need emergency surgery. This may include: Repairing a laceration. Removing part of the spleen. Removing the entire spleen (splenectomy). Follow these instructions at home: Activity Rest as told by your health care provider. Avoid sitting for a long time without moving. Get up to take short walks every 1-2 hours. This is important to improve blood flow and breathing. Ask for help if you feel weak or unsteady. Do not participate in any activity that takes a lot of effort until your health care provider says that it is safe. Do not take part in contact sports until your health care provider says  it is safe to do so. Do not lift anything that is heavier than 10 lb (4.5 kg), or the limit that you are told, until your health care provider says that it is safe. General instructions Take over-the-counter and prescription medicines only as told  by your health care provider. Stay up-to-date on vaccines as told by your health care provider. Follow instructions from your health care provider about eating or drinking restrictions. Do not drink alcohol. Do not use any products that contain nicotine or tobacco, such as cigarettes and e-cigarettes. These may delay healing after an injury. If you need help quitting, ask your health care provider. Keep all follow-up visits as told by your health care provider. This is important. Get help right away if you have: A fever. New or increasing pain in your abdomen or in your left shoulder. Signs or symptoms of internal bleeding. Watch for: Sweating. Dizziness. Weakness. Cold and clammy skin. Fainting. Chest pain or difficulty breathing. Summary The spleen is an organ located in the upper left area of the abdomen, just under the ribs. A splenic injury is an injury of the spleen. This may be caused by a direct blow (trauma) to the spleen. You may be at greater risk for a splenic injury if you have a disease that can cause the spleen to become enlarged. A minor injury often causes no symptoms or only minor pain in the abdomen. A major injury can result in severe bleeding, causing your blood pressure to decrease rapidly. Treatment depends on the type of splenic injury you have and how bad it is. This information is not intended to replace advice given to you by your health care provider. Make sure you discuss any questions you have with your healthcare provider. Document Revised: 10/11/2020 Document Reviewed: 10/11/2020 Elsevier Patient Education  2022 [employer].

## 2021-07-04 NOTE — Assessment & Plan Note (Signed)
Completed TOC education with medication reconciliation.  Printed handouts given.  Patient verbalized understanding.  Advised to follow-up with any new symptoms.

## 2021-07-04 NOTE — Progress Notes (Signed)
Established Patient Office Visit  Subjective:  Patient ID: [name] Collier, female    DOB: Nov 16, 1946  Age: 75 y.o. MRN: [id number]  CC:  Chief Complaint  Patient presents with   Transitions Of Care    HPI [name] Collier presents is a 75 year old female who presents to clinic today for follow-up and transition of care management for spleen injury after colonoscopy.  Patient is reporting only mild pain left upper quadrant, no nausea or vomiting, no shortness of breath, patient is passing flatus and having normal bowel movement.  Completed CBC, and BMP results pending. Today's visit was for Transitional Care Management.    The patient was discharged from [facility] on 07/01/2021 with a primary diagnosis of left-sided chest and abdominal pain post colonoscopy.   Contact with the patient and/or caregiver, by a clinical staff member, was made on 07/01/2021 and was documented as a telephone encounter within the EMR.  Through chart review and discussion with the patient I have determined that management of their condition is of moderate complexity.   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   GERD (gastroesophageal reflux disease)    HTN (hypertension)    Hyperlipidemia    TIA (transient ischemic attack)     Past Surgical History:  Procedure Laterality Date   BOTOX INJECTION N/A 12/22/2018   Procedure: BOTOX INJECTION INTO ANAL SPHINCTER;  Surgeon: Ileana Roup, MD;  Location: [facility];  Service: General;  Laterality: N/A;   BREAST BIOPSY Left 11/26/2020   BREAST LUMPECTOMY WITH RADIOACTIVE SEED AND SENTINEL LYMPH NODE BIOPSY Left 01/07/2021   Procedure: LEFT BREAST LUMPECTOMY WITH RADIOACTIVE SEED AND LEFT AXILLARY SENTINEL LYMPH NODE BIOPSY WITH BLUE DYE INJECTION;  Surgeon: Donnie Mesa,  MD;  Location: [facility];  Service: General;  Laterality: Left;  LMA WITH PECTORAL BLOCK, BLUE DYE INJECTION   CARDIOVERSION N/A 01/04/2018   Procedure: CARDIOVERSION;  Surgeon: Dorothy Spark, MD;  Location: [facility]COPY;  Service: Cardiovascular;  Laterality: N/A;   CARDIOVERSION N/A 09/20/2018   Procedure: CARDIOVERSION;  Surgeon: Sueanne Margarita, MD;  Location: [facility]OSCOPY;  Service: Cardiovascular;  Laterality: N/A;   CARDIOVERSION N/A 10/27/2018   Procedure: CARDIOVERSION;  Surgeon: Buford Dresser, MD;  Location: [facility] ENDOSCOPY;  Service: Cardiovascular;  Laterality: N/A;   KNEE SURGERY Bilateral    Torn miniscus   RECTAL EXAM UNDER ANESTHESIA N/A 12/22/2018   Procedure: ANORECTAL  EXAM UNDER ANESTHESIA;  Surgeon: Ileana Roup, MD;  Location: [facility];  Service: General;  Laterality: N/A;   RECTOCELE REPAIR     TONSILLECTOMY     VAGINAL HYSTERECTOMY      Family History  Problem Relation Age of Onset   Hypertension Mother    Hyperlipidemia Mother    Neurologic Disorder Mother 68       GB   Stroke Son    Stroke Maternal Uncle    Stroke Grandchild    Prostate cancer Brother     Social History   Socioeconomic History   Marital status: Widowed    Spouse name: Not on file   Number of children: 2   Years of education: 12   Highest education level: Not on file  Occupational History   Occupation: Retired    Comment: Personnel officer  Tobacco Use  Smoking status: Never   Smokeless tobacco: Never  Vaping Use   Vaping Use: Never used  Substance and Sexual Activity   Alcohol use: No    Alcohol/week: 0.0 standard drinks   Drug use: No   Sexual activity: Not Currently    Birth control/protection: Post-menopausal  Other Topics Concern   Not on file  Social History Narrative   Lives alone - children in [place] -    Caffeine use: Soda/tea daily   Good church support group   Social Determinants of Health   Financial Resource  Strain: Low Risk    Difficulty of Paying Living Expenses: Not hard at all  Food Insecurity: No Food Insecurity   Worried About Charity fundraiser in the Last Year: Never true   Arboriculturist in the Last Year: Never true  Transportation Needs: No Transportation Needs   Lack of Transportation (Medical): No   Lack of Transportation (Non-Medical): No  Physical Activity: Insufficiently Active   Days of Exercise per Week: 7 days   Minutes of Exercise per Session: 10 min  Stress: Stress Concern Present   Feeling of Stress : To some extent  Social Connections: Moderately Integrated   Frequency of Communication with Friends and Family: More than three times a week   Frequency of Social Gatherings with Friends and Family: More than three times a week   Attends Religious Services: More than 4 times per year   Active Member of [employer] or Organizations: Yes   Attends Archivist Meetings: More than 4 times per year   Marital Status: Widowed  Human resources officer Violence: Not At Risk   Fear of Current or Ex-Partner: No   Emotionally Abused: No   Physically Abused: No   Sexually Abused: No    Outpatient Medications Prior to Visit  Medication Sig Dispense Refill   acetaminophen (TYLENOL) 500 MG tablet Take 1,000 mg by mouth every 6 (six) hours as needed for moderate pain or headache.     amLODipine (NORVASC) 5 MG tablet TAKE 1 AND 1/2 TABLETS BY  MOUTH DAILY 135 tablet 0   anastrozole (ARIMIDEX) 1 MG tablet Take 1 tablet (1 mg total) by mouth daily. 90 tablet 3   Calcium Carbonate-Vit D-Min (CALCIUM 1200 PO) Take 1,200 mg by mouth every morning.     Cholecalciferol (VITAMIN D3) 5000 units CAPS Take 5,000 Units by mouth daily with supper.     dofetilide (TIKOSYN) 500 MCG capsule TAKE 1 CAPSULE BY MOUTH 2 TIMES DAILY. 180 capsule 2   DULoxetine (CYMBALTA) 30 MG capsule TAKE 1 CAPSULE BY MOUTH  DAILY 90 capsule 3   ezetimibe (ZETIA) 10 MG tablet TAKE 1 TABLET BY MOUTH  DAILY 90 tablet 0    famotidine (PEPCID) 20 MG tablet TAKE 1 TABLET BY MOUTH  TWICE DAILY 180 tablet 3   furosemide (LASIX) 20 MG tablet TAKE 1 TABLET BY MOUTH  DAILY 90 tablet 0   olmesartan (BENICAR) 5 MG tablet TAKE 1 TABLET BY MOUTH  DAILY 90 tablet 0   potassium chloride SA (KLOR-CON) 20 MEQ tablet TAKE 1 TABLET BY MOUTH  TWICE DAILY 180 tablet 0   rivaroxaban (XARELTO) 20 MG TABS tablet Take 1 tablet (20 mg total) by mouth daily with supper. 90 tablet 1   No facility-administered medications prior to visit.    Allergies  Allergen Reactions   Augmentin [Amoxicillin-Pot Clavulanate] Other (See Comments)    c-diff Has patient had a PCN reaction causing immediate  rash, facial/tongue/throat swelling, SOB or lightheadedness with hypotension: No Has patient had a PCN reaction causing severe rash involving mucus membranes or skin necrosis: No Has patient had a PCN reaction that required hospitalization: No Has patient had a PCN reaction occurring within the last 10 years: Yes If all of the above answers are "NO", then may proceed with Cephalosporin use.    Codeine Palpitations and Rash   Prednisone Other (See Comments)    Jittery, red in the face    ROS Review of Systems  Constitutional: Negative.   HENT: Negative.    Respiratory: Negative.    Cardiovascular: Negative.   Gastrointestinal:  Positive for abdominal pain. Negative for blood in stool, constipation, diarrhea and nausea.  Genitourinary: Negative.   Musculoskeletal: Negative.   Skin:  Negative for rash.  All other systems reviewed and are negative.    Objective:    Physical Exam Vitals and nursing note reviewed.  Constitutional:      Appearance: Normal appearance.  HENT:     Head: Normocephalic.     Nose: Nose normal.  Cardiovascular:     Rate and Rhythm: Normal rate and regular rhythm.     Pulses: Normal pulses.     Heart sounds: Normal heart sounds.  Pulmonary:     Effort: Pulmonary effort is normal.     Breath sounds: Normal  breath sounds.  Abdominal:     General: Bowel sounds are normal.     Tenderness: There is abdominal tenderness in the left upper quadrant.       Comments: Left upper quadrant pain  Skin:    Findings: No rash.  Neurological:     Mental Status: She is alert and oriented to person, place, and time.    BP 135/73   Pulse 77   Temp (!) 97.3 F (36.3 C) (Temporal)   Ht _0  (1.676 m)   Wt 279 lb (126.6 kg)   SpO2 99%   BMI 45.03 kg/m  Wt Readings from Last 3 Encounters:  07/04/21 279 lb (126.6 kg)  06/29/21 279 lb (126.6 kg)  06/19/21 282 lb 3.2 oz (128 kg)     Health Maintenance Due  Topic Date Due   COVID-19 Vaccine (4 - Booster for Moderna series) 03/20/2021    There are no preventive care reminders to display for this patient.  Lab Results  Component Value Date   TSH 4.353 06/30/2021   Lab Results  Component Value Date   WBC 5.3 07/01/2021   HGB 12.0 07/01/2021   HCT 37.0 07/01/2021   MCV 91.4 07/01/2021   PLT 235 07/01/2021   Lab Results  Component Value Date   NA 140 07/01/2021   K 4.2 07/01/2021   CO2 25 07/01/2021   GLUCOSE 107 (H) 07/01/2021   BUN 9 07/01/2021   CREATININE 0.70 07/01/2021   BILITOT 1.8 (H) 06/30/2021   ALKPHOS 70 06/30/2021   AST 16 06/30/2021   ALT 15 06/30/2021   PROT 6.7 06/30/2021   ALBUMIN 3.2 (L) 06/30/2021   CALCIUM 8.9 07/01/2021   ANIONGAP 11 07/01/2021   EGFR 74 05/30/2021   Lab Results  Component Value Date   CHOL 180 11/27/2020   Lab Results  Component Value Date   HDL 62 11/27/2020   Lab Results  Component Value Date   LDLCALC 101 (H) 11/27/2020   Lab Results  Component Value Date   TRIG 91 11/27/2020   Lab Results  Component Value Date   CHOLHDL 2.9 11/27/2020  Lab Results  Component Value Date   HGBA1C 5.8 11/27/2020      Assessment & Plan:   Problem List Items Addressed This Visit       Other   Spleen injury    Symptoms are well controlled.  Patient is reporting improved pain, no  nausea, vomiting or fever.  Completed repeat BMP and CBC results pending.       Relevant Orders   CBC with Differential   Basic Metabolic Panel   Encounter for support and coordination of transition of care - Primary    Completed TOC education with medication reconciliation.  Printed handouts given.  Patient verbalized understanding.  Advised to follow-up with any new symptoms.       Relevant Orders   CBC with Differential   Basic Metabolic Panel       Follow-up: Return if symptoms worsen or fail to improve.    Ivy Lynn, NP

## 2021-07-05 LAB — BASIC METABOLIC PANEL
BUN/Creatinine Ratio: 15 (ref 12–28)
BUN: 11 mg/dL (ref 8–27)
CO2: 22 mmol/L (ref 20–29)
Calcium: 9.4 mg/dL (ref 8.7–10.3)
Chloride: 105 mmol/L (ref 96–106)
Creatinine, Ser: 0.73 mg/dL (ref 0.57–1.00)
Glucose: 103 mg/dL — ABNORMAL HIGH (ref 65–99)
Potassium: 4.9 mmol/L (ref 3.5–5.2)
Sodium: 143 mmol/L (ref 134–144)
eGFR: 86 mL/min/{1.73_m2} (ref >59)

## 2021-07-05 LAB — CBC WITH DIFFERENTIAL/PLATELET
Basophils Absolute: 0.1 10*3/uL (ref 0.0–0.2)
Basos: 1 %
EOS (ABSOLUTE): 0.1 10*3/uL (ref 0.0–0.4)
Eos: 3 %
Hematocrit: 37.5 % (ref 34.0–46.6)
Hemoglobin: 12.4 g/dL (ref 11.1–15.9)
Immature Grans (Abs): 0 10*3/uL (ref 0.0–0.1)
Immature Granulocytes: 0 %
Lymphocytes Absolute: 1.1 10*3/uL (ref 0.7–3.1)
Lymphs: 22 %
MCH: 29.1 pg (ref 26.6–33.0)
MCHC: 33.1 g/dL (ref 31.5–35.7)
MCV: 88 fL (ref 79–97)
Monocytes Absolute: 0.6 10*3/uL (ref 0.1–0.9)
Monocytes: 12 %
Neutrophils Absolute: 3.2 10*3/uL (ref 1.4–7.0)
Neutrophils: 62 %
Platelets: 293 10*3/uL (ref 150–450)
RBC: 4.26 x10E6/uL (ref 3.77–5.28)
RDW: 12.7 % (ref 11.7–15.4)
WBC: 5.1 10*3/uL (ref 3.4–10.8)

## 2021-07-07 ENCOUNTER — Other Ambulatory Visit (HOSPITAL_COMMUNITY): Payer: Self-pay

## 2021-07-07 MED ORDER — APIXABAN 5 MG PO TABS: 5.0000 mg | ORAL_TABLET | Freq: Two times a day (BID) | ORAL | 0 refills | Status: DC

## 2021-07-08 ENCOUNTER — Encounter (INDEPENDENT_AMBULATORY_CARE_PROVIDER_SITE_OTHER): Payer: Self-pay

## 2021-07-22 ENCOUNTER — Encounter (HOSPITAL_COMMUNITY): Payer: Self-pay | Admitting: Nurse Practitioner

## 2021-07-22 ENCOUNTER — Other Ambulatory Visit (HOSPITAL_COMMUNITY): Payer: Self-pay

## 2021-07-22 ENCOUNTER — Other Ambulatory Visit: Payer: Self-pay

## 2021-07-22 ENCOUNTER — Ambulatory Visit (HOSPITAL_COMMUNITY)
Admit: 2021-07-22 | Discharge: 2021-07-22 | Disposition: A | Discharge status: Home / Self Care | Payer: Medicare Other | Source: Ambulatory Visit | Attending: Nurse Practitioner | Admitting: Nurse Practitioner

## 2021-07-22 VITALS — BP 142/82 | HR 69 | Ht 66.0 in | Wt 282.0 lb

## 2021-07-22 DIAGNOSIS — Z7901 Long term (current) use of anticoagulants: Secondary | ICD-10-CM | POA: Insufficient documentation

## 2021-07-22 DIAGNOSIS — I10 Essential (primary) hypertension: Secondary | ICD-10-CM | POA: Insufficient documentation

## 2021-07-22 DIAGNOSIS — Z885 Allergy status to narcotic agent status: Secondary | ICD-10-CM | POA: Diagnosis not present

## 2021-07-22 DIAGNOSIS — D6869 Other thrombophilia: Secondary | ICD-10-CM | POA: Diagnosis not present

## 2021-07-22 DIAGNOSIS — Z79899 Other long term (current) drug therapy: Secondary | ICD-10-CM | POA: Diagnosis not present

## 2021-07-22 DIAGNOSIS — I4819 Other persistent atrial fibrillation: Secondary | ICD-10-CM | POA: Insufficient documentation

## 2021-07-22 DIAGNOSIS — I480 Paroxysmal atrial fibrillation: Secondary | ICD-10-CM

## 2021-07-22 DIAGNOSIS — Z8249 Family history of ischemic heart disease and other diseases of the circulatory system: Secondary | ICD-10-CM | POA: Insufficient documentation

## 2021-07-22 MED ORDER — APIXABAN 5 MG PO TABS: 5.0000 mg | ORAL_TABLET | Freq: Two times a day (BID) | ORAL | 0 refills | Status: DC

## 2021-07-22 MED ORDER — APIXABAN 5 MG PO TABS: 5.0000 mg | ORAL_TABLET | Freq: Two times a day (BID) | ORAL | 1 refills | Status: DC

## 2021-07-22 NOTE — Progress Notes (Addendum)
Primary Care Physician: [name], DO Referring Physician: Dr. Radford Pax Cardiologist: Dr. Percival Spanish EP: Dr. [name] Hearing is a 75 y.o. female with a h/o afib, HTN, hyperlipidemia and TIA. She is in the afib clinic for f/u of Tikosyn use. She feels well with NSR today. She did have a mechanical fall a couple of weeks ago and was placed on metoprolol 25 mg bid for afib secondary to the fall. . She did convert in a couple of days and then stopped BB with issues with hypotension and bradycardia. Now however, her BP has been running higher, around [id number] systolic.  Continues on xarelto 20 mg daily with a CHA2DS2VASc of at least 5, without any signs of bleeding.   F/u with afib clinic 06/19/21. She is staying in [place] with Tikosyn. She has had an lumpectomy and radiation. Is finished with treatment now and is on daily. Arimidex. She is pending a colonoscopy and has received instruction re holding her [place]. Reminded to take Tikosyn with a sip of water.    F/u in afib clinic, 07/22/21,  after a hemo peritoneal bleed around her spleen following a colonoscopy. She had minimal cbc drop and was observed x 2 days in the hospital. It did resolve without any intervention. She was switched form xarelto to eliquis on d/c.   Today, she denies symptoms of palpitations, chest pain, shortness of breath, orthopnea, PND, lower extremity edema, dizziness, presyncope, syncope, or neurologic sequela. The patient is tolerating medications without difficulties and is otherwise without complaint today.   Past Medical History:  Diagnosis Date   Anxiety    Atrial fibrillation ([place])    Cancer ([place]) 11/2020   left breast IMC   GERD (gastroesophageal reflux disease)    HTN (hypertension)    Hyperlipidemia    TIA (transient ischemic attack)    Past Surgical History:  Procedure Laterality Date   BOTOX INJECTION N/A 12/22/2018   Procedure: BOTOX INJECTION INTO ANAL SPHINCTER;  Surgeon: Ileana Roup,  MD;  Location: [facility];  Service: General;  Laterality: N/A;   BREAST BIOPSY Left 11/26/2020   BREAST LUMPECTOMY WITH RADIOACTIVE SEED AND SENTINEL LYMPH NODE BIOPSY Left 01/07/2021   Procedure: LEFT BREAST LUMPECTOMY WITH RADIOACTIVE SEED AND LEFT AXILLARY SENTINEL LYMPH NODE BIOPSY WITH BLUE DYE INJECTION;  Surgeon: Donnie Mesa, MD;  Location: [facility];  Service: General;  Laterality: Left;  LMA WITH PECTORAL BLOCK, BLUE DYE INJECTION   CARDIOVERSION N/A 01/04/2018   Procedure: CARDIOVERSION;  Surgeon: Dorothy Spark, MD;  Location: [facility];  Service: Cardiovascular;  Laterality: N/A;   CARDIOVERSION N/A 09/20/2018   Procedure: CARDIOVERSION;  Surgeon: Sueanne Margarita, MD;  Location: [facility];  Service: Cardiovascular;  Laterality: N/A;   CARDIOVERSION N/A 10/27/2018   Procedure: CARDIOVERSION;  Surgeon: Buford Dresser, MD;  Location: [facility];  Service: Cardiovascular;  Laterality: N/A;   KNEE SURGERY Bilateral    Torn miniscus   RECTAL EXAM UNDER ANESTHESIA N/A 12/22/2018   Procedure: ANORECTAL  EXAM UNDER ANESTHESIA;  Surgeon: Ileana Roup, MD;  Location: [facility];  Service: General;  Laterality: N/A;   RECTOCELE REPAIR     TONSILLECTOMY     VAGINAL HYSTERECTOMY      Current Outpatient Medications  Medication Sig Dispense Refill   acetaminophen (TYLENOL) 500 MG tablet Take 1,000 mg by mouth every 6 (six) hours as needed for moderate pain or headache.     amLODipine (NORVASC) 5 MG tablet TAKE 1  AND 1/2 TABLETS BY  MOUTH DAILY 135 tablet 0   anastrozole (ARIMIDEX) 1 MG tablet Take 1 tablet (1 mg total) by mouth daily. 90 tablet 3   Calcium Carbonate-Vit D-Min (CALCIUM 1200 PO) Take 1,200 mg by mouth every morning.     Cholecalciferol (VITAMIN D3) 5000 units CAPS Take 5,000 Units by mouth daily with supper.     dofetilide (TIKOSYN) 500 MCG capsule TAKE 1 CAPSULE BY MOUTH 2 TIMES DAILY. 180 capsule 2   DULoxetine (CYMBALTA) 30 MG capsule TAKE  1 CAPSULE BY MOUTH  DAILY 90 capsule 3   ezetimibe (ZETIA) 10 MG tablet TAKE 1 TABLET BY MOUTH  DAILY 90 tablet 0   famotidine (PEPCID) 20 MG tablet TAKE 1 TABLET BY MOUTH  TWICE DAILY 180 tablet 3   furosemide (LASIX) 20 MG tablet TAKE 1 TABLET BY MOUTH  DAILY 90 tablet 0   olmesartan (BENICAR) 5 MG tablet TAKE 1 TABLET BY MOUTH  DAILY 90 tablet 0   potassium chloride SA (KLOR-CON) 20 MEQ tablet TAKE 1 TABLET BY MOUTH  TWICE DAILY 180 tablet 0   apixaban (ELIQUIS) 5 MG TABS tablet Take 1 tablet (5 mg total) by mouth 2 (two) times daily. 28 tablet 0   No current facility-administered medications for this encounter.    Allergies  Allergen Reactions   Augmentin [Amoxicillin-Pot Clavulanate] Other (See Comments)    c-diff Has patient had a PCN reaction causing immediate rash, facial/tongue/throat swelling, SOB or lightheadedness with hypotension: No Has patient had a PCN reaction causing severe rash involving mucus membranes or skin necrosis: No Has patient had a PCN reaction that required hospitalization: No Has patient had a PCN reaction occurring within the last 10 years: Yes If all of the above answers are "NO", then may proceed with Cephalosporin use.    Codeine Palpitations and Rash   Prednisone Other (See Comments)    Jittery, red in the face    Social History   Socioeconomic History   Marital status: Widowed    Spouse name: Not on file   Number of children: 2   Years of education: 66   Highest education level: Not on file  Occupational History   Occupation: Retired    Comment: Personnel officer  Tobacco Use   Smoking status: Never   Smokeless tobacco: Never  Scientific laboratory technician Use: Never used  Substance and Sexual Activity   Alcohol use: No    Alcohol/week: 0.0 standard drinks   Drug use: No   Sexual activity: Not Currently    Birth control/protection: Post-menopausal  Other Topics Concern   Not on file  Social History Narrative   Lives alone - children in  [place] -    Caffeine use: Soda/tea daily   Good church support group   Social Determinants of Health   Financial Resource Strain: Low Risk    Difficulty of Paying Living Expenses: Not hard at all  Food Insecurity: No Food Insecurity   Worried About Charity fundraiser in the Last Year: Never true   Arboriculturist in the Last Year: Never true  Transportation Needs: No Transportation Needs   Lack of Transportation (Medical): No   Lack of Transportation (Non-Medical): No  Physical Activity: Insufficiently Active   Days of Exercise per Week: 7 days   Minutes of Exercise per Session: 10 min  Stress: Stress Concern Present   Feeling of Stress : To some extent  Social Connections: Moderately Integrated  Frequency of Communication with Friends and Family: More than three times a week   Frequency of Social Gatherings with Friends and Family: More than three times a week   Attends Religious Services: More than 4 times per year   Active Member of [employer] or Organizations: Yes   Attends Archivist Meetings: More than 4 times per year   Marital Status: Widowed  Human resources officer Violence: Not At Risk   Fear of Current or Ex-Partner: No   Emotionally Abused: No   Physically Abused: No   Sexually Abused: No    Family History  Problem Relation Age of Onset   Hypertension Mother    Hyperlipidemia Mother    Neurologic Disorder Mother 39       GB   Stroke Son    Stroke Maternal Uncle    Stroke Grandchild    Prostate cancer Brother     ROS- All systems are reviewed and negative except as per the HPI above  Physical Exam: Vitals:   07/22/21 1007  BP: (!) 142/82  Pulse: 69  Weight: 127.9 kg  Height: '5\' 6"'$  (1.676 m)   Wt Readings from Last 3 Encounters:  07/22/21 127.9 kg  07/04/21 126.6 kg  06/29/21 126.6 kg    Labs: Lab Results  Component Value Date   NA 143 07/04/2021   K 4.9 07/04/2021   CL 105 07/04/2021   CO2 22 07/04/2021   GLUCOSE 103 (H)  07/04/2021   BUN 11 07/04/2021   CREATININE 0.73 07/04/2021   CALCIUM 9.4 07/04/2021   PHOS 4.1 06/30/2021   MG 2.1 07/01/2021   Lab Results  Component Value Date   INR 1.7 (H) 06/29/2021   Lab Results  Component Value Date   CHOL 180 11/27/2020   HDL 62 11/27/2020   LDLCALC 101 (H) 11/27/2020   TRIG 91 11/27/2020     GEN- The patient is well appearing, alert and oriented x 3 today.   Head- normocephalic, atraumatic Eyes-  Sclera clear, conjunctiva pink Ears- hearing intact Oropharynx- clear Neck- supple, no JVP Lymph- no cervical lymphadenopathy Lungs- Clear to ausculation bilaterally, normal work of breathing Heart- regular rate and rhythm, no murmurs, rubs or gallops, PMI not laterally displaced GI- soft, NT, ND, + BS Extremities- no clubbing, cyanosis, trace LLE MS- no significant deformity or atrophy Skin- no rash or lesion Psych- euthymic mood, full affect Neuro- strength and sensation are intact  EKG- SR at 69 bpm, PR int 192 ms, qrs int 78 ms, qtc 435 ms    - Left ventricle: The cavity size was normal. There was mild   concentric hypertrophy. Systolic function was normal. The   estimated ejection fraction was in the range of 60% to 65%. Wall   motion was normal; there were no regional wall motion   abnormalities. - Aortic valve: Transvalvular velocity was within the normal range.   There was no stenosis. There was no regurgitation. - Aorta: Ascending aortic diameter: 38 mm (S). - Ascending aorta: The ascending aorta was mildly dilated. - Mitral valve: Mildly calcified annulus. Transvalvular velocity   was within the normal range. There was no evidence for stenosis.   There was mild regurgitation. - Left atrium: The atrium was mildly dilated.LAD 57 mm, volume 78 ml - Right ventricle: The cavity size was normal. Wall thickness was   normal. Systolic function was normal. - Atrial septum: No defect or patent foramen ovale was identified. - Tricuspid valve:  There was mild-moderate regurgitation. - Pulmonic  valve: There was moderate regurgitation. - Pulmonary arteries: Systolic pressure was severely increased. PA   peak pressure: 69 mm Hg (S).     Assessment and Plan: 1. Persistent afib Maintaining  SR on Tikosyn 500 mcg bid qtc stable Continue eliquis 5 mg bid for CHA2DS2VASc of at least 5  Bmet/mag recently checked and in range   2. HTN Stable Amlodipine  7.5 mg daily  3. Recent hemo peritoneal bleed thought to be 2/2 colonoscopy Resolved Recent CBC shows mild drop back to normal range   F/u with afib clinic  as scheduled early January 2023  [name] Collier, [facility] [address] [place], [place] [address] [phone]

## 2021-07-22 NOTE — Addendum Note (Signed)
Encounter addended by: Sherran Needs, NP on: 07/22/2021 12:32 PM  Actions taken: Clinical Note Signed

## 2021-07-23 ENCOUNTER — Other Ambulatory Visit: Payer: Self-pay | Admitting: Family Medicine

## 2021-08-04 ENCOUNTER — Other Ambulatory Visit (HOSPITAL_COMMUNITY): Payer: Self-pay | Admitting: Physician Assistant

## 2021-08-13 DIAGNOSIS — M170 Bilateral primary osteoarthritis of knee: Secondary | ICD-10-CM | POA: Diagnosis not present

## 2021-08-20 DIAGNOSIS — M170 Bilateral primary osteoarthritis of knee: Secondary | ICD-10-CM | POA: Diagnosis not present

## 2021-08-27 DIAGNOSIS — M7541 Impingement syndrome of right shoulder: Secondary | ICD-10-CM | POA: Diagnosis not present

## 2021-08-27 DIAGNOSIS — M542 Cervicalgia: Secondary | ICD-10-CM | POA: Diagnosis not present

## 2021-08-27 DIAGNOSIS — M170 Bilateral primary osteoarthritis of knee: Secondary | ICD-10-CM | POA: Diagnosis not present

## 2021-08-27 DIAGNOSIS — M7551 Bursitis of right shoulder: Secondary | ICD-10-CM | POA: Diagnosis not present

## 2021-08-27 DIAGNOSIS — M25511 Pain in right shoulder: Secondary | ICD-10-CM | POA: Diagnosis not present

## 2021-09-22 DIAGNOSIS — R310 Gross hematuria: Secondary | ICD-10-CM | POA: Diagnosis not present

## 2021-09-27 DIAGNOSIS — M1711 Unilateral primary osteoarthritis, right knee: Secondary | ICD-10-CM | POA: Diagnosis not present

## 2021-10-03 DIAGNOSIS — C50911 Malignant neoplasm of unspecified site of right female breast: Secondary | ICD-10-CM | POA: Diagnosis not present

## 2021-10-16 ENCOUNTER — Other Ambulatory Visit: Payer: Self-pay | Admitting: Family Medicine

## 2021-11-05 ENCOUNTER — Ambulatory Visit
Admission: RE | Admit: 2021-11-05 | Discharge: 2021-11-05 | Disposition: A | Discharge status: Home / Self Care | Payer: Medicare Other | Source: Ambulatory Visit | Attending: Adult Health | Admitting: Adult Health

## 2021-11-05 DIAGNOSIS — C50212 Malignant neoplasm of upper-inner quadrant of left female breast: Secondary | ICD-10-CM

## 2021-11-05 HISTORY — DX: Personal history of irradiation: Z92.3

## 2021-11-13 ENCOUNTER — Other Ambulatory Visit: Payer: Self-pay | Admitting: Family Medicine

## 2021-11-20 NOTE — Progress Notes (Signed)
Patient Care Team: [name], DO as PCP - General (Family Medicine) Minus Breeding, MD as PCP - Cardiology (Cardiology) Thompson Grayer, MD as PCP - Electrophysiology (Cardiology) [name], MD as Consulting Physician (Hematology and Oncology) Ronald Lobo, MD as Consulting Physician (Gastroenterology) Donnie Mesa, MD as Consulting Physician (General Surgery) [facility] [name], MD as Attending Physician (Radiation Oncology)  DIAGNOSIS:    ICD-10-CM   1. Carcinoma of upper-inner quadrant of left breast in female, estrogen receptor positive ([place])  C50.212    Z17.0       SUMMARY OF ONCOLOGIC HISTORY: Oncology History  Carcinoma of upper-inner quadrant of left breast in female, estrogen receptor positive ([place])  12/20/2020 Initial Diagnosis   Screening mammogram detected a left breast asymmetry. Diagnostic mammogram and US showed a 0.7cm mass at the 10 o'clock position and normal left axillary lymph nodes. Biopsy showed invasive lobular carcinoma, grade 1-2, HER-2 negative (1+), ER+ 50%, PR+ 90%, Ki67 10%.   12/25/2020 Cancer Staging   Staging form: Breast, AJCC 8th Edition - Clinical: Stage IA (cT1b, cN0, cM0, G2, ER+, PR+, HER2-)   01/07/2021 Surgery   Left lumpectomy ([facility]) ([id number]): invasive and in situ lobular carcinoma, grade 2, 1.8cm, clear margins, and no evidence of carcinoma in 3 left axillary lymph nodes.   01/20/2021 Oncotype testing   The Oncotype DX score was 10 predicting a risk of outside the breast recurrence over the next 9 years of 3% if the patient's only systemic therapy is tamoxifen for 5 years.     02/18/2021 - 03/12/2021 Radiation Therapy   The patient initially received a dose of 42.56 Gy in 16 fractions to the breast using whole-breast tangent fields. This was delivered using a 3-D conformal technique.   02/2021 - 02/2026 Anti-estrogen oral therapy   Anastrozole     CHIEF COMPLIANT: Follow-up of left breast  cancer  INTERVAL HISTORY: [name] Collier is a 75 y.o. with above-mentioned history of  left breast cancer having undergone lumpectomy. Mammogram on 11/05/2021 showed no evidence of malignancy. She presents to the clinic today for follow-up. She has occasional hot flashes maybe once a week.  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  Denies any other side effects to anastrozole therapy.  ALLERGIES:  is allergic to augmentin [amoxicillin-pot clavulanate], codeine, and prednisone.  MEDICATIONS:  Current Outpatient Medications  Medication Sig Dispense Refill   acetaminophen (TYLENOL) 500 MG tablet Take 1,000 mg by mouth every 6 (six) hours as needed for moderate pain or headache.     amLODipine (NORVASC) 5 MG tablet TAKE 1 AND 1/2 TABLETS BY  MOUTH DAILY 135 tablet 0   anastrozole (ARIMIDEX) 1 MG tablet Take 1 tablet (1 mg total) by mouth daily. 90 tablet 3   Calcium Carbonate-Vit D-Min (CALCIUM 1200 PO) Take 1,200 mg by mouth every morning.     Cholecalciferol (VITAMIN D3) 5000 units CAPS Take 5,000 Units by mouth daily with supper.     dofetilide (TIKOSYN) 500 MCG capsule TAKE 1 CAPSULE BY MOUTH 2 TIMES DAILY. 180 capsule 2   DULoxetine (CYMBALTA) 30 MG capsule TAKE 1 CAPSULE BY MOUTH  DAILY 90 capsule 3   ELIQUIS 5 MG TABS tablet TAKE 1 TABLET BY MOUTH TWICE A DAY 60 tablet 11   ezetimibe (ZETIA) 10 MG tablet TAKE 1 TABLET BY MOUTH  DAILY 90 tablet 0   famotidine (PEPCID) 20 MG tablet TAKE 1 TABLET BY MOUTH  TWICE DAILY 180 tablet 0   furosemide (LASIX)  20 MG tablet TAKE 1 TABLET BY MOUTH  DAILY 90 tablet 0   olmesartan (BENICAR) 5 MG tablet TAKE 1 TABLET BY MOUTH  DAILY 90 tablet 0   potassium chloride SA (KLOR-CON) 20 MEQ tablet TAKE 1 TABLET BY MOUTH  TWICE DAILY 180 tablet 0   No current facility-administered medications for this visit.    PHYSICAL EXAMINATION: ECOG PERFORMANCE STATUS: 1 - Symptomatic but completely ambulatory  Vitals:   11/21/21 1117  BP: (!) 174/68  Pulse: 76   Resp: 18  Temp: 98.4 F (36.9 C)  SpO2: 95%   Filed Weights   11/21/21 1117  Weight: 284 lb (128.8 kg)      LABORATORY DATA:  I have reviewed the data as listed CMP Latest Ref Rng & Units 07/04/2021 07/01/2021 06/30/2021  Glucose 65 - 99 mg/dL 103(H) 107(H) 120(H)  BUN 8 - 27 mg/dL 11 9 <5(L)  Creatinine 0.57 - 1.00 mg/dL 0.73 0.70 0.66  Sodium 134 - 144 mmol/L 143 140 138  Potassium 3.5 - 5.2 mmol/L 4.9 4.2 3.7  Chloride 96 - 106 mmol/L 105 104 104  CO2 20 - 29 mmol/L 22 25 26   Calcium 8.7 - 10.3 mg/dL 9.4 8.9 9.2  Total Protein 6.5 - 8.1 g/dL - - 6.7  Total Bilirubin 0.3 - 1.2 mg/dL - - 1.8(H)  Alkaline Phos 38 - 126 U/L - - 70  AST 15 - 41 U/L - - 16  ALT 0 - 44 U/L - - 15    Lab Results  Component Value Date   WBC 5.1 07/04/2021   HGB 12.4 07/04/2021   HCT 37.5 07/04/2021   MCV 88 07/04/2021   PLT 293 07/04/2021   NEUTROABS 3.2 07/04/2021    ASSESSMENT & PLAN:  Carcinoma of upper-inner quadrant of left breast in female, estrogen receptor positive ([place]) 12/20/2020: Screening mammogram detected left breast asymmetry.  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   Oncotype Dx 10: ROR 3%   Plan: 1. Adj RT completed 03/12/2021 2. Foll by Adj Anti-estrogen therapy started March 2022   Anastrozole toxicities: Denies any adverse effects to anastrozole.  Very occasional hot flashes. Chronic osteoarthritis of bilateral knees: Making it difficult for her to exercise.  Breast cancer surveillance: 1.  Breast exam will be done in July 2023 2. mammogram 11/05/2021: Benign breast density category C  Return to clinic in July 2023 for follow-up and  after that we can see her once a year.  No orders of the defined types were placed in this encounter.  The patient has a good understanding of the overall plan. she agrees with it. she will call with any problems that may develop before the next visit here.  Total time spent: 20 mins including face to face time and time spent for planning, charting and coordination of care  Rulon Eisenmenger, MD, MPH 11/21/2021  I, Thana Ates, am acting as scribe for Dr. [name].  I have reviewed the above documentation for accuracy and completeness, and I agree with the above.

## 2021-11-20 NOTE — Assessment & Plan Note (Signed)
12/20/2020: Screening mammogram detected left breast asymmetry. Ultrasound revealed 0.7 cm mass at 10 o'clock position, axilla negative, biopsy revealed grade 1-2 invasive lobular cancer, ER 50%, PR 90%, Ki-67 10%, HER2 negative by IHC 1+ T1BN0 stage Ia clinical stage  01/07/2021: Left lumpectomy ([facility]): invasive and in situ lobular carcinoma, grade 2, 1.8cm, clear margins, and no evidence of carcinoma in 3 left axillary lymph nodes.  Oncotype Dx 10: ROR 3%  Plan: 1. Adj RT 2. Foll by Adj Anti-estrogen therapy  Anastrozole toxicities:  Breast cancer surveillance: 1.  Breast exam 11/21/2021: Benign 2. mammogram 11/05/2021: Benign breast density category C

## 2021-11-21 ENCOUNTER — Inpatient Hospital Stay: Payer: Medicare Other | Attending: Hematology and Oncology | Admitting: Hematology and Oncology

## 2021-11-21 ENCOUNTER — Telehealth: Payer: Self-pay | Admitting: Hematology and Oncology

## 2021-11-21 ENCOUNTER — Other Ambulatory Visit: Payer: Self-pay

## 2021-11-21 DIAGNOSIS — Z170 Estrogen receptor positive status [ER+]: Secondary | ICD-10-CM | POA: Insufficient documentation

## 2021-11-21 DIAGNOSIS — C50212 Malignant neoplasm of upper-inner quadrant of left female breast: Secondary | ICD-10-CM | POA: Diagnosis not present

## 2021-11-21 NOTE — Telephone Encounter (Signed)
Called to update patient on changes made to upcoming appointment. Left message.

## 2021-12-02 ENCOUNTER — Encounter: Payer: Self-pay | Admitting: Family Medicine

## 2021-12-02 ENCOUNTER — Ambulatory Visit (INDEPENDENT_AMBULATORY_CARE_PROVIDER_SITE_OTHER): Payer: Medicare Other | Admitting: Family Medicine

## 2021-12-02 VITALS — BP 145/79 | HR 84 | Temp 97.5°F | Ht 66.0 in | Wt 284.0 lb

## 2021-12-02 DIAGNOSIS — Z7901 Long term (current) use of anticoagulants: Secondary | ICD-10-CM

## 2021-12-02 DIAGNOSIS — F411 Generalized anxiety disorder: Secondary | ICD-10-CM | POA: Diagnosis not present

## 2021-12-02 DIAGNOSIS — Z170 Estrogen receptor positive status [ER+]: Secondary | ICD-10-CM

## 2021-12-02 DIAGNOSIS — F320 Major depressive disorder, single episode, mild: Secondary | ICD-10-CM

## 2021-12-02 DIAGNOSIS — N3281 Overactive bladder: Secondary | ICD-10-CM | POA: Diagnosis not present

## 2021-12-02 DIAGNOSIS — I480 Paroxysmal atrial fibrillation: Secondary | ICD-10-CM | POA: Diagnosis not present

## 2021-12-02 DIAGNOSIS — I10 Essential (primary) hypertension: Secondary | ICD-10-CM

## 2021-12-02 DIAGNOSIS — C50212 Malignant neoplasm of upper-inner quadrant of left female breast: Secondary | ICD-10-CM | POA: Diagnosis not present

## 2021-12-02 DIAGNOSIS — Z0000 Encounter for general adult medical examination without abnormal findings: Secondary | ICD-10-CM

## 2021-12-02 DIAGNOSIS — E7800 Pure hypercholesterolemia, unspecified: Secondary | ICD-10-CM

## 2021-12-02 DIAGNOSIS — Z0001 Encounter for general adult medical examination with abnormal findings: Secondary | ICD-10-CM | POA: Diagnosis not present

## 2021-12-02 DIAGNOSIS — L219 Seborrheic dermatitis, unspecified: Secondary | ICD-10-CM

## 2021-12-02 LAB — BAYER DCA HB A1C WAIVED: HB A1C (BAYER DCA - WAIVED): 5.3 % (ref 4.8–5.6)

## 2021-12-02 MED ORDER — FUROSEMIDE 20 MG PO TABS: 20.0000 mg | ORAL_TABLET | Freq: Every day | ORAL | 3 refills | Status: DC

## 2021-12-02 MED ORDER — EZETIMIBE 10 MG PO TABS: 10.0000 mg | ORAL_TABLET | Freq: Every day | ORAL | 3 refills | Status: DC

## 2021-12-02 MED ORDER — FAMOTIDINE 20 MG PO TABS: 20.0000 mg | ORAL_TABLET | Freq: Two times a day (BID) | ORAL | 3 refills | Status: DC

## 2021-12-02 MED ORDER — OLMESARTAN MEDOXOMIL 5 MG PO TABS: 5.0000 mg | ORAL_TABLET | Freq: Every day | ORAL | 3 refills | Status: DC

## 2021-12-02 MED ORDER — AMLODIPINE BESYLATE 5 MG PO TABS: 7.5000 mg | ORAL_TABLET | Freq: Every day | ORAL | 3 refills | Status: DC

## 2021-12-02 MED ORDER — POTASSIUM CHLORIDE CRYS ER 20 MEQ PO TBCR: 20.0000 meq | EXTENDED_RELEASE_TABLET | Freq: Two times a day (BID) | ORAL | 3 refills | Status: DC

## 2021-12-02 MED ORDER — KETOCONAZOLE 2 % EX SHAM: 1.0000 "application " | MEDICATED_SHAMPOO | CUTANEOUS | 0 refills | Status: DC

## 2021-12-02 MED ORDER — MIRABEGRON ER 25 MG PO TB24: 25.0000 mg | ORAL_TABLET | Freq: Every day | ORAL | 0 refills | Status: DC

## 2021-12-02 MED ORDER — DULOXETINE HCL 30 MG PO CPEP: 30.0000 mg | ORAL_CAPSULE | Freq: Every day | ORAL | 3 refills | Status: DC

## 2021-12-02 NOTE — Progress Notes (Signed)
[name] Collier is a 75 y.o. female presents to office today for annual physical exam examination.    Concerns today include: 1.  Overactive bladder Patient reports that she urinates about every 2 hours overnight.  She is on Lasix daily.  She also takes potassium daily.  No reports of dysuria, hematuria.  Occupation: Retired,   Substance use: None Diet: Fair, Exercise: No structured secondary to bilateral knee pain Last eye exam: Up-to-date Last dental exam: Up-to-date Last colonoscopy: Up-to-date, Dr. Cristina Gong recently retired Last mammogram: Up-to-date.  Seeing oncology alternating with breast surgery every 6 months Last pap smear: N/A Refills needed today: N/A Immunizations needed: Immunization History  Administered Date(s) Administered   Moderna Sars-Covid-2 Vaccination 02/29/2020, 03/28/2020, 12/20/2020   Pneumococcal Conjugate-13 12/30/2016   Pneumococcal Polysaccharide-23 02/08/2018     Past Medical History:  Diagnosis Date   Anxiety    Atrial fibrillation ([place])    Cancer ([place]) 11/2020   left breast IMC   GERD (gastroesophageal reflux disease)    HTN (hypertension)    Hyperlipidemia    Personal history of radiation therapy    TIA (transient ischemic attack)    Social History   Socioeconomic History   Marital status: Widowed    Spouse name: Not on file   Number of children: 2   Years of education: 73   Highest education level: Not on file  Occupational History   Occupation: Retired    Comment: Personnel officer  Tobacco Use   Smoking status: Never   Smokeless tobacco: Never  Scientific laboratory technician Use: Never used  Substance and Sexual Activity   Alcohol use: No    Alcohol/week: 0.0 standard drinks   Drug use: No   Sexual activity: Not Currently    Birth control/protection: Post-menopausal  Other Topics Concern   Not on file  Social History Narrative   Lives alone - children in [place] -    Caffeine use: Soda/tea daily   Good church  support group   Social Determinants of Health   Financial Resource Strain: Low Risk    Difficulty of Paying Living Expenses: Not hard at all  Food Insecurity: No Food Insecurity   Worried About Charity fundraiser in the Last Year: Never true   Arboriculturist in the Last Year: Never true  Transportation Needs: No Transportation Needs   Lack of Transportation (Medical): No   Lack of Transportation (Non-Medical): No  Physical Activity: Insufficiently Active   Days of Exercise per Week: 7 days   Minutes of Exercise per Session: 10 min  Stress: Stress Concern Present   Feeling of Stress : To some extent  Social Connections: Moderately Integrated   Frequency of Communication with Friends and Family: More than three times a week   Frequency of Social Gatherings with Friends and Family: More than three times a week   Attends Religious Services: More than 4 times per year   Active Member of [employer] or Organizations: Yes   Attends Archivist Meetings: More than 4 times per year   Marital Status: Widowed  Intimate Partner Violence: Not At Risk   Fear of Current or Ex-Partner: No   Emotionally Abused: No   Physically Abused: No   Sexually Abused: No   Past Surgical History:  Procedure Laterality Date   BOTOX INJECTION N/A 12/22/2018   Procedure: BOTOX INJECTION INTO ANAL SPHINCTER;  Surgeon: Ileana Roup, MD;  Location: [facility];  Service: General;  Laterality: N/A;   BREAST BIOPSY Left 11/26/2020   BREAST LUMPECTOMY WITH RADIOACTIVE SEED AND SENTINEL LYMPH NODE BIOPSY Left 01/07/2021   Procedure: LEFT BREAST LUMPECTOMY WITH RADIOACTIVE SEED AND LEFT AXILLARY SENTINEL LYMPH NODE BIOPSY WITH BLUE DYE INJECTION;  Surgeon: Donnie Mesa, MD;  Location: [facility];  Service: General;  Laterality: Left;  LMA WITH PECTORAL BLOCK, BLUE DYE INJECTION   CARDIOVERSION N/A 01/04/2018   Procedure: CARDIOVERSION;  Surgeon: Dorothy Spark, MD;  Location: [facility];   Service: Cardiovascular;  Laterality: N/A;   CARDIOVERSION N/A 09/20/2018   Procedure: CARDIOVERSION;  Surgeon: Sueanne Margarita, MD;  Location: [facility];  Service: Cardiovascular;  Laterality: N/A;   CARDIOVERSION N/A 10/27/2018   Procedure: CARDIOVERSION;  Surgeon: Buford Dresser, MD;  Location: [facility];  Service: Cardiovascular;  Laterality: N/A;   KNEE SURGERY Bilateral    Torn miniscus   RECTAL EXAM UNDER ANESTHESIA N/A 12/22/2018   Procedure: ANORECTAL  EXAM UNDER ANESTHESIA;  Surgeon: Ileana Roup, MD;  Location: [facility];  Service: General;  Laterality: N/A;   RECTOCELE REPAIR     TONSILLECTOMY     VAGINAL HYSTERECTOMY     Family History  Problem Relation Age of Onset   Hypertension Mother    Hyperlipidemia Mother    Neurologic Disorder Mother 49       GB   Stroke Son    Stroke Maternal Uncle    Stroke Grandchild    Prostate cancer Brother     Current Outpatient Medications:    acetaminophen (TYLENOL) 500 MG tablet, Take 1,000 mg by mouth every 6 (six) hours as needed for moderate pain or headache., Disp: , Rfl:    amLODipine (NORVASC) 5 MG tablet, TAKE 1 AND 1/2 TABLETS BY  MOUTH DAILY, Disp: 135 tablet, Rfl: 0   anastrozole (ARIMIDEX) 1 MG tablet, Take 1 tablet (1 mg total) by mouth daily., Disp: 90 tablet, Rfl: 3   Calcium Carbonate-Vit D-Min (CALCIUM 1200 PO), Take 1,200 mg by mouth every morning., Disp: , Rfl:    Cholecalciferol (VITAMIN D3) 5000 units CAPS, Take 5,000 Units by mouth daily with supper., Disp: , Rfl:    dofetilide (TIKOSYN) 500 MCG capsule, TAKE 1 CAPSULE BY MOUTH 2 TIMES DAILY., Disp: 180 capsule, Rfl: 2   DULoxetine (CYMBALTA) 30 MG capsule, TAKE 1 CAPSULE BY MOUTH  DAILY, Disp: 90 capsule, Rfl: 3   ELIQUIS 5 MG TABS tablet, TAKE 1 TABLET BY MOUTH TWICE A DAY, Disp: 60 tablet, Rfl: 11   ezetimibe (ZETIA) 10 MG tablet, TAKE 1 TABLET BY MOUTH  DAILY, Disp: 90 tablet, Rfl: 0   famotidine (PEPCID) 20 MG tablet, TAKE 1 TABLET BY MOUTH   TWICE DAILY, Disp: 180 tablet, Rfl: 0   furosemide (LASIX) 20 MG tablet, TAKE 1 TABLET BY MOUTH  DAILY, Disp: 90 tablet, Rfl: 0   olmesartan (BENICAR) 5 MG tablet, TAKE 1 TABLET BY MOUTH  DAILY, Disp: 90 tablet, Rfl: 0   potassium chloride SA (KLOR-CON) 20 MEQ tablet, TAKE 1 TABLET BY MOUTH  TWICE DAILY, Disp: 180 tablet, Rfl: 0  Allergies  Allergen Reactions   Augmentin [Amoxicillin-Pot Clavulanate] Other (See Comments)    c-diff Has patient had a PCN reaction causing immediate rash, facial/tongue/throat swelling, SOB or lightheadedness with hypotension: No Has patient had a PCN reaction causing severe rash involving mucus membranes or skin necrosis: No Has patient had a PCN reaction that required hospitalization: No Has patient had a PCN reaction occurring within the last  10 years: Yes If all of the above answers are "NO", then may proceed with Cephalosporin use.    Codeine Palpitations and Rash   Prednisone Other (See Comments)    Jittery, red in the face     ROS: Review of Systems Pertinent items noted in HPI and remainder of comprehensive ROS otherwise negative.    Physical exam BP (!) 145/79    Pulse 84    Temp (!) 97.5 F (36.4 C)    Ht 5' 6"  (1.676 m)    Wt 284 lb (128.8 kg)    SpO2 96%    BMI 45.84 kg/m  General appearance: alert, cooperative, appears stated age, no distress, and morbidly obese Head: Normocephalic, without obvious abnormality, atraumatic Eyes: negative findings: lids and lashes normal, conjunctivae and sclerae normal, corneas clear, pupils equal, round, reactive to light and accomodation, and evidence of previous cataract surgery Ears: normal TM's and external ear canals both ears Nose: Nares normal. Septum midline. Mucosa normal. No drainage or sinus tenderness. Throat: lips, mucosa, and tongue normal; teeth and gums normal Neck: no adenopathy, no carotid bruit, supple, symmetrical, trachea midline, and thyroid not enlarged, symmetric, no  tenderness/mass/nodules Back: symmetric, no curvature. ROM normal. No CVA tenderness. Lungs: clear to auscultation bilaterally Heart:  Regular rate and rhythm.  S1, S2 heard. 2/6 systolic ejection murmur appreciated Abdomen: soft, non-tender; bowel sounds normal; no masses,  no organomegaly and obese Extremities: extremities normal, atraumatic, no cyanosis or edema Pulses: 2+ and symmetric Skin:  She has an area of purpura noted along the left forearm ; flaky, greasy appearing skin noted on the left brow Lymph nodes: Cervical, supraclavicular, and axillary nodes normal. Neurologic: Grossly normal Psych: Mood slightly depressed.  Patient very pleasant, interactive  Depression screen [facility] 2/9 12/02/2021 07/04/2021 05/30/2021  Decreased Interest 2 0 1  Down, Depressed, Hopeless 1 - 2  PHQ - 2 Score 3 0 3  Altered sleeping 1 - 1  Tired, decreased energy 2 - 1  Change in appetite 3 - 3  Feeling bad or failure about yourself  1 - 1  Trouble concentrating 1 - 1  Moving slowly or fidgety/restless 1 - 0  Suicidal thoughts 0 - 0  PHQ-9 Score 12 - 10  Difficult doing work/chores Somewhat difficult - Somewhat difficult  Some recent data might be hidden   GAD 7 : Generalized Anxiety Score 12/02/2021 05/30/2021 02/19/2020 07/21/2019  Nervous, Anxious, on Edge 1 2 2 1   Control/stop worrying 2 2 2 1   Worry too much - different things 2 2 3 1   Trouble relaxing 2 2 3 1   Restless 1 1 2 1   Easily annoyed or irritable 0 1 0 1  Afraid - awful might happen 1 1 2 1   Total GAD 7 Score 9 11 14 7   Anxiety Difficulty Somewhat difficult Somewhat difficult Not difficult at all Somewhat difficult    Assessment/ Plan: [name] here for annual physical exam.   Annual physical exam  PAF (paroxysmal atrial fibrillation) ([place]) - Plan: CMP14+EGFR, TSH, CBC, AMB Referral to Community Care Coordinaton  Chronic anticoagulation - Plan: CBC, AMB Referral to [facility]  Carcinoma of upper-inner  quadrant of left breast in female, estrogen receptor positive ([place]) - Plan: CBC, AMB Referral to Community Care Coordinaton  Essential hypertension - Plan: CMP14+EGFR, Bayer DCA Hb A1c Waived  Pure hypercholesterolemia - Plan: CMP14+EGFR, Lipid Panel, TSH, Bayer DCA Hb A1c Waived  Morbid obesity ([place]) - Plan: Lipid Panel, TSH, Bayer  DCA Hb A1c Waived  Overactive bladder - Plan: mirabegron ER (MYRBETRIQ) 25 MG TB24 tablet, DISCONTINUED: mirabegron ER (MYRBETRIQ) 25 MG TB24 tablet  Generalized anxiety disorder - Plan: DULoxetine (CYMBALTA) 30 MG capsule, AMB Referral to Community Care Coordinaton  Depression, major, single episode, mild ([place]) - Plan: DULoxetine (CYMBALTA) 30 MG capsule, AMB Referral to Community Care Coordinaton  Seborrheic dermatitis - Plan: ketoconazole (NIZORAL) 2 % shampoo  Due for shingles and influenza vaccinations but declines.  Currently anticoagulated for atrial fibrillation.  Referral to CCM for medication assistance with Eliquis placed  Blood pressure was at goal.  She will continue to follow-up with her specialist for breast cancer surveillance.  She did report overactive bladder today.  We will trial her on Myrbetriq 25 mg.  Unfortunately I had no samples to provide her today.  Pertaining to obesity we discussed consideration for [facility].  I have renewed her Cymbalta for anxiety and depression.  Though her scores still are on the high side.  This is likely multifactorial she is had quite a hard year with multiple illnesses and a cancer diagnosis as well as some issues pertaining to her granddaughter's health.  I have referred her to CCM for some counseling services as well but we certainly could consider advancing the Cymbalta to 60 mg if she desired  She we will trial Nizoral on the affected areas of the brows and face.  If this does not resolve her symptoms, we will have her follow-up with dermatology  Counseled on healthy lifestyle choices, including diet  (rich in fruits, vegetables and lean meats and low in salt and simple carbohydrates) and exercise (at least 30 minutes of moderate physical activity daily).  Patient to follow up in 1 year for annual exam or sooner if needed.  Valentin Benney M. Lajuana Ripple, DO

## 2021-12-03 ENCOUNTER — Telehealth: Payer: Self-pay

## 2021-12-03 ENCOUNTER — Other Ambulatory Visit: Payer: Self-pay | Admitting: Family Medicine

## 2021-12-03 DIAGNOSIS — R7989 Other specified abnormal findings of blood chemistry: Secondary | ICD-10-CM

## 2021-12-03 LAB — CMP14+EGFR
ALT: 19 IU/L (ref 0–32)
AST: 22 IU/L (ref 0–40)
Albumin/Globulin Ratio: 1.3 (ref 1.2–2.2)
Albumin: 4 g/dL (ref 3.7–4.7)
Alkaline Phosphatase: 84 IU/L (ref 44–121)
BUN/Creatinine Ratio: 19 (ref 12–28)
BUN: 15 mg/dL (ref 8–27)
Bilirubin Total: 0.8 mg/dL (ref 0.0–1.2)
CO2: 24 mmol/L (ref 20–29)
Calcium: 9.3 mg/dL (ref 8.7–10.3)
Chloride: 106 mmol/L (ref 96–106)
Creatinine, Ser: 0.78 mg/dL (ref 0.57–1.00)
Globulin, Total: 3 g/dL (ref 1.5–4.5)
Glucose: 105 mg/dL — ABNORMAL HIGH (ref 70–99)
Potassium: 4.6 mmol/L (ref 3.5–5.2)
Sodium: 143 mmol/L (ref 134–144)
Total Protein: 7 g/dL (ref 6.0–8.5)
eGFR: 79 mL/min/{1.73_m2} (ref >59)

## 2021-12-03 LAB — LIPID PANEL
Chol/HDL Ratio: 3.2 ratio (ref 0.0–4.4)
Cholesterol, Total: 188 mg/dL (ref 100–199)
HDL: 59 mg/dL (ref >39)
LDL Chol Calc (NIH): 105 mg/dL — ABNORMAL HIGH (ref 0–99)
Triglycerides: 134 mg/dL (ref 0–149)
VLDL Cholesterol Cal: 24 mg/dL (ref 5–40)

## 2021-12-03 LAB — CBC
Hematocrit: 39.5 % (ref 34.0–46.6)
Hemoglobin: 12.9 g/dL (ref 11.1–15.9)
MCH: 29 pg (ref 26.6–33.0)
MCHC: 32.7 g/dL (ref 31.5–35.7)
MCV: 89 fL (ref 79–97)
Platelets: 254 10*3/uL (ref 150–450)
RBC: 4.45 x10E6/uL (ref 3.77–5.28)
RDW: 12.7 % (ref 11.7–15.4)
WBC: 5.1 10*3/uL (ref 3.4–10.8)

## 2021-12-03 LAB — TSH: TSH: 5.26 u[IU]/mL — ABNORMAL HIGH (ref 0.450–4.500)

## 2021-12-03 NOTE — Chronic Care Management (AMB) (Signed)
Chronic Care Management   Note  12/03/2021 Name: [name] Collier MRN: [id number] DOB: 25-Jan-1946  [name] Collier is a 75 y.o. year old female who is a primary care patient of [name] Doss Collier, [name] Collier. I reached out to [name] Collier by phone today in response to a referral sent by [name] Collier's PCP.  [name] Collier was given information about Chronic Care Management services today including:  CCM service includes personalized support from designated clinical staff supervised by her physician, including individualized plan of care and coordination with other care providers 24/7 contact phone numbers for assistance for urgent and routine care needs. Service will only be billed when office clinical staff spend 20 minutes or more in a month to coordinate care. Only one practitioner may furnish and bill the service in a calendar month. The patient may stop CCM services at any time (effective at the end of the month) by phone call to the office staff. The patient is responsible for co-pay (up to 20% after annual deductible is met) if co-pay is required by the individual health plan.   Patient agreed to services and verbal consent obtained.   Follow up plan: Telephone appointment with care management team member scheduled for: LCSW 12/10/2021 Pharm D 01/09/2022  Noreene Larsson, [address], [place], [place] [address] Direct Dial: [phone] [email or web address] Website: [email or web address]

## 2021-12-03 NOTE — Chronic Care Management (AMB) (Signed)
°  Chronic Care Management   Outreach Note  12/03/2021 Name: [name] Collier MRN: [id number] DOB: 15-Nov-1946  [name] is a 75 y.o. year old female who is a primary care patient of Ronnie Doss M, DO. I reached out to [name] by phone today in response to a referral sent by [name]'s primary care provider.  An unsuccessful telephone outreach was attempted today. The patient was referred to the case management team for assistance with care management and care coordination.   Follow Up Plan: A HIPAA compliant phone message was left for the patient providing contact information and requesting a return call.  The care management team will reach out to the patient again over the next 5 days.  If patient returns call to provider office, please advise to call [facility] at [place], [place], [place], [place] [address] Direct Dial: [phone] [email or web address]: [email or web address]

## 2021-12-10 ENCOUNTER — Ambulatory Visit (INDEPENDENT_AMBULATORY_CARE_PROVIDER_SITE_OTHER): Payer: Medicare Other | Admitting: Licensed Clinical Social Worker

## 2021-12-10 DIAGNOSIS — F411 Generalized anxiety disorder: Secondary | ICD-10-CM

## 2021-12-10 DIAGNOSIS — I10 Essential (primary) hypertension: Secondary | ICD-10-CM

## 2021-12-10 DIAGNOSIS — F320 Major depressive disorder, single episode, mild: Secondary | ICD-10-CM

## 2021-12-10 DIAGNOSIS — G459 Transient cerebral ischemic attack, unspecified: Secondary | ICD-10-CM

## 2021-12-10 DIAGNOSIS — I480 Paroxysmal atrial fibrillation: Secondary | ICD-10-CM

## 2021-12-10 NOTE — Chronic Care Management (AMB) (Signed)
Chronic Care Management    Clinical Social Work Note  12/10/2021 Name: [name] Collier MRN: [id number] DOB: February 06, 1946  [name] Collier is a 76 y.o. year old female who is a primary care patient of [name], DO. The CCM team was consulted to assist the patient with chronic disease management and/or care coordination needs related to: [employer] .   Engaged with patient by telephone for initial visit in response to provider referral for social work chronic care management and care coordination services.   Consent to Services:  The patient was given the following information about Chronic Care Management services today, agreed to services, and gave verbal consent: 1. CCM service includes personalized support from designated clinical staff supervised by the primary care provider, including individualized plan of care and coordination with other care providers 2. 24/7 contact phone numbers for assistance for urgent and routine care needs. 3. Service will only be billed when office clinical staff spend 20 minutes or more in a month to coordinate care. 4. Only one practitioner may furnish and bill the service in a calendar month. 5.The patient may stop CCM services at any time (effective at the end of the month) by phone call to the office staff. 6. The patient will be responsible for cost sharing (co-pay) of up to 20% of the service fee (after annual deductible is met). Patient agreed to services and consent obtained.  Patient agreed to services and consent obtained.   Assessment: Review of patient past medical history, allergies, medications, and health status, including review of relevant consultants reports was performed today as part of a comprehensive evaluation and provision of chronic care management and care coordination services.     SDOH (Social Determinants of Health) assessments and interventions performed:  SDOH Interventions    Flowsheet Row Most Recent Value  SDOH  Interventions   Physical Activity Interventions Other (Comments)  [client has some walking challenges. client has knee pain issues]  Stress Interventions Provide Counseling  [client has stress related to managing medical needs]  Depression Interventions/Treatment  Medication, Currently on Treatment        Advanced Directives Status: See Vynca application for related entries.  CCM Care Plan  Allergies  Allergen Reactions   Augmentin [Amoxicillin-Pot Clavulanate] Other (See Comments)    c-diff Has patient had a PCN reaction causing immediate rash, facial/tongue/throat swelling, SOB or lightheadedness with hypotension: No Has patient had a PCN reaction causing severe rash involving mucus membranes or skin necrosis: No Has patient had a PCN reaction that required hospitalization: No Has patient had a PCN reaction occurring within the last 10 years: Yes If all of the above answers are "NO", then may proceed with Cephalosporin use.    Codeine Palpitations and Rash   Prednisone Other (See Comments)    Jittery, red in the face    Outpatient Encounter Medications as of 12/10/2021  Medication Sig   acetaminophen (TYLENOL) 500 MG tablet Take 1,000 mg by mouth every 6 (six) hours as needed for moderate pain or headache.   amLODipine (NORVASC) 5 MG tablet Take 1.5 tablets (7.5 mg total) by mouth daily.   anastrozole (ARIMIDEX) 1 MG tablet Take 1 tablet (1 mg total) by mouth daily.   Calcium Carbonate-Vit D-Min (CALCIUM 1200 PO) Take 1,200 mg by mouth every morning.   Cholecalciferol (VITAMIN D3) 5000 units CAPS Take 5,000 Units by mouth daily with supper.   dofetilide (TIKOSYN) 500 MCG capsule TAKE 1 CAPSULE BY MOUTH 2 TIMES DAILY.  DULoxetine (CYMBALTA) 30 MG capsule Take 1 capsule (30 mg total) by mouth daily.   ELIQUIS 5 MG TABS tablet TAKE 1 TABLET BY MOUTH TWICE A DAY   ezetimibe (ZETIA) 10 MG tablet Take 1 tablet (10 mg total) by mouth daily.   famotidine (PEPCID) 20 MG tablet Take 1  tablet (20 mg total) by mouth 2 (two) times daily.   furosemide (LASIX) 20 MG tablet Take 1 tablet (20 mg total) by mouth daily.   ketoconazole (NIZORAL) 2 % shampoo Apply 1 application topically 2 (two) times a week. Lather, leave on for 10 minutes then rinse off.   mirabegron ER (MYRBETRIQ) 25 MG TB24 tablet Take 1 tablet (25 mg total) by mouth daily.   olmesartan (BENICAR) 5 MG tablet Take 1 tablet (5 mg total) by mouth daily.   potassium chloride SA (KLOR-CON M) 20 MEQ tablet Take 1 tablet (20 mEq total) by mouth 2 (two) times daily.   No facility-administered encounter medications on file as of 12/10/2021.    Patient Active Problem List   Diagnosis Date Noted   Encounter for support and coordination of transition of care 07/04/2021   Spleen injury 06/29/2021   Carcinoma of upper-inner quadrant of left breast in female, estrogen receptor positive ([place]) 12/20/2020   Trigger thumb of left hand 06/28/2019   Depression, major, single episode, mild ([facility]) 03/14/2019   Generalized anxiety disorder 03/14/2019   PAF (paroxysmal atrial fibrillation) ([place])    Osteoarthritis of knee 04/20/2018   Post-menopausal 02/22/2018   Snoring 01/12/2018   Hypokalemia 01/12/2018   Encounter for cardioversion procedure    Persistent atrial fibrillation 11/11/2017   SOB (shortness of breath) 11/11/2017   Other fatigue 11/11/2017   Hyperlipemia 04/16/2017   Weakness of left hand 05/11/2016   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 Manage depression issues. Manage Anxiety issues   Start Date: 12/10/2021  Expected End Date: 03/09/2022   This Visit's Progress: On track  Priority: Medium  Note:   Current Barriers:  Some mobility issues Knee pain issues Depression issues; some anxiety issues Suicidal Ideation/Homicidal Ideation: No  Clinical Social Work Goal(s):  patient will work with SW monthly by telephone or in person to reduce or manage symptoms related to depression issues and related to anxiety issues Patient will communicate as needed in next 30 days with RNCM to discuss nursing needs of client Patient will communicate as needed in next 30 days with Dr. Lottie Dawson, Pharmacist at [facility] to discuss medication questions of client  Interventions: Patient interviewed and appropriate assessments performed: GAD-7 completed; PHQ-2/9 completed 1:1 collaboration with [name], DO regarding development and update of comprehensive plan of care as evidenced by provider attestation and co-signature Discussed current client needs with [name] Discussed transport needs of client and discussed appetite of client Discussed CCM services with client. LCSW talked with Hanah about LCSW support, RNCM support and about Pharmacist support Reviewed upcoming client appointments. Marika stated that she has appointment on 12/30/21 at [facility] in [place] Discussed feelings of grief of client. December is the anniversary month for the death  of her spouse, who died 6 years ago.  LCSW talked with Maryagnes about grief issues experienced  and about family support in dealing with grief issues.  LCSW provided grief therapy for client. Reviewed medication procurement for client Reviewed mood status of client. She said she thought that generally her mood was stable. She is taking Cymbalta as prescribed. LCSW talked with client about self care. LCSW talked with client about her use of relaxation techniques Client said she likes to reading to relax; she said she does word puzzles also to help her relax. She is active in her church  and this is helpful for client Reviewed with Coretha her treatment history for breast cancer.  Errin discussed surgery experienced; she discussed radiation treatments experienced. She discussed her recent visit with oncologist Shigeko reported that she switched insurance providers. She now has [employer] as her insurance provider. She plans to call [facility] to inform billing department of her change in insurance provider Reviewed family support. Miaisabella reported that she has support from her 2 sons and from her 2 sisters. Encouraged Angelize to call Dr. Lottie Dawson, Pharmacist at [facility] to discuss medication questions of client. Client did mention that she takes Eliquis and that buying her monthly Eliquis medication is expensive for her Provided counseling support  Patient Self Care Activities:  Attends all scheduled provider appointments Attends church or other social activities Performs ADL's independently  Patient Coping Strengths:  [place]  Patient Self Care Deficits:  Some walking challenges Some pain issues Grief an depression issues  Patient Goals:  - spend time or talk with others at least 2 to 3 times per week - practice relaxation or meditation daily - keep a calendar with appointment dates  Follow Up Plan: LCSW to call client on 02/03/22 at 1:00 PM to assess client needs     Norva Riffle.Jennaya Pogue MSW, LCSW Licensed Clinical Social Worker [facility] Care Management [phone]

## 2021-12-10 NOTE — Patient Instructions (Addendum)
Visit Information  Patient Goals:  Depressive Symptoms identified. Manage depression symptoms. Manage Grief symptoms  Time Frame:  Short Term Goal Priority:  Medium Progress:  On Track  Start Date:  12/10/21 Expected End Date:  03/09/22  Follow Up Date:  02/03/22 at 1:00 PM  Depressive Symptoms Identified. Manage depression symptoms; Manage Grief symptoms  Patient Self Care Activities:  Attends all scheduled provider appointments Attends church or other social activities Performs ADL's independently  Patient Coping Strengths:  [place]  Patient Self Care Deficits:  Some walking challenges Some pain issues Grief an depression issues  Patient Goals:  - spend time or talk with others at least 2 to 3 times per week - practice relaxation or meditation daily - keep a calendar with appointment dates  Follow Up Plan: LCSW to call client on 02/03/22 at 1:00 PM to assess client needs   Norva Riffle.Tayvia Faughnan MSW, LCSW Licensed Clinical Social Worker [facility] Care Management [phone]

## 2021-12-30 ENCOUNTER — Ambulatory Visit (HOSPITAL_COMMUNITY)
Admission: RE | Admit: 2021-12-30 | Discharge: 2021-12-30 | Disposition: A | Discharge status: Home / Self Care | Payer: PPO | Source: Ambulatory Visit | Attending: Nurse Practitioner | Admitting: Nurse Practitioner

## 2021-12-30 ENCOUNTER — Encounter (HOSPITAL_COMMUNITY): Payer: Self-pay | Admitting: Nurse Practitioner

## 2021-12-30 ENCOUNTER — Other Ambulatory Visit: Payer: Self-pay

## 2021-12-30 VITALS — BP 164/70 | HR 64 | Ht 66.0 in | Wt 279.0 lb

## 2021-12-30 DIAGNOSIS — I4819 Other persistent atrial fibrillation: Secondary | ICD-10-CM | POA: Diagnosis not present

## 2021-12-30 DIAGNOSIS — I480 Paroxysmal atrial fibrillation: Secondary | ICD-10-CM | POA: Diagnosis not present

## 2021-12-30 DIAGNOSIS — D6869 Other thrombophilia: Secondary | ICD-10-CM | POA: Diagnosis not present

## 2021-12-30 DIAGNOSIS — Z8673 Personal history of transient ischemic attack (TIA), and cerebral infarction without residual deficits: Secondary | ICD-10-CM | POA: Insufficient documentation

## 2021-12-30 DIAGNOSIS — Z79811 Long term (current) use of aromatase inhibitors: Secondary | ICD-10-CM | POA: Diagnosis not present

## 2021-12-30 DIAGNOSIS — I10 Essential (primary) hypertension: Secondary | ICD-10-CM | POA: Diagnosis not present

## 2021-12-30 DIAGNOSIS — E785 Hyperlipidemia, unspecified: Secondary | ICD-10-CM | POA: Diagnosis not present

## 2021-12-30 DIAGNOSIS — Z79899 Other long term (current) drug therapy: Secondary | ICD-10-CM | POA: Insufficient documentation

## 2021-12-30 LAB — BASIC METABOLIC PANEL
Anion gap: 9 (ref 5–15)
BUN: 9 mg/dL (ref 8–23)
CO2: 29 mmol/L (ref 22–32)
Calcium: 9.8 mg/dL (ref 8.9–10.3)
Chloride: 104 mmol/L (ref 98–111)
Creatinine, Ser: 0.91 mg/dL (ref 0.44–1.00)
GFR, Estimated: >60 mL/min (ref >60)
Glucose, Bld: 107 mg/dL — ABNORMAL HIGH (ref 70–99)
Potassium: 4.8 mmol/L (ref 3.5–5.1)
Sodium: 142 mmol/L (ref 135–145)

## 2021-12-30 LAB — MAGNESIUM: Magnesium: 1.9 mg/dL (ref 1.7–2.4)

## 2021-12-30 NOTE — Progress Notes (Signed)
Primary Care Physician: [name] Norlander, DO Referring Physician: Dr. Radford Pax Cardiologist: Dr. Percival Spanish EP: Dr. [name] Hearing is a 76 y.o. female with a h/o afib, HTN, hyperlipidemia and TIA. She is in the afib clinic for f/u of Tikosyn use. She feels well with NSR today. She did have a mechanical fall a couple of weeks ago and was placed on metoprolol 25 mg bid for afib secondary to the fall. . She did convert in a couple of days and then stopped BB with issues with hypotension and bradycardia. Now however, her BP has been running higher, around 833-825 systolic.  Continues on xarelto 20 mg daily with a CHA2DS2VASc of at least 5, without any signs of bleeding.   F/u with afib clinic 06/19/21. She is staying in [place] with Tikosyn. She has had an lumpectomy and radiation. Is finished with treatment now and is on daily. Arimidex. She is pending a colonoscopy and has received instruction re holding her [place]. Reminded to take Tikosyn with a sip of water.    F/u in afib clinic, 07/22/21,  after a hemo peritoneal bleed around her spleen following a colonoscopy. She had minimal cbc drop and was observed x 2 days in the hospital. It did resolve without any intervention. She was switched form xarelto to eliquis on d/c.   F/u in afib clinic, 12/30/21 for ongoing  Tikosyn use. She is continuing  in [place]. No issues with anticoagulation. States that she has has had some recent stomach upset with diarrhea. Will check labs today.   Today, she denies symptoms of palpitations, chest pain, shortness of breath, orthopnea, PND, lower extremity edema, dizziness, presyncope, syncope, or neurologic sequela. The patient is tolerating medications without difficulties and is otherwise without complaint today.   Past Medical History:  Diagnosis Date   Anxiety    Atrial fibrillation ([place])    Cancer ([place]) 11/2020   left breast IMC   GERD (gastroesophageal reflux disease)    HTN (hypertension)    Hyperlipidemia     Personal history of radiation therapy    TIA (transient ischemic attack)    Past Surgical History:  Procedure Laterality Date   BOTOX INJECTION N/A 12/22/2018   Procedure: BOTOX INJECTION INTO ANAL SPHINCTER;  Surgeon: Ileana Roup, MD;  Location: [facility];  Service: General;  Laterality: N/A;   BREAST BIOPSY Left 11/26/2020   BREAST LUMPECTOMY WITH RADIOACTIVE SEED AND SENTINEL LYMPH NODE BIOPSY Left 01/07/2021   Procedure: LEFT BREAST LUMPECTOMY WITH RADIOACTIVE SEED AND LEFT AXILLARY SENTINEL LYMPH NODE BIOPSY WITH BLUE DYE INJECTION;  Surgeon: Donnie Mesa, MD;  Location: [place];  Service: General;  Laterality: Left;  LMA WITH PECTORAL BLOCK, BLUE DYE INJECTION   CARDIOVERSION N/A 01/04/2018   Procedure: CARDIOVERSION;  Surgeon: Dorothy Spark, MD;  Location: [place];  Service: Cardiovascular;  Laterality: N/A;   CARDIOVERSION N/A 09/20/2018   Procedure: CARDIOVERSION;  Surgeon: Sueanne Margarita, MD;  Location[facility];  Service: Cardiovascular;  Laterality: N/A;   CARDIOVERSION N/A 10/27/2018   Procedure: CARDIOVERSION;  Surgeon: Buford Dresser, MD;  Location: [place];  Service: Cardiovascular;  Laterality: N/A;   KNEE SURGERY Bilateral    Torn miniscus   RECTAL EXAM UNDER ANESTHESIA N/A 12/22/2018   Procedure: ANORECTAL  EXAM UNDER ANESTHESIA;  Surgeon: Ileana Roup, MD;  Location: [facility];  Service: General;  Laterality: N/A;   [place]  Current Outpatient Medications  Medication Sig Dispense Refill   acetaminophen (TYLENOL) 500 MG tablet Take 1,000 mg by mouth every 6 (six) hours as needed for moderate pain or headache.     amLODipine (NORVASC) 5 MG tablet Take 1.5 tablets (7.5 mg total) by mouth daily. 135 tablet 3   anastrozole (ARIMIDEX) 1 MG tablet Take 1 tablet (1 mg total) by mouth daily. 90 tablet 3   Calcium Carbonate-Vit D-Min (CALCIUM 1200 PO) Take 1,200 mg  by mouth every morning.     Cholecalciferol (VITAMIN D3) 5000 units CAPS Take 5,000 Units by mouth daily with supper.     dofetilide (TIKOSYN) 500 MCG capsule TAKE 1 CAPSULE BY MOUTH 2 TIMES DAILY. 180 capsule 2   DULoxetine (CYMBALTA) 30 MG capsule Take 1 capsule (30 mg total) by mouth daily. 90 capsule 3   ELIQUIS 5 MG TABS tablet TAKE 1 TABLET BY MOUTH TWICE A DAY 60 tablet 11   ezetimibe (ZETIA) 10 MG tablet Take 1 tablet (10 mg total) by mouth daily. 90 tablet 3   famotidine (PEPCID) 20 MG tablet Take 1 tablet (20 mg total) by mouth 2 (two) times daily. 180 tablet 3   furosemide (LASIX) 20 MG tablet Take 1 tablet (20 mg total) by mouth daily. 90 tablet 3   olmesartan (BENICAR) 5 MG tablet Take 1 tablet (5 mg total) by mouth daily. 90 tablet 3   potassium chloride SA (KLOR-CON M) 20 MEQ tablet Take 1 tablet (20 mEq total) by mouth 2 (two) times daily. 180 tablet 3   No current facility-administered medications for this encounter.    Allergies  Allergen Reactions   Augmentin [Amoxicillin-Pot Clavulanate] Other (See Comments)    c-diff Has patient had a PCN reaction causing immediate rash, facial/tongue/throat swelling, SOB or lightheadedness with hypotension: No Has patient had a PCN reaction causing severe rash involving mucus membranes or skin necrosis: No Has patient had a PCN reaction that required hospitalization: No Has patient had a PCN reaction occurring within the last 10 years: Yes If all of the above answers are "NO", then may proceed with Cephalosporin use.    Codeine Palpitations and Rash   Prednisone Other (See Comments)    Jittery, red in the face    Social History   Socioeconomic History   Marital status: Widowed    Spouse name: Not on file   Number of children: 2   Years of education: 87   Highest education level: Not on file  Occupational History   Occupation: Retired    Comment: Personnel officer  Tobacco Use   Smoking status: Never   Smokeless tobacco:  Never  Scientific laboratory technician Use: Never used  Substance and Sexual Activity   Alcohol use: No    Alcohol/week: 0.0 standard drinks   Drug use: No   Sexual activity: Not Currently    Birth control/protection: Post-menopausal  Other Topics Concern   Not on file  Social History Narrative   Lives alone - children in [place] -    Caffeine use: Soda/tea daily   Good church support group   Social Determinants of Health   Financial Resource Strain: Low Risk    Difficulty of Paying Living Expenses: Not hard at all  Food Insecurity: No Food Insecurity   Worried About Charity fundraiser in the Last Year: Never true   [place] in the Last Year: Never true  Transportation Needs: No Transportation Needs   Lack  of Transportation (Medical): No   Lack of Transportation (Non-Medical): No  Physical Activity: Inactive   Days of Exercise per Week: 0 days   Minutes of Exercise per Session: 0 min  Stress: Stress Concern Present   Feeling of Stress : To some extent  Social Connections: Moderately Integrated   Frequency of Communication with Friends and Family: More than three times a week   Frequency of Social Gatherings with Friends and Family: More than three times a week   Attends Religious Services: More than 4 times per year   Active Member of [employer] or Organizations: Yes   Attends Archivist Meetings: More than 4 times per year   Marital Status: Widowed  Human resources officer Violence: Not At Risk   Fear of Current or Ex-Partner: No   Emotionally Abused: No   Physically Abused: No   Sexually Abused: No    Family History  Problem Relation Age of Onset   Hypertension Mother    Hyperlipidemia Mother    Neurologic Disorder Mother 10       GB   Stroke Son    Stroke Maternal Uncle    Stroke Grandchild    Prostate cancer Brother     ROS- All systems are reviewed and negative except as per the HPI above  Physical Exam: Vitals:   12/30/21 1324  BP: (!)  164/70  Pulse: 64  Weight: 126.6 kg  Height: 5\' 6"  (1.676 m)   Wt Readings from Last 3 Encounters:  12/30/21 126.6 kg  12/02/21 128.8 kg  11/21/21 128.8 kg    Labs: Lab Results  Component Value Date   NA 143 12/02/2021   K 4.6 12/02/2021   CL 106 12/02/2021   CO2 24 12/02/2021   GLUCOSE 105 (H) 12/02/2021   BUN 15 12/02/2021   CREATININE 0.78 12/02/2021   CALCIUM 9.3 12/02/2021   PHOS 4.1 06/30/2021   MG 2.1 07/01/2021   Lab Results  Component Value Date   INR 1.7 (H) 06/29/2021   Lab Results  Component Value Date   CHOL 188 12/02/2021   HDL 59 12/02/2021   LDLCALC 105 (H) 12/02/2021   TRIG 134 12/02/2021     GEN- The patient is well appearing, alert and oriented x 3 today.   Head- normocephalic, atraumatic Eyes-  Sclera clear, conjunctiva pink Ears- hearing intact Oropharynx- clear Neck- supple, no JVP Lymph- no cervical lymphadenopathy Lungs- Clear to ausculation bilaterally, normal work of breathing Heart- regular rate and rhythm, no murmurs, rubs or gallops, PMI not laterally displaced GI- soft, NT, ND, + BS Extremities- no clubbing, cyanosis, trace LLE MS- no significant deformity or atrophy Skin- no rash or lesion Psych- euthymic mood, full affect Neuro- strength and sensation are intact  EKG- SR at 64 bpm, with first degree AV block.  PR int 234 ms, qrs int 68 ms, qtc 414 ms    - Left ventricle: The cavity size was normal. There was mild   concentric hypertrophy. Systolic function was normal. The   estimated ejection fraction was in the range of 60% to 65%. Wall   motion was normal; there were no regional wall motion   abnormalities. - Aortic valve: Transvalvular velocity was within the normal range.   There was no stenosis. There was no regurgitation. - Aorta: Ascending aortic diameter: 38 mm (S). - Ascending aorta: The ascending aorta was mildly dilated. - Mitral valve: Mildly calcified annulus. Transvalvular velocity   was within the normal  range. There was no  evidence for stenosis.   There was mild regurgitation. - Left atrium: The atrium was mildly dilated.LAD 57 mm, volume 78 ml - Right ventricle: The cavity size was normal. Wall thickness was   normal. Systolic function was normal. - Atrial septum: No defect or patent foramen ovale was identified. - Tricuspid valve: There was mild-moderate regurgitation. - Pulmonic valve: There was moderate regurgitation. - Pulmonary arteries: Systolic pressure was severely increased. PA   peak pressure: 69 mm Hg (S).     Assessment and Plan: 1. Persistent afib Maintaining  SR on Tikosyn 500 mcg bid qtc stable Continue eliquis 5 mg bid for CHA2DS2VASc of at least 5 Bmet/mag   2. HTN Stable at home, elevated here today  Amlodipine  7.5 mg daily   F/u with afib clinic in 6 months   [name] Collier, [facility] [address] [place], [place] [address] [phone]

## 2022-01-04 ENCOUNTER — Other Ambulatory Visit: Payer: Self-pay | Admitting: Family Medicine

## 2022-01-04 DIAGNOSIS — L219 Seborrheic dermatitis, unspecified: Secondary | ICD-10-CM

## 2022-01-06 DIAGNOSIS — G459 Transient cerebral ischemic attack, unspecified: Secondary | ICD-10-CM

## 2022-01-06 DIAGNOSIS — I10 Essential (primary) hypertension: Secondary | ICD-10-CM

## 2022-01-06 DIAGNOSIS — I480 Paroxysmal atrial fibrillation: Secondary | ICD-10-CM

## 2022-01-06 DIAGNOSIS — F320 Major depressive disorder, single episode, mild: Secondary | ICD-10-CM

## 2022-01-08 ENCOUNTER — Other Ambulatory Visit (HOSPITAL_COMMUNITY): Payer: Self-pay | Admitting: *Deleted

## 2022-01-08 MED ORDER — DOFETILIDE 500 MCG PO CAPS: 500.0000 ug | ORAL_CAPSULE | Freq: Two times a day (BID) | ORAL | 2 refills | Status: DC

## 2022-01-08 MED ORDER — APIXABAN 5 MG PO TABS
5.0000 mg | ORAL_TABLET | Freq: Two times a day (BID) | ORAL | 2 refills | Status: DC
Start: 2022-01-08 — End: 2022-09-29

## 2022-01-09 ENCOUNTER — Ambulatory Visit (INDEPENDENT_AMBULATORY_CARE_PROVIDER_SITE_OTHER): Payer: PPO | Admitting: Pharmacist

## 2022-01-09 DIAGNOSIS — E785 Hyperlipidemia, unspecified: Secondary | ICD-10-CM

## 2022-01-09 DIAGNOSIS — I10 Essential (primary) hypertension: Secondary | ICD-10-CM

## 2022-01-09 NOTE — Progress Notes (Signed)
Chronic Care Management Pharmacy Note  01/09/2022 Name:  [name] Collier MRN:  [id number] DOB:  [birth year]  Summary: AFIB, HTN  Recommendations/Changes made from today's visit: NO CHANGES MADE Assisting patient with medication assistance programs Stable on current cardiac regimen and follows with cardiology Eliquis samples given to patient today  Plan: f/u in 3 months  Subjective: [name] Collier is an 76 y.o. year old female who is a primary patient of [name], DO.  The CCM team was consulted for assistance with disease management and care coordination needs.    Engaged with patient by telephone for initial visit in response to provider referral for pharmacy case management and/or care coordination services.   Consent to Services:  The patient was given information about Chronic Care Management services, agreed to services, and gave verbal consent prior to initiation of services.  Please see initial visit note for detailed documentation.   Patient Care Team: [name], DO as PCP - General (Family Medicine) Minus Breeding, MD as PCP - Cardiology (Cardiology) Thompson Grayer, MD as PCP - Electrophysiology (Cardiology) [name], MD as Consulting Physician (Hematology and Oncology) Ronald Lobo, MD as Consulting Physician (Gastroenterology) Donnie Mesa, MD as Consulting Physician (General Surgery) [facility] [name], MD as Attending Physician (Radiation Oncology) Lavera Guise, [facility] as [place] [facility] (Pharmacist) Shea Evans, Norva Riffle, LCSW as [place] Management (Licensed Clinical Social Worker)  Objective:  Lab Results  Component Value Date   CREATININE 0.91 12/30/2021   CREATININE 0.78 12/02/2021   CREATININE 0.73 07/04/2021    Lab Results  Component Value Date   HGBA1C 5.3 12/02/2021   Last diabetic Eye exam: No results found for: HMDIABEYEEXA  Last diabetic Foot  exam: No results found for: HMDIABFOOTEX      Component Value Date/Time   CHOL 188 12/02/2021 0943   TRIG 134 12/02/2021 0943   HDL 59 12/02/2021 0943   CHOLHDL 3.2 12/02/2021 0943   CHOLHDL 4.6 02/27/2016 0519   VLDL 19 02/27/2016 0519   LDLCALC 105 (H) 12/02/2021 0943    Hepatic Function Latest Ref Rng & Units 12/02/2021 06/30/2021 06/29/2021  Total Protein 6.0 - 8.5 g/dL 7.0 6.7 6.8  Albumin 3.7 - 4.7 g/dL 4.0 3.2(L) 3.9  AST 0 - 40 IU/L 22 16 19   ALT 0 - 32 IU/L 19 15 13   Alk Phosphatase 44 - 121 IU/L 84 70 72  Total Bilirubin 0.0 - 1.2 mg/dL 0.8 1.8(H) 1.5(H)  Bilirubin, Direct 0.0 - 0.2 mg/dL - - 0.3(H)    Lab Results  Component Value Date/Time   TSH 5.260 (H) 12/02/2021 09:43 AM   TSH 4.353 06/30/2021 01:09 AM   TSH 4.400 05/30/2021 09:30 AM    CBC Latest Ref Rng & Units 12/02/2021 07/04/2021 07/01/2021  WBC 3.4 - 10.8 x10E3/uL 5.1 5.1 5.3  Hemoglobin 11.1 - 15.9 g/dL 12.9 12.4 12.0  Hematocrit 34.0 - 46.6 % 39.5 37.5 37.0  Platelets 150 - 450 x10E3/uL 254 293 235    No results found for: VD25OH  Clinical ASCVD: Yes  The 10-year ASCVD risk score (Arnett DK, et al., 2019) is: 32.2%   Values used to calculate the score:     Age: 35 years     Sex: Female     Is Non-Hispanic African American: No     Diabetic: No     Tobacco smoker: No     Systolic Blood Pressure: 381 mmHg  Is BP treated: Yes     HDL Cholesterol: 59 mg/dL     Total Cholesterol: 188 mg/dL    Other: (CHADS2VASc if Afib, PHQ9 if depression, MMRC or CAT for COPD, ACT, DEXA)  Social History   Tobacco Use  Smoking Status Never  Smokeless Tobacco Never   BP Readings from Last 3 Encounters:  12/30/21 (!) 164/70  12/02/21 (!) 145/79  11/21/21 (!) 174/68   Pulse Readings from Last 3 Encounters:  12/30/21 64  12/02/21 84  11/21/21 76   Wt Readings from Last 3 Encounters:  12/30/21 279 lb (126.6 kg)  12/02/21 284 lb (128.8 kg)  11/21/21 284 lb (128.8 kg)    Assessment: Review of  patient past medical history, allergies, medications, health status, including review of consultants reports, laboratory and other test data, was performed as part of comprehensive evaluation and provision of chronic care management services.   SDOH:  (Social Determinants of Health) assessments and interventions performed:    CCM Care Plan  Allergies  Allergen Reactions   Augmentin [Amoxicillin-Pot Clavulanate] Other (See Comments)    c-diff Has patient had a PCN reaction causing immediate rash, facial/tongue/throat swelling, SOB or lightheadedness with hypotension: No Has patient had a PCN reaction causing severe rash involving mucus membranes or skin necrosis: No Has patient had a PCN reaction that required hospitalization: No Has patient had a PCN reaction occurring within the last 10 years: Yes If all of the above answers are "NO", then may proceed with Cephalosporin use.    Codeine Palpitations and Rash   Prednisone Other (See Comments)    Jittery, red in the face    Medications Reviewed Today     Reviewed by Lavera Guise, [facility] (Pharmacist) on 01/09/22 at [place] List Status: <None>   Medication Order Taking? Sig Documenting Provider Last Dose Status Informant  acetaminophen (TYLENOL) 500 MG tablet [id number] No Take 1,000 mg by mouth every 6 (six) hours as needed for moderate pain or headache. [provider] Taking Active Self  amLODipine (NORVASC) 5 MG tablet [id number] No Take 1.5 tablets (7.5 mg total) by mouth daily. Ronnie Doss M, DO Taking Active   anastrozole (ARIMIDEX) 1 MG tablet [id number] No Take 1 tablet (1 mg total) by mouth daily. [name], MD Taking Active Self  apixaban (ELIQUIS) 5 MG TABS tablet [id number]  Take 1 tablet (5 mg total) by mouth 2 (two) times daily. Sherran Needs, NP  Active   Calcium Carbonate-Vit D-Min (CALCIUM 1200 PO) [id number] No Take 1,200 mg by mouth every morning. [provider] Taking Active Self   Cholecalciferol (VITAMIN D3) 5000 units CAPS [id number] No Take 5,000 Units by mouth daily with supper. [provider] Taking Active Self  dofetilide (TIKOSYN) 500 MCG capsule [id number]  Take 1 capsule (500 mcg total) by mouth 2 (two) times daily. Sherran Needs, NP  Active   DULoxetine (CYMBALTA) 30 MG capsule [id number] No Take 1 capsule (30 mg total) by mouth daily. Ronnie Doss M, DO Taking Active   ezetimibe (ZETIA) 10 MG tablet [id number] No Take 1 tablet (10 mg total) by mouth daily. Ronnie Doss M, DO Taking Active   famotidine (PEPCID) 20 MG tablet [id number] No Take 1 tablet (20 mg total) by mouth 2 (two) times daily. Ronnie Doss M, DO Taking Active   furosemide (LASIX) 20 MG tablet [id number] No Take 1 tablet (20 mg total) by mouth daily. Ronnie Doss M, DO Taking Active   olmesartan (BENICAR) 5 MG  tablet [id number] No Take 1 tablet (5 mg total) by mouth daily. Ronnie Doss M, DO Taking Active   potassium chloride SA (KLOR-CON M) 20 MEQ tablet [id number] No Take 1 tablet (20 mEq total) by mouth 2 (two) times daily. [name], DO Taking Active             Patient Active Problem List   Diagnosis Date Noted   Encounter for support and coordination of transition of care 07/04/2021   Spleen injury 06/29/2021   Carcinoma of upper-inner quadrant of left breast in female, estrogen receptor positive ([place]) 12/20/2020   Trigger thumb of left hand 06/28/2019   Depression, major, single episode, mild (HCC) 03/14/2019   Generalized anxiety disorder 03/14/2019   PAF (paroxysmal atrial fibrillation) ([place])    Osteoarthritis of knee 04/20/2018   Post-menopausal 02/22/2018   Snoring 01/12/2018   Hypokalemia 01/12/2018   Encounter for cardioversion procedure    Persistent atrial fibrillation 11/11/2017   SOB (shortness of breath) 11/11/2017   Other fatigue 11/11/2017   Hyperlipemia 04/16/2017   Weakness of left hand 05/11/2016   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   Essential hypertension    Normochromic normocytic anemia    TIA (transient ischemic attack) 02/26/2016    Immunization History  Administered Date(s) Administered   Moderna Sars-Covid-2 Vaccination 02/29/2020, 03/28/2020, 12/20/2020   Pneumococcal Conjugate-13 12/30/2016   Pneumococcal Polysaccharide-23 02/08/2018    Conditions to be addressed/monitored: Atrial Fibrillation and HTN  Care Plan : PHARMD MEDICATION MANAGEMENT  Updates made by Lavera Guise, RPH since 01/14/2022 12:00 AM     Problem: DISEASE PROGRESSION PREVENTION      Long-Range Goal: AFIB, HTN PHARMD GOAL   Note:   Current Barriers:  Unable to independently afford treatment regimen  Pharmacist Clinical Goal(s):  patient will verbalize ability to afford treatment regimen through collaboration with PharmD and provider.   Interventions: 1:1 collaboration with [name], DO regarding development and update of comprehensive plan of care as evidenced by provider attestation and co-signature Inter-disciplinary care team collaboration (see longitudinal plan of care) Comprehensive medication review performed; medication list updated in electronic medical record  Atrial Fibrillation/HTN:--follows with cardiology (we are assisting with medication assistance) Controlled; current rate/rhythm controlTIKOSYN; anticoagulant treatment: ELIQUIS CHADS2VASc score: at least  5 with h/o TIA Of note: was on xarelto in 2022 & had splenic bleed Home blood pressure, heart rate readings:  Assessed patient finances. WILL APPLY FOR ASSISTANCE FOR ELIQUIS ONCE PATIENT HAS MET 3% OUT OF POCKET SPEND;  LOOKING IN TO ASSISTANCE FOR TIKOSYN  Patient Goals/Self-Care Activities patient will:  - take medications as prescribed as evidenced by patient report and record review check blood pressure DAILY, document, and provide at future appointments collaborate with provider on medication access solutions       Medication Assistance:  looking into  Patient's preferred pharmacy is:  [facility] ([place], [facility] [place] [address] [place] [address] Phone: [phone] Fax: [phone]  [id number] [place]Phone: [phone]: [phone] [facility] ) - [place] [address] [address]Phone: [phone]: [phone] [address] [address] [address]Phone: [phone]: [phone]  Follow Up:  Patient agrees to Care Plan and Follow-up.  Plan: Telephone follow up appointment with care management team member scheduled for:  3 months   Regina Eck, PharmD, BCPS Clinical Pharmacist, [place]  II Phone [phone]

## 2022-01-10 ENCOUNTER — Other Ambulatory Visit: Payer: Self-pay | Admitting: Hematology and Oncology

## 2022-01-14 NOTE — Patient Instructions (Signed)
Visit Information  Following are the goals we discussed today:  Current Barriers:  Unable to independently afford treatment regimen  Pharmacist Clinical Goal(s):  patient will verbalize ability to afford treatment regimen through collaboration with PharmD and provider.   Interventions: 1:1 collaboration with [name], DO regarding development and update of comprehensive plan of care as evidenced by provider attestation and co-signature Inter-disciplinary care team collaboration (see longitudinal plan of care) Comprehensive medication review performed; medication list updated in electronic medical record  Atrial Fibrillation/HTN:--follows with cardiology (we are assisting with medication assistance) Controlled; current rate/rhythm controlTIKOSYN; anticoagulant treatment: ELIQUIS CHADS2VASc score: at least  5 with h/o TIA Of note: was on xarelto in 2022 & had splenic bleed Home blood pressure, heart rate readings:  Assessed patient finances. WILL APPLY FOR ASSISTANCE FOR ELIQUIS ONCE PATIENT HAS MET 3% OUT OF POCKET SPEND;  LOOKING IN TO ASSISTANCE FOR TIKOSYN  Patient Goals/Self-Care Activities patient will:  - take medications as prescribed as evidenced by patient report and record review check blood pressure DAILY, document, and provide at future appointments collaborate with provider on medication access solutions   Plan: Telephone follow up appointment with care management team member scheduled for:  3 months  Signature Regina Eck, PharmD, BCPS Clinical Pharmacist, [place]  II Phone [phone]   Please call the care guide team at [phone] if you need to cancel or reschedule your appointment.   Patient verbalizes understanding of instructions and care plan provided today and agrees to view in [place]. Active MyChart status confirmed with patient.

## 2022-01-16 ENCOUNTER — Telehealth: Payer: Self-pay | Admitting: Pharmacist

## 2022-01-16 DIAGNOSIS — F411 Generalized anxiety disorder: Secondary | ICD-10-CM

## 2022-01-16 DIAGNOSIS — F320 Major depressive disorder, single episode, mild: Secondary | ICD-10-CM

## 2022-01-16 DIAGNOSIS — R0602 Shortness of breath: Secondary | ICD-10-CM

## 2022-01-16 DIAGNOSIS — E785 Hyperlipidemia, unspecified: Secondary | ICD-10-CM

## 2022-01-16 DIAGNOSIS — I10 Essential (primary) hypertension: Secondary | ICD-10-CM

## 2022-01-16 MED ORDER — FAMOTIDINE 20 MG PO TABS: 20.0000 mg | ORAL_TABLET | Freq: Two times a day (BID) | ORAL | 3 refills | Status: DC

## 2022-01-16 MED ORDER — DULOXETINE HCL 30 MG PO CPEP: 30.0000 mg | ORAL_CAPSULE | Freq: Every day | ORAL | 3 refills | Status: DC

## 2022-01-16 MED ORDER — EZETIMIBE 10 MG PO TABS: 10.0000 mg | ORAL_TABLET | Freq: Every day | ORAL | 3 refills | Status: DC

## 2022-01-16 MED ORDER — OLMESARTAN MEDOXOMIL 5 MG PO TABS: 5.0000 mg | ORAL_TABLET | Freq: Every day | ORAL | 3 refills | Status: DC

## 2022-01-16 MED ORDER — FUROSEMIDE 20 MG PO TABS: 20.0000 mg | ORAL_TABLET | Freq: Every day | ORAL | 3 refills | Status: DC

## 2022-01-16 NOTE — Telephone Encounter (Signed)
Patient requesting all maintenance medications be sent [facility] has take care of the eliquis/tikosyn Send refills to [facility] (cheaper for patient)

## 2022-02-02 ENCOUNTER — Other Ambulatory Visit: Payer: Self-pay | Admitting: Family Medicine

## 2022-02-02 DIAGNOSIS — N3281 Overactive bladder: Secondary | ICD-10-CM

## 2022-02-03 ENCOUNTER — Ambulatory Visit: Payer: Medicare Other

## 2022-02-03 DIAGNOSIS — F411 Generalized anxiety disorder: Secondary | ICD-10-CM

## 2022-02-03 DIAGNOSIS — I10 Essential (primary) hypertension: Secondary | ICD-10-CM | POA: Diagnosis not present

## 2022-02-03 DIAGNOSIS — E785 Hyperlipidemia, unspecified: Secondary | ICD-10-CM | POA: Diagnosis not present

## 2022-02-03 DIAGNOSIS — G459 Transient cerebral ischemic attack, unspecified: Secondary | ICD-10-CM

## 2022-02-03 DIAGNOSIS — F320 Major depressive disorder, single episode, mild: Secondary | ICD-10-CM

## 2022-02-03 DIAGNOSIS — I480 Paroxysmal atrial fibrillation: Secondary | ICD-10-CM | POA: Diagnosis not present

## 2022-02-03 NOTE — Patient Instructions (Addendum)
Visit Information  Patient Goals: Depressive Symptoms Identified. Manage Depression Symptoms. Manage Grief symptoms  Time Frame:  Short Term Goal Priority:  Medium Progress:  On Track  Start Date:  12/10/21 Expected End Date:  03/09/22  Follow Up Date:  03/30/22 at 2:00 PM  Depressive Symptoms Identified. Manage depression symptoms; Manage Grief symptoms  Patient Self Care Activities:  Attends all scheduled provider appointments Attends church or other social activities Performs ADL's independently  Patient Coping Strengths:  [place]  Patient Self Care Deficits:  Some walking challenges Some pain issues Grief an depression issues  Patient Goals:  - spend time or talk with others at least 2 to 3 times per week - practice relaxation or meditation daily - keep a calendar with appointment dates  Follow Up Plan: LCSW to call client on 03/30/22 at 2:00 PM to assess client needs   Norva Riffle.Kierra Jezewski MSW, [place] [facility] representative [facility] Care Management [phone]

## 2022-02-03 NOTE — Chronic Care Management (AMB) (Signed)
Chronic Care Management    Clinical Social Work Note  02/03/2022 Name: [name] Collier MRN: [id number] DOB: 1946/03/03  [name] Collier is a 75 y.o. year old female who is a primary care patient of [name], DO. The CCM team was consulted to assist the patient with chronic disease management and/or care coordination needs related to: [employer] .   Engaged with patient by telephone for follow up visit in response to provider referral for social work chronic care management and care coordination services.   Consent to Services:  The patient was given information about Chronic Care Management services, agreed to services, and gave verbal consent prior to initiation of services.  Please see initial visit note for detailed documentation.   Patient agreed to services and consent obtained.   Assessment: Review of patient past medical history, allergies, medications, and health status, including review of relevant consultants reports was performed today as part of a comprehensive evaluation and provision of chronic care management and care coordination services.     SDOH (Social Determinants of Health) assessments and interventions performed:  SDOH Interventions    Flowsheet Row Most Recent Value  SDOH Interventions   Physical Activity Interventions Other (Comments)  [walking challenges. She has pain in her knees. She fatigues sometimes when walking and has to take rest breaks]  Stress Interventions Provide Counseling  [client has some stress related to managing medical needs]  Depression Interventions/Treatment  Counseling, Medication        Advanced Directives Status: See Vynca application for related entries.  CCM Care Plan  Allergies  Allergen Reactions   Augmentin [Amoxicillin-Pot Clavulanate] Other (See Comments)    c-diff Has patient had a PCN reaction causing immediate rash, facial/tongue/throat swelling, SOB or lightheadedness with hypotension: No Has patient  had a PCN reaction causing severe rash involving mucus membranes or skin necrosis: No Has patient had a PCN reaction that required hospitalization: No Has patient had a PCN reaction occurring within the last 10 years: Yes If all of the above answers are "NO", then may proceed with Cephalosporin use.    Codeine Palpitations and Rash   Prednisone Other (See Comments)    Jittery, red in the face    Outpatient Encounter Medications as of 02/03/2022  Medication Sig   acetaminophen (TYLENOL) 500 MG tablet Take 1,000 mg by mouth every 6 (six) hours as needed for moderate pain or headache.   amLODipine (NORVASC) 5 MG tablet Take 1.5 tablets (7.5 mg total) by mouth daily.   anastrozole (ARIMIDEX) 1 MG tablet TAKE 1 TABLET BY MOUTH  DAILY   apixaban (ELIQUIS) 5 MG TABS tablet Take 1 tablet (5 mg total) by mouth 2 (two) times daily.   Calcium Carbonate-Vit D-Min (CALCIUM 1200 PO) Take 1,200 mg by mouth every morning.   Cholecalciferol (VITAMIN D3) 5000 units CAPS Take 5,000 Units by mouth daily with supper.   dofetilide (TIKOSYN) 500 MCG capsule Take 1 capsule (500 mcg total) by mouth 2 (two) times daily.   DULoxetine (CYMBALTA) 30 MG capsule Take 1 capsule (30 mg total) by mouth daily.   ezetimibe (ZETIA) 10 MG tablet Take 1 tablet (10 mg total) by mouth daily.   famotidine (PEPCID) 20 MG tablet Take 1 tablet (20 mg total) by mouth 2 (two) times daily.   furosemide (LASIX) 20 MG tablet Take 1 tablet (20 mg total) by mouth daily.   olmesartan (BENICAR) 5 MG tablet Take 1 tablet (5 mg total) by mouth daily.   potassium  chloride SA (KLOR-CON M) 20 MEQ tablet Take 1 tablet (20 mEq total) by mouth 2 (two) times daily.   No facility-administered encounter medications on file as of 02/03/2022.    Patient Active Problem List   Diagnosis Date Noted   Encounter for support and coordination of transition of care 07/04/2021   Spleen injury 06/29/2021   Carcinoma of upper-inner quadrant of left breast in  female, estrogen receptor positive ([place]) 12/20/2020   Trigger thumb of left hand 06/28/2019   Depression, major, single episode, mild (HCC) 03/14/2019   Generalized anxiety disorder 03/14/2019   PAF (paroxysmal atrial fibrillation) ([place])    Osteoarthritis of knee 04/20/2018   Post-menopausal 02/22/2018   Snoring 01/12/2018   Hypokalemia 01/12/2018   Encounter for cardioversion procedure    Persistent atrial fibrillation 11/11/2017   SOB (shortness of breath) 11/11/2017   Other fatigue 11/11/2017   Hyperlipemia 04/16/2017   Weakness of left hand 05/11/2016   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 Manage depression issues. Manage Anxiety issues   Start Date: 02/03/2022  Expected End Date: 04/28/2022  This Visit's Progress: On track  Recent Progress: On track  Priority: Medium  Note:   Current Barriers:  Some mobility issues Knee pain issues Depression issues; some anxiety issues Suicidal Ideation/Homicidal Ideation: No  Clinical Social Work Goal(s):  patient will work with SW monthly by telephone or in person to reduce or manage symptoms related to depression issues and related to anxiety issues Patient will communicate as needed in next 30 days with RNCM to discuss nursing needs of client Patient will communicate as needed in next 30 days with Dr. Lottie Dawson, Pharmacist at [facility] to discuss medication questions of client  Interventions: 1:1 collaboration with [name], DO regarding development and update of comprehensive plan of care as evidenced by provider attestation and  co-signature Discussed current client needs with [name] Discussed CCM services with client. LCSW talked with Caryssa about LCSW support, RNCM support and about Pharmacist support Discussed feelings of grief of client. She said she was feeling ok today related to grief issues. She said she has support from her friends. She has support from friends at church. She has family support. Reviewed medication procurement for client Reviewed family support with client. Rickelle reported that she has support from her 2 sons and from her 2 sisters. Encouraged Marium to call Dr. Lottie Dawson, Pharmacist at [facility] to discuss medication questions of client. Client did mention that she takes Eliquis and that buying her monthly Eliquis medication is expensive for her Provided counseling support for client   Patient Self Care Activities:  Attends all scheduled provider appointments Attends church or other social activities Performs ADL's independently  Patient Coping Strengths:  [place]  Patient Self Care Deficits:  Some walking challenges Some pain issues Grief an depression issues  Patient Goals:  - spend time or talk with others at least 2 to 3 times per week - practice relaxation or meditation daily - keep a calendar with appointment dates  Follow Up Plan: LCSW to call client on 03/30/22 at 2:00 PM  to assess client needs      Norva Riffle.Rosamond Andress MSW, [place] Holiday representative [facility] Care Management [phone]

## 2022-02-12 ENCOUNTER — Ambulatory Visit: Payer: PPO | Admitting: Pharmacist

## 2022-02-12 DIAGNOSIS — I4819 Other persistent atrial fibrillation: Secondary | ICD-10-CM

## 2022-02-18 DIAGNOSIS — L718 Other rosacea: Secondary | ICD-10-CM | POA: Diagnosis not present

## 2022-02-18 DIAGNOSIS — L570 Actinic keratosis: Secondary | ICD-10-CM | POA: Diagnosis not present

## 2022-02-18 DIAGNOSIS — X32XXXA Exposure to sunlight, initial encounter: Secondary | ICD-10-CM | POA: Diagnosis not present

## 2022-02-18 NOTE — Progress Notes (Signed)
?  Care Management  ? ?Follow Up Note ? ? ?02/12/2022 ?Name: TANICKA BISAILLON MRN: [id number] DOB: March 08, 1946 ? ? ?Referred by: [name], DO ?Reason for referral : AFIB ? ? ?An unsuccessful telephone outreach was attempted today. The patient was referred to the case management team for assistance with care management and care coordination.  ? ?Follow Up Plan: Telephone follow up appointment with care management team member scheduled for: 3 MONTHS ? ?SIGNATURE ?Regina Eck, PharmD, BCPS ?Clinical Pharmacist, [facility] ?[place]  [place] Phone [phone] ? ? ?

## 2022-03-04 ENCOUNTER — Other Ambulatory Visit: Payer: Self-pay | Admitting: Nurse Practitioner

## 2022-03-19 ENCOUNTER — Ambulatory Visit (INDEPENDENT_AMBULATORY_CARE_PROVIDER_SITE_OTHER): Payer: PPO | Admitting: Pharmacist

## 2022-03-19 DIAGNOSIS — I4819 Other persistent atrial fibrillation: Secondary | ICD-10-CM

## 2022-03-19 DIAGNOSIS — I10 Essential (primary) hypertension: Secondary | ICD-10-CM

## 2022-03-19 NOTE — Progress Notes (Signed)
? ? ?Chronic Care Management ?Pharmacy Note ? ?03/19/2022 ?Name:  [name] Collier MRN:  [id number] DOB:  11/24/1946 ? ?Summary: ?Atrial Fibrillation/HTN:--follows with cardiology (we are assisting with medication assistance) ?Controlled; current rate/rhythm controlTIKOSYN; anticoagulant treatment: ELIQUIS ?CHADS2VASc score: at least 5 with h/o TIA ?Of note: was on xarelto in 2022 & had splenic bleed ?Home blood pressure, heart rate readings: within normal limits; patient checks at home ?Assessed patient finances. WILL APPLY FOR ASSISTANCE FOR ELIQUIS ONCE PATIENT HAS MET 3% OUT OF POCKET SPEND--patient has met 3%-->instructed her to bring in documents for [place] to apply for BMS patient assistance  ? ?Subjective: ?[name] Collier is an 76 y.o. year old female who is a primary patient of [name], DO.  The CCM team was consulted for assistance with disease management and care coordination needs.   ? ?Engaged with patient by telephone for follow up visit in response to provider referral for pharmacy case management and/or care coordination services.  ? ?Consent to Services:  ?The patient was given information about Chronic Care Management services, agreed to services, and gave verbal consent prior to initiation of services.  Please see initial visit note for detailed documentation.  ? ?Patient Care Team: ?[name], DO as PCP - General (Family Medicine) ?Minus Breeding, MD as PCP - Cardiology (Cardiology) ?Thompson Grayer, MD as PCP - Electrophysiology (Cardiology) ?[name], MD as Consulting Physician (Hematology and Oncology) ?Ronald Lobo, MD as Consulting Physician (Gastroenterology) ?Donnie Mesa, MD as Consulting Physician (General Surgery) ?[employer] ?[name], MD as Attending Physician (Radiation Oncology) ?Lavera Guise, [facility] as Pharmacist (Family Medicine) ?Katha Cabal, LCSW as [place] Management (Licensed Psychologist, forensic) ? ? ?Objective: ? ?Lab Results  ?Component Value Date  ? CREATININE 0.91 12/30/2021  ? CREATININE 0.78 12/02/2021  ? CREATININE 0.73 07/04/2021  ? ? ?Lab Results  ?Component Value Date  ? HGBA1C 5.3 12/02/2021  ? ?Last diabetic Eye exam: No results found for: HMDIABEYEEXA  ?Last diabetic Foot exam: No results found for: HMDIABFOOTEX  ? ?   ?Component Value Date/Time  ? CHOL 188 12/02/2021 0943  ? TRIG 134 12/02/2021 0943  ? HDL 59 12/02/2021 0943  ? CHOLHDL 3.2 12/02/2021 0943  ? CHOLHDL 4.6 02/27/2016 0519  ? VLDL 19 02/27/2016 0519  ? [place] 105 (H) 12/02/2021 0943  ? ? ? ?  Latest Ref Rng & Units 12/02/2021  ?  9:43 AM 06/30/2021  ?  1:09 AM 06/29/2021  ? 11:23 AM  ?Hepatic Function  ?Total Protein 6.0 - 8.5 g/dL 7.0   6.7   6.8    ?Albumin 3.7 - 4.7 g/dL 4.0   3.2   3.9    ?AST 0 - 40 IU/L 22   16   19     ?ALT 0 - 32 IU/L 19   15   13     ?Alk Phosphatase 44 - 121 IU/L 84   70   72    ?Total Bilirubin 0.0 - 1.2 mg/dL 0.8   1.8   1.5    ?Bilirubin, Direct 0.0 - 0.2 mg/dL   0.3    ? ? ?Lab Results  ?Component Value Date/Time  ? TSH 5.260 (H) 12/02/2021 09:43 AM  ? TSH 4.353 06/30/2021 01:09 AM  ? TSH 4.400 05/30/2021 09:30 AM  ? ? ? ?  Latest Ref Rng & Units 12/02/2021  ?  9:43 AM 07/04/2021  ? 11:40 AM 07/01/2021  ?  7:08  AM  ?CBC  ?WBC 3.4 - 10.8 x10E3/uL 5.1   5.1   5.3    ?Hemoglobin 11.1 - 15.9 g/dL 12.9   12.4   12.0    ?Hematocrit 34.0 - 46.6 % 39.5   37.5   37.0    ?Platelets 150 - 450 x10E3/uL 254   293   235    ? ? ?No results found for: VD25OH ? ?Clinical ASCVD: No  ?The 10-year ASCVD risk score (Arnett DK, et al., 2019) is: 32.2% ?  Values used to calculate the score: ?    Age: 31 years ?    Sex: Female ?    Is Non-Hispanic African American: No ?    Diabetic: No ?    Tobacco smoker: No ?    Systolic Blood Pressure: 956 mmHg ?    Is BP treated: Yes ?    HDL Cholesterol: 59 mg/dL ?    Total Cholesterol: 188 mg/dL   ? ?Other: (CHADS2VASc if Afib, PHQ9 if depression, MMRC or CAT for COPD, ACT,  DEXA) ? ?Social History  ? ?Tobacco Use  ?Smoking Status Never  ?Smokeless Tobacco Never  ? ?BP Readings from Last 3 Encounters:  ?12/30/21 (!) 164/70  ?12/02/21 (!) 145/79  ?11/21/21 (!) 174/68  ? ?Pulse Readings from Last 3 Encounters:  ?12/30/21 64  ?12/02/21 84  ?11/21/21 76  ? ?Wt Readings from Last 3 Encounters:  ?12/30/21 279 lb (126.6 kg)  ?12/02/21 284 lb (128.8 kg)  ?11/21/21 284 lb (128.8 kg)  ? ? ?Assessment: Review of patient past medical history, allergies, medications, health status, including review of consultants reports, laboratory and other test data, was performed as part of comprehensive evaluation and provision of chronic care management services.  ? ?SDOH:  (Social Determinants of Health) assessments and interventions performed:  ? ? ?CCM Care Plan ? ?Allergies  ?Allergen Reactions  ? Augmentin [Amoxicillin-Pot Clavulanate] Other (See Comments)  ?  c-diff ?Has patient had a PCN reaction causing immediate rash, facial/tongue/throat swelling, SOB or lightheadedness with hypotension: No ?Has patient had a PCN reaction causing severe rash involving mucus membranes or skin necrosis: No ?Has patient had a PCN reaction that required hospitalization: No ?Has patient had a PCN reaction occurring within the last 10 years: Yes ?If all of the above answers are "NO", then may proceed with Cephalosporin use. ?  ? Codeine Palpitations and Rash  ? Prednisone Other (See Comments)  ?  Jittery, red in the face  ? ? ?Medications Reviewed Today   ? ? Reviewed by Lavera Guise, [facility] (Pharmacist) on 03/19/22 at 1423  Med List Status: <None>  ? ?Medication Order Taking? Sig Documenting Provider Last Dose Status Informant  ?acetaminophen (TYLENOL) 500 MG tablet [id number] No Take 1,000 mg by mouth every 6 (six) hours as needed for moderate pain or headache. [provider] Taking Active Self  ?amLODipine (NORVASC) 5 MG tablet [id number] No Take 1.5 tablets (7.5 mg total) by mouth daily. Ronnie Doss M, DO  Taking Active   ?anastrozole (ARIMIDEX) 1 MG tablet [id number]  TAKE 1 TABLET BY MOUTH  DAILY [name], MD  Active   ?apixaban (ELIQUIS) 5 MG TABS tablet [id number]  Take 1 tablet (5 mg total) by mouth 2 (two) times daily. Sherran Needs, NP  Active   ?Calcium Carbonate-Vit D-Min (CALCIUM 1200 PO) [id number] No Take 1,200 mg by mouth every morning. [provider] Taking Active Self  ?Cholecalciferol (VITAMIN D3) 5000 units CAPS [id number] No Take 5,000 Units  by mouth daily with supper. [provider] Taking Active Self  ?dofetilide (TIKOSYN) 500 MCG capsule [id number]  TAKE 1 CAPSULE BY MOUTH TWICE A DAY Sherran Needs, NP  Active   ?DULoxetine (CYMBALTA) 30 MG capsule [id number]  Take 1 capsule (30 mg total) by mouth daily. Ronnie Doss M, DO  Active   ?ezetimibe (ZETIA) 10 MG tablet [id number]  Take 1 tablet (10 mg total) by mouth daily. Ronnie Doss M, DO  Active   ?famotidine (PEPCID) 20 MG tablet [id number]  Take 1 tablet (20 mg total) by mouth 2 (two) times daily. Ronnie Doss M, DO  Active   ?furosemide (LASIX) 20 MG tablet [id number]  Take 1 tablet (20 mg total) by mouth daily. [name], DO  Active   ?olmesartan (BENICAR) 5 MG tablet [id number]  Take 1 tablet (5 mg total) by mouth daily. Ronnie Doss M, DO  Active   ?potassium chloride SA (KLOR-CON M) 20 MEQ tablet [id number] No Take 1 tablet (20 mEq total) by mouth 2 (two) times daily. [name], DO Taking Active   ? ?  ?  ? ?  ? ? ?Patient Active Problem List  ? Diagnosis Date Noted  ? Encounter for support and coordination of transition of care 07/04/2021  ? Spleen injury 06/29/2021  ? Carcinoma of upper-inner quadrant of left breast in female, estrogen receptor positive ([place]) 12/20/2020  ? Trigger thumb of left hand 06/28/2019  ? Depression, major, single episode, mild ([place]) 03/14/2019  ? Generalized anxiety disorder 03/14/2019  ? PAF (paroxysmal atrial fibrillation) ([place])   ? Osteoarthritis of  knee 04/20/2018  ? Post-menopausal 02/22/2018  ? Snoring 01/12/2018  ? Hypokalemia 01/12/2018  ? Encounter for cardioversion procedure   ? Persistent atrial fibrillation 11/11/2017  ? SOB (shortness of breath) 11/12/19

## 2022-03-19 NOTE — Patient Instructions (Signed)
Visit Information ? ?Following are the goals we discussed today:  ?Current Barriers:  ?Unable to independently afford treatment regimen ? ?Pharmacist Clinical Goal(s):  ?patient will verbalize ability to afford treatment regimen through collaboration with PharmD and provider.  ? ?Interventions: ?1:1 collaboration with [name], DO regarding development and update of comprehensive plan of care as evidenced by provider attestation and co-signature ?Inter-disciplinary care team collaboration (see longitudinal plan of care) ?Comprehensive medication review performed; medication list updated in electronic medical record ? ?Atrial Fibrillation/HTN:--follows with cardiology (we are assisting with medication assistance) ?Controlled; current rate/rhythm controlTIKOSYN; anticoagulant treatment: ELIQUIS ?CHADS2VASc score: at least 5 with h/o TIA ?Of note: was on xarelto in 2022 & had splenic bleed ?Home blood pressure, heart rate readings: within normal limits; patient checks at home ?Assessed patient finances. WILL APPLY FOR ASSISTANCE FOR ELIQUIS ONCE PATIENT HAS MET 3% OUT OF POCKET SPEND--patient has met 3%-->instructed her to bring in documents for [place] to apply for BMS patient assistance  ? ?Patient Goals/Self-Care Activities ?patient will:  ?- take medications as prescribed as evidenced by patient report and record review ?check blood pressure DAILY, document, and provide at future appointments ?collaborate with provider on medication access solutions ? ? ?Plan: The patient has been provided with contact information for the care management team and has been advised to call with any health related questions or concerns.  ? ?Signature ?Regina Eck, PharmD, BCPS ?Clinical Pharmacist, [facility] ?[place]  [place] Phone [phone] ? ? ?Please call the care guide team at [phone] if you need to cancel or reschedule your appointment.  ? ?The patient verbalized understanding of  instructions, educational materials, and care plan provided today and declined offer to receive copy of patient instructions, educational materials, and care plan.  ? ?

## 2022-03-30 ENCOUNTER — Telehealth: Payer: PPO

## 2022-03-31 ENCOUNTER — Encounter (HOSPITAL_COMMUNITY): Payer: Self-pay

## 2022-04-05 DIAGNOSIS — I10 Essential (primary) hypertension: Secondary | ICD-10-CM

## 2022-04-05 DIAGNOSIS — I4819 Other persistent atrial fibrillation: Secondary | ICD-10-CM

## 2022-04-10 ENCOUNTER — Ambulatory Visit (HOSPITAL_COMMUNITY)
Admission: RE | Admit: 2022-04-10 | Discharge: 2022-04-10 | Disposition: A | Discharge status: Home / Self Care | Payer: PPO | Source: Ambulatory Visit | Attending: Physician Assistant | Admitting: Physician Assistant

## 2022-04-10 ENCOUNTER — Other Ambulatory Visit (HOSPITAL_COMMUNITY): Payer: Self-pay | Admitting: Physician Assistant

## 2022-04-10 DIAGNOSIS — R52 Pain, unspecified: Secondary | ICD-10-CM

## 2022-04-10 DIAGNOSIS — M25572 Pain in left ankle and joints of left foot: Secondary | ICD-10-CM | POA: Diagnosis not present

## 2022-04-10 DIAGNOSIS — M79661 Pain in right lower leg: Secondary | ICD-10-CM | POA: Diagnosis not present

## 2022-04-10 DIAGNOSIS — M79604 Pain in right leg: Secondary | ICD-10-CM | POA: Diagnosis not present

## 2022-04-10 DIAGNOSIS — M79662 Pain in left lower leg: Secondary | ICD-10-CM | POA: Diagnosis not present

## 2022-04-10 DIAGNOSIS — L03116 Cellulitis of left lower limb: Secondary | ICD-10-CM | POA: Diagnosis not present

## 2022-04-10 DIAGNOSIS — M79605 Pain in left leg: Secondary | ICD-10-CM | POA: Diagnosis not present

## 2022-04-14 ENCOUNTER — Ambulatory Visit (INDEPENDENT_AMBULATORY_CARE_PROVIDER_SITE_OTHER): Payer: PPO | Admitting: Nurse Practitioner

## 2022-04-14 ENCOUNTER — Encounter: Payer: Self-pay | Admitting: Nurse Practitioner

## 2022-04-14 VITALS — BP 127/74 | HR 78 | Temp 98.9°F | Ht 65.0 in | Wt 281.0 lb

## 2022-04-14 DIAGNOSIS — L03116 Cellulitis of left lower limb: Secondary | ICD-10-CM

## 2022-04-14 MED ORDER — DOXYCYCLINE HYCLATE 100 MG PO TABS: 100.0000 mg | ORAL_TABLET | Freq: Two times a day (BID) | ORAL | 0 refills | Status: DC

## 2022-04-14 NOTE — Patient Instructions (Signed)
Cellulitis, Adult  Cellulitis is a skin infection. The infected area is often warm, red, swollen, and sore. It occurs most often in the arms and lower legs. It is very important to get treated for this condition. What are the causes? This condition is caused by bacteria. The bacteria enter through a break in the skin, such as a cut, burn, insect bite, open sore, or crack. What increases the risk? This condition is more likely to occur in people who: Have a weak body defense system (immune system). Have open cuts, burns, bites, or scrapes on the skin. Are older than 76 years of age. Have a blood sugar problem (diabetes). Have a long-lasting (chronic) liver disease (cirrhosis) or kidney disease. Are very overweight (obese). Have a skin problem, such as: Itchy rash (eczema). Slow movement of blood in the veins (venous stasis). Fluid buildup below the skin (edema). Have been treated with high-energy rays (radiation). Use IV drugs. What are the signs or symptoms? Symptoms of this condition include: Skin that is: Red. Streaking. Spotting. Swollen. Sore or painful when you touch it. Warm. A fever. Chills. Blisters. How is this diagnosed? This condition is diagnosed based on: Medical history. Physical exam. Blood tests. Imaging tests. How is this treated? Treatment for this condition may include: Medicines to treat infections or allergies. Home care, such as: Rest. Placing cold or warm cloths (compresses) on the skin. Hospital care, if the condition is very bad. Follow these instructions at home: Medicines Take over-the-counter and prescription medicines only as told by your doctor. If you were prescribed an antibiotic medicine, take it as told by your doctor. Do not stop taking it even if you start to feel better. General instructions  Drink enough fluid to keep your pee (urine) pale yellow. Do not touch or rub the infected area. Raise (elevate) the infected area above  the level of your heart while you are sitting or lying down. Place cold or warm cloths on the area as told by your doctor. Keep all follow-up visits as told by your doctor. This is important. Contact a doctor if: You have a fever. You do not start to get better after 1-2 days of treatment. Your bone or joint under the infected area starts to hurt after the skin has healed. Your infection comes back. This can happen in the same area or another area. You have a swollen bump in the area. You have new symptoms. You feel ill and have muscle aches and pains. Get help right away if: Your symptoms get worse. You feel very sleepy. You throw up (vomit) or have watery poop (diarrhea) for a long time. You see red streaks coming from the area. Your red area gets larger. Your red area turns dark in color. These symptoms may represent a serious problem that is an emergency. Do not wait to see if the symptoms will go away. Get medical help right away. Call your local emergency services (911 in the [place] Do not drive yourself to the hospital. Summary Cellulitis is a skin infection. The area is often warm, red, swollen, and sore. This condition is treated with medicines, rest, and cold and warm cloths. Take all medicines only as told by your doctor. Tell your doctor if symptoms do not start to get better after 1-2 days of treatment. This information is not intended to replace advice given to you by your health care provider. Make sure you discuss any questions you have with your health care provider. Document Revised: 09/04/2021 Document   Reviewed: 09/04/2021 Elsevier Patient Education  2023 [employer]  

## 2022-04-14 NOTE — Progress Notes (Signed)
? ?Acute Office Visit ? ?Subjective:  ? ?  ?Patient ID: [name] Collier, female    DOB: 11/18/1946, 76 y.o.   MRN: [id number] ? ?Chief Complaint  ?Patient presents with  ? infection on leg  ?  Left Leg - has swelling in lower leg and left foot , states she fell a couple of months ago and that's when she noticed it - has been to the ortho last week and they told her everything looked good no broken bones or fractures   ? ? ?HPI ?Patient is in today for Reviewed intake note. ?SUBJECTIVE: ?[name] Collier is a 76 y.o. female who presents with erythema and tenderness.  ?Location: Left lower leg ?Onset: acute  ?Duration: Years.  Patient was getting out of the nebulizer on the left ear. ?Last night for fourth and 5 doses repeated blood pressure all applicable health: Patient understands what else she is on the paperwork of diabetes writing on the paper telehealth ? ?days and symptoms are worsening  ?Associated symptoms: edema  ?Recent treatment: none ?Functional status affected: no ? ? ?Allergies: Augmentin [amoxicillin-pot clavulanate], Codeine, and Prednisone ?Patient Active Problem List  ? Diagnosis Date Noted  ? Encounter for support and coordination of transition of care 07/04/2021  ? Spleen injury 06/29/2021  ? Carcinoma of upper-inner quadrant of left breast in female, estrogen receptor positive ([place]) 12/20/2020  ? Trigger thumb of left hand 06/28/2019  ? Depression, major, single episode, mild ([place]) 03/14/2019  ? Generalized anxiety disorder 03/14/2019  ? PAF (paroxysmal atrial fibrillation) ([place])   ? Osteoarthritis of knee 04/20/2018  ? Post-menopausal 02/22/2018  ? Snoring 01/12/2018  ? Hypokalemia 01/12/2018  ? Encounter for cardioversion procedure   ? Persistent atrial fibrillation 11/11/2017  ? SOB (shortness of breath) 11/11/2017  ? Other fatigue 11/11/2017  ? Hyperlipemia 04/16/2017  ? Weakness of left hand 05/11/2016  ? Anxiety related tremor 05/11/2016  ? Essential hypertension   ? Normochromic normocytic anemia   ?  TIA (transient ischemic attack) 02/26/2016  ? ? ?OBJECTIVE: ?APPEARANCE: Alert, oriented, no acute distress  ?CARDIOVASCULAR: regular rate and rhythm, no murmurs  ?RESPIRATORY: clear to auscultation, no wheezes or rales, and unlabored breathing  ?LESION SIZE/LOCATION: cellulitis present left lower leg  ?LESION DESCRIPTION: erythema and swelling  ?ASSOCIATED SIGNS: edema  ?SYSTEMIC SYMPTOMS: None  ?PULSES: peripheral pulses symmetrical  ?CAPILLARY REFILL: Normal  ? ?Review of Systems  ?Constitutional: Negative.   ?HENT: Negative.    ?Respiratory: Negative.    ?Cardiovascular: Negative.   ?Genitourinary: Negative.   ?Skin:   ?     Redness/tenderness left lower leg  ?All other systems reviewed and are negative. ? ? ?   ?Objective:  ?  ?BP 127/74   Pulse 78   Temp 98.9 ?F (37.2 ?C)   Ht '5\' 5"'$  (1.651 m)   Wt 281 lb (127.5 kg)   SpO2 92%   BMI 46.76 kg/m?  ?BP Readings from Last 3 Encounters:  ?04/14/22 127/74  ?12/30/21 (!) 164/70  ?12/02/21 (!) 145/79  ? ?Wt Readings from Last 3 Encounters:  ?04/14/22 281 lb (127.5 kg)  ?12/30/21 279 lb (126.6 kg)  ?12/02/21 284 lb (128.8 kg)  ? ?  ? ?Physical Exam ?Vitals and nursing note reviewed.  ?Constitutional:   ?   Appearance: She is obese.  ?HENT:  ?   Head: Normocephalic.  ?   Nose: Nose normal.  ?Cardiovascular:  ?   Rate and Rhythm: Normal rate and regular rhythm.  ?   Pulses: Normal  pulses.  ?Pulmonary:  ?   Effort: Pulmonary effort is normal.  ?   Breath sounds: Normal breath sounds.  ?Musculoskeletal:  ?   Left lower leg: Swelling and tenderness present. Edema present.  ?Skin: ?   General: Skin is warm.  ?   Findings: Erythema present.  ? ?    ?   Comments: Cellulitis left lower leg.  ?Neurological:  ?   Mental Status: She is alert and oriented to person, place, and time.  ? ? ?No results found for any visits on 04/14/22. ? ? ?   ?Assessment & Plan:  ?Cellulitis left lower leg symptoms not well controlled in the past few days. ?Warmth and redness present.  Patient  unable to get Rocephin due to currently taking Tikosyn for A-fib.  Started patient on doxycycline 500 mg tablet twice daily.  Education provided to patient to separate administration time from heart medication and follow-up in 5 days.  With worsening erythema and fever patient may go to the emergency department. ? ?Patient verbalized understanding.  Printed handouts given. ? ?Follow-up with unresolved or worsening symptoms. ?Problem List Items Addressed This Visit   ?None ?Visit Diagnoses   ? ? Cellulitis of left leg    -  Primary  ? Relevant Medications  ? doxycycline (VIBRA-TABS) 100 MG tablet  ? ?  ? ? ?Meds ordered this encounter  ?Medications  ? doxycycline (VIBRA-TABS) 100 MG tablet  ?  Sig: Take 1 tablet (100 mg total) by mouth 2 (two) times daily.  ?  Dispense:  14 tablet  ?  Refill:  0  ?  Order Specific Question:   Supervising Provider  ?  AnswerClaretta Fraise [[id number]]  ? ? ?Return if symptoms worsen or fail to improve. ? ?[name] Lynn, NP ? ? ?

## 2022-04-22 ENCOUNTER — Encounter: Payer: Self-pay | Admitting: Family Medicine

## 2022-04-22 ENCOUNTER — Ambulatory Visit (INDEPENDENT_AMBULATORY_CARE_PROVIDER_SITE_OTHER): Payer: PPO | Admitting: Family Medicine

## 2022-04-22 ENCOUNTER — Telehealth: Payer: Self-pay | Admitting: Family Medicine

## 2022-04-22 VITALS — BP 135/65 | HR 65 | Temp 98.2°F | Ht 65.0 in | Wt 280.0 lb

## 2022-04-22 DIAGNOSIS — L03116 Cellulitis of left lower limb: Secondary | ICD-10-CM

## 2022-04-22 DIAGNOSIS — R7989 Other specified abnormal findings of blood chemistry: Secondary | ICD-10-CM

## 2022-04-22 DIAGNOSIS — E7800 Pure hypercholesterolemia, unspecified: Secondary | ICD-10-CM

## 2022-04-22 DIAGNOSIS — I480 Paroxysmal atrial fibrillation: Secondary | ICD-10-CM

## 2022-04-22 MED ORDER — DOXYCYCLINE HYCLATE 100 MG PO TABS: 100.0000 mg | ORAL_TABLET | Freq: Two times a day (BID) | ORAL | 0 refills | Status: AC

## 2022-04-22 NOTE — Telephone Encounter (Signed)
PT AWARE MY VM ?

## 2022-04-22 NOTE — Progress Notes (Signed)
?  ? ?Subjective:  ?Patient ID: [name] Collier, female    DOB: 1946-03-19, 76 y.o.   MRN: [id number] ? ?Patient Care Team: ?[name], DO as PCP - General (Family Medicine) ?Minus Breeding, MD as PCP - Cardiology (Cardiology) ?Thompson Grayer, MD as PCP - Electrophysiology (Cardiology) ?[name], MD as Consulting Physician (Hematology and Oncology) ?Ronald Lobo, MD as Consulting Physician (Gastroenterology) ?Donnie Mesa, MD as Consulting Physician (General Surgery) ?[employer] ?[name], MD as Attending Physician (Radiation Oncology) ?Lavera Guise, [facility] as Pharmacist (Family Medicine) ?Katha Cabal, LCSW as [place] Management (Licensed Holiday representative)  ? ?Chief Complaint:  Cellulitis ? ? ?HPI: ?[name] Collier is a 76 y.o. female presenting on 04/22/2022 for Cellulitis ? ? ?Pt presents today for cellulitis follow up. She was initially seen on 04/14/2022 and treated with 7 days of doxycycline. Pt reports symptoms have improved greatly but are still present. No fever, chills, weakness, fatigue, or syncope.  ? ? ?Relevant past medical, surgical, family, and social history reviewed and updated as indicated.  ?Allergies and medications reviewed and updated. Data reviewed: Chart in Epic. ? ? ?Past Medical History:  ?Diagnosis Date  ? Anxiety   ? Atrial fibrillation ([place])   ? Cancer ([place]) 11/2020  ? left breast IMC  ? GERD (gastroesophageal reflux disease)   ? HTN (hypertension)   ? Hyperlipidemia   ? Personal history of radiation therapy   ? TIA (transient ischemic attack)   ? ? ?Past Surgical History:  ?Procedure Laterality Date  ? BOTOX INJECTION N/A 12/22/2018  ? Procedure: BOTOX INJECTION INTO ANAL SPHINCTER;  Surgeon: Ileana Roup, MD;  Location: [facility];  Service: General;  Laterality: N/A;  ? BREAST BIOPSY Left 11/26/2020  ? BREAST LUMPECTOMY WITH RADIOACTIVE SEED AND SENTINEL LYMPH NODE BIOPSY Left 01/07/2021  ? Procedure: LEFT  BREAST LUMPECTOMY WITH RADIOACTIVE SEED AND LEFT AXILLARY SENTINEL LYMPH NODE BIOPSY WITH BLUE DYE INJECTION;  Surgeon: Donnie Mesa, MD;  Location: [place];  Service: General;  Laterality: Left;  LMA WITH PECTORAL BLOCK, BLUE DYE INJECTION  ? CARDIOVERSION N/A 01/04/2018  ? Procedure: CARDIOVERSION;  Surgeon: Dorothy Spark, MD;  Location: [facility] ENDOSCOPY;  Service: Cardiovascular;  Laterality: N/A;  ? CARDIOVERSION N/A 09/20/2018  ? Procedure: CARDIOVERSION;  Surgeon: Sueanne Margarita, MD;  Location: [facility] ENDOSCOPY;  Service: Cardiovascular;  Laterality: N/A;  ? CARDIOVERSION N/A 10/27/2018  ? Procedure: CARDIOVERSION;  Surgeon: Buford Dresser, MD;  Location: [facility] ENDOSCOPY;  Service: Cardiovascular;  Laterality: N/A;  ? KNEE SURGERY Bilateral   ? Torn miniscus  ? RECTAL EXAM UNDER ANESTHESIA N/A 12/22/2018  ? Procedure: ANORECTAL  EXAM UNDER ANESTHESIA;  Surgeon: Ileana Roup, MD;  Location: [facility]S;  Service: General;  Laterality: N/A;  ? RECTOCELE REPAIR    ? TONSILLECTOMY    ? VAGINAL HYSTERECTOMY    ? ? ?Social History  ? ?Socioeconomic History  ? Marital status: Widowed  ?  Spouse name: Not on file  ? Number of children: 2  ? Years of education: 8  ? Highest education level: Not on file  ?Occupational History  ? Occupation: Retired  ?  Comment: [place]  ?Tobacco Use  ? Smoking status: Never  ? Smokeless tobacco: Never  ?Vaping Use  ? Vaping Use: Never used  ?Substance and Sexual Activity  ? Alcohol use: No  ?  Alcohol/week: 0.0 standard drinks  ? Drug use: No  ? Sexual activity:  Not Currently  ?  Birth control/protection: Post-menopausal  ?Other Topics Concern  ? Not on file  ?Social History Narrative  ? Lives alone - children in [place] and [place] -   ? Caffeine use: Soda/tea daily  ? Good church support group  ? ?Social Determinants of Health  ? ?Financial Resource Strain: Low Risk   ? Difficulty of Paying Living Expenses: Not hard at all  ?Food Insecurity: No Food  Insecurity  ? Worried About Charity fundraiser in the Last Year: Never true  ? Ran Out of Food in the Last Year: Never true  ?Transportation Needs: No Transportation Needs  ? Lack of Transportation (Medical): No  ? Lack of Transportation (Non-Medical): No  ?Physical Activity: Inactive  ? Days of Exercise per Week: 0 days  ? Minutes of Exercise per Session: 0 min  ?Stress: Stress Concern Present  ? Feeling of Stress : To some extent  ?Social Connections: Moderately Integrated  ? Frequency of Communication with Friends and Family: More than three times a week  ? Frequency of Social Gatherings with Friends and Family: More than three times a week  ? Attends Religious Services: More than 4 times per year  ? Active Member of Clubs or Organizations: Yes  ? Attends Archivist Meetings: More than 4 times per year  ? Marital Status: Widowed  ?Intimate Partner Violence: Not At Risk  ? Fear of Current or Ex-Partner: No  ? Emotionally Abused: No  ? Physically Abused: No  ? Sexually Abused: No  ? ? ?Outpatient Encounter Medications as of 04/22/2022  ?Medication Sig  ? acetaminophen (TYLENOL) 500 MG tablet Take 1,000 mg by mouth every 6 (six) hours as needed for moderate pain or headache.  ? amLODipine (NORVASC) 5 MG tablet Take 1.5 tablets (7.5 mg total) by mouth daily.  ? anastrozole (ARIMIDEX) 1 MG tablet TAKE 1 TABLET BY MOUTH  DAILY  ? apixaban (ELIQUIS) 5 MG TABS tablet Take 1 tablet (5 mg total) by mouth 2 (two) times daily.  ? Calcium Carbonate-Vit D-Min (CALCIUM 1200 PO) Take 1,200 mg by mouth every morning.  ? Cholecalciferol (VITAMIN D3) 5000 units CAPS Take 5,000 Units by mouth daily with supper.  ? dofetilide (TIKOSYN) 500 MCG capsule TAKE 1 CAPSULE BY MOUTH TWICE A DAY  ? doxycycline (VIBRA-TABS) 100 MG tablet Take 1 tablet (100 mg total) by mouth 2 (two) times daily for 5 days. 1 po bid  ? DULoxetine (CYMBALTA) 30 MG capsule Take 1 capsule (30 mg total) by mouth daily.  ? ezetimibe (ZETIA) 10 MG tablet  Take 1 tablet (10 mg total) by mouth daily.  ? famotidine (PEPCID) 20 MG tablet Take 1 tablet (20 mg total) by mouth 2 (two) times daily.  ? furosemide (LASIX) 20 MG tablet Take 1 tablet (20 mg total) by mouth daily.  ? olmesartan (BENICAR) 5 MG tablet Take 1 tablet (5 mg total) by mouth daily.  ? potassium chloride SA (KLOR-CON M) 20 MEQ tablet Take 1 tablet (20 mEq total) by mouth 2 (two) times daily.  ? [DISCONTINUED] doxycycline (VIBRA-TABS) 100 MG tablet Take 1 tablet (100 mg total) by mouth 2 (two) times daily.  ? ?No facility-administered encounter medications on file as of 04/22/2022.  ? ? ?Allergies  ?Allergen Reactions  ? Augmentin [Amoxicillin-Pot Clavulanate] Other (See Comments)  ?  c-diff ?Has patient had a PCN reaction causing immediate rash, facial/tongue/throat swelling, SOB or lightheadedness with hypotension: No ?Has patient had a PCN reaction causing severe  rash involving mucus membranes or skin necrosis: No ?Has patient had a PCN reaction that required hospitalization: No ?Has patient had a PCN reaction occurring within the last 10 years: Yes ?If all of the above answers are "NO", then may proceed with Cephalosporin use. ?  ? Codeine Palpitations and Rash  ? Prednisone Other (See Comments)  ?  Jittery, red in the face  ? ? ?Review of Systems  ?Constitutional:  Negative for activity change, appetite change, chills, diaphoresis, fatigue, fever and unexpected weight change.  ?Eyes:  Negative for photophobia and visual disturbance.  ?Respiratory:  Negative for cough and shortness of breath.   ?Cardiovascular:  Positive for leg swelling. Negative for chest pain and palpitations.  ?Gastrointestinal:  Negative for abdominal pain, diarrhea, nausea and vomiting.  ?Genitourinary:  Negative for decreased urine volume and difficulty urinating.  ?Skin:  Positive for color change and rash.  ?Neurological:  Negative for dizziness and weakness.  ?Psychiatric/Behavioral:  Negative for confusion.   ?All other  systems reviewed and are negative. ? ?   ? ?Objective:  ?BP 135/65   Pulse 65   Temp 98.2 ?F (36.8 ?C)   Ht '5\' 5"'$  (1.651 m)   Wt 280 lb (127 kg)   SpO2 92%   BMI 46.59 kg/m?   ? ?Wt Readings from Last 3 Encou

## 2022-04-22 NOTE — Telephone Encounter (Signed)
Labs are in. Please come fasting ?

## 2022-04-24 ENCOUNTER — Other Ambulatory Visit: Payer: PPO

## 2022-04-24 DIAGNOSIS — R739 Hyperglycemia, unspecified: Secondary | ICD-10-CM | POA: Diagnosis not present

## 2022-04-24 DIAGNOSIS — I480 Paroxysmal atrial fibrillation: Secondary | ICD-10-CM

## 2022-04-24 DIAGNOSIS — R7989 Other specified abnormal findings of blood chemistry: Secondary | ICD-10-CM | POA: Diagnosis not present

## 2022-04-24 DIAGNOSIS — E7800 Pure hypercholesterolemia, unspecified: Secondary | ICD-10-CM | POA: Diagnosis not present

## 2022-04-24 LAB — BAYER DCA HB A1C WAIVED: HB A1C (BAYER DCA - WAIVED): 5.6 % (ref 4.8–5.6)

## 2022-04-25 LAB — LIPID PANEL
Chol/HDL Ratio: 3.1 ratio (ref 0.0–4.4)
Cholesterol, Total: 173 mg/dL (ref 100–199)
HDL: 56 mg/dL (ref >39)
LDL Chol Calc (NIH): 98 mg/dL (ref 0–99)
Triglycerides: 103 mg/dL (ref 0–149)
VLDL Cholesterol Cal: 19 mg/dL (ref 5–40)

## 2022-04-25 LAB — CMP14+EGFR
ALT: 20 IU/L (ref 0–32)
AST: 18 IU/L (ref 0–40)
Albumin/Globulin Ratio: 1.4 (ref 1.2–2.2)
Albumin: 3.9 g/dL (ref 3.7–4.7)
Alkaline Phosphatase: 93 IU/L (ref 44–121)
BUN/Creatinine Ratio: 19 (ref 12–28)
BUN: 14 mg/dL (ref 8–27)
Bilirubin Total: 0.7 mg/dL (ref 0.0–1.2)
CO2: 23 mmol/L (ref 20–29)
Calcium: 9.3 mg/dL (ref 8.7–10.3)
Chloride: 105 mmol/L (ref 96–106)
Creatinine, Ser: 0.74 mg/dL (ref 0.57–1.00)
Globulin, Total: 2.8 g/dL (ref 1.5–4.5)
Glucose: 113 mg/dL — ABNORMAL HIGH (ref 70–99)
Potassium: 4.1 mmol/L (ref 3.5–5.2)
Sodium: 142 mmol/L (ref 134–144)
Total Protein: 6.7 g/dL (ref 6.0–8.5)
eGFR: 84 mL/min/{1.73_m2} (ref >59)

## 2022-04-25 LAB — T4, FREE: Free T4: 1.31 ng/dL (ref 0.82–1.77)

## 2022-04-25 LAB — TSH: TSH: 4.27 u[IU]/mL (ref 0.450–4.500)

## 2022-04-28 ENCOUNTER — Ambulatory Visit: Payer: PPO

## 2022-04-28 ENCOUNTER — Ambulatory Visit (INDEPENDENT_AMBULATORY_CARE_PROVIDER_SITE_OTHER): Payer: PPO | Admitting: Family Medicine

## 2022-04-28 ENCOUNTER — Encounter: Payer: Self-pay | Admitting: Family Medicine

## 2022-04-28 VITALS — BP 130/67 | HR 70 | Temp 98.0°F | Ht 65.0 in | Wt 278.0 lb

## 2022-04-28 DIAGNOSIS — F411 Generalized anxiety disorder: Secondary | ICD-10-CM | POA: Diagnosis not present

## 2022-04-28 DIAGNOSIS — F320 Major depressive disorder, single episode, mild: Secondary | ICD-10-CM

## 2022-04-28 DIAGNOSIS — Z872 Personal history of diseases of the skin and subcutaneous tissue: Secondary | ICD-10-CM | POA: Diagnosis not present

## 2022-04-28 DIAGNOSIS — I480 Paroxysmal atrial fibrillation: Secondary | ICD-10-CM | POA: Diagnosis not present

## 2022-04-28 MED ORDER — AMLODIPINE BESYLATE 5 MG PO TABS
7.5000 mg | ORAL_TABLET | Freq: Every day | ORAL | 3 refills | Status: DC
Start: 2022-04-28 — End: 2023-02-17

## 2022-04-28 MED ORDER — DULOXETINE HCL 60 MG PO CPEP: 60.0000 mg | ORAL_CAPSULE | Freq: Every day | ORAL | 3 refills | Status: DC

## 2022-04-28 MED ORDER — POTASSIUM CHLORIDE CRYS ER 20 MEQ PO TBCR: 20.0000 meq | EXTENDED_RELEASE_TABLET | Freq: Two times a day (BID) | ORAL | 3 refills | Status: DC
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
## 2022-04-30 ENCOUNTER — Ambulatory Visit (INDEPENDENT_AMBULATORY_CARE_PROVIDER_SITE_OTHER): Payer: PPO

## 2022-04-30 VITALS — Wt 278.0 lb

## 2022-04-30 DIAGNOSIS — K219 Gastro-esophageal reflux disease without esophagitis: Secondary | ICD-10-CM | POA: Insufficient documentation

## 2022-04-30 DIAGNOSIS — Z0000 Encounter for general adult medical examination without abnormal findings: Secondary | ICD-10-CM | POA: Diagnosis not present

## 2022-04-30 DIAGNOSIS — Z800 Family history of malignant neoplasm of digestive organs: Secondary | ICD-10-CM | POA: Insufficient documentation

## 2022-04-30 DIAGNOSIS — D126 Benign neoplasm of colon, unspecified: Secondary | ICD-10-CM | POA: Insufficient documentation

## 2022-04-30 NOTE — Progress Notes (Signed)
Subjective:   [name] Collier is a 76 y.o. female who presents for Medicare Annual (Subsequent) preventive examination.  Virtual Visit via Telephone Note  I connected with  [name] Collier on 04/30/22 at  1:15 PM EDT by telephone and verified that I am speaking with the correct person using two identifiers.  Location: Patient: Home Provider: [facility] Persons participating in the virtual visit: patient/Nurse Health Advisor   I discussed the limitations, risks, security and privacy concerns of performing an evaluation and management service by telephone and the availability of in person appointments. The patient expressed understanding and agreed to proceed.  Interactive audio and video telecommunications were attempted between this nurse and patient, however failed, due to patient having technical difficulties OR patient did not have access to video capability.  We continued and completed visit with audio only.  Some vital signs may be absent or patient reported.   Candia Kingsbury E Ahley Bulls, LPN   Review of Systems     Cardiac Risk Factors include: advanced age (>22mn, >>37women);sedentary lifestyle;obesity (BMI >30kg/m2);hypertension;Other (see comment), Risk factor comments: hx of TIA, anemia, A.Fib     Objective:    Today's Vitals   04/30/22 1317  Weight: 278 lb (126.1 kg)   Body mass index is 46.26 kg/m.     04/30/2022    1:29 PM 06/29/2021   11:07 AM 04/25/2021   12:57 PM 02/11/2021    8:22 AM 01/07/2021    8:18 AM 01/01/2021    6:08 PM 12/20/2020    8:11 AM  Advanced Directives  Does Patient Have a Medical Advance Directive? Yes No Yes No Yes Yes Yes  Type of AParamedicof [place]East GaffneyLiving will  HMunizLiving will  HAstatulaLiving will [place]uckettLiving will HHilltop LakesLiving will  Does patient want to make changes to medical advance directive?     No - Patient declined No - Patient  declined No - Patient declined  Copy of HWinfredin Chart? No - copy requested  No - copy requested  No - copy requested  No - copy requested  Would patient like information on creating a medical advance directive?  No - Patient declined  No - Patient declined       Current Medications (verified) Outpatient Encounter Medications as of 04/30/2022  Medication Sig   acetaminophen (TYLENOL) 500 MG tablet Take 1,000 mg by mouth every 6 (six) hours as needed for moderate pain or headache.   amLODipine (NORVASC) 5 MG tablet Take 1.5 tablets (7.5 mg total) by mouth daily.   anastrozole (ARIMIDEX) 1 MG tablet TAKE 1 TABLET BY MOUTH  DAILY   apixaban (ELIQUIS) 5 MG TABS tablet Take 1 tablet (5 mg total) by mouth 2 (two) times daily.   Calcium Carbonate-Vit D-Min (CALCIUM 1200 PO) Take 1,200 mg by mouth every morning.   Cholecalciferol (VITAMIN D3) 5000 units CAPS Take 5,000 Units by mouth daily with supper.   dofetilide (TIKOSYN) 500 MCG capsule TAKE 1 CAPSULE BY MOUTH TWICE A DAY   DULoxetine (CYMBALTA) 60 MG capsule Take 1 capsule (60 mg total) by mouth daily. Note dose change   ezetimibe (ZETIA) 10 MG tablet Take 1 tablet (10 mg total) by mouth daily.   famotidine (PEPCID) 20 MG tablet Take 1 tablet (20 mg total) by mouth 2 (two) times daily.   furosemide (LASIX) 20 MG tablet Take 1 tablet (20 mg total) by mouth daily.  olmesartan (BENICAR) 5 MG tablet Take 1 tablet (5 mg total) by mouth daily.   potassium chloride SA (KLOR-CON M) 20 MEQ tablet Take 1 tablet (20 mEq total) by mouth 2 (two) times daily.   No facility-administered encounter medications on file as of 04/30/2022.    Allergies (verified) Augmentin [amoxicillin-pot clavulanate], Codeine, and Prednisone   History: Past Medical History:  Diagnosis Date   Anxiety    Atrial fibrillation ([place])    Cancer ([place]) 11/2020   left breast IMC   GERD (gastroesophageal reflux disease)    HTN (hypertension)     Hyperlipidemia    Personal history of radiation therapy    TIA (transient ischemic attack)    Past Surgical History:  Procedure Laterality Date   BOTOX INJECTION N/A 12/22/2018   Procedure: BOTOX INJECTION INTO ANAL SPHINCTER;  Surgeon: Ileana Roup, MD;  Location: [facility];  Service: General;  Laterality: N/A;   BREAST BIOPSY Left 11/26/2020   BREAST LUMPECTOMY WITH RADIOACTIVE SEED AND SENTINEL LYMPH NODE BIOPSY Left 01/07/2021   Procedure: LEFT BREAST LUMPECTOMY WITH RADIOACTIVE SEED AND LEFT AXILLARY SENTINEL LYMPH NODE BIOPSY WITH BLUE DYE INJECTION;  Surgeon: Donnie Mesa, MD;  Location: [facility];  Service: General;  Laterality: Left;  LMA WITH PECTORAL BLOCK, BLUE DYE INJECTION   CARDIOVERSION N/A 01/04/2018   Procedure: CARDIOVERSION;  Surgeon: Dorothy Spark, MD;  Location[facility];  Service: Cardiovascular;  Laterality: N/A;   CARDIOVERSION N/A 09/20/2018   Procedure: CARDIOVERSION;  Surgeon: Sueanne Margarita, MD;  Location: [facility]OSCOPY;  Service: Cardiovascular;  Laterality: N/A;   CARDIOVERSION N/A 10/27/2018   Procedure: CARDIOVERSION;  Surgeon: Buford Dresser, MD;  Location: [facility] END[facility]COPY;  Service: Cardiovascular;  Laterality: N/A;   KNEE SURGERY Bilateral    Torn miniscus   RECTAL EXAM UNDER ANESTHESIA N/A 12/22/2018   Procedure: ANORECTAL  EXAM UNDER ANESTHESIA;  Surgeon: Ileana Roup, MD;  Location: [facility];  Service: General;  Laterality: N/A;   RECTOCELE REPAIR     TONSILLECTOMY     VAGINAL HYSTERECTOMY     Family History  Problem Relation Age of Onset   Hypertension Mother    Hyperlipidemia Mother    Neurologic Disorder Mother 29       GB   Stroke Son    Stroke Maternal Uncle    Stroke Grandchild    Prostate cancer Brother    Social History   Socioeconomic History   Marital status: Widowed    Spouse name: Not on file   Number of children: 2   Years of education: 35   Highest education level: Not on file   Occupational History   Occupation: Retired    Comment: Personnel officer  Tobacco Use   Smoking status: Never   Smokeless tobacco: Never  Scientific laboratory technician Use: Never used  Substance and Sexual Activity   Alcohol use: No    Alcohol/week: 0.0 standard drinks   Drug use: No   Sexual activity: Not Currently    Birth control/protection: Post-menopausal  Other Topics Concern   Not on file  Social History Narrative   Lives alone - children in [place] -    Caffeine use: Soda/tea daily   Good church support group   Social Determinants of Health   Financial Resource Strain: Low Risk    Difficulty of Paying Living Expenses: Not very hard  Food Insecurity: No Food Insecurity   Worried About [place] in the Last  Year: Never true   [place] in the Last Year: Never true  Transportation Needs: No Transportation Needs   Lack of Transportation (Medical): No   Lack of Transportation (Non-Medical): No  Physical Activity: Inactive   Days of Exercise per Week: 0 days   Minutes of Exercise per Session: 0 min  Stress: Stress Concern Present   Feeling of Stress : To some extent  Social Connections: Moderately Integrated   Frequency of Communication with Friends and Family: More than three times a week   Frequency of Social Gatherings with Friends and Family: More than three times a week   Attends Religious Services: More than 4 times per year   Active Member of [employer] or Organizations: Yes   Attends Archivist Meetings: More than 4 times per year   Marital Status: Widowed    Tobacco Counseling Counseling given: Not Answered   Clinical Intake:  Pre-visit preparation completed: Yes  Pain : No/denies pain     BMI - recorded: 46.26 Nutritional Status: BMI > 30  Obese Nutritional Risks: None Diabetes: No  How often do you need to have someone help you when you read instructions, pamphlets, or other written materials from your doctor or  pharmacy?: 1 - Never  Diabetic? no  Interpreter Needed?: No  Information entered by :: Kyngston Pickelsimer, LPN   Activities of Daily Living    04/30/2022    1:27 PM 06/30/2021    1:00 AM  In your present state of health, do you have any difficulty performing the following activities:  Hearing? 0 0  Vision? 0 0  Difficulty concentrating or making decisions? 0 0  Walking or climbing stairs? 1 0  Comment hurts knees   Dressing or bathing? 0 0  Doing errands, shopping? 0 1  Preparing Food and eating ? N   Using the Toilet? N   In the past six months, have you accidently leaked urine? N   Do you have problems with loss of bowel control? N   Managing your Medications? N   Managing your Finances? N   Housekeeping or managing your Housekeeping? N     Patient Care Team: [name], DO as PCP - General (Family Medicine) Minus Breeding, MD as PCP - Cardiology (Cardiology) Thompson Grayer, MD as PCP - Electrophysiology (Cardiology) [name], MD as Consulting Physician (Hematology and Oncology) Ronald Lobo, MD as Consulting Physician (Gastroenterology) Donnie Mesa, MD as Consulting Physician (General Surgery) [facility] [name], MD as Attending Physician (Radiation Oncology) Lavera Guise, [facility] as Pharmacist (Family Medicine) Shea Evans Norva Riffle, LCSW as [employer] (Licensed Clinical Social Worker) Allyn Kenner, MD (Dermatology)  Indicate any recent [place] you may have received from other than [facility] providers in the past year (date may be approximate).     Assessment:   This is a routine wellness examination for [place].  Hearing/Vision screen Hearing Screening - Comments:: Denies hearing difficulties   Vision Screening - Comments:: Wears rx glasses - up to date with routine eye exams with [facility]  Dietary issues and exercise activities discussed: Current Exercise Habits: The patient does not participate  in regular exercise at present, Exercise limited by: orthopedic condition(s);neurologic condition(s)   Goals Addressed             This Visit's Progress    Achieve a Healthy Weight-Obesity       Timeframe:  Long-Range Goal Priority:  High Start Date:  Expected End Date:                       Follow Up Date 05/06/2023    - drink 6 to 8 glasses of water each day - eat 5 or 6 small meals each day - manage portion size - set goal weight    Why is this important?   When you are ready to manage your weight, have a plan and have set a goal, it is time to take action.  Taking small steps to change how you eat and exercise is a good place to start.    Notes:      Exercise 150 min/wk Moderate Activity   Not on track      Depression Screen    04/30/2022    1:23 PM 04/28/2022    4:10 PM 04/14/2022    9:05 AM 02/03/2022    4:06 PM 12/10/2021   10:56 AM 12/02/2021    9:45 AM 07/04/2021   11:15 AM  PHQ 2/9 Scores  PHQ - 2 Score '2 2 2 2 2 3 '$ 0  PHQ- 9 Score '6 7 7 7 10 12     '$ Fall Risk    04/30/2022    1:21 PM 04/28/2022    4:10 PM 04/14/2022    9:05 AM 12/02/2021    9:45 AM 07/04/2021   11:15 AM  Fall Risk   Falls in the past year? '1 1 1 '$ 0 1  Number falls in past yr: 0 0 0  0  Injury with Fall? 0 0 1  0  Risk for fall due to : History of fall(s);Orthopedic patient History of fall(s)     Follow up Falls prevention discussed;Education provided Education provided   Falls prevention discussed    FALL RISK PREVENTION PERTAINING TO THE HOME:  Any stairs in or around the home? Yes  If so, are there any without handrails? No  Home free of loose throw rugs in walkways, pet beds, electrical cords, etc? Yes  Adequate lighting in your home to reduce risk of falls? Yes   ASSISTIVE DEVICES UTILIZED TO PREVENT FALLS:  Life alert? No  Use of a cane, walker or w/c? No  Grab bars in the bathroom? No  Shower chair or bench in shower? Yes  Elevated toilet seat or a  handicapped toilet? Yes   TIMED UP AND GO:  Was the test performed? No . Telephonic visit  Cognitive Function:        04/30/2022    1:26 PM 04/24/2020    9:02 AM 04/24/2019    1:46 PM  6CIT Screen  What Year? 0 points 0 points 0 points  What month? 0 points 0 points 0 points  What time? 0 points 0 points 0 points  Count back from 20 0 points 2 points 0 points  Months in reverse 0 points 0 points 0 points  Repeat phrase 0 points 0 points 0 points  Total Score 0 points 2 points 0 points    Immunizations Immunization History  Administered Date(s) Administered   Moderna Sars-Covid-2 Vaccination 02/29/2020, 03/28/2020, 12/20/2020   Pneumococcal Conjugate-13 12/30/2016   Pneumococcal Polysaccharide-23 02/08/2018    TDAP status: Due, Education has been provided regarding the importance of this vaccine. Advised may receive this vaccine at local pharmacy or Health Dept. Aware to provide a copy of the vaccination record if obtained from local pharmacy or Health Dept. Verbalized acceptance and understanding.  Flu Vaccine status: Declined,  Education has been provided regarding the importance of this vaccine but patient still declined. Advised may receive this vaccine at local pharmacy or Health Dept. Aware to provide a copy of the vaccination record if obtained from local pharmacy or Health Dept. Verbalized acceptance and understanding.  Pneumococcal vaccine status: Up to date  Covid-19 vaccine status: Completed vaccines  Qualifies for Shingles Vaccine? Yes   Zostavax completed No   Shingrix Completed?: No.    Education has been provided regarding the importance of this vaccine. Patient has been advised to call insurance company to determine out of pocket expense if they have not yet received this vaccine. Advised may also receive vaccine at local pharmacy or Health Dept. Verbalized acceptance and understanding.  Screening Tests Health Maintenance  Topic Date Due   Zoster Vaccines-  Shingrix (1 of 2) Never done   COVID-19 Vaccine (4 - Booster for Moderna series) 05/14/2022 (Originally 02/14/2021)   TETANUS/TDAP  05/30/2022 (Originally 08/09/1965)   INFLUENZA VACCINE  07/07/2022   MAMMOGRAM  11/05/2022   DEXA SCAN  04/02/2023   COLONOSCOPY (Pts 45-64yr Insurance coverage will need to be confirmed)  06/26/2024   Pneumonia Vaccine 76 Years old  Completed   Hepatitis C Screening  Completed   HPV VACCINES  Aged Out    Health Maintenance  Health Maintenance Due  Topic Date Due   Zoster Vaccines- Shingrix (1 of 2) Never done    Colorectal cancer screening: Type of screening: Colonoscopy. Completed 06/26/2021. Repeat every 3 years  Mammogram status: Completed 11/05/2021. Repeat every year  Bone Density status: Completed 04/01/2021. Results reflect: Bone density results: OSTEOPENIA. Repeat every 2 years.  Lung Cancer Screening: (Low Dose CT Chest recommended if Age 76-80years, 30 pack-year currently smoking OR have quit w/in 15years.) does not qualify  Additional Screening:  Hepatitis C Screening: does qualify; Completed 02/19/2020  Vision Screening: Recommended annual ophthalmology exams for early detection of glaucoma and other disorders of the eye. Is the patient up to date with their annual eye exam?  Yes  Who is the provider or what is the name of the office in which the patient attends annual eye exams? [facility] If pt is not established with a provider, would they like to be referred to a provider to establish care? No .   Dental Screening: Recommended annual dental exams for proper oral hygiene  Community Resource Referral / Chronic Care Management: CRR required this visit?  No   CCM required this visit?  No      Plan:     I have personally reviewed and noted the following in the patient's chart:   Medical and social history Use of alcohol, tobacco or illicit drugs  Current medications and supplements including opioid prescriptions.  Functional  ability and status Nutritional status Physical activity Advanced directives List of other physicians Hospitalizations, surgeries, and ER visits in previous 12 months Vitals Screenings to include cognitive, depression, and falls Referrals and appointments  In addition, I have reviewed and discussed with patient certain preventive protocols, quality metrics, and best practice recommendations. A written personalized care plan for preventive services as well as general preventive health recommendations were provided to patient.     ASandrea Hammond LPN   [id number]  Nurse Notes: None

## 2022-04-30 NOTE — Patient Instructions (Signed)
[name] Collier , Thank you for taking time to come for your Medicare Wellness Visit. I appreciate your ongoing commitment to your health goals. Please review the following plan we discussed and let me know if I can assist you in the future.   Screening recommendations/referrals: Colonoscopy: Done 06/26/2021 - repeat in 3 years Mammogram: Done 11/05/2021 - Repeat annually Bone Density: Done 04/01/2021 - Repeat every 2 years  Recommended yearly ophthalmology/optometry visit for glaucoma screening and checkup Recommended yearly dental visit for hygiene and checkup  Vaccinations: Influenza vaccine: Declined - recommended annually in fall Pneumococcal vaccine: done 12/30/2016 & 02/08/2018 Tdap vaccine: Due - recommended every 10 years Shingles vaccine: Due - Shingrix is 2 doses 2-6 months apart and over 90% effective     Covid-19: Done  02/29/2020, 03/28/2020, & 12/20/2020  Advanced directives: Please bring a copy of your health care power of attorney and living will to the office to be added to your chart at your convenience.   Conditions/risks identified: Aim for 30 minutes of exercise or brisk walking, 6-8 glasses of water, and 5 servings of fruits and vegetables each day.   Next appointment: Follow up in one year for your annual wellness visit    Preventive Care 65 Years and Older, Female Preventive care refers to lifestyle choices and visits with your health care provider that can promote health and wellness. What does preventive care include? A yearly physical exam. This is also called an annual well check. Dental exams once or twice a year. Routine eye exams. Ask your health care provider how often you should have your eyes checked. Personal lifestyle choices, including: Daily care of your teeth and gums. Regular physical activity. Eating a healthy diet. Avoiding tobacco and drug use. Limiting alcohol use. Practicing safe sex. Taking low-dose aspirin every day. Taking vitamin and mineral  supplements as recommended by your health care provider. What happens during an annual well check? The services and screenings done by your health care provider during your annual well check will depend on your age, overall health, lifestyle risk factors, and family history of disease. Counseling  Your health care provider may ask you questions about your: Alcohol use. Tobacco use. Drug use. Emotional well-being. Home and relationship well-being. Sexual activity. Eating habits. History of falls. Memory and ability to understand (cognition). Work and work Statistician. Reproductive health. Screening  You may have the following tests or measurements: Height, weight, and BMI. Blood pressure. Lipid and cholesterol levels. These may be checked every 5 years, or more frequently if you are over 64 years old. Skin check. Lung cancer screening. You may have this screening every year starting at age 72 if you have a 30-pack-year history of smoking and currently smoke or have quit within the past 15 years. Fecal occult blood test (FOBT) of the stool. You may have this test every year starting at age 51. Flexible sigmoidoscopy or colonoscopy. You may have a sigmoidoscopy every 5 years or a colonoscopy every 10 years starting at age 60. Hepatitis C blood test. Hepatitis B blood test. Sexually transmitted disease (STD) testing. Diabetes screening. This is done by checking your blood sugar (glucose) after you have not eaten for a while (fasting). You may have this done every 1-3 years. Bone density scan. This is done to screen for osteoporosis. You may have this done starting at age 63. Mammogram. This may be done every 1-2 years. Talk to your health care provider about how often you should have regular mammograms. Talk with  your health care provider about your test results, treatment options, and if necessary, the need for more tests. Vaccines  Your health care provider may recommend certain  vaccines, such as: Influenza vaccine. This is recommended every year. Tetanus, diphtheria, and acellular pertussis (Tdap, Td) vaccine. You may need a Td booster every 10 years. Zoster vaccine. You may need this after age 49. Pneumococcal 13-valent conjugate (PCV13) vaccine. One dose is recommended after age 42. Pneumococcal polysaccharide (PPSV23) vaccine. One dose is recommended after age 67. Talk to your health care provider about which screenings and vaccines you need and how often you need them. This information is not intended to replace advice given to you by your health care provider. Make sure you discuss any questions you have with your health care provider. Document Released: 12/20/2015 Document Revised: 08/12/2016 Document Reviewed: 09/24/2015 Elsevier Interactive Patient Education  2017 [place] Prevention in the Home Falls can cause injuries. They can happen to people of all ages. There are many things you can do to make your home safe and to help prevent falls. What can I do on the outside of my home? Regularly fix the edges of walkways and driveways and fix any cracks. Remove anything that might make you trip as you walk through a door, such as a raised step or threshold. Trim any bushes or trees on the path to your home. Use bright outdoor lighting. Clear any walking paths of anything that might make someone trip, such as rocks or tools. Regularly check to see if handrails are loose or broken. Make sure that both sides of any steps have handrails. Any raised decks and porches should have guardrails on the edges. Have any leaves, snow, or ice cleared regularly. Use sand or salt on walking paths during winter. Clean up any spills in your garage right away. This includes oil or grease spills. What can I do in the bathroom? Use night lights. Install grab bars by the toilet and in the tub and shower. Do not use towel bars as grab bars. Use non-skid mats or decals in  the tub or shower. If you need to sit down in the shower, use a plastic, non-slip stool. Keep the floor dry. Clean up any water that spills on the floor as soon as it happens. Remove soap buildup in the tub or shower regularly. Attach bath mats securely with double-sided non-slip rug tape. Do not have throw rugs and other things on the floor that can make you trip. What can I do in the bedroom? Use night lights. Make sure that you have a light by your bed that is easy to reach. Do not use any sheets or blankets that are too big for your bed. They should not hang down onto the floor. Have a firm chair that has side arms. You can use this for support while you get dressed. Do not have throw rugs and other things on the floor that can make you trip. What can I do in the kitchen? Clean up any spills right away. Avoid walking on wet floors. Keep items that you use a lot in easy-to-reach places. If you need to reach something above you, use a strong step stool that has a grab bar. Keep electrical cords out of the way. Do not use floor polish or wax that makes floors slippery. If you must use wax, use non-skid floor wax. Do not have throw rugs and other things on the floor that can make you trip.  What can I do with my stairs? Do not leave any items on the stairs. Make sure that there are handrails on both sides of the stairs and use them. Fix handrails that are broken or loose. Make sure that handrails are as long as the stairways. Check any carpeting to make sure that it is firmly attached to the stairs. Fix any carpet that is loose or worn. Avoid having throw rugs at the top or bottom of the stairs. If you do have throw rugs, attach them to the floor with carpet tape. Make sure that you have a light switch at the top of the stairs and the bottom of the stairs. If you do not have them, ask someone to add them for you. What else can I do to help prevent falls? Wear shoes that: Do not have high  heels. Have rubber bottoms. Are comfortable and fit you well. Are closed at the toe. Do not wear sandals. If you use a stepladder: Make sure that it is fully opened. Do not climb a closed stepladder. Make sure that both sides of the stepladder are locked into place. Ask someone to hold it for you, if possible. Clearly mark and make sure that you can see: Any grab bars or handrails. First and last steps. Where the edge of each step is. Use tools that help you move around (mobility aids) if they are needed. These include: Canes. Walkers. Scooters. Crutches. Turn on the lights when you go into a dark area. Replace any light bulbs as soon as they burn out. Set up your furniture so you have a clear path. Avoid moving your furniture around. If any of your floors are uneven, fix them. If there are any pets around you, be aware of where they are. Review your medicines with your doctor. Some medicines can make you feel dizzy. This can increase your chance of falling. Ask your doctor what other things that you can do to help prevent falls. This information is not intended to replace advice given to you by your health care provider. Make sure you discuss any questions you have with your health care provider. Document Released: 09/19/2009 Document Revised: 04/30/2016 Document Reviewed: 12/28/2014 Elsevier Interactive Patient Education  2017 [employer].

## 2022-05-15 DIAGNOSIS — M9903 Segmental and somatic dysfunction of lumbar region: Secondary | ICD-10-CM | POA: Diagnosis not present

## 2022-05-15 DIAGNOSIS — S134XXA Sprain of ligaments of cervical spine, initial encounter: Secondary | ICD-10-CM | POA: Diagnosis not present

## 2022-05-15 DIAGNOSIS — M9901 Segmental and somatic dysfunction of cervical region: Secondary | ICD-10-CM | POA: Diagnosis not present

## 2022-05-15 DIAGNOSIS — S233XXA Sprain of ligaments of thoracic spine, initial encounter: Secondary | ICD-10-CM | POA: Diagnosis not present

## 2022-05-15 DIAGNOSIS — M9902 Segmental and somatic dysfunction of thoracic region: Secondary | ICD-10-CM | POA: Diagnosis not present

## 2022-05-15 DIAGNOSIS — M546 Pain in thoracic spine: Secondary | ICD-10-CM | POA: Diagnosis not present

## 2022-05-15 DIAGNOSIS — S338XXA Sprain of other parts of lumbar spine and pelvis, initial encounter: Secondary | ICD-10-CM | POA: Diagnosis not present

## 2022-05-19 DIAGNOSIS — M9902 Segmental and somatic dysfunction of thoracic region: Secondary | ICD-10-CM | POA: Diagnosis not present

## 2022-05-19 DIAGNOSIS — S338XXA Sprain of other parts of lumbar spine and pelvis, initial encounter: Secondary | ICD-10-CM | POA: Diagnosis not present

## 2022-05-19 DIAGNOSIS — S134XXA Sprain of ligaments of cervical spine, initial encounter: Secondary | ICD-10-CM | POA: Diagnosis not present

## 2022-05-19 DIAGNOSIS — M546 Pain in thoracic spine: Secondary | ICD-10-CM | POA: Diagnosis not present

## 2022-05-19 DIAGNOSIS — M9901 Segmental and somatic dysfunction of cervical region: Secondary | ICD-10-CM | POA: Diagnosis not present

## 2022-05-19 DIAGNOSIS — M9903 Segmental and somatic dysfunction of lumbar region: Secondary | ICD-10-CM | POA: Diagnosis not present

## 2022-05-19 DIAGNOSIS — S233XXA Sprain of ligaments of thoracic spine, initial encounter: Secondary | ICD-10-CM | POA: Diagnosis not present

## 2022-05-21 DIAGNOSIS — S233XXA Sprain of ligaments of thoracic spine, initial encounter: Secondary | ICD-10-CM | POA: Diagnosis not present

## 2022-05-21 DIAGNOSIS — M9903 Segmental and somatic dysfunction of lumbar region: Secondary | ICD-10-CM | POA: Diagnosis not present

## 2022-05-21 DIAGNOSIS — M546 Pain in thoracic spine: Secondary | ICD-10-CM | POA: Diagnosis not present

## 2022-05-21 DIAGNOSIS — M9902 Segmental and somatic dysfunction of thoracic region: Secondary | ICD-10-CM | POA: Diagnosis not present

## 2022-05-21 DIAGNOSIS — M9901 Segmental and somatic dysfunction of cervical region: Secondary | ICD-10-CM | POA: Diagnosis not present

## 2022-05-21 DIAGNOSIS — S134XXA Sprain of ligaments of cervical spine, initial encounter: Secondary | ICD-10-CM | POA: Diagnosis not present

## 2022-05-21 DIAGNOSIS — S338XXA Sprain of other parts of lumbar spine and pelvis, initial encounter: Secondary | ICD-10-CM | POA: Diagnosis not present

## 2022-05-25 DIAGNOSIS — M9903 Segmental and somatic dysfunction of lumbar region: Secondary | ICD-10-CM | POA: Diagnosis not present

## 2022-05-25 DIAGNOSIS — S338XXA Sprain of other parts of lumbar spine and pelvis, initial encounter: Secondary | ICD-10-CM | POA: Diagnosis not present

## 2022-05-25 DIAGNOSIS — M546 Pain in thoracic spine: Secondary | ICD-10-CM | POA: Diagnosis not present

## 2022-05-25 DIAGNOSIS — M9902 Segmental and somatic dysfunction of thoracic region: Secondary | ICD-10-CM | POA: Diagnosis not present

## 2022-05-25 DIAGNOSIS — S233XXA Sprain of ligaments of thoracic spine, initial encounter: Secondary | ICD-10-CM | POA: Diagnosis not present

## 2022-05-25 DIAGNOSIS — S134XXA Sprain of ligaments of cervical spine, initial encounter: Secondary | ICD-10-CM | POA: Diagnosis not present

## 2022-05-25 DIAGNOSIS — M9901 Segmental and somatic dysfunction of cervical region: Secondary | ICD-10-CM | POA: Diagnosis not present

## 2022-05-27 ENCOUNTER — Telehealth: Payer: Self-pay | Admitting: Family Medicine

## 2022-05-27 DIAGNOSIS — M9903 Segmental and somatic dysfunction of lumbar region: Secondary | ICD-10-CM | POA: Diagnosis not present

## 2022-05-27 DIAGNOSIS — M546 Pain in thoracic spine: Secondary | ICD-10-CM | POA: Diagnosis not present

## 2022-05-27 DIAGNOSIS — S233XXA Sprain of ligaments of thoracic spine, initial encounter: Secondary | ICD-10-CM | POA: Diagnosis not present

## 2022-05-27 DIAGNOSIS — S134XXA Sprain of ligaments of cervical spine, initial encounter: Secondary | ICD-10-CM | POA: Diagnosis not present

## 2022-05-27 DIAGNOSIS — M9902 Segmental and somatic dysfunction of thoracic region: Secondary | ICD-10-CM | POA: Diagnosis not present

## 2022-05-27 DIAGNOSIS — M9901 Segmental and somatic dysfunction of cervical region: Secondary | ICD-10-CM | POA: Diagnosis not present

## 2022-05-27 DIAGNOSIS — S338XXA Sprain of other parts of lumbar spine and pelvis, initial encounter: Secondary | ICD-10-CM | POA: Diagnosis not present

## 2022-05-27 NOTE — Telephone Encounter (Signed)
Patient scheduled for an appt 06/22 with Monia Pouch

## 2022-05-27 NOTE — Telephone Encounter (Signed)
Pt called to let PCP know that her left leg is swollen and red again with fever to it, like it was at her last most recent visit.  Wants to know if PCP can send in refill on her Cellulitis medication.

## 2022-05-28 ENCOUNTER — Encounter: Payer: Self-pay | Admitting: Family Medicine

## 2022-05-28 ENCOUNTER — Ambulatory Visit (INDEPENDENT_AMBULATORY_CARE_PROVIDER_SITE_OTHER): Payer: PPO | Admitting: Family Medicine

## 2022-05-28 VITALS — BP 128/75 | HR 73 | Temp 98.3°F | Resp 20 | Ht 65.0 in | Wt 278.0 lb

## 2022-05-28 DIAGNOSIS — L03116 Cellulitis of left lower limb: Secondary | ICD-10-CM | POA: Diagnosis not present

## 2022-05-28 MED ORDER — DOXYCYCLINE HYCLATE 100 MG PO TABS: 100.0000 mg | ORAL_TABLET | Freq: Two times a day (BID) | ORAL | 0 refills | Status: AC

## 2022-05-28 NOTE — Progress Notes (Signed)
Subjective:  Patient ID: [name] Collier, female    DOB: [birth year], 76 y.o.   MRN: [id number]  Patient Care Team: [name], DO as PCP - General (Family Medicine) Minus Breeding, MD as PCP - Cardiology (Cardiology) Thompson Grayer, MD as PCP - Electrophysiology (Cardiology) [name], MD as Consulting Physician (Hematology and Oncology) Ronald Lobo, MD as Consulting Physician (Gastroenterology) Donnie Mesa, MD as Consulting Physician (General Surgery) [facility] [name], MD as Attending Physician (Radiation Oncology) Lavera Guise, [facility] as Pharmacist (Family Medicine) Shea Evans Norva Riffle, LCSW as [place] [employer] (Licensed Clinical Social Worker) Allyn Kenner, MD (Dermatology)   Chief Complaint:  bilateral legs red and swelling   HPI: [name] Collier is a 76 y.o. female presenting on 05/28/2022 for bilateral legs red and swelling   Pt presents today with complaints of redness to left lower leg. She has been treated for cellulitis in left lower leg last month. She states the redness resolved for a few days and then returned. No known injury. No fever, chills, weakness, confusion, or fatigue.     Relevant past medical, surgical, family, and social history reviewed and updated as indicated.  Allergies and medications reviewed and updated. Data reviewed: Chart in Epic.   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   GERD (gastroesophageal reflux disease)    HTN (hypertension)    Hyperlipidemia    Personal history of radiation therapy    TIA (transient ischemic attack)     Past Surgical History:  Procedure Laterality Date   BOTOX INJECTION N/A 12/22/2018   Procedure: BOTOX INJECTION INTO ANAL SPHINCTER;  Surgeon: Ileana Roup, MD;  Location: [facility];  Service: General;  Laterality: N/A;   BREAST BIOPSY Left 11/26/2020    BREAST LUMPECTOMY WITH RADIOACTIVE SEED AND SENTINEL LYMPH NODE BIOPSY Left 01/07/2021   Procedure: LEFT BREAST LUMPECTOMY WITH RADIOACTIVE SEED AND LEFT AXILLARY SENTINEL LYMPH NODE BIOPSY WITH BLUE DYE INJECTION;  Surgeon: Donnie Mesa, MD;  Location: [facility];  Service: General;  Laterality: Left;  LMA WITH PECTORAL BLOCK, BLUE DYE INJECTION   CARDIOVERSION N/A 01/04/2018   Procedure: CARDIOVERSION;  Surgeon: Dorothy Spark, MD;  Location: [facility];  Service: Cardiovascular;  Laterality: N/A;   CARDIOVERSION N/A 09/20/2018   Procedure: CARDIOVERSION;  Surgeon: Sueanne Margarita, MD;  Location: [facility];  Service: Cardiovascular;  Laterality: N/A;   CARDIOVERSION N/A 10/27/2018   Procedure: CARDIOVERSION;  Surgeon: Buford Dresser, MD;  Location: [facility];  Service: Cardiovascular;  Laterality: N/A;   KNEE SURGERY Bilateral    Torn miniscus   RECTAL EXAM UNDER ANESTHESIA N/A 12/22/2018   Procedure: ANORECTAL  EXAM UNDER ANESTHESIA;  Surgeon: Ileana Roup, MD;  Location: [facility];  Service: General;  Laterality: N/A;   RECTOCELE REPAIR     TONSILLECTOMY     VAGINAL HYSTERECTOMY      Social History   Socioeconomic History   Marital status: Widowed    Spouse name: Not on file   Number of children: 2   Years of education: 12   Highest education level: Not on file  Occupational History   Occupation: Retired    Comment: Personnel officer  Tobacco Use   Smoking status: Never   Smokeless tobacco: Never  Vaping Use   Vaping Use: Never used  Substance and Sexual Activity  Alcohol use: No    Alcohol/week: 0.0 standard drinks of alcohol   Drug use: No   Sexual activity: Not Currently    Birth control/protection: Post-menopausal  Other Topics Concern   Not on file  Social History Narrative   Lives alone - children in [place] and [place] -    Caffeine use: Soda/tea daily   Good church support group   Social Determinants of Health    Financial Resource Strain: Low Risk  (04/30/2022)   Overall Financial Resource Strain (CARDIA)    Difficulty of Paying Living Expenses: Not very hard  Food Insecurity: No Food Insecurity (04/30/2022)   Hunger Vital Sign    Worried About Running Out of Food in the Last Year: Never true    Ran Out of Food in the Last Year: Never true  Transportation Needs: No Transportation Needs (04/30/2022)   PRAPARE - Hydrologist (Medical): No    Lack of Transportation (Non-Medical): No  Physical Activity: Inactive (04/30/2022)   Exercise Vital Sign    Days of Exercise per Week: 0 days    Minutes of Exercise per Session: 0 min  Stress: Stress Concern Present (04/30/2022)   [place]    Feeling of Stress : To some extent  Social Connections: Moderately Integrated (04/30/2022)   Social Connection and Isolation Panel [NHANES]    Frequency of Communication with Friends and Family: More than three times a week    Frequency of Social Gatherings with Friends and Family: More than three times a week    Attends Religious Services: More than 4 times per year    Active Member of [employer] or Organizations: Yes    Attends Archivist Meetings: More than 4 times per year    Marital Status: Widowed  Intimate Partner Violence: Not At Risk (04/30/2022)   Humiliation, Afraid, Rape, and Kick questionnaire    Fear of Current or Ex-Partner: No    Emotionally Abused: No    Physically Abused: No    Sexually Abused: No    Outpatient Encounter Medications as of 05/28/2022  Medication Sig   acetaminophen (TYLENOL) 500 MG tablet Take 1,000 mg by mouth every 6 (six) hours as needed for moderate pain or headache.   amLODipine (NORVASC) 5 MG tablet Take 1.5 tablets (7.5 mg total) by mouth daily.   anastrozole (ARIMIDEX) 1 MG tablet TAKE 1 TABLET BY MOUTH  DAILY   apixaban (ELIQUIS) 5 MG TABS tablet Take 1 tablet (5 mg total)  by mouth 2 (two) times daily.   Cholecalciferol (VITAMIN D3) 5000 units CAPS Take 5,000 Units by mouth daily with supper.   dofetilide (TIKOSYN) 500 MCG capsule TAKE 1 CAPSULE BY MOUTH TWICE A DAY   doxycycline (VIBRA-TABS) 100 MG tablet Take 1 tablet (100 mg total) by mouth 2 (two) times daily for 10 days. 1 po bid   DULoxetine (CYMBALTA) 60 MG capsule Take 1 capsule (60 mg total) by mouth daily. Note dose change   ezetimibe (ZETIA) 10 MG tablet Take 1 tablet (10 mg total) by mouth daily.   famotidine (PEPCID) 20 MG tablet Take 1 tablet (20 mg total) by mouth 2 (two) times daily.   furosemide (LASIX) 20 MG tablet Take 1 tablet (20 mg total) by mouth daily.   olmesartan (BENICAR) 5 MG tablet Take 1 tablet (5 mg total) by mouth daily.   potassium chloride SA (KLOR-CON M) 20 MEQ tablet Take 1 tablet (20 mEq  total) by mouth 2 (two) times daily.   Calcium Carbonate-Vit D-Min (CALCIUM 1200 PO) Take 1,200 mg by mouth every morning.   No facility-administered encounter medications on file as of 05/28/2022.    Allergies  Allergen Reactions   Augmentin [Amoxicillin-Pot Clavulanate] Other (See Comments)    c-diff Has patient had a PCN reaction causing immediate rash, facial/tongue/throat swelling, SOB or lightheadedness with hypotension: No Has patient had a PCN reaction causing severe rash involving mucus membranes or skin necrosis: No Has patient had a PCN reaction that required hospitalization: No Has patient had a PCN reaction occurring within the last 10 years: Yes If all of the above answers are "NO", then may proceed with Cephalosporin use.    Codeine Palpitations and Rash   Prednisone Other (See Comments)    Jittery, red in the face    Review of Systems  Constitutional:  Negative for activity change, appetite change, chills, diaphoresis, fatigue, fever and unexpected weight change.  Respiratory:  Negative for cough and shortness of breath.   Cardiovascular:  Positive for leg swelling.  Negative for chest pain and palpitations.  Gastrointestinal:  Negative for abdominal pain, diarrhea, nausea and vomiting.  Genitourinary:  Negative for decreased urine volume.  Skin:  Positive for color change and rash.  Neurological:  Negative for weakness.  Psychiatric/Behavioral:  Negative for confusion.   All other systems reviewed and are negative.       Objective:  BP 128/75   Pulse 73   Temp 98.3 F (36.8 C) (Temporal)   Resp 20   Ht 5' 5" (1.651 m)   Wt 278 lb (126.1 kg)   SpO2 92%   BMI 46.26 kg/m    Wt Readings from Last 3 Encounters:  05/28/22 278 lb (126.1 kg)  04/30/22 278 lb (126.1 kg)  04/28/22 278 lb (126.1 kg)    Physical Exam Vitals and nursing note reviewed.  Constitutional:      Appearance: Normal appearance. She is obese.  HENT:     Head: Normocephalic and atraumatic.  Eyes:     Pupils: Pupils are equal, round, and reactive to light.  Cardiovascular:     Rate and Rhythm: Normal rate and regular rhythm.     Heart sounds: Normal heart sounds.  Pulmonary:     Effort: Pulmonary effort is normal.     Breath sounds: Normal breath sounds.  Skin:    General: Skin is warm and dry.     Capillary Refill: Capillary refill takes less than 2 seconds.     Findings: Erythema and rash present.       Neurological:     General: No focal deficit present.     Mental Status: She is alert and oriented to person, place, and time.  Psychiatric:        Mood and Affect: Mood normal.        Behavior: Behavior normal.        Thought Content: Thought content normal.        Judgment: Judgment normal.     Results for orders placed or performed in visit on 04/24/22  Lipid panel  Result Value Ref Range   Cholesterol, Total 173 100 - 199 mg/dL   Triglycerides 103 0 - 149 mg/dL   HDL 56 >39 mg/dL   VLDL Cholesterol Cal 19 5 - 40 mg/dL   LDL Chol Calc (NIH) 98 0 - 99 mg/dL   Chol/HDL Ratio 3.1 0.0 - 4.4 ratio  CMP14+EGFR  Result Value Ref Range  Glucose 113 (H)  70 - 99 mg/dL   BUN 14 8 - 27 mg/dL   Creatinine, Ser 0.74 0.57 - 1.00 mg/dL   eGFR 84 >59 mL/min/1.73   BUN/Creatinine Ratio 19 12 - 28   Sodium 142 134 - 144 mmol/L   Potassium 4.1 3.5 - 5.2 mmol/L   Chloride 105 96 - 106 mmol/L   CO2 23 20 - 29 mmol/L   Calcium 9.3 8.7 - 10.3 mg/dL   Total Protein 6.7 6.0 - 8.5 g/dL   Albumin 3.9 3.7 - 4.7 g/dL   Globulin, Total 2.8 1.5 - 4.5 g/dL   Albumin/Globulin Ratio 1.4 1.2 - 2.2   Bilirubin Total 0.7 0.0 - 1.2 mg/dL   Alkaline Phosphatase 93 44 - 121 IU/L   AST 18 0 - 40 IU/L   ALT 20 0 - 32 IU/L  T4, free  Result Value Ref Range   Free T4 1.31 0.82 - 1.77 ng/dL  TSH  Result Value Ref Range   TSH 4.270 0.450 - 4.500 uIU/mL  Bayer DCA Hb A1c Waived  Result Value Ref Range   HB A1C (BAYER DCA - WAIVED) 5.6 4.8 - 5.6 %       Pertinent labs & imaging results that were available during my care of the patient were reviewed by me and considered in my medical decision making.  Assessment & Plan:  Kemani was seen today for bilateral legs red and swelling.  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 No indications of systemic illness. Symptomatic care discussed in detail. Doxycycline as prescribed. Report new, worsening, or persistent symptoms.  -     doxycycline (VIBRA-TABS) 100 MG tablet; Take 1 tablet (100 mg total) by mouth 2 (two) times daily for 10 days. 1 po bid     Continue all other maintenance medications.  Follow up plan: Return if symptoms worsen or fail to improve.   Continue healthy lifestyle choices, including diet (rich in fruits, vegetables, and lean proteins, and low in salt and simple carbohydrates) and exercise (at least 30 minutes of moderate physical activity daily).  Educational handout given for cellulitis  The above assessment and management plan was discussed with the patient. The patient verbalized understanding of and has agreed to the management plan.  Patient is aware to call the clinic if they develop any new symptoms or if symptoms persist or worsen. Patient is aware when to return to the clinic for a follow-up visit. Patient educated on when it is appropriate to go to the emergency department.   Monia Pouch, FNP-C [facility] [phone]

## 2022-06-01 DIAGNOSIS — S233XXA Sprain of ligaments of thoracic spine, initial encounter: Secondary | ICD-10-CM | POA: Diagnosis not present

## 2022-06-01 DIAGNOSIS — M9903 Segmental and somatic dysfunction of lumbar region: Secondary | ICD-10-CM | POA: Diagnosis not present

## 2022-06-01 DIAGNOSIS — S338XXA Sprain of other parts of lumbar spine and pelvis, initial encounter: Secondary | ICD-10-CM | POA: Diagnosis not present

## 2022-06-01 DIAGNOSIS — S134XXA Sprain of ligaments of cervical spine, initial encounter: Secondary | ICD-10-CM | POA: Diagnosis not present

## 2022-06-01 DIAGNOSIS — M9902 Segmental and somatic dysfunction of thoracic region: Secondary | ICD-10-CM | POA: Diagnosis not present

## 2022-06-01 DIAGNOSIS — M9901 Segmental and somatic dysfunction of cervical region: Secondary | ICD-10-CM | POA: Diagnosis not present

## 2022-06-01 DIAGNOSIS — M546 Pain in thoracic spine: Secondary | ICD-10-CM | POA: Diagnosis not present

## 2022-06-04 DIAGNOSIS — S338XXA Sprain of other parts of lumbar spine and pelvis, initial encounter: Secondary | ICD-10-CM | POA: Diagnosis not present

## 2022-06-04 DIAGNOSIS — S134XXA Sprain of ligaments of cervical spine, initial encounter: Secondary | ICD-10-CM | POA: Diagnosis not present

## 2022-06-04 DIAGNOSIS — M546 Pain in thoracic spine: Secondary | ICD-10-CM | POA: Diagnosis not present

## 2022-06-04 DIAGNOSIS — M9902 Segmental and somatic dysfunction of thoracic region: Secondary | ICD-10-CM | POA: Diagnosis not present

## 2022-06-04 DIAGNOSIS — S233XXA Sprain of ligaments of thoracic spine, initial encounter: Secondary | ICD-10-CM | POA: Diagnosis not present

## 2022-06-04 DIAGNOSIS — M9901 Segmental and somatic dysfunction of cervical region: Secondary | ICD-10-CM | POA: Diagnosis not present

## 2022-06-04 DIAGNOSIS — M9903 Segmental and somatic dysfunction of lumbar region: Secondary | ICD-10-CM | POA: Diagnosis not present

## 2022-06-05 ENCOUNTER — Ambulatory Visit (INDEPENDENT_AMBULATORY_CARE_PROVIDER_SITE_OTHER): Payer: PPO | Admitting: Family Medicine

## 2022-06-05 ENCOUNTER — Encounter: Payer: Self-pay | Admitting: Family Medicine

## 2022-06-05 VITALS — BP 138/82 | HR 76 | Temp 97.5°F | Ht 65.0 in | Wt 277.6 lb

## 2022-06-05 DIAGNOSIS — F410 Panic disorder [episodic paroxysmal anxiety] without agoraphobia: Secondary | ICD-10-CM

## 2022-06-05 DIAGNOSIS — R600 Localized edema: Secondary | ICD-10-CM

## 2022-06-05 DIAGNOSIS — F411 Generalized anxiety disorder: Secondary | ICD-10-CM | POA: Diagnosis not present

## 2022-06-05 MED ORDER — LORAZEPAM 0.5 MG PO TABS
0.2500 mg | ORAL_TABLET | Freq: Every day | ORAL | 1 refills | Status: DC | PRN
Start: 2022-06-05 — End: 2022-07-21

## 2022-06-05 NOTE — Progress Notes (Signed)
Subjective: CC: Anxiety disorder PCP: [name] Norlander, DO NWG:NFAOZ BRYNA RAZAVI is a 76 y.o. female presenting to clinic today for:  1.  Anxiety disorder Last visit we advanced patient's Cymbalta to 60 mg daily in efforts to better control anxiety.  Unfortunately this resulted in sleeping difficulties and nightmares and she was having a recurrent dream of being in some type of mental health facility.  She notes that she has ongoing worry, anxiety.  Much of this is precipitated by the help of her granddaughter and the ongoing use of the [facility] that she attends potentially just banding.  She often finds it very difficult to relax.  2.  Leg edema Patient suffers from bilateral leg edema.  She has been having recurrent cellulitis in the lower extremities with most recent treatment just a few weeks ago with doxycycline.  She continues to have some hyperpigmentation but the overall look at the legs have improved.  Does not wear compression hose but would be willing to utilize.   ROS: Per HPI  Allergies  Allergen Reactions   Augmentin [Amoxicillin-Pot Clavulanate] Other (See Comments)    c-diff Has patient had a PCN reaction causing immediate rash, facial/tongue/throat swelling, SOB or lightheadedness with hypotension: No Has patient had a PCN reaction causing severe rash involving mucus membranes or skin necrosis: No Has patient had a PCN reaction that required hospitalization: No Has patient had a PCN reaction occurring within the last 10 years: Yes If all of the above answers are "NO", then may proceed with Cephalosporin use.    Codeine Palpitations and Rash   Prednisone Other (See Comments)    Jittery, red in the face   Past Medical History:  Diagnosis Date   Anxiety    Atrial fibrillation ([place])    Cancer ([place]) 11/2020   left breast IMC   GERD (gastroesophageal reflux disease)    HTN (hypertension)    Hyperlipidemia    Personal history of radiation therapy    TIA  (transient ischemic attack)     Current Outpatient Medications:    acetaminophen (TYLENOL) 500 MG tablet, Take 1,000 mg by mouth every 6 (six) hours as needed for moderate pain or headache., Disp: , Rfl:    amLODipine (NORVASC) 5 MG tablet, Take 1.5 tablets (7.5 mg total) by mouth daily., Disp: 135 tablet, Rfl: 3   anastrozole (ARIMIDEX) 1 MG tablet, TAKE 1 TABLET BY MOUTH  DAILY, Disp: 90 tablet, Rfl: 3   apixaban (ELIQUIS) 5 MG TABS tablet, Take 1 tablet (5 mg total) by mouth 2 (two) times daily., Disp: 180 tablet, Rfl: 2   Cholecalciferol (VITAMIN D3) 5000 units CAPS, Take 5,000 Units by mouth daily with supper., Disp: , Rfl:    dofetilide (TIKOSYN) 500 MCG capsule, TAKE 1 CAPSULE BY MOUTH TWICE A DAY, Disp: 180 capsule, Rfl: 2   doxycycline (VIBRA-TABS) 100 MG tablet, Take 1 tablet (100 mg total) by mouth 2 (two) times daily for 10 days. 1 po bid, Disp: 20 tablet, Rfl: 0   DULoxetine (CYMBALTA) 60 MG capsule, Take 1 capsule (60 mg total) by mouth daily. Note dose change, Disp: 90 capsule, Rfl: 3   ezetimibe (ZETIA) 10 MG tablet, Take 1 tablet (10 mg total) by mouth daily., Disp: 90 tablet, Rfl: 3   famotidine (PEPCID) 20 MG tablet, Take 1 tablet (20 mg total) by mouth 2 (two) times daily., Disp: 180 tablet, Rfl: 3   furosemide (LASIX) 20 MG tablet, Take 1 tablet (20 mg total) by mouth  daily., Disp: 90 tablet, Rfl: 3   olmesartan (BENICAR) 5 MG tablet, Take 1 tablet (5 mg total) by mouth daily., Disp: 90 tablet, Rfl: 3   potassium chloride SA (KLOR-CON M) 20 MEQ tablet, Take 1 tablet (20 mEq total) by mouth 2 (two) times daily., Disp: 180 tablet, Rfl: 3 Social History   Socioeconomic History   Marital status: Widowed    Spouse name: Not on file   Number of children: 2   Years of education: 80   Highest education level: Not on file  Occupational History   Occupation: Retired    Comment: Personnel officer  Tobacco Use   Smoking status: Never   Smokeless tobacco: Never  Brewing technologist Use: Never used  Substance and Sexual Activity   Alcohol use: No    Alcohol/week: 0.0 standard drinks of alcohol   Drug use: No   Sexual activity: Not Currently    Birth control/protection: Post-menopausal  Other Topics Concern   Not on file  Social History Narrative   Lives alone - children in [place] -    Caffeine use: Soda/tea daily   Good church support group   Social Determinants of Health   Financial Resource Strain: Low Risk  (04/30/2022)   Overall Financial Resource Strain (CARDIA)    Difficulty of Paying Living Expenses: Not very hard  Food Insecurity: No Food Insecurity (04/30/2022)   Hunger Vital Sign    Worried About Running Out of Food in the Last Year: Never true    Ran Out of Food in the Last Year: Never true  Transportation Needs: No Transportation Needs (04/30/2022)   PRAPARE - Hydrologist (Medical): No    Lack of Transportation (Non-Medical): No  Physical Activity: Inactive (04/30/2022)   Exercise Vital Sign    Days of Exercise per Week: 0 days    Minutes of Exercise per Session: 0 min  Stress: Stress Concern Present (04/30/2022)   [place]    Feeling of Stress : To some extent  Social Connections: Moderately Integrated (04/30/2022)   Social Connection and Isolation Panel [NHANES]    Frequency of Communication with Friends and Family: More than three times a week    Frequency of Social Gatherings with Friends and Family: More than three times a week    Attends Religious Services: More than 4 times per year    Active Member of [employer] or Organizations: Yes    Attends Archivist Meetings: More than 4 times per year    Marital Status: Widowed  Intimate Partner Violence: Not At Risk (04/30/2022)   Humiliation, Afraid, Rape, and Kick questionnaire    Fear of Current or Ex-Partner: No    Emotionally Abused: No    Physically Abused: No     Sexually Abused: No   Family History  Problem Relation Age of Onset   Hypertension Mother    Hyperlipidemia Mother    Neurologic Disorder Mother 38       GB   Stroke Son    Stroke Maternal Uncle    Stroke Grandchild    Prostate cancer Brother     Objective: Office vital signs reviewed. BP 138/82   Pulse 76   Temp (!) 97.5 F (36.4 C)   Ht '5\' 5"'$  (1.651 m)   Wt 277 lb 9.6 oz (125.9 kg)   SpO2 92%   BMI 46.20 kg/m   Physical Examination:  General: Awake, alert, well nourished, morbid obesity.  No acute distress HEENT: Sclera white.  Moist mucous membranes Cardio: regular rate and rhythm, S1S2 heard, soft systolic murmur Pulm: clear to auscultation bilaterally, no wheezes, rhonchi or rales; normal work of breathing on room air MSK: Relating independently Skin: Postinflammatory hyperpigmentation noted along the left anterior and medial aspect of the lower leg.  There is no induration, warmth or significant erythema associated with this hyperpigmentation.  No tenderness.  Nonpitting edema present in the lower extremities.  Assessment/ Plan: 76 y.o. female   Bilateral leg edema - Plan: Compression stockings  Morbid obesity (HCC)  Generalized anxiety disorder with panic attacks - Plan: LORazepam (ATIVAN) 0.5 MG tablet  Compression stockings recommended.  No significant pitting edema on exam but she did have some postinflammatory hyperpigmentation within the skin.  No warmth, induration or weeping that would suggest any type of secondary bacterial infection at this point but given recurrence I do think that compression stockings would be beneficial.  She has plans for initiating some lifestyle modifications for morbid obesity as she understands that this does compound her lower extremity edema.  Unfortunately, advancing the Cymbalta made her symptoms worse.  Given her proximal atrial fibrillation I am hesitant to add anything that would prolong QTc.  We discussed options  including low-dose benzodiazepine for as needed use.  She was amenable to this and understands the risks associated with this medication including sedation, falls, respiratory depression, dementia and even death.  She will utilize this sparingly.  We will reconvene again in 6 weeks, sooner if concerns arise.  If she opts to continue this medication we will plan for controlled substance contract and urine drug screen at that visit.  Okay to taper off of Cymbalta since this has been essentially ineffective for the patient and unnecessary at this point.  She will take 30 mg every other day for the next 2 weeks and then discontinue  The Narcotic Database has been reviewed.  There were no red flags.     No orders of the defined types were placed in this encounter.  No orders of the defined types were placed in this encounter.    [name] Norlander, DO [place] [phone]

## 2022-06-09 NOTE — Progress Notes (Signed)
Patient Care Team: [name], DO as PCP - General (Family Medicine) Minus Breeding, MD as PCP - Cardiology (Cardiology) Thompson Grayer, MD as PCP - Electrophysiology (Cardiology) [name], MD as Consulting Physician (Hematology and Oncology) Ronald Lobo, MD as Consulting Physician (Gastroenterology) Donnie Mesa, MD as Consulting Physician (General Surgery) [facility] [name], MD as Attending Physician (Radiation Oncology) Lavera Guise, [facility] as Pharmacist (Family Medicine) [name] Evans Norva Riffle, LCSW as [place] [employer] (Licensed Clinical Social Worker) Allyn Kenner, MD (Dermatology)  DIAGNOSIS:  Encounter Diagnosis  Name Primary?   Carcinoma of upper-inner quadrant of left breast in female, estrogen receptor positive ([place])     SUMMARY OF ONCOLOGIC HISTORY: Oncology History  Carcinoma of upper-inner quadrant of left breast in female, estrogen receptor positive ([place])  12/20/2020 Initial Diagnosis   Screening mammogram detected a left breast asymmetry. Diagnostic mammogram and US showed a 0.7cm mass at the 10 o'clock position and normal left axillary lymph nodes. Biopsy showed invasive lobular carcinoma, grade 1-2, HER-2 negative (1+), ER+ 50%, PR+ 90%, Ki67 10%.   12/25/2020 Cancer Staging   Staging form: Breast, AJCC 8th Edition - Clinical: Stage IA (cT1b, cN0, cM0, G2, ER+, PR+, HER2-)   01/07/2021 Surgery   Left lumpectomy ([facility]) ([id number]): invasive and in situ lobular carcinoma, grade 2, 1.8cm, clear margins, and no evidence of carcinoma in 3 left axillary lymph nodes.   01/20/2021 Oncotype testing   The Oncotype DX score was 10 predicting a risk of outside the breast recurrence over the next 9 years of 3% if the patient's only systemic therapy is tamoxifen for 5 years.     02/18/2021 - 03/12/2021 Radiation Therapy   The patient initially received a dose of 42.56 Gy in 16 fractions to the breast using  whole-breast tangent fields. This was delivered using a 3-D conformal technique.   02/2021 - 02/2026 Anti-estrogen oral therapy   Anastrozole     CHIEF COMPLIANT: Follow-up of left breast cancer  on anastrozole  INTERVAL HISTORY: [name] Collier is a 76 y.o. with above-mentioned history of  left breast cancer having undergone lumpectomy. She presents to the clinic today for follow-up. She states that she is tolerating the anastrozole. She had some hot flashes but they are manageable. She complains of stiffness but it's not from the anastrozole.   ALLERGIES:  is allergic to augmentin [amoxicillin-pot clavulanate], codeine, and prednisone.  MEDICATIONS:  Current Outpatient Medications  Medication Sig Dispense Refill   acetaminophen (TYLENOL) 500 MG tablet Take 1,000 mg by mouth every 6 (six) hours as needed for moderate pain or headache.     amLODipine (NORVASC) 5 MG tablet Take 1.5 tablets (7.5 mg total) by mouth daily. 135 tablet 3   anastrozole (ARIMIDEX) 1 MG tablet Take 1 tablet (1 mg total) by mouth daily. 90 tablet 3   apixaban (ELIQUIS) 5 MG TABS tablet Take 1 tablet (5 mg total) by mouth 2 (two) times daily. 180 tablet 2   Cholecalciferol (VITAMIN D3) 5000 units CAPS Take 5,000 Units by mouth daily with supper.     dofetilide (TIKOSYN) 500 MCG capsule TAKE 1 CAPSULE BY MOUTH TWICE A DAY 180 capsule 2   ezetimibe (ZETIA) 10 MG tablet Take 1 tablet (10 mg total) by mouth daily. 90 tablet 3   famotidine (PEPCID) 20 MG tablet Take 1 tablet (20 mg total) by mouth 2 (two) times daily. 180 tablet 3   furosemide (LASIX) 20 MG tablet Take 1 tablet (20  mg total) by mouth daily. 90 tablet 3   LORazepam (ATIVAN) 0.5 MG tablet Take 0.5-1 tablets (0.25-0.5 mg total) by mouth daily as needed for anxiety. 20 tablet 1   olmesartan (BENICAR) 5 MG tablet Take 1 tablet (5 mg total) by mouth daily. 90 tablet 3   potassium chloride SA (KLOR-CON M) 20 MEQ tablet Take 1 tablet (20 mEq total) by mouth 2  (two) times daily. 180 tablet 3   No current facility-administered medications for this visit.    PHYSICAL EXAMINATION: ECOG PERFORMANCE STATUS: 1 - Symptomatic but completely ambulatory  Vitals:   06/22/22 1151  BP: (!) 148/79  Pulse: 73  Resp: 18  Temp: 97.7 F (36.5 C)  SpO2: 95%   Filed Weights   06/22/22 1151  Weight: 278 lb 6.4 oz (126.3 kg)    BREAST: No palpable masses or nodules in either right or left breasts. No palpable axillary supraclavicular or infraclavicular adenopathy no breast tenderness or nipple discharge. (exam performed in the presence of a chaperone)  LABORATORY DATA:  I have reviewed the data as listed    Latest Ref Rng & Units 04/24/2022    9:17 AM 12/30/2021    2:05 PM 12/02/2021    9:43 AM  CMP  Glucose 70 - 99 mg/dL 113  107  105   BUN 8 - 27 mg/dL 14  9  15    Creatinine 0.57 - 1.00 mg/dL 0.74  0.91  0.78   Sodium 134 - 144 mmol/L 142  142  143   Potassium 3.5 - 5.2 mmol/L 4.1  4.8  4.6   Chloride 96 - 106 mmol/L 105  104  106   CO2 20 - 29 mmol/L 23  29  24    Calcium 8.7 - 10.3 mg/dL 9.3  9.8  9.3   Total Protein 6.0 - 8.5 g/dL 6.7   7.0   Total Bilirubin 0.0 - 1.2 mg/dL 0.7   0.8   Alkaline Phos 44 - 121 IU/L 93   84   AST 0 - 40 IU/L 18   22   ALT 0 - 32 IU/L 20   19     Lab Results  Component Value Date   WBC 5.1 12/02/2021   HGB 12.9 12/02/2021   HCT 39.5 12/02/2021   MCV 89 12/02/2021   PLT 254 12/02/2021   NEUTROABS 3.2 07/04/2021    ASSESSMENT & PLAN:  Carcinoma of upper-inner quadrant of left breast in female, estrogen receptor positive ([place]) 12/20/2020: Screening mammogram detected left breast asymmetry.  Ultrasound revealed 0.7 cm mass at 10 o'clock position, axilla negative, biopsy revealed grade 1-2 invasive lobular cancer, ER 50%, PR 90%, Ki-67 10%, HER2 negative by IHC 1+ T1BN0 stage Ia clinical stage   01/07/2021: Left lumpectomy ([facility]): invasive and in situ lobular carcinoma, grade 2, 1.8cm, clear margins, and no  evidence of carcinoma in 3 left axillary lymph nodes.   Oncotype Dx 10: ROR 3%   Plan: 1. Adj RT completed 03/12/2021 2. Foll by Adj Anti-estrogen therapy started March 2022   Anastrozole toxicities: Denies any adverse effects to anastrozole.  Very occasional hot flashes. Chronic osteoarthritis of bilateral knees: Making it difficult for her to exercise.   Breast cancer surveillance: 1.  Breast exam 06/22/2022: Benign 2. mammogram 11/05/2021: Benign breast density category C   Return to clinic in 1 year for follow-up    No orders of the defined types were placed in this encounter.  The patient has a good understanding  of the overall plan. she agrees with it. she will call with any problems that may develop before the next visit here. Total time spent: 30 mins including face to face time and time spent for planning, charting and co-ordination of care   Harriette Ohara, MD 06/22/22    I Gardiner Coins am scribing for Dr. Lindi Adie  I have reviewed the above documentation for accuracy and completeness, and I agree with the above.

## 2022-06-11 DIAGNOSIS — M9903 Segmental and somatic dysfunction of lumbar region: Secondary | ICD-10-CM | POA: Diagnosis not present

## 2022-06-11 DIAGNOSIS — S338XXA Sprain of other parts of lumbar spine and pelvis, initial encounter: Secondary | ICD-10-CM | POA: Diagnosis not present

## 2022-06-11 DIAGNOSIS — M9902 Segmental and somatic dysfunction of thoracic region: Secondary | ICD-10-CM | POA: Diagnosis not present

## 2022-06-11 DIAGNOSIS — S233XXA Sprain of ligaments of thoracic spine, initial encounter: Secondary | ICD-10-CM | POA: Diagnosis not present

## 2022-06-11 DIAGNOSIS — M9901 Segmental and somatic dysfunction of cervical region: Secondary | ICD-10-CM | POA: Diagnosis not present

## 2022-06-11 DIAGNOSIS — M546 Pain in thoracic spine: Secondary | ICD-10-CM | POA: Diagnosis not present

## 2022-06-11 DIAGNOSIS — S134XXA Sprain of ligaments of cervical spine, initial encounter: Secondary | ICD-10-CM | POA: Diagnosis not present

## 2022-06-18 DIAGNOSIS — M9901 Segmental and somatic dysfunction of cervical region: Secondary | ICD-10-CM | POA: Diagnosis not present

## 2022-06-18 DIAGNOSIS — S233XXA Sprain of ligaments of thoracic spine, initial encounter: Secondary | ICD-10-CM | POA: Diagnosis not present

## 2022-06-18 DIAGNOSIS — S134XXA Sprain of ligaments of cervical spine, initial encounter: Secondary | ICD-10-CM | POA: Diagnosis not present

## 2022-06-18 DIAGNOSIS — M9903 Segmental and somatic dysfunction of lumbar region: Secondary | ICD-10-CM | POA: Diagnosis not present

## 2022-06-18 DIAGNOSIS — S338XXA Sprain of other parts of lumbar spine and pelvis, initial encounter: Secondary | ICD-10-CM | POA: Diagnosis not present

## 2022-06-18 DIAGNOSIS — M9902 Segmental and somatic dysfunction of thoracic region: Secondary | ICD-10-CM | POA: Diagnosis not present

## 2022-06-18 DIAGNOSIS — M546 Pain in thoracic spine: Secondary | ICD-10-CM | POA: Diagnosis not present

## 2022-06-22 ENCOUNTER — Inpatient Hospital Stay: Payer: PPO | Attending: Hematology and Oncology | Admitting: Hematology and Oncology

## 2022-06-22 ENCOUNTER — Other Ambulatory Visit: Payer: Self-pay

## 2022-06-22 DIAGNOSIS — Z79811 Long term (current) use of aromatase inhibitors: Secondary | ICD-10-CM | POA: Insufficient documentation

## 2022-06-22 DIAGNOSIS — Z923 Personal history of irradiation: Secondary | ICD-10-CM | POA: Diagnosis not present

## 2022-06-22 DIAGNOSIS — C50212 Malignant neoplasm of upper-inner quadrant of left female breast: Secondary | ICD-10-CM | POA: Diagnosis not present

## 2022-06-22 DIAGNOSIS — Z170 Estrogen receptor positive status [ER+]: Secondary | ICD-10-CM | POA: Diagnosis not present

## 2022-06-22 MED ORDER — ANASTROZOLE 1 MG PO TABS: 1.0000 mg | ORAL_TABLET | Freq: Every day | ORAL | 3 refills | Status: DC

## 2022-06-22 NOTE — Assessment & Plan Note (Signed)
12/20/2020: Screening mammogram detected left breast asymmetry. Ultrasound revealed 0.7 cm mass at 10 o'clock position, axilla negative, biopsy revealed grade 1-2 invasive lobular cancer, ER 50%, PR 90%, Ki-67 10%, HER2 negative by IHC 1+ T1BN0 stage Ia clinical stage  01/07/2021:Left lumpectomy ([facility]): invasive and in situ lobular carcinoma, grade 2, 1.8cm, clear margins, and no evidence of carcinoma in 3 left axillary lymph nodes.  Oncotype Dx 10: ROR 3%  Plan: 1. Adj RT completed 03/12/2021 2. Foll by Adj Anti-estrogen therapy started March 2022  Anastrozole toxicities: Denies any adverse effects to anastrozole.  Very occasional hot flashes. Chronic osteoarthritis of bilateral knees: Making it difficult for her to exercise.  Breast cancer surveillance: 1.  Breast exam 06/22/2022: Benign 2. mammogram 11/05/2021: Benign breast density category C  Return to clinic in 1 year for follow-up

## 2022-06-25 DIAGNOSIS — S134XXA Sprain of ligaments of cervical spine, initial encounter: Secondary | ICD-10-CM | POA: Diagnosis not present

## 2022-06-25 DIAGNOSIS — S233XXA Sprain of ligaments of thoracic spine, initial encounter: Secondary | ICD-10-CM | POA: Diagnosis not present

## 2022-06-25 DIAGNOSIS — M9903 Segmental and somatic dysfunction of lumbar region: Secondary | ICD-10-CM | POA: Diagnosis not present

## 2022-06-25 DIAGNOSIS — M546 Pain in thoracic spine: Secondary | ICD-10-CM | POA: Diagnosis not present

## 2022-06-25 DIAGNOSIS — S338XXA Sprain of other parts of lumbar spine and pelvis, initial encounter: Secondary | ICD-10-CM | POA: Diagnosis not present

## 2022-06-25 DIAGNOSIS — M9901 Segmental and somatic dysfunction of cervical region: Secondary | ICD-10-CM | POA: Diagnosis not present

## 2022-06-25 DIAGNOSIS — M9902 Segmental and somatic dysfunction of thoracic region: Secondary | ICD-10-CM | POA: Diagnosis not present

## 2022-06-30 ENCOUNTER — Encounter (HOSPITAL_COMMUNITY): Payer: Self-pay | Admitting: Nurse Practitioner

## 2022-06-30 ENCOUNTER — Ambulatory Visit (HOSPITAL_COMMUNITY)
Admission: RE | Admit: 2022-06-30 | Discharge: 2022-06-30 | Disposition: A | Discharge status: Home / Self Care | Payer: PPO | Source: Ambulatory Visit | Attending: Nurse Practitioner | Admitting: Nurse Practitioner

## 2022-06-30 VITALS — BP 130/88 | HR 63 | Ht 65.0 in | Wt 275.0 lb

## 2022-06-30 DIAGNOSIS — I4819 Other persistent atrial fibrillation: Secondary | ICD-10-CM | POA: Insufficient documentation

## 2022-06-30 DIAGNOSIS — Z7901 Long term (current) use of anticoagulants: Secondary | ICD-10-CM | POA: Insufficient documentation

## 2022-06-30 DIAGNOSIS — Z853 Personal history of malignant neoplasm of breast: Secondary | ICD-10-CM | POA: Insufficient documentation

## 2022-06-30 DIAGNOSIS — I480 Paroxysmal atrial fibrillation: Secondary | ICD-10-CM

## 2022-06-30 DIAGNOSIS — Z923 Personal history of irradiation: Secondary | ICD-10-CM | POA: Diagnosis not present

## 2022-06-30 DIAGNOSIS — Z8673 Personal history of transient ischemic attack (TIA), and cerebral infarction without residual deficits: Secondary | ICD-10-CM | POA: Insufficient documentation

## 2022-06-30 DIAGNOSIS — L03119 Cellulitis of unspecified part of limb: Secondary | ICD-10-CM | POA: Insufficient documentation

## 2022-06-30 DIAGNOSIS — D6869 Other thrombophilia: Secondary | ICD-10-CM | POA: Diagnosis not present

## 2022-06-30 DIAGNOSIS — Z79899 Other long term (current) drug therapy: Secondary | ICD-10-CM | POA: Insufficient documentation

## 2022-06-30 DIAGNOSIS — Z79811 Long term (current) use of aromatase inhibitors: Secondary | ICD-10-CM | POA: Diagnosis not present

## 2022-06-30 DIAGNOSIS — I10 Essential (primary) hypertension: Secondary | ICD-10-CM | POA: Insufficient documentation

## 2022-06-30 DIAGNOSIS — E785 Hyperlipidemia, unspecified: Secondary | ICD-10-CM | POA: Insufficient documentation

## 2022-06-30 LAB — BASIC METABOLIC PANEL
Anion gap: 10 (ref 5–15)
BUN: 11 mg/dL (ref 8–23)
CO2: 26 mmol/L (ref 22–32)
Calcium: 9.4 mg/dL (ref 8.9–10.3)
Chloride: 105 mmol/L (ref 98–111)
Creatinine, Ser: 0.82 mg/dL (ref 0.44–1.00)
GFR, Estimated: >60 mL/min (ref >60)
Glucose, Bld: 109 mg/dL — ABNORMAL HIGH (ref 70–99)
Potassium: 4.2 mmol/L (ref 3.5–5.1)
Sodium: 141 mmol/L (ref 135–145)

## 2022-06-30 LAB — MAGNESIUM: Magnesium: 2.2 mg/dL (ref 1.7–2.4)

## 2022-06-30 NOTE — Progress Notes (Signed)
Primary Care Physician: [name], DO Referring Physician: Dr. Radford Pax Cardiologist: Dr. Percival Spanish EP: Dr. [name] Hearing is a 76 y.o. female with a h/o afib, HTN, hyperlipidemia and TIA. She is in the afib clinic for f/u of Tikosyn use. She feels well with NSR today. She did have a mechanical fall a couple of weeks ago and was placed on metoprolol 25 mg bid for afib secondary to the fall. . She did convert in a couple of days and then stopped BB with issues with hypotension and bradycardia. Now however, her BP has been running higher, around 093-267 systolic.  Continues on xarelto 20 mg daily with a CHA2DS2VASc of at least 5, without any signs of bleeding.   F/u with afib clinic 06/19/21. She is staying in [place] with Tikosyn. She has had an lumpectomy and radiation. Is finished with treatment now and is on daily. Arimidex. She is pending a colonoscopy and has received instruction re holding her [place]. Reminded to take Tikosyn with a sip of water.    F/u in afib clinic, 07/22/21,  after a hemo peritoneal bleed around her spleen following a colonoscopy. She had minimal cbc drop and was observed x 2 days in the hospital. It did resolve without any intervention. She was switched form xarelto to eliquis on d/c.   F/u in afib clinic, 12/30/21 for ongoing  Tikosyn use. She is continuing  in [place]. No issues with anticoagulation. States that she has has had some recent stomach upset with diarrhea. Will check labs today.   F/u 06/30/22 for tikosyn surveillance. Ekg shows SR. No afib to report. Has had some issues with L more so than Rt LE cellulitis. Has recently had her 3rd round of antibiotics. Started with an injury to LL leg. Recently fitted with support hose by PCP.   Today, she denies symptoms of palpitations, chest pain, shortness of breath, orthopnea, PND, lower extremity edema, dizziness, presyncope, syncope, or neurologic sequela. The patient is tolerating medications without difficulties  and is otherwise without complaint today.   Past Medical History:  Diagnosis Date   Anxiety    Atrial fibrillation ([place])    Cancer ([place]) 11/2020   left breast IMC   GERD (gastroesophageal reflux disease)    HTN (hypertension)    Hyperlipidemia    Personal history of radiation therapy    TIA (transient ischemic attack)    Past Surgical History:  Procedure Laterality Date   BOTOX INJECTION N/A 12/22/2018   Procedure: BOTOX INJECTION INTO ANAL SPHINCTER;  Surgeon: Ileana Roup, MD;  Location: [facility];  Service: General;  Laterality: N/A;   BREAST BIOPSY Left 11/26/2020   BREAST LUMPECTOMY WITH RADIOACTIVE SEED AND SENTINEL LYMPH NODE BIOPSY Left 01/07/2021   Procedure: LEFT BREAST LUMPECTOMY WITH RADIOACTIVE SEED AND LEFT AXILLARY SENTINEL LYMPH NODE BIOPSY WITH BLUE DYE INJECTION;  Surgeon: Donnie Mesa, MD;  Location: [place];  Service: General;  Laterality: Left;  LMA WITH PECTORAL BLOCK, BLUE DYE INJECTION   CARDIOVERSION N/A 01/04/2018   Procedure: CARDIOVERSION;  Surgeon: Dorothy Spark, MD;  Location[facility];  Service: Cardiovascular;  Laterality: N/A;   CARDIOVERSION N/A 09/20/2018   Procedure: CARDIOVERSION;  Surgeon: Sueanne Margarita, MD;  Location: [facility] ENDOSCOPY;  Service: Cardiovascular;  Laterality: N/A;   CARDIOVERSION N/A 10/27/2018   Procedure: CARDIOVERSION;  Surgeon: Buford Dresser, MD;  Location: [place];  Service: Cardiovascular;  Laterality: N/A;   KNEE SURGERY Bilateral    Torn miniscus  RECTAL EXAM UNDER ANESTHESIA N/A 12/22/2018   Procedure: ANORECTAL  EXAM UNDER ANESTHESIA;  Surgeon: Ileana Roup, MD;  Location: [facility];  Service: General;  Laterality: N/A;   RECTOCELE REPAIR     TONSILLECTOMY     VAGINAL HYSTERECTOMY      Current Outpatient Medications  Medication Sig Dispense Refill   acetaminophen (TYLENOL) 500 MG tablet Take 1,000 mg by mouth every 6 (six) hours as needed for moderate pain or headache.      amLODipine (NORVASC) 5 MG tablet Take 1.5 tablets (7.5 mg total) by mouth daily. 135 tablet 3   anastrozole (ARIMIDEX) 1 MG tablet Take 1 tablet (1 mg total) by mouth daily. 90 tablet 3   apixaban (ELIQUIS) 5 MG TABS tablet Take 1 tablet (5 mg total) by mouth 2 (two) times daily. 180 tablet 2   Cholecalciferol (VITAMIN D3) 5000 units CAPS Take 5,000 Units by mouth daily with supper.     dofetilide (TIKOSYN) 500 MCG capsule TAKE 1 CAPSULE BY MOUTH TWICE A DAY 180 capsule 2   ezetimibe (ZETIA) 10 MG tablet Take 1 tablet (10 mg total) by mouth daily. 90 tablet 3   famotidine (PEPCID) 20 MG tablet Take 1 tablet (20 mg total) by mouth 2 (two) times daily. 180 tablet 3   furosemide (LASIX) 20 MG tablet Take 1 tablet (20 mg total) by mouth daily. 90 tablet 3   LORazepam (ATIVAN) 0.5 MG tablet Take 0.5-1 tablets (0.25-0.5 mg total) by mouth daily as needed for anxiety. 20 tablet 1   olmesartan (BENICAR) 5 MG tablet Take 1 tablet (5 mg total) by mouth daily. 90 tablet 3   potassium chloride SA (KLOR-CON M) 20 MEQ tablet Take 1 tablet (20 mEq total) by mouth 2 (two) times daily. 180 tablet 3   DULoxetine HCl 30 MG CSDR Take 1 tablet by mouth daily.     No current facility-administered medications for this encounter.    Allergies  Allergen Reactions   Augmentin [Amoxicillin-Pot Clavulanate] Other (See Comments)    c-diff Has patient had a PCN reaction causing immediate rash, facial/tongue/throat swelling, SOB or lightheadedness with hypotension: No Has patient had a PCN reaction causing severe rash involving mucus membranes or skin necrosis: No Has patient had a PCN reaction that required hospitalization: No Has patient had a PCN reaction occurring within the last 10 years: Yes If all of the above answers are "NO", then may proceed with Cephalosporin use.    Codeine Palpitations and Rash   Prednisone Other (See Comments)    Jittery, red in the face    Social History   Socioeconomic History    Marital status: Widowed    Spouse name: Not on file   Number of children: 2   Years of education: 4   Highest education level: Not on file  Occupational History   Occupation: Retired    Comment: Personnel officer  Tobacco Use   Smoking status: Never   Smokeless tobacco: Never  Scientific laboratory technician Use: Never used  Substance and Sexual Activity   Alcohol use: No    Alcohol/week: 0.0 standard drinks of alcohol   Drug use: No   Sexual activity: Not Currently    Birth control/protection: Post-menopausal  Other Topics Concern   Not on file  Social History Narrative   Lives alone - children in [place] -    Caffeine use: Soda/tea daily   Good church support group   Social Determinants of Health  Financial Resource Strain: Low Risk  (04/30/2022)   Overall Financial Resource Strain (CARDIA)    Difficulty of Paying Living Expenses: Not very hard  Food Insecurity: No Food Insecurity (04/30/2022)   Hunger Vital Sign    Worried About Running Out of Food in the Last Year: Never true    Ran Out of Food in the Last Year: Never true  Transportation Needs: No Transportation Needs (04/30/2022)   PRAPARE - Hydrologist (Medical): No    Lack of Transportation (Non-Medical): No  Physical Activity: Inactive (04/30/2022)   Exercise Vital Sign    Days of Exercise per Week: 0 days    Minutes of Exercise per Session: 0 min  Stress: Stress Concern Present (04/30/2022)   [place]    Feeling of Stress : To some extent  Social Connections: Moderately Integrated (04/30/2022)   Social Connection and Isolation Panel [NHANES]    Frequency of Communication with Friends and Family: More than three times a week    Frequency of Social Gatherings with Friends and Family: More than three times a week    Attends Religious Services: More than 4 times per year    Active Member of [employer] or Organizations:  Yes    Attends Archivist Meetings: More than 4 times per year    Marital Status: Widowed  Intimate Partner Violence: Not At Risk (04/30/2022)   Humiliation, Afraid, Rape, and Kick questionnaire    Fear of Current or Ex-Partner: No    Emotionally Abused: No    Physically Abused: No    Sexually Abused: No    Family History  Problem Relation Age of Onset   Hypertension Mother    Hyperlipidemia Mother    Neurologic Disorder Mother 52       GB   Stroke Son    Stroke Maternal Uncle    Stroke Grandchild    Prostate cancer Brother     ROS- All systems are reviewed and negative except as per the HPI above  Physical Exam: There were no vitals filed for this visit.  Wt Readings from Last 3 Encounters:  06/22/22 126.3 kg  06/05/22 125.9 kg  05/28/22 126.1 kg    Labs: Lab Results  Component Value Date   NA 142 04/24/2022   K 4.1 04/24/2022   CL 105 04/24/2022   CO2 23 04/24/2022   GLUCOSE 113 (H) 04/24/2022   BUN 14 04/24/2022   CREATININE 0.74 04/24/2022   CALCIUM 9.3 04/24/2022   PHOS 4.1 06/30/2021   MG 1.9 12/30/2021   Lab Results  Component Value Date   INR 1.7 (H) 06/29/2021   Lab Results  Component Value Date   CHOL 173 04/24/2022   HDL 56 04/24/2022   [place] 98 04/24/2022   TRIG 103 04/24/2022     GEN- The patient is well appearing, alert and oriented x 3 today.   Head- normocephalic, atraumatic Eyes-  Sclera clear, conjunctiva pink Ears- hearing intact Oropharynx- clear Neck- supple, no JVP Lymph- no cervical lymphadenopathy Lungs- Clear to ausculation bilaterally, normal work of breathing Heart- regular rate and rhythm, no murmurs, rubs or gallops, PMI not laterally displaced GI- soft, NT, ND, + BS Extremities- no clubbing, cyanosis, trace LLE, mild redness noted of shin areas, left > than right  MS- no significant deformity or atrophy Skin- no rash or lesion Psych- euthymic mood, full affect Neuro- strength and sensation are  intact  EKG Vent.  rate 63 BPM PR interval 208 ms QRS duration 124 ms QT/QTcB 446/456 ms P-R-T axes 68 -53 38 Normal sinus rhythm Left axis deviation Right bundle branch block Possible Lateral infarct , age undetermined Inferior infarct , age undetermined Abnormal ECG When compared with ECG of 30-Dec-2021 13:53, PREVIOUS ECG IS PRESENT  - Left ventricle: The cavity size was normal. There was mild   concentric hypertrophy. Systolic function was normal. The   estimated ejection fraction was in the range of 60% to 65%. Wall   motion was normal; there were no regional wall motion   abnormalities. - Aortic valve: Transvalvular velocity was within the normal range.   There was no stenosis. There was no regurgitation. - Aorta: Ascending aortic diameter: 38 mm (S). - Ascending aorta: The ascending aorta was mildly dilated. - Mitral valve: Mildly calcified annulus. Transvalvular velocity   was within the normal range. There was no evidence for stenosis.   There was mild regurgitation. - Left atrium: The atrium was mildly dilated.LAD 57 mm, volume 78 ml - Right ventricle: The cavity size was normal. Wall thickness was   normal. Systolic function was normal. - Atrial septum: No defect or patent foramen ovale was identified. - Tricuspid valve: There was mild-moderate regurgitation. - Pulmonic valve: There was moderate regurgitation. - Pulmonary arteries: Systolic pressure was severely increased. PA   peak pressure: 69 mm Hg (S).     Assessment and Plan: 1. Persistent afib Maintaining  SR on Tikosyn 500 mcg bid qtc stable Continue eliquis 5 mg bid for CHA2DS2VASc of at least 5 Bmet/mag today   2. HTN Stable    3. LE cellulitis  Avoid salt Support hose ordered by PCP  Elevate legs when sitting     F/u with afib clinic in 6 months   Butch Penny C. Shernell Saldierna, [facility] [address] [place], [place] [address] [phone]

## 2022-07-02 DIAGNOSIS — S134XXA Sprain of ligaments of cervical spine, initial encounter: Secondary | ICD-10-CM | POA: Diagnosis not present

## 2022-07-02 DIAGNOSIS — M9902 Segmental and somatic dysfunction of thoracic region: Secondary | ICD-10-CM | POA: Diagnosis not present

## 2022-07-02 DIAGNOSIS — M546 Pain in thoracic spine: Secondary | ICD-10-CM | POA: Diagnosis not present

## 2022-07-02 DIAGNOSIS — S233XXA Sprain of ligaments of thoracic spine, initial encounter: Secondary | ICD-10-CM | POA: Diagnosis not present

## 2022-07-02 DIAGNOSIS — M9901 Segmental and somatic dysfunction of cervical region: Secondary | ICD-10-CM | POA: Diagnosis not present

## 2022-07-02 DIAGNOSIS — S338XXA Sprain of other parts of lumbar spine and pelvis, initial encounter: Secondary | ICD-10-CM | POA: Diagnosis not present

## 2022-07-02 DIAGNOSIS — M9903 Segmental and somatic dysfunction of lumbar region: Secondary | ICD-10-CM | POA: Diagnosis not present

## 2022-07-15 ENCOUNTER — Encounter (INDEPENDENT_AMBULATORY_CARE_PROVIDER_SITE_OTHER): Payer: Self-pay

## 2022-07-20 DIAGNOSIS — M9901 Segmental and somatic dysfunction of cervical region: Secondary | ICD-10-CM | POA: Diagnosis not present

## 2022-07-20 DIAGNOSIS — S338XXA Sprain of other parts of lumbar spine and pelvis, initial encounter: Secondary | ICD-10-CM | POA: Diagnosis not present

## 2022-07-20 DIAGNOSIS — S134XXA Sprain of ligaments of cervical spine, initial encounter: Secondary | ICD-10-CM | POA: Diagnosis not present

## 2022-07-20 DIAGNOSIS — M546 Pain in thoracic spine: Secondary | ICD-10-CM | POA: Diagnosis not present

## 2022-07-20 DIAGNOSIS — S233XXA Sprain of ligaments of thoracic spine, initial encounter: Secondary | ICD-10-CM | POA: Diagnosis not present

## 2022-07-20 DIAGNOSIS — M9902 Segmental and somatic dysfunction of thoracic region: Secondary | ICD-10-CM | POA: Diagnosis not present

## 2022-07-20 DIAGNOSIS — M9903 Segmental and somatic dysfunction of lumbar region: Secondary | ICD-10-CM | POA: Diagnosis not present

## 2022-07-21 ENCOUNTER — Ambulatory Visit (INDEPENDENT_AMBULATORY_CARE_PROVIDER_SITE_OTHER): Payer: PPO | Admitting: Family Medicine

## 2022-07-21 ENCOUNTER — Encounter: Payer: Self-pay | Admitting: Family Medicine

## 2022-07-21 VITALS — BP 129/67 | HR 69 | Temp 97.2°F | Ht 65.0 in | Wt 276.6 lb

## 2022-07-21 DIAGNOSIS — F410 Panic disorder [episodic paroxysmal anxiety] without agoraphobia: Secondary | ICD-10-CM

## 2022-07-21 DIAGNOSIS — F411 Generalized anxiety disorder: Secondary | ICD-10-CM

## 2022-07-21 DIAGNOSIS — Z79899 Other long term (current) drug therapy: Secondary | ICD-10-CM | POA: Diagnosis not present

## 2022-07-21 MED ORDER — LORAZEPAM 0.5 MG PO TABS: 0.2500 mg | ORAL_TABLET | Freq: Every day | ORAL | 2 refills | Status: DC | PRN

## 2022-07-21 NOTE — Progress Notes (Signed)
Subjective: CC: Follow-up anxiety disorder PCP: [name], DO WUJ:WJXBJ JEIMY BICKERT is a 76 y.o. female presenting to clinic today for:  1.  Generalized anxiety disorder with panic At her last visit we tapered Mrs. Rhodus off of her Cymbalta as it seemed to be exacerbating some A-fib issues.  She notes that she has done well off of Cymbalta and continues to use the Ativan extremely sparingly.  She is only using 0.25 mg when she does use it and this is only been a handful of times since her last visit.  No excessive daytime sedation, falls, respiratory depression, visual or auditory hallucinations or any impairment in memory.  She does not drink.  She does not drive with the medication.   ROS: Per HPI  Allergies  Allergen Reactions   Augmentin [Amoxicillin-Pot Clavulanate] Other (See Comments)    c-diff Has patient had a PCN reaction causing immediate rash, facial/tongue/throat swelling, SOB or lightheadedness with hypotension: No Has patient had a PCN reaction causing severe rash involving mucus membranes or skin necrosis: No Has patient had a PCN reaction that required hospitalization: No Has patient had a PCN reaction occurring within the last 10 years: Yes If all of the above answers are "NO", then may proceed with Cephalosporin use.    Codeine Palpitations and Rash   Prednisone Other (See Comments)    Jittery, red in the face   Past Medical History:  Diagnosis Date   Anxiety    Atrial fibrillation ([place])    Cancer ([place]) 11/2020   left breast IMC   GERD (gastroesophageal reflux disease)    HTN (hypertension)    Hyperlipidemia    Personal history of radiation therapy    TIA (transient ischemic attack)     Current Outpatient Medications:    acetaminophen (TYLENOL) 500 MG tablet, Take 1,000 mg by mouth every 6 (six) hours as needed for moderate pain or headache., Disp: , Rfl:    amLODipine (NORVASC) 5 MG tablet, Take 1.5 tablets (7.5 mg total) by mouth daily., Disp:  135 tablet, Rfl: 3   anastrozole (ARIMIDEX) 1 MG tablet, Take 1 tablet (1 mg total) by mouth daily., Disp: 90 tablet, Rfl: 3   apixaban (ELIQUIS) 5 MG TABS tablet, Take 1 tablet (5 mg total) by mouth 2 (two) times daily., Disp: 180 tablet, Rfl: 2   Cholecalciferol (VITAMIN D3) 5000 units CAPS, Take 5,000 Units by mouth daily with supper., Disp: , Rfl:    dofetilide (TIKOSYN) 500 MCG capsule, TAKE 1 CAPSULE BY MOUTH TWICE A DAY, Disp: 180 capsule, Rfl: 2   DULoxetine HCl 30 MG CSDR, Take 1 tablet by mouth daily., Disp: , Rfl:    ezetimibe (ZETIA) 10 MG tablet, Take 1 tablet (10 mg total) by mouth daily., Disp: 90 tablet, Rfl: 3   famotidine (PEPCID) 20 MG tablet, Take 1 tablet (20 mg total) by mouth 2 (two) times daily., Disp: 180 tablet, Rfl: 3   furosemide (LASIX) 20 MG tablet, Take 1 tablet (20 mg total) by mouth daily., Disp: 90 tablet, Rfl: 3   LORazepam (ATIVAN) 0.5 MG tablet, Take 0.5-1 tablets (0.25-0.5 mg total) by mouth daily as needed for anxiety., Disp: 20 tablet, Rfl: 1   olmesartan (BENICAR) 5 MG tablet, Take 1 tablet (5 mg total) by mouth daily., Disp: 90 tablet, Rfl: 3   potassium chloride SA (KLOR-CON M) 20 MEQ tablet, Take 1 tablet (20 mEq total) by mouth 2 (two) times daily., Disp: 180 tablet, Rfl: 3 Social History  Socioeconomic History   Marital status: Widowed    Spouse name: Not on file   Number of children: 2   Years of education: 74   Highest education level: Not on file  Occupational History   Occupation: Retired    Comment: Personnel officer  Tobacco Use   Smoking status: Never   Smokeless tobacco: Never   Tobacco comments:    Never smoke 06/30/22  Vaping Use   Vaping Use: Never used  Substance and Sexual Activity   Alcohol use: No    Alcohol/week: 0.0 standard drinks of alcohol   Drug use: No   Sexual activity: Not Currently    Birth control/protection: Post-menopausal  Other Topics Concern   Not on file  Social History Narrative   Lives alone -  children in [place] -    Caffeine use: Soda/tea daily   Good church support group   Social Determinants of Health   Financial Resource Strain: Low Risk  (04/30/2022)   Overall Financial Resource Strain (CARDIA)    Difficulty of Paying Living Expenses: Not very hard  Food Insecurity: No Food Insecurity (04/30/2022)   Hunger Vital Sign    Worried About Running Out of Food in the Last Year: Never true    [place] in the Last Year: Never true  Transportation Needs: No Transportation Needs (04/30/2022)   PRAPARE - Hydrologist (Medical): No    Lack of Transportation (Non-Medical): No  Physical Activity: Inactive (04/30/2022)   Exercise Vital Sign    Days of Exercise per Week: 0 days    Minutes of Exercise per Session: 0 min  Stress: Stress Concern Present (04/30/2022)   [place]    Feeling of Stress : To some extent  Social Connections: Moderately Integrated (04/30/2022)   Social Connection and Isolation Panel [NHANES]    Frequency of Communication with Friends and Family: More than three times a week    Frequency of Social Gatherings with Friends and Family: More than three times a week    Attends Religious Services: More than 4 times per year    Active Member of [employer] or Organizations: Yes    Attends Archivist Meetings: More than 4 times per year    Marital Status: Widowed  Intimate Partner Violence: Not At Risk (04/30/2022)   Humiliation, Afraid, Rape, and Kick questionnaire    Fear of Current or Ex-Partner: No    Emotionally Abused: No    Physically Abused: No    Sexually Abused: No   Family History  Problem Relation Age of Onset   Hypertension Mother    Hyperlipidemia Mother    Neurologic Disorder Mother 22       GB   Stroke Son    Stroke Maternal Uncle    Stroke Grandchild    Prostate cancer Brother     Objective: Office vital signs  reviewed. BP (!) 149/78   Pulse 69   Temp (!) 97.2 F (36.2 C)   Ht '5\' 5"'$  (1.651 m)   Wt 276 lb 9.6 oz (125.5 kg)   SpO2 92%   BMI 46.03 kg/m   Physical Examination:  General: Awake, alert, morbidly obese, No acute distress HEENT: Sclera white Cardio: irregularly irregular with rate control, S1S2 heard, no murmurs appreciated Pulm: clear to auscultation bilaterally, no wheezes, rhonchi or rales; normal work of breathing on room air MSK: Ambulating independently  Neuro: no tremor Psych:  Mood, speech normal, affect appropriate.  Very pleasant and interactive female     07/21/2022   10:55 AM 06/05/2022    2:26 PM 05/28/2022    9:18 AM  Depression screen PHQ 2/9  Decreased Interest '1 1 1  '$ Down, Depressed, Hopeless '1 1 1  '$ PHQ - 2 Score '2 2 2  '$ Altered sleeping '1 1 1  '$ Tired, decreased energy '1 1 1  '$ Change in appetite '1 1 2  '$ Feeling bad or failure about yourself  1 0 0  Trouble concentrating '1 1 1  '$ Moving slowly or fidgety/restless 0 0 0  Suicidal thoughts 0 0 0  PHQ-9 Score '7 6 7  '$ Difficult doing work/chores Somewhat difficult Not difficult at all Not difficult at all      07/21/2022   10:55 AM 06/05/2022    2:27 PM 05/28/2022    9:19 AM 04/28/2022    4:11 PM  GAD 7 : Generalized Anxiety Score  Nervous, Anxious, on Edge '1 1 1 1  '$ Control/stop worrying '1 1 1 1  '$ Worry too much - different things '1 1 1 2  '$ Trouble relaxing '1 1 1 1  '$ Restless '1 1 1 1  '$ Easily annoyed or irritable 0 0 0 0  Afraid - awful might happen '1 1 1 1  '$ Total GAD 7 Score '6 6 6 7  '$ Anxiety Difficulty Somewhat difficult Somewhat difficult Not difficult at all Somewhat difficult   Assessment/ Plan: 76 y.o. female   Generalized anxiety disorder with panic attacks - Plan: LORazepam (ATIVAN) 0.5 MG tablet, ToxASSURE [place] 13 (MW), Urine  Controlled substance agreement signed - Plan: ToxASSURE Select 13 (MW), Urine  Morbid obesity ([place])  Ativan renewed.  National narcotic database reviewed and there were no  red flags.  She still has almost an entire bottle left as well as 1 renewal on that particular prescription.  I have placed a new prescription on file should she need it within this next 6 months.  Given her sparing use I will be surprised if she needs another refill.  UDS and CSC were updated as per office policy today  Considering GLP for treatment of obesity.  She understands that her insurance does not cover this at this time and this would be an out-of-pocket expense.  We will ensure that this is okay to use with her current meds for atrial fibrillation  No orders of the defined types were placed in this encounter.  No orders of the defined types were placed in this encounter.    [name], DO [place] [phone]

## 2022-07-21 NOTE — Patient Instructions (Signed)
Controlled Substance Guidelines:  1. You cannot get an early refill, even it is lost.  2. You cannot get controlled medications from any other doctor, unless it is the emergency department and related to a new problem or injury.  3. You cannot use alcohol, marijuana, cocaine or any other recreational drugs while using this medication. This is very dangerous.  4. You are willing to have your urine drug tested at each visit.  5. You will not drive while using this medication, because that can put yourself and others in serious danger of an accident. 6. If any medication is stolen, then there must be a police report to verify it, or it cannot be refilled.  7. I will not prescribe these medications for longer than 6 months.  8. You must bring your pill bottle to each visit.  9. You must use the same pharmacy for all refills for the medication, unless you clear it with me beforehand.  10. You cannot share or sell this medication.

## 2022-07-27 DIAGNOSIS — S134XXA Sprain of ligaments of cervical spine, initial encounter: Secondary | ICD-10-CM | POA: Diagnosis not present

## 2022-07-27 DIAGNOSIS — S338XXA Sprain of other parts of lumbar spine and pelvis, initial encounter: Secondary | ICD-10-CM | POA: Diagnosis not present

## 2022-07-27 DIAGNOSIS — M9901 Segmental and somatic dysfunction of cervical region: Secondary | ICD-10-CM | POA: Diagnosis not present

## 2022-07-27 DIAGNOSIS — M9902 Segmental and somatic dysfunction of thoracic region: Secondary | ICD-10-CM | POA: Diagnosis not present

## 2022-07-27 DIAGNOSIS — S233XXA Sprain of ligaments of thoracic spine, initial encounter: Secondary | ICD-10-CM | POA: Diagnosis not present

## 2022-07-27 DIAGNOSIS — M546 Pain in thoracic spine: Secondary | ICD-10-CM | POA: Diagnosis not present

## 2022-07-27 DIAGNOSIS — M9903 Segmental and somatic dysfunction of lumbar region: Secondary | ICD-10-CM | POA: Diagnosis not present

## 2022-08-03 DIAGNOSIS — S134XXA Sprain of ligaments of cervical spine, initial encounter: Secondary | ICD-10-CM | POA: Diagnosis not present

## 2022-08-03 DIAGNOSIS — M9901 Segmental and somatic dysfunction of cervical region: Secondary | ICD-10-CM | POA: Diagnosis not present

## 2022-08-03 DIAGNOSIS — S338XXA Sprain of other parts of lumbar spine and pelvis, initial encounter: Secondary | ICD-10-CM | POA: Diagnosis not present

## 2022-08-03 DIAGNOSIS — S233XXA Sprain of ligaments of thoracic spine, initial encounter: Secondary | ICD-10-CM | POA: Diagnosis not present

## 2022-08-03 DIAGNOSIS — M546 Pain in thoracic spine: Secondary | ICD-10-CM | POA: Diagnosis not present

## 2022-08-03 DIAGNOSIS — M9902 Segmental and somatic dysfunction of thoracic region: Secondary | ICD-10-CM | POA: Diagnosis not present

## 2022-08-03 DIAGNOSIS — M9903 Segmental and somatic dysfunction of lumbar region: Secondary | ICD-10-CM | POA: Diagnosis not present

## 2022-08-11 DIAGNOSIS — M9903 Segmental and somatic dysfunction of lumbar region: Secondary | ICD-10-CM | POA: Diagnosis not present

## 2022-08-11 DIAGNOSIS — S338XXA Sprain of other parts of lumbar spine and pelvis, initial encounter: Secondary | ICD-10-CM | POA: Diagnosis not present

## 2022-08-11 DIAGNOSIS — S134XXA Sprain of ligaments of cervical spine, initial encounter: Secondary | ICD-10-CM | POA: Diagnosis not present

## 2022-08-11 DIAGNOSIS — M9902 Segmental and somatic dysfunction of thoracic region: Secondary | ICD-10-CM | POA: Diagnosis not present

## 2022-08-11 DIAGNOSIS — M9901 Segmental and somatic dysfunction of cervical region: Secondary | ICD-10-CM | POA: Diagnosis not present

## 2022-08-11 DIAGNOSIS — M546 Pain in thoracic spine: Secondary | ICD-10-CM | POA: Diagnosis not present

## 2022-08-11 DIAGNOSIS — S233XXA Sprain of ligaments of thoracic spine, initial encounter: Secondary | ICD-10-CM | POA: Diagnosis not present

## 2022-08-12 DIAGNOSIS — M722 Plantar fascial fibromatosis: Secondary | ICD-10-CM | POA: Diagnosis not present

## 2022-08-12 DIAGNOSIS — M79671 Pain in right foot: Secondary | ICD-10-CM | POA: Diagnosis not present

## 2022-08-20 DIAGNOSIS — S233XXA Sprain of ligaments of thoracic spine, initial encounter: Secondary | ICD-10-CM | POA: Diagnosis not present

## 2022-08-20 DIAGNOSIS — M9903 Segmental and somatic dysfunction of lumbar region: Secondary | ICD-10-CM | POA: Diagnosis not present

## 2022-08-20 DIAGNOSIS — S338XXA Sprain of other parts of lumbar spine and pelvis, initial encounter: Secondary | ICD-10-CM | POA: Diagnosis not present

## 2022-08-20 DIAGNOSIS — S134XXA Sprain of ligaments of cervical spine, initial encounter: Secondary | ICD-10-CM | POA: Diagnosis not present

## 2022-08-20 DIAGNOSIS — M546 Pain in thoracic spine: Secondary | ICD-10-CM | POA: Diagnosis not present

## 2022-08-20 DIAGNOSIS — M9901 Segmental and somatic dysfunction of cervical region: Secondary | ICD-10-CM | POA: Diagnosis not present

## 2022-08-20 DIAGNOSIS — M9902 Segmental and somatic dysfunction of thoracic region: Secondary | ICD-10-CM | POA: Diagnosis not present

## 2022-08-27 DIAGNOSIS — S233XXA Sprain of ligaments of thoracic spine, initial encounter: Secondary | ICD-10-CM | POA: Diagnosis not present

## 2022-08-27 DIAGNOSIS — M9902 Segmental and somatic dysfunction of thoracic region: Secondary | ICD-10-CM | POA: Diagnosis not present

## 2022-08-27 DIAGNOSIS — S338XXA Sprain of other parts of lumbar spine and pelvis, initial encounter: Secondary | ICD-10-CM | POA: Diagnosis not present

## 2022-08-27 DIAGNOSIS — M9903 Segmental and somatic dysfunction of lumbar region: Secondary | ICD-10-CM | POA: Diagnosis not present

## 2022-08-27 DIAGNOSIS — M546 Pain in thoracic spine: Secondary | ICD-10-CM | POA: Diagnosis not present

## 2022-08-27 DIAGNOSIS — S134XXA Sprain of ligaments of cervical spine, initial encounter: Secondary | ICD-10-CM | POA: Diagnosis not present

## 2022-08-27 DIAGNOSIS — M9901 Segmental and somatic dysfunction of cervical region: Secondary | ICD-10-CM | POA: Diagnosis not present

## 2022-09-02 DIAGNOSIS — M79671 Pain in right foot: Secondary | ICD-10-CM | POA: Diagnosis not present

## 2022-09-02 DIAGNOSIS — M722 Plantar fascial fibromatosis: Secondary | ICD-10-CM | POA: Diagnosis not present

## 2022-09-03 DIAGNOSIS — S134XXA Sprain of ligaments of cervical spine, initial encounter: Secondary | ICD-10-CM | POA: Diagnosis not present

## 2022-09-03 DIAGNOSIS — M9903 Segmental and somatic dysfunction of lumbar region: Secondary | ICD-10-CM | POA: Diagnosis not present

## 2022-09-03 DIAGNOSIS — M9902 Segmental and somatic dysfunction of thoracic region: Secondary | ICD-10-CM | POA: Diagnosis not present

## 2022-09-03 DIAGNOSIS — M546 Pain in thoracic spine: Secondary | ICD-10-CM | POA: Diagnosis not present

## 2022-09-03 DIAGNOSIS — S338XXA Sprain of other parts of lumbar spine and pelvis, initial encounter: Secondary | ICD-10-CM | POA: Diagnosis not present

## 2022-09-03 DIAGNOSIS — M9901 Segmental and somatic dysfunction of cervical region: Secondary | ICD-10-CM | POA: Diagnosis not present

## 2022-09-03 DIAGNOSIS — S233XXA Sprain of ligaments of thoracic spine, initial encounter: Secondary | ICD-10-CM | POA: Diagnosis not present

## 2022-09-03 DIAGNOSIS — H10013 Acute follicular conjunctivitis, bilateral: Secondary | ICD-10-CM | POA: Diagnosis not present

## 2022-09-10 DIAGNOSIS — R109 Unspecified abdominal pain: Secondary | ICD-10-CM | POA: Diagnosis not present

## 2022-09-10 DIAGNOSIS — I700 Atherosclerosis of aorta: Secondary | ICD-10-CM | POA: Diagnosis not present

## 2022-09-10 DIAGNOSIS — R1084 Generalized abdominal pain: Secondary | ICD-10-CM | POA: Diagnosis not present

## 2022-09-10 DIAGNOSIS — R8271 Bacteriuria: Secondary | ICD-10-CM | POA: Diagnosis not present

## 2022-09-10 DIAGNOSIS — Z90710 Acquired absence of both cervix and uterus: Secondary | ICD-10-CM | POA: Diagnosis not present

## 2022-09-15 DIAGNOSIS — M9903 Segmental and somatic dysfunction of lumbar region: Secondary | ICD-10-CM | POA: Diagnosis not present

## 2022-09-15 DIAGNOSIS — M9901 Segmental and somatic dysfunction of cervical region: Secondary | ICD-10-CM | POA: Diagnosis not present

## 2022-09-15 DIAGNOSIS — S338XXA Sprain of other parts of lumbar spine and pelvis, initial encounter: Secondary | ICD-10-CM | POA: Diagnosis not present

## 2022-09-15 DIAGNOSIS — M9902 Segmental and somatic dysfunction of thoracic region: Secondary | ICD-10-CM | POA: Diagnosis not present

## 2022-09-15 DIAGNOSIS — S134XXA Sprain of ligaments of cervical spine, initial encounter: Secondary | ICD-10-CM | POA: Diagnosis not present

## 2022-09-15 DIAGNOSIS — M546 Pain in thoracic spine: Secondary | ICD-10-CM | POA: Diagnosis not present

## 2022-09-15 DIAGNOSIS — S233XXA Sprain of ligaments of thoracic spine, initial encounter: Secondary | ICD-10-CM | POA: Diagnosis not present

## 2022-09-18 IMAGING — MG MM BREAST LOCALIZATION CLIP
4 series · 4 of 12 positions shown · non-contrast
Comparison: Previous exam(s).

CLINICAL DATA: Evaluate biopsy marker

EXAM:
DIAGNOSTIC LEFT MAMMOGRAM POST STEREOTACTIC BIOPSY

[L CC synth-2D]
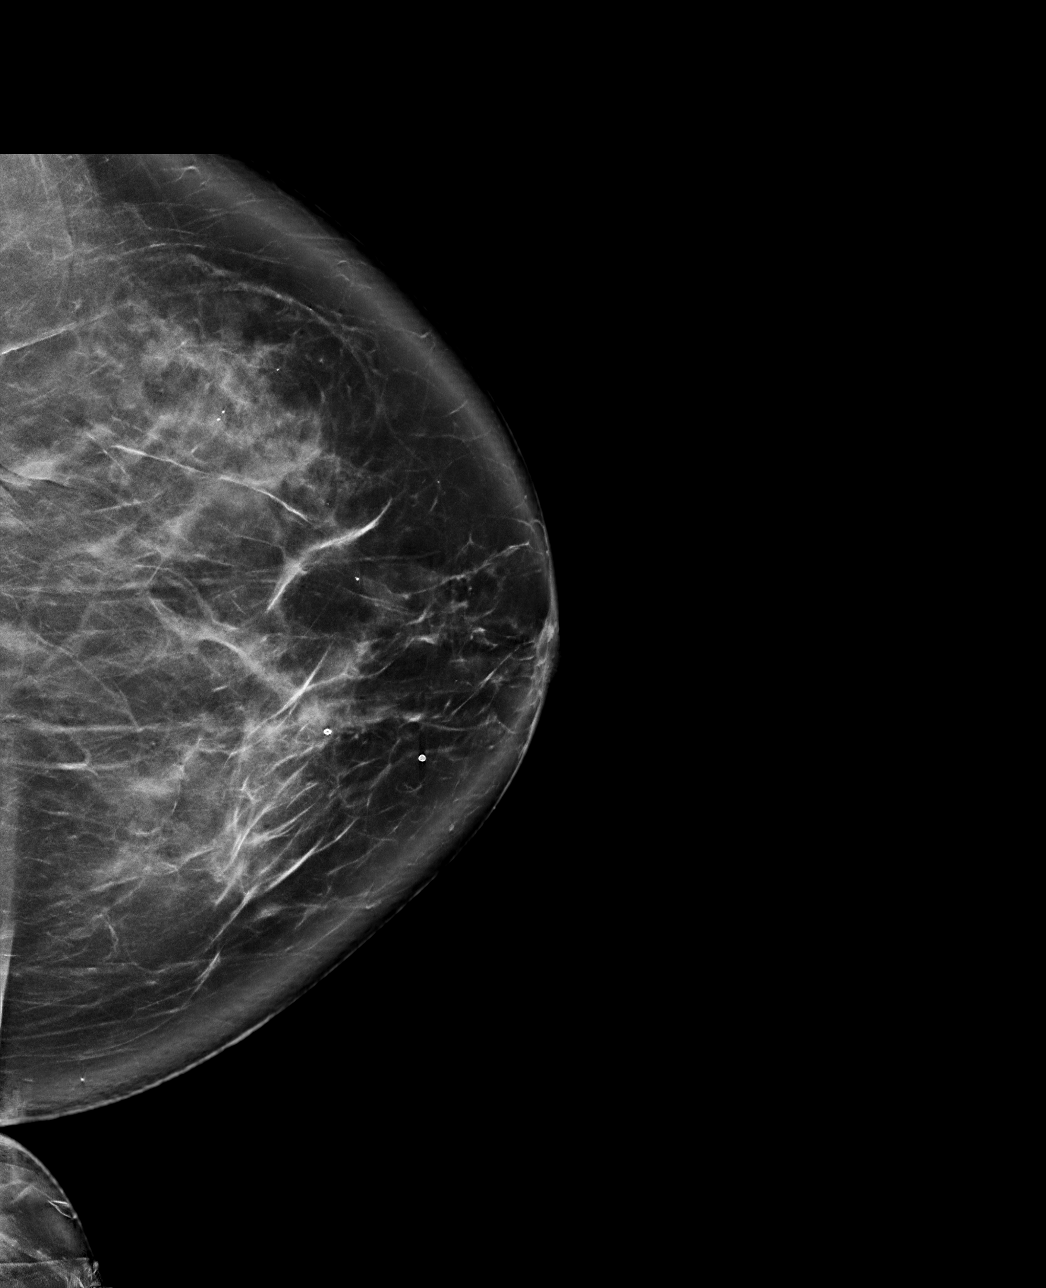

[L ML synth-2D]
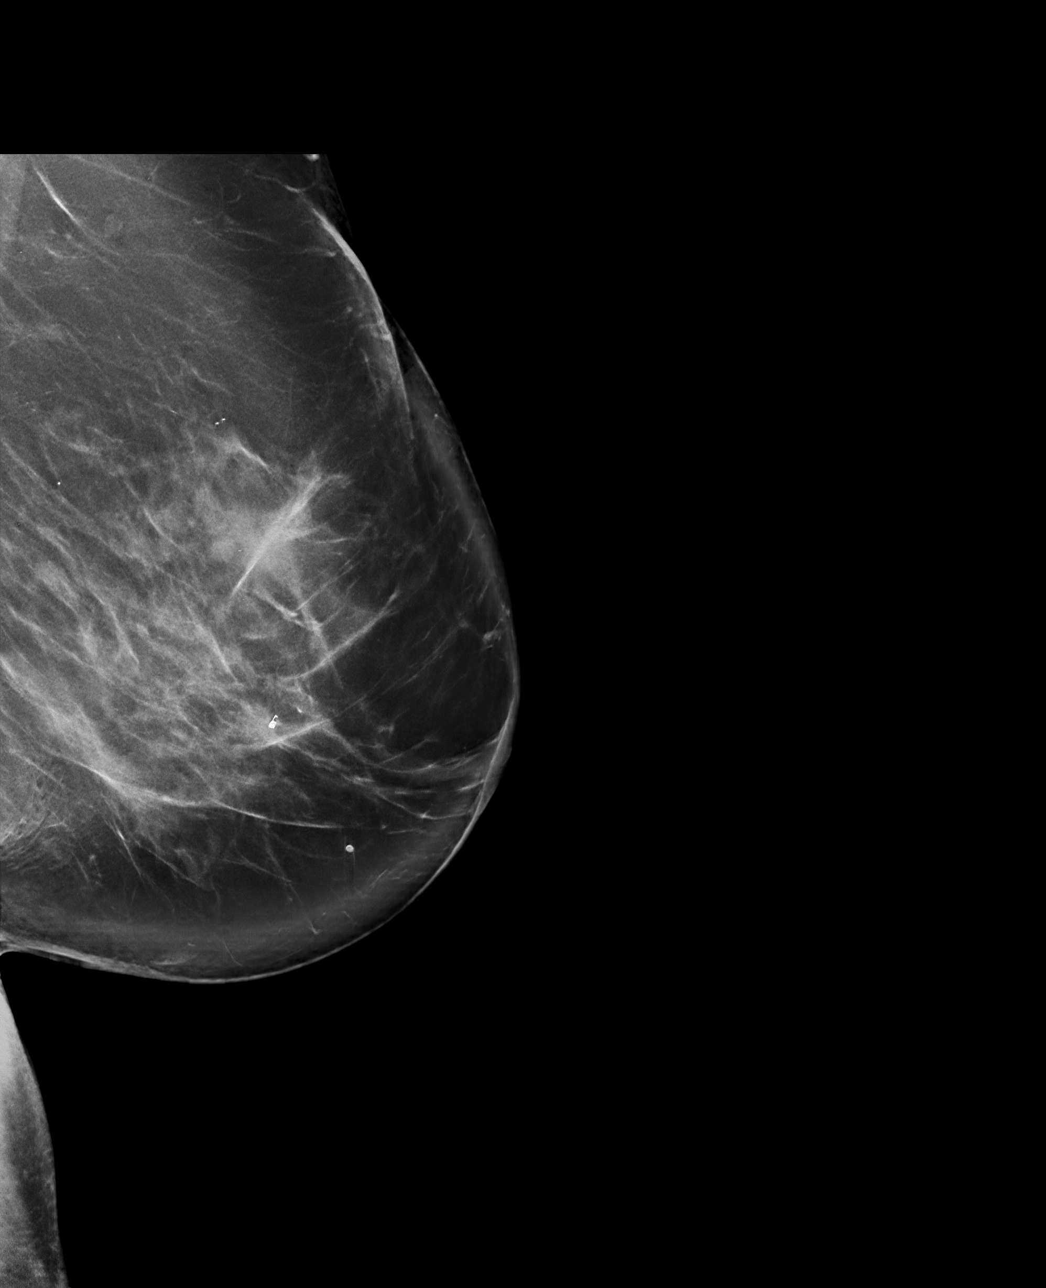

[L CC tomo · tomo slice 49/98.0]
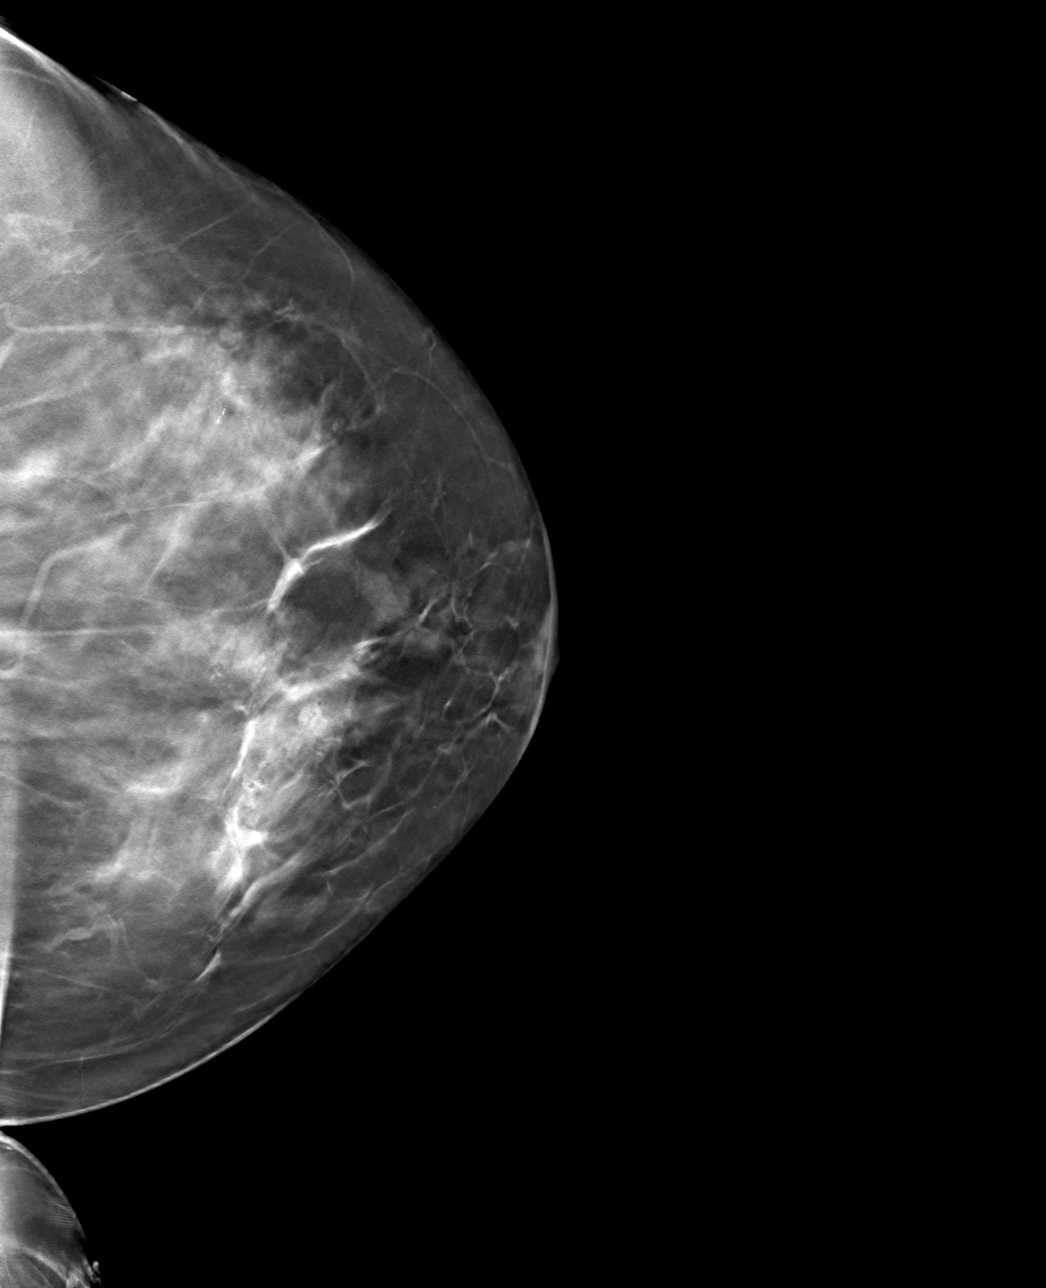

[L ML tomo · tomo slice 59/117.0]
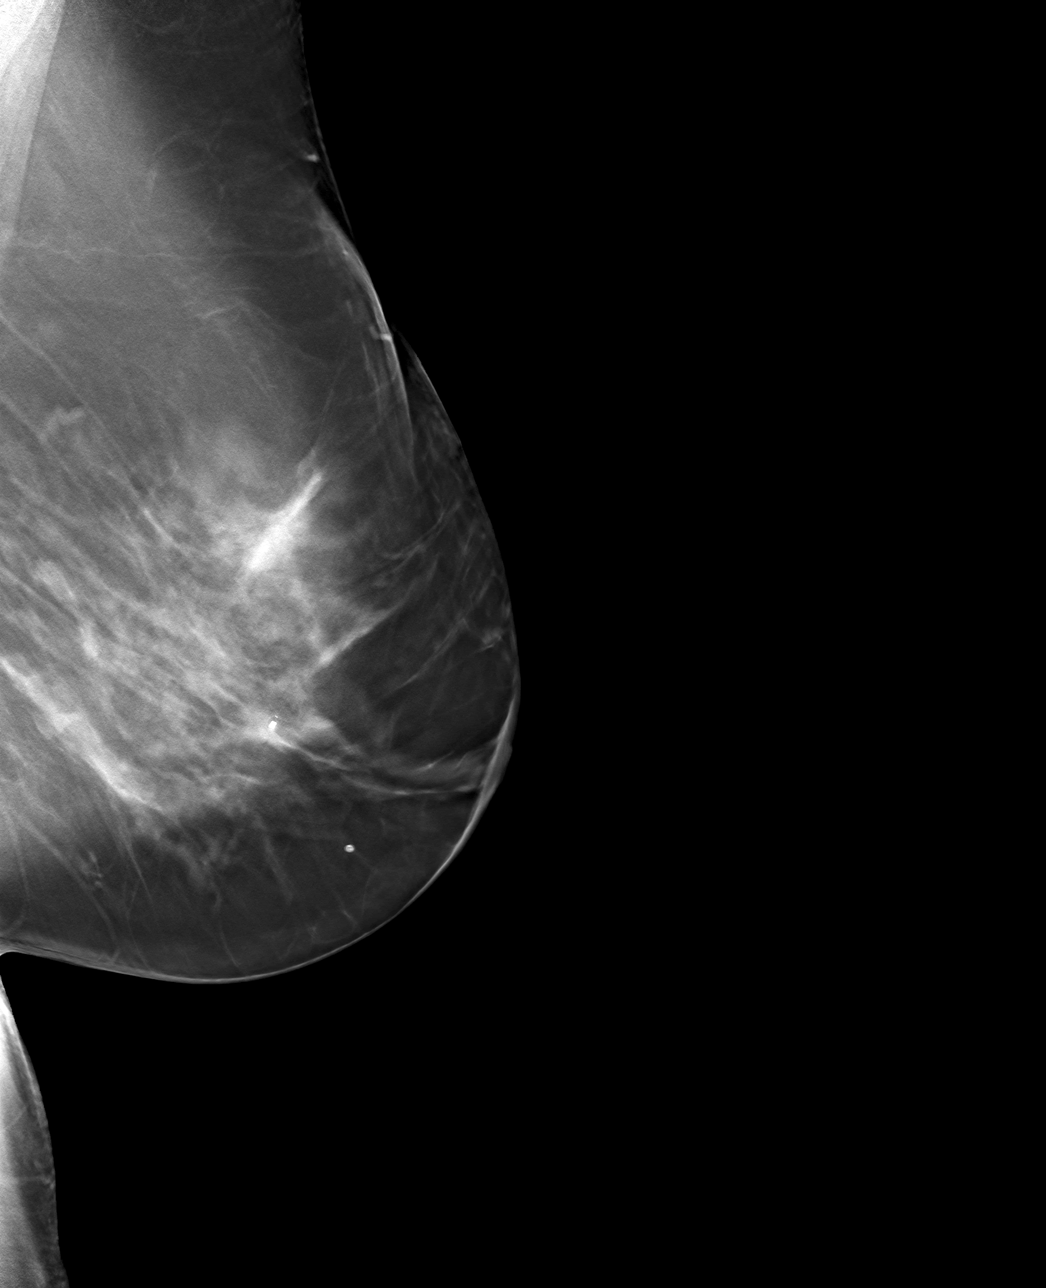

[4 of 12 positions shown; findings below may reference images not displayed]

FINDINGS: Mammographic images were obtained following stereotactic guided
biopsy of left breast distortion. The biopsy marking clip is in
expected position at the site of biopsy.
IMPRESSION: Appropriate positioning of the coil shaped biopsy marking clip at
the site of biopsy in the region of the biopsied distortion.

Final Assessment: Post Procedure Mammograms for Marker Placement

## 2022-09-28 DIAGNOSIS — R310 Gross hematuria: Secondary | ICD-10-CM | POA: Diagnosis not present

## 2022-09-28 DIAGNOSIS — R1084 Generalized abdominal pain: Secondary | ICD-10-CM | POA: Diagnosis not present

## 2022-09-29 ENCOUNTER — Other Ambulatory Visit (HOSPITAL_COMMUNITY): Payer: Self-pay

## 2022-09-29 MED ORDER — APIXABAN 5 MG PO TABS: 5.0000 mg | ORAL_TABLET | Freq: Two times a day (BID) | ORAL | 2 refills | Status: DC

## 2022-10-05 ENCOUNTER — Other Ambulatory Visit: Payer: Self-pay | Admitting: Adult Health

## 2022-10-05 DIAGNOSIS — Z9889 Other specified postprocedural states: Secondary | ICD-10-CM

## 2022-10-08 DIAGNOSIS — C50911 Malignant neoplasm of unspecified site of right female breast: Secondary | ICD-10-CM | POA: Diagnosis not present

## 2022-10-14 DIAGNOSIS — H02834 Dermatochalasis of left upper eyelid: Secondary | ICD-10-CM | POA: Diagnosis not present

## 2022-10-14 DIAGNOSIS — H31092 Other chorioretinal scars, left eye: Secondary | ICD-10-CM | POA: Diagnosis not present

## 2022-10-14 DIAGNOSIS — H04123 Dry eye syndrome of bilateral lacrimal glands: Secondary | ICD-10-CM | POA: Diagnosis not present

## 2022-10-14 DIAGNOSIS — H02831 Dermatochalasis of right upper eyelid: Secondary | ICD-10-CM | POA: Diagnosis not present

## 2022-10-14 DIAGNOSIS — Z961 Presence of intraocular lens: Secondary | ICD-10-CM | POA: Diagnosis not present

## 2022-11-02 DIAGNOSIS — R310 Gross hematuria: Secondary | ICD-10-CM | POA: Diagnosis not present

## 2022-11-09 ENCOUNTER — Ambulatory Visit (INDEPENDENT_AMBULATORY_CARE_PROVIDER_SITE_OTHER): Payer: PPO

## 2022-11-09 ENCOUNTER — Ambulatory Visit (INDEPENDENT_AMBULATORY_CARE_PROVIDER_SITE_OTHER): Payer: PPO | Admitting: Nurse Practitioner

## 2022-11-09 ENCOUNTER — Encounter: Payer: Self-pay | Admitting: Nurse Practitioner

## 2022-11-09 VITALS — BP 146/81 | HR 76 | Temp 96.7°F | Resp 20 | Ht 65.0 in | Wt 273.0 lb

## 2022-11-09 DIAGNOSIS — R0989 Other specified symptoms and signs involving the circulatory and respiratory systems: Secondary | ICD-10-CM | POA: Diagnosis not present

## 2022-11-09 DIAGNOSIS — R051 Acute cough: Secondary | ICD-10-CM

## 2022-11-09 DIAGNOSIS — R0981 Nasal congestion: Secondary | ICD-10-CM | POA: Diagnosis not present

## 2022-11-09 MED ORDER — PROMETHAZINE-DM 6.25-15 MG/5ML PO SYRP: 5.0000 mL | ORAL_SOLUTION | Freq: Four times a day (QID) | ORAL | 0 refills | Status: DC | PRN

## 2022-11-09 MED ORDER — AZITHROMYCIN 250 MG PO TABS: ORAL_TABLET | ORAL | 0 refills | Status: DC

## 2022-11-09 NOTE — Patient Instructions (Signed)
1. Take meds as prescribed 2. Use a cool mist humidifier especially during the winter months and when heat has been humid. 3. Use saline nose sprays frequently 4. Saline irrigations of the nose can be very helpful if done frequently.  * 4X daily for 1 week*  * Use of a nettie pot can be helpful with this. Follow directions with this* 5. Drink plenty of fluids 6. Keep thermostat turn down low 7.For any cough or congestion- promethazine Dm 8. For fever or aces or pains- take tylenol or ibuprofen appropriate for age and weight.  * for fevers greater than 101 orally you may alternate ibuprofen and tylenol every  3 hours.

## 2022-11-09 NOTE — Progress Notes (Signed)
Subjective:    Patient ID: [name] Collier, female    DOB: [birth year], 76 y.o.   MRN: [id number]   Chief Complaint: cough  Cough This is a new problem. The current episode started in the past 7 days. The problem has been waxing and waning. The problem occurs constantly. The cough is Productive of sputum. Associated symptoms include rhinorrhea and shortness of breath. Pertinent negatives include no chills or fever. Nothing aggravates the symptoms. She has tried OTC cough suppressant for the symptoms. The treatment provided mild relief.       Review of Systems  Constitutional:  Negative for chills and fever.  HENT:  Positive for rhinorrhea.   Respiratory:  Positive for cough and shortness of breath.        Objective:   Physical Exam Vitals reviewed.  Constitutional:      Appearance: She is obese.  HENT:     Right Ear: Tympanic membrane normal.     Left Ear: Tympanic membrane normal.     Nose: Congestion present. No rhinorrhea.     Mouth/Throat:     Mouth: Mucous membranes are moist.  Cardiovascular:     Rate and Rhythm: Normal rate and regular rhythm.     Heart sounds: Normal heart sounds.  Pulmonary:     Breath sounds: Normal breath sounds. No wheezing or rhonchi.  Skin:    General: Skin is warm.  Neurological:     General: No focal deficit present.     Mental Status: She is alert.  Psychiatric:        Mood and Affect: Mood normal.        Behavior: Behavior normal.     BP (!) 146/81   Pulse 76   Temp (!) 96.7 F (35.9 C) (Temporal)   Resp 20   Ht '5\' 5"'$  (1.651 m)   Wt 273 lb (123.8 kg)   SpO2 93%   BMI 45.43 kg/m   Chest xray- possible right lower lobe infiltrate-Preliminary reading by Ronnald Collum, FNP  [facility]       Assessment & Plan:   [name] Collier in today with chief complaint of Cough and Nasal Congestion   1. Nasal congestion - DG Chest 2 View  2. Acute cough 1. Take meds as prescribed 2. Use a cool mist humidifier especially during the  winter months and when heat has been humid. 3. Use saline nose sprays frequently 4. Saline irrigations of the nose can be very helpful if done frequently.  * 4X daily for 1 week*  * Use of a nettie pot can be helpful with this. Follow directions with this* 5. Drink plenty of fluids 6. Keep thermostat turn down low 7.For any cough or congestion- promethaxzine as prescribed 8. For fever or aces or pains- take tylenol or ibuprofen appropriate for age and weight.  * for fevers greater than 101 orally you may alternate ibuprofen and tylenol every  3 hours.    - promethazine-dextromethorphan (PROMETHAZINE-DM) 6.25-15 MG/5ML syrup; Take 5 mLs by mouth 4 (four) times daily as needed for cough.  Dispense: 118 mL; Refill: 0 - azithromycin (ZITHROMAX Z-PAK) 250 MG tablet; As directed  Dispense: 6 tablet; Refill: 0    The above assessment and management plan was discussed with the patient. The patient verbalized understanding of and has agreed to the management plan. Patient is aware to call the clinic if symptoms persist or worsen. Patient is aware when to return to the clinic for a follow-up visit. Patient  educated on when it is appropriate to go to the emergency department.   Mary-Margaret Hassell Done, FNP

## 2022-11-10 ENCOUNTER — Encounter: Payer: Self-pay | Admitting: Family Medicine

## 2022-11-10 ENCOUNTER — Other Ambulatory Visit: Payer: Self-pay | Admitting: Family Medicine

## 2022-11-10 DIAGNOSIS — R051 Acute cough: Secondary | ICD-10-CM

## 2022-11-10 MED ORDER — CEFDINIR 300 MG PO CAPS: 300.0000 mg | ORAL_CAPSULE | Freq: Two times a day (BID) | ORAL | 0 refills | Status: DC

## 2022-11-17 ENCOUNTER — Ambulatory Visit
Admission: RE | Admit: 2022-11-17 | Discharge: 2022-11-17 | Disposition: A | Discharge status: Home / Self Care | Payer: PPO | Source: Ambulatory Visit | Attending: Adult Health | Admitting: Adult Health

## 2022-11-17 DIAGNOSIS — Z853 Personal history of malignant neoplasm of breast: Secondary | ICD-10-CM | POA: Diagnosis not present

## 2022-11-17 DIAGNOSIS — Z9889 Other specified postprocedural states: Secondary | ICD-10-CM

## 2022-11-17 DIAGNOSIS — R922 Inconclusive mammogram: Secondary | ICD-10-CM | POA: Diagnosis not present

## 2022-12-11 ENCOUNTER — Other Ambulatory Visit: Payer: Self-pay | Admitting: *Deleted

## 2022-12-11 DIAGNOSIS — R0602 Shortness of breath: Secondary | ICD-10-CM

## 2022-12-11 DIAGNOSIS — I10 Essential (primary) hypertension: Secondary | ICD-10-CM

## 2022-12-11 DIAGNOSIS — E785 Hyperlipidemia, unspecified: Secondary | ICD-10-CM

## 2022-12-11 MED ORDER — OLMESARTAN MEDOXOMIL 5 MG PO TABS: 5.0000 mg | ORAL_TABLET | Freq: Every day | ORAL | 0 refills | Status: DC

## 2022-12-11 MED ORDER — FUROSEMIDE 20 MG PO TABS: 20.0000 mg | ORAL_TABLET | Freq: Every day | ORAL | 0 refills | Status: DC

## 2022-12-11 MED ORDER — EZETIMIBE 10 MG PO TABS: 10.0000 mg | ORAL_TABLET | Freq: Every day | ORAL | 0 refills | Status: DC

## 2022-12-11 MED ORDER — FAMOTIDINE 20 MG PO TABS: 20.0000 mg | ORAL_TABLET | Freq: Two times a day (BID) | ORAL | 0 refills | Status: DC

## 2022-12-29 ENCOUNTER — Ambulatory Visit (HOSPITAL_COMMUNITY)
Admission: RE | Admit: 2022-12-29 | Discharge: 2022-12-29 | Disposition: A | Discharge status: Home / Self Care | Payer: PPO | Source: Ambulatory Visit | Attending: Nurse Practitioner | Admitting: Nurse Practitioner

## 2022-12-29 VITALS — BP 162/86 | HR 64 | Ht 65.0 in | Wt 273.4 lb

## 2022-12-29 DIAGNOSIS — Z7901 Long term (current) use of anticoagulants: Secondary | ICD-10-CM | POA: Diagnosis not present

## 2022-12-29 DIAGNOSIS — E785 Hyperlipidemia, unspecified: Secondary | ICD-10-CM | POA: Diagnosis not present

## 2022-12-29 DIAGNOSIS — I4819 Other persistent atrial fibrillation: Secondary | ICD-10-CM | POA: Insufficient documentation

## 2022-12-29 DIAGNOSIS — Z79899 Other long term (current) drug therapy: Secondary | ICD-10-CM | POA: Insufficient documentation

## 2022-12-29 DIAGNOSIS — I10 Essential (primary) hypertension: Secondary | ICD-10-CM | POA: Insufficient documentation

## 2022-12-29 DIAGNOSIS — I480 Paroxysmal atrial fibrillation: Secondary | ICD-10-CM

## 2022-12-29 DIAGNOSIS — Z8249 Family history of ischemic heart disease and other diseases of the circulatory system: Secondary | ICD-10-CM | POA: Insufficient documentation

## 2022-12-29 DIAGNOSIS — D6869 Other thrombophilia: Secondary | ICD-10-CM

## 2022-12-29 DIAGNOSIS — Z8673 Personal history of transient ischemic attack (TIA), and cerebral infarction without residual deficits: Secondary | ICD-10-CM | POA: Insufficient documentation

## 2022-12-29 LAB — CBC
HCT: 40.7 % (ref 36.0–46.0)
Hemoglobin: 13.4 g/dL (ref 12.0–15.0)
MCH: 29.9 pg (ref 26.0–34.0)
MCHC: 32.9 g/dL (ref 30.0–36.0)
MCV: 90.8 fL (ref 80.0–100.0)
Platelets: 251 10*3/uL (ref 150–400)
RBC: 4.48 MIL/uL (ref 3.87–5.11)
RDW: 13.2 % (ref 11.5–15.5)
WBC: 6 10*3/uL (ref 4.0–10.5)
nRBC: 0 % (ref 0.0–0.2)

## 2022-12-29 LAB — BASIC METABOLIC PANEL
Anion gap: 11 (ref 5–15)
BUN: 17 mg/dL (ref 8–23)
CO2: 24 mmol/L (ref 22–32)
Calcium: 9.4 mg/dL (ref 8.9–10.3)
Chloride: 103 mmol/L (ref 98–111)
Creatinine, Ser: 0.74 mg/dL (ref 0.44–1.00)
GFR, Estimated: >60 mL/min (ref >60)
Glucose, Bld: 97 mg/dL (ref 70–99)
Potassium: 4.2 mmol/L (ref 3.5–5.1)
Sodium: 138 mmol/L (ref 135–145)

## 2022-12-29 LAB — MAGNESIUM: Magnesium: 1.9 mg/dL (ref 1.7–2.4)

## 2022-12-29 NOTE — Progress Notes (Signed)
Primary Care Physician: [name], DO Referring Physician: Dr. Radford Pax Cardiologist: Dr. Percival Spanish EP: Dr. [name] [name] Collier is a 77 y.o. female with a h/o afib, HTN, hyperlipidemia and TIA. She is in the afib clinic for f/u of Tikosyn use. She feels well with NSR today. She did have a mechanical fall a couple of weeks ago and was placed on metoprolol 25 mg bid for afib secondary to the fall. . She did convert in a couple of days and then stopped BB with issues with hypotension and bradycardia. Now however, her BP has been running higher, around 706-237 systolic.  Continues on xarelto 20 mg daily with a CHA2DS2VASc of at least 5, without any signs of bleeding.   F/u with afib clinic 06/19/21. She is staying in [place] with Tikosyn. She has had an lumpectomy and radiation. Is finished with treatment now and is on daily. Arimidex. She is pending a colonoscopy and has received instruction re holding her [place]. Reminded to take Tikosyn with a sip of water.    F/u in afib clinic, 07/22/21,  after a hemo peritoneal bleed around her spleen following a colonoscopy. She had minimal cbc drop and was observed x 2 days in the hospital. It did resolve without any intervention. She was switched form xarelto to eliquis on d/c.   F/u in afib clinic, 12/30/21 for ongoing  Tikosyn use. She is continuing  in [place]. No issues with anticoagulation. States that she has has had some recent stomach upset with diarrhea. Will check labs today.   F/u 06/30/22 for tikosyn surveillance. Ekg shows SR. No afib to report. Has had some issues with L more so than Rt LE cellulitis. Has recently had her 3rd round of antibiotics. Started with an injury to LL leg. Recently fitted with support hose by PCP.   F/u in afib clinic,12/29/22 for Tikosyn surveillance. She is in SR with stable qt. She had an upper respiratory infection in December but is now resolved. No complaints voiced today.   Today, she denies symptoms of palpitations,  chest pain, shortness of breath, orthopnea, PND, lower extremity edema, dizziness, presyncope, syncope, or neurologic sequela. The patient is tolerating medications without difficulties and is otherwise without complaint today.   Past Medical History:  Diagnosis Date   Anxiety    Atrial fibrillation ([place])    Cancer ([place]) 11/2020   left breast IMC   GERD (gastroesophageal reflux disease)    HTN (hypertension)    Hyperlipidemia    Personal history of radiation therapy    TIA (transient ischemic attack)    Past Surgical History:  Procedure Laterality Date   BOTOX INJECTION N/A 12/22/2018   Procedure: BOTOX INJECTION INTO ANAL SPHINCTER;  Surgeon: Ileana Roup, MD;  Location: [facility];  Service: General;  Laterality: N/A;   BREAST BIOPSY Left 11/26/2020   BREAST LUMPECTOMY WITH RADIOACTIVE SEED AND SENTINEL LYMPH NODE BIOPSY Left 01/07/2021   Procedure: LEFT BREAST LUMPECTOMY WITH RADIOACTIVE SEED AND LEFT AXILLARY SENTINEL LYMPH NODE BIOPSY WITH BLUE DYE INJECTION;  Surgeon: Donnie Mesa, MD;  Location: [facility];  Service: General;  Laterality: Left;  LMA WITH PECTORAL BLOCK, BLUE DYE INJECTION   CARDIOVERSION N/A 01/04/2018   Procedure: CARDIOVERSION;  Surgeon: Dorothy Spark, MD;  Location: [facility] ENDOSCOPY;  Service: Cardiovascular;  Laterality: N/A;   CARDIOVERSION N/A 09/20/2018   Procedure: CARDIOVERSION;  Surgeon: Sueanne Margarita, MD;  Location: [place];  Service: Cardiovascular;  Laterality: N/A;  CARDIOVERSION N/A 10/27/2018   Procedure: CARDIOVERSION;  Surgeon: Buford Dresser, MD;  Location: [facility] ENDOSCOPY;  Service: Cardiovascular;  Laterality: N/A;   KNEE SURGERY Bilateral    Torn miniscus   RECTAL EXAM UNDER ANESTHESIA N/A 12/22/2018   Procedure: ANORECTAL  EXAM UNDER ANESTHESIA;  Surgeon: Ileana Roup, MD;  Location: [facility];  Service: General;  Laterality: N/A;   RECTOCELE REPAIR     TONSILLECTOMY     VAGINAL HYSTERECTOMY       Current Outpatient Medications  Medication Sig Dispense Refill   acetaminophen (TYLENOL) 500 MG tablet Take 1,000 mg by mouth every 6 (six) hours as needed for moderate pain or headache.     amLODipine (NORVASC) 5 MG tablet Take 1.5 tablets (7.5 mg total) by mouth daily. 135 tablet 3   anastrozole (ARIMIDEX) 1 MG tablet Take 1 tablet (1 mg total) by mouth daily. 90 tablet 3   apixaban (ELIQUIS) 5 MG TABS tablet Take 1 tablet (5 mg total) by mouth 2 (two) times daily. 180 tablet 2   Cholecalciferol (VITAMIN D3) 5000 units CAPS Take 5,000 Units by mouth daily with supper.     dofetilide (TIKOSYN) 500 MCG capsule TAKE 1 CAPSULE BY MOUTH TWICE A DAY 180 capsule 2   ezetimibe (ZETIA) 10 MG tablet Take 1 tablet (10 mg total) by mouth daily. 90 tablet 0   famotidine (PEPCID) 20 MG tablet Take 1 tablet (20 mg total) by mouth 2 (two) times daily. 180 tablet 0   furosemide (LASIX) 20 MG tablet Take 1 tablet (20 mg total) by mouth daily. 90 tablet 0   LORazepam (ATIVAN) 0.5 MG tablet Take 0.5-1 tablets (0.25-0.5 mg total) by mouth daily as needed for anxiety. 20 tablet 2   olmesartan (BENICAR) 5 MG tablet Take 1 tablet (5 mg total) by mouth daily. 90 tablet 0   potassium chloride SA (KLOR-CON M) 20 MEQ tablet Take 1 tablet (20 mEq total) by mouth 2 (two) times daily. 180 tablet 3   No current facility-administered medications for this encounter.    Allergies  Allergen Reactions   Augmentin [Amoxicillin-Pot Clavulanate] Other (See Comments)    c-diff Has patient had a PCN reaction causing immediate rash, facial/tongue/throat swelling, SOB or lightheadedness with hypotension: No Has patient had a PCN reaction causing severe rash involving mucus membranes or skin necrosis: No Has patient had a PCN reaction that required hospitalization: No Has patient had a PCN reaction occurring within the last 10 years: Yes If all of the above answers are "NO", then may proceed with Cephalosporin use.     Codeine Palpitations and Rash   Prednisone Other (See Comments)    Jittery, red in the face   Celecoxib Other (See Comments)    Social History   Socioeconomic History   Marital status: Widowed    Spouse name: Not on file   Number of children: 2   Years of education: 61   Highest education level: Not on file  Occupational History   Occupation: Retired    Comment: Personnel officer  Tobacco Use   Smoking status: Never   Smokeless tobacco: Never   Tobacco comments:    Never smoke 06/30/22  Vaping Use   Vaping Use: Never used  Substance and Sexual Activity   Alcohol use: No    Alcohol/week: 0.0 standard drinks of alcohol   Drug use: No   Sexual activity: Not Currently    Birth control/protection: Post-menopausal  Other Topics Concern   Not on file  Social History Narrative   Lives alone - children in [place] and [place] -    Caffeine use: Soda/tea daily   Good church support group   Social Determinants of Health   Financial Resource Strain: Low Risk  (04/30/2022)   Overall Financial Resource Strain (CARDIA)    Difficulty of Paying Living Expenses: Not very hard  Food Insecurity: No Food Insecurity (04/30/2022)   Hunger Vital Sign    Worried About Running Out of Food in the Last Year: Never true    Ran Out of Food in the Last Year: Never true  Transportation Needs: No Transportation Needs (04/30/2022)   PRAPARE - Hydrologist (Medical): No    Lack of Transportation (Non-Medical): No  Physical Activity: Inactive (04/30/2022)   Exercise Vital Sign    Days of Exercise per Week: 0 days    Minutes of Exercise per Session: 0 min  Stress: Stress Concern Present (04/30/2022)   [place]    Feeling of Stress : To some extent  Social Connections: Moderately Integrated (04/30/2022)   Social Connection and Isolation Panel [NHANES]    Frequency of Communication with Friends and Family:  More than three times a week    Frequency of Social Gatherings with Friends and Family: More than three times a week    Attends Religious Services: More than 4 times per year    Active Member of [employer] or Organizations: Yes    Attends Archivist Meetings: More than 4 times per year    Marital Status: Widowed  Intimate Partner Violence: Not At Risk (04/30/2022)   Humiliation, Afraid, Rape, and Kick questionnaire    Fear of Current or Ex-Partner: No    Emotionally Abused: No    Physically Abused: No    Sexually Abused: No    Family History  Problem Relation Age of Onset   Hypertension Mother    Hyperlipidemia Mother    Neurologic Disorder Mother 29       GB   Stroke Son    Stroke Maternal Uncle    Stroke Grandchild    Prostate cancer Brother     ROS- All systems are reviewed and negative except as per the HPI above  Physical Exam: Vitals:   12/29/22 1339  BP: (!) 162/86  Pulse: 64  Weight: 124 kg  Height: '5\' 5"'$  (1.651 m)    Wt Readings from Last 3 Encounters:  12/29/22 124 kg  11/09/22 123.8 kg  07/21/22 125.5 kg    Labs: Lab Results  Component Value Date   NA 141 06/30/2022   K 4.2 06/30/2022   CL 105 06/30/2022   CO2 26 06/30/2022   GLUCOSE 109 (H) 06/30/2022   BUN 11 06/30/2022   CREATININE 0.82 06/30/2022   CALCIUM 9.4 06/30/2022   PHOS 4.1 06/30/2021   MG 2.2 06/30/2022   Lab Results  Component Value Date   INR 1.7 (H) 06/29/2021   Lab Results  Component Value Date   CHOL 173 04/24/2022   HDL 56 04/24/2022   LDLCALC 98 04/24/2022   TRIG 103 04/24/2022     GEN- The patient is well appearing, alert and oriented x 3 today.   Head- normocephalic, atraumatic Eyes-  Sclera clear, conjunctiva pink Ears- [name] Collier intact Oropharynx- clear Neck- supple, no JVP Lymph- no cervical lymphadenopathy Lungs- Clear to ausculation bilaterally, normal work of breathing Heart- regular rate and rhythm, no murmurs, rubs or gallops, PMI not  laterally  displaced GI- soft, NT, ND, + BS Extremities- no clubbing, cyanosis, trace LLE, mild redness noted of shin areas, left > than right  MS- no significant deformity or atrophy Skin- no rash or lesion Psych- euthymic mood, full affect Neuro- strength and sensation are intact  EKG -Vent. rate 64 BPM PR interval 194 ms QRS duration 72 ms QT/QTcB 400/412 ms P-R-T axes 45 -27 57 Sinus rhythm with occasional Premature ventricular complexes Low voltage QRS Cannot rule out Anterior infarct , age undetermined Abnormal ECG When compared with ECG of 30-Jun-2022 10:42, PREVIOUS ECG IS PRESENT  - Left ventricle: The cavity size was normal. There was mild   concentric hypertrophy. Systolic function was normal. The   estimated ejection fraction was in the range of 60% to 65%. Wall   motion was normal; there were no regional wall motion   abnormalities. - Aortic valve: Transvalvular velocity was within the normal range.   There was no stenosis. There was no regurgitation. - Aorta: Ascending aortic diameter: 38 mm (S). - Ascending aorta: The ascending aorta was mildly dilated. - Mitral valve: Mildly calcified annulus. Transvalvular velocity   was within the normal range. There was no evidence for stenosis.   There was mild regurgitation. - Left atrium: The atrium was mildly dilated.LAD 57 mm, volume 78 ml - Right ventricle: The cavity size was normal. Wall thickness was   normal. Systolic function was normal. - Atrial septum: No defect or patent foramen ovale was identified. - Tricuspid valve: There was mild-moderate regurgitation. - Pulmonic valve: There was moderate regurgitation. - Pulmonary arteries: Systolic pressure was severely increased. PA   peak pressure: 69 mm Hg (S).     Assessment and Plan: 1. Persistent afib Maintaining SR on Tikosyn 500 mcg bid qtc stable Continue eliquis 5 mg bid for CHA2DS2VASc of at least 5 Bmet/mag today   2. HTN Elevated here but stable at home     F/u with afib clinic in 6 months   [place]. Azan Maneri, [facility] [address] [place], [place] [address] [phone]

## 2023-01-14 ENCOUNTER — Encounter (HOSPITAL_COMMUNITY): Payer: Self-pay | Admitting: *Deleted

## 2023-01-18 ENCOUNTER — Telehealth: Payer: Self-pay | Admitting: Family Medicine

## 2023-01-18 ENCOUNTER — Other Ambulatory Visit (HOSPITAL_COMMUNITY): Payer: Self-pay | Admitting: *Deleted

## 2023-01-18 DIAGNOSIS — E7800 Pure hypercholesterolemia, unspecified: Secondary | ICD-10-CM

## 2023-01-18 DIAGNOSIS — I10 Essential (primary) hypertension: Secondary | ICD-10-CM

## 2023-01-18 DIAGNOSIS — R7989 Other specified abnormal findings of blood chemistry: Secondary | ICD-10-CM

## 2023-01-18 MED ORDER — APIXABAN 5 MG PO TABS: 5.0000 mg | ORAL_TABLET | Freq: Two times a day (BID) | ORAL | 2 refills | Status: DC

## 2023-01-18 NOTE — Telephone Encounter (Signed)
Please add future lab order for patient. She has an appt with PCP on 2/16 and wants to come tomorrow to do her lab work.   Call pt when orders have been added so she knows when to come in.

## 2023-01-19 NOTE — Telephone Encounter (Signed)
Orders placed, patient aware

## 2023-01-20 ENCOUNTER — Other Ambulatory Visit: Payer: PPO

## 2023-01-20 DIAGNOSIS — I10 Essential (primary) hypertension: Secondary | ICD-10-CM | POA: Diagnosis not present

## 2023-01-20 DIAGNOSIS — R7989 Other specified abnormal findings of blood chemistry: Secondary | ICD-10-CM

## 2023-01-20 DIAGNOSIS — E7800 Pure hypercholesterolemia, unspecified: Secondary | ICD-10-CM

## 2023-01-20 LAB — LIPID PANEL

## 2023-01-21 LAB — CBC WITH DIFFERENTIAL/PLATELET
Basophils Absolute: 0 10*3/uL (ref 0.0–0.2)
Basos: 1 %
EOS (ABSOLUTE): 0.1 10*3/uL (ref 0.0–0.4)
Eos: 3 %
Hematocrit: 41.4 % (ref 34.0–46.6)
Hemoglobin: 13.5 g/dL (ref 11.1–15.9)
Immature Grans (Abs): 0 10*3/uL (ref 0.0–0.1)
Immature Granulocytes: 0 %
Lymphocytes Absolute: 1.8 10*3/uL (ref 0.7–3.1)
Lymphs: 36 %
MCH: 29.2 pg (ref 26.6–33.0)
MCHC: 32.6 g/dL (ref 31.5–35.7)
MCV: 90 fL (ref 79–97)
Monocytes Absolute: 0.6 10*3/uL (ref 0.1–0.9)
Monocytes: 12 %
Neutrophils Absolute: 2.4 10*3/uL (ref 1.4–7.0)
Neutrophils: 48 %
Platelets: 249 10*3/uL (ref 150–450)
RBC: 4.62 x10E6/uL (ref 3.77–5.28)
RDW: 12.8 % (ref 11.7–15.4)
WBC: 4.9 10*3/uL (ref 3.4–10.8)

## 2023-01-21 LAB — LIPID PANEL
Chol/HDL Ratio: 3.2 ratio (ref 0.0–4.4)
Cholesterol, Total: 194 mg/dL (ref 100–199)
HDL: 61 mg/dL
LDL Chol Calc (NIH): 111 mg/dL — ABNORMAL HIGH (ref 0–99)
Triglycerides: 125 mg/dL (ref 0–149)
VLDL Cholesterol Cal: 22 mg/dL (ref 5–40)

## 2023-01-21 LAB — THYROID PANEL WITH TSH
Free Thyroxine Index: 2.8 (ref 1.2–4.9)
T3 Uptake Ratio: 28 % (ref 24–39)
T4, Total: 10.1 ug/dL (ref 4.5–12.0)
TSH: 3.98 u[IU]/mL (ref 0.450–4.500)

## 2023-01-21 LAB — CMP14+EGFR
ALT: 16 IU/L (ref 0–32)
AST: 18 IU/L (ref 0–40)
Albumin/Globulin Ratio: 1.4 (ref 1.2–2.2)
Albumin: 4.1 g/dL (ref 3.8–4.8)
Alkaline Phosphatase: 101 IU/L (ref 44–121)
BUN/Creatinine Ratio: 16 (ref 12–28)
BUN: 13 mg/dL (ref 8–27)
Bilirubin Total: 1.1 mg/dL (ref 0.0–1.2)
CO2: 24 mmol/L (ref 20–29)
Calcium: 9.6 mg/dL (ref 8.7–10.3)
Chloride: 105 mmol/L (ref 96–106)
Creatinine, Ser: 0.8 mg/dL (ref 0.57–1.00)
Globulin, Total: 2.9 g/dL (ref 1.5–4.5)
Glucose: 104 mg/dL — ABNORMAL HIGH (ref 70–99)
Potassium: 4.5 mmol/L (ref 3.5–5.2)
Sodium: 144 mmol/L (ref 134–144)
Total Protein: 7 g/dL (ref 6.0–8.5)
eGFR: 76 mL/min/1.73

## 2023-01-22 ENCOUNTER — Encounter: Payer: Self-pay | Admitting: Family Medicine

## 2023-01-22 ENCOUNTER — Ambulatory Visit (INDEPENDENT_AMBULATORY_CARE_PROVIDER_SITE_OTHER): Payer: PPO | Admitting: Family Medicine

## 2023-01-22 VITALS — BP 145/66 | HR 65 | Temp 98.6°F | Ht 65.0 in | Wt 274.0 lb

## 2023-01-22 DIAGNOSIS — I10 Essential (primary) hypertension: Secondary | ICD-10-CM | POA: Diagnosis not present

## 2023-01-22 DIAGNOSIS — I480 Paroxysmal atrial fibrillation: Secondary | ICD-10-CM | POA: Diagnosis not present

## 2023-01-22 DIAGNOSIS — Z79899 Other long term (current) drug therapy: Secondary | ICD-10-CM

## 2023-01-22 DIAGNOSIS — Z6841 Body Mass Index (BMI) 40.0 and over, adult: Secondary | ICD-10-CM | POA: Diagnosis not present

## 2023-01-22 DIAGNOSIS — F411 Generalized anxiety disorder: Secondary | ICD-10-CM | POA: Diagnosis not present

## 2023-01-22 DIAGNOSIS — F410 Panic disorder [episodic paroxysmal anxiety] without agoraphobia: Secondary | ICD-10-CM

## 2023-01-22 DIAGNOSIS — E7800 Pure hypercholesterolemia, unspecified: Secondary | ICD-10-CM | POA: Diagnosis not present

## 2023-01-22 MED ORDER — LORAZEPAM 0.5 MG PO TABS: 0.2500 mg | ORAL_TABLET | Freq: Every day | ORAL | 2 refills | Status: DC | PRN

## 2023-01-22 NOTE — Progress Notes (Signed)
Subjective: CC: Anxiety disorder with panic attack PCP: [name], DO [id number] [name] Collier is a 77 y.o. female presenting to clinic today for:  1.  Generalized anxiety disorder panic attack Patient utilizes the Ativan infrequently but does want to have a supply to have on hand going forward.  Denies any excessive daytime sedation, falls, respiratory pression, visual or auditory hallucinations.  No changes in memory  2.  Atrial fibrillation, hypertension Patient is compliant with Norvasc, Tikosyn, Eliquis.  She did feel like he is having some heart palpitations when she was ill in December but wonders if perhaps it was related to the medication she was taking.  She was on a cough syrup containing promethazine.  She denies any bleeding episodes.  Eliquis has been expensive and she worries about going into the donut hole on this medication.   ROS: Per HPI  Allergies  Allergen Reactions   Augmentin [Amoxicillin-Pot Clavulanate] Other (See Comments)    c-diff Has patient had a PCN reaction causing immediate rash, facial/tongue/throat swelling, SOB or lightheadedness with hypotension: No Has patient had a PCN reaction causing severe rash involving mucus membranes or skin necrosis: No Has patient had a PCN reaction that required hospitalization: No Has patient had a PCN reaction occurring within the last 10 years: Yes If all of the above answers are "NO", then may proceed with Cephalosporin use.    Codeine Palpitations and Rash   Prednisone Other (See Comments)    Jittery, red in the face   Celecoxib Other (See Comments)   Past Medical History:  Diagnosis Date   Anxiety    Atrial fibrillation ([place])    Cancer ([place]) 11/2020   left breast IMC   GERD (gastroesophageal reflux disease)    HTN (hypertension)    Hyperlipidemia    Personal history of radiation therapy    TIA (transient ischemic attack)     Current Outpatient Medications:    acetaminophen (TYLENOL) 500 MG  tablet, Take 1,000 mg by mouth every 6 (six) hours as needed for moderate pain or headache., Disp: , Rfl:    amLODipine (NORVASC) 5 MG tablet, Take 1.5 tablets (7.5 mg total) by mouth daily., Disp: 135 tablet, Rfl: 3   anastrozole (ARIMIDEX) 1 MG tablet, Take 1 tablet (1 mg total) by mouth daily., Disp: 90 tablet, Rfl: 3   apixaban (ELIQUIS) 5 MG TABS tablet, Take 1 tablet (5 mg total) by mouth 2 (two) times daily., Disp: 180 tablet, Rfl: 2   Cholecalciferol (VITAMIN D3) 5000 units CAPS, Take 5,000 Units by mouth daily with supper., Disp: , Rfl:    dofetilide (TIKOSYN) 500 MCG capsule, TAKE 1 CAPSULE BY MOUTH TWICE A DAY, Disp: 180 capsule, Rfl: 2   ezetimibe (ZETIA) 10 MG tablet, Take 1 tablet (10 mg total) by mouth daily., Disp: 90 tablet, Rfl: 0   famotidine (PEPCID) 20 MG tablet, Take 1 tablet (20 mg total) by mouth 2 (two) times daily., Disp: 180 tablet, Rfl: 0   furosemide (LASIX) 20 MG tablet, Take 1 tablet (20 mg total) by mouth daily., Disp: 90 tablet, Rfl: 0   LORazepam (ATIVAN) 0.5 MG tablet, Take 0.5-1 tablets (0.25-0.5 mg total) by mouth daily as needed for anxiety., Disp: 20 tablet, Rfl: 2   olmesartan (BENICAR) 5 MG tablet, Take 1 tablet (5 mg total) by mouth daily., Disp: 90 tablet, Rfl: 0   potassium chloride SA (KLOR-CON M) 20 MEQ tablet, Take 1 tablet (20 mEq total) by mouth 2 (two) times daily., Disp:  180 tablet, Rfl: 3 Social History   Socioeconomic History   Marital status: Widowed    Spouse name: Not on file   Number of children: 2   Years of education: 44   Highest education level: Not on file  Occupational History   Occupation: Retired    Comment: Personnel officer  Tobacco Use   Smoking status: Never   Smokeless tobacco: Never   Tobacco comments:    Never smoke 06/30/22  Vaping Use   Vaping Use: Never used  Substance and Sexual Activity   Alcohol use: No    Alcohol/week: 0.0 standard drinks of alcohol   Drug use: No   Sexual activity: Not Currently    Birth  control/protection: Post-menopausal  Other Topics Concern   Not on file  Social History Narrative   Lives alone - children in [place] -    Caffeine use: Soda/tea daily   Good church support group   Social Determinants of Health   Financial Resource Strain: Low Risk  (04/30/2022)   Overall Financial Resource Strain (CARDIA)    Difficulty of Paying Living Expenses: Not very hard  Food Insecurity: No Food Insecurity (04/30/2022)   Hunger Vital Sign    Worried About Running Out of Food in the Last Year: Never true    [place] in the Last Year: Never true  Transportation Needs: No Transportation Needs (04/30/2022)   PRAPARE - Hydrologist (Medical): No    Lack of Transportation (Non-Medical): No  Physical Activity: Inactive (04/30/2022)   Exercise Vital Sign    Days of Exercise per Week: 0 days    Minutes of Exercise per Session: 0 min  Stress: Stress Concern Present (04/30/2022)   [place]    Feeling of Stress : To some extent  Social Connections: Moderately Integrated (04/30/2022)   Social Connection and Isolation Panel [NHANES]    Frequency of Communication with Friends and Family: More than three times a week    Frequency of Social Gatherings with Friends and Family: More than three times a week    Attends Religious Services: More than 4 times per year    Active Member of [employer] or Organizations: Yes    Attends Archivist Meetings: More than 4 times per year    Marital Status: Widowed  Intimate Partner Violence: Not At Risk (04/30/2022)   Humiliation, Afraid, Rape, and Kick questionnaire    Fear of Current or Ex-Partner: No    Emotionally Abused: No    Physically Abused: No    Sexually Abused: No   Family History  Problem Relation Age of Onset   Hypertension Mother    Hyperlipidemia Mother    Neurologic Disorder Mother 82       GB   Stroke Son     Stroke Maternal Uncle    Stroke Grandchild    Prostate cancer Brother     Objective: Office vital signs reviewed. BP (!) 145/66   Pulse 65   Temp 98.6 F (37 C)   Ht 5' 5"$  (1.651 m)   Wt 274 lb (124.3 kg)   SpO2 96%   BMI 45.60 kg/m   Physical Examination:  General: Awake, alert, morbidly obese, No acute distress HEENT:  sclera white, MMM Cardio: regular rate and rhythm, S1S2 heard, 1/6 SE murmurs appreciated Pulm: clear to auscultation bilaterally, no wheezes, rhonchi or rales; normal work of breathing on room air Extremities:  warm, well perfused, No edema, cyanosis or clubbing; +2 pulses bilaterally MSK: slow gait Psych: mood stable, speech normal.     01/22/2023   11:05 AM 07/21/2022   10:55 AM 06/05/2022    2:26 PM  Depression screen PHQ 2/9  Decreased Interest 0 1 1  Down, Depressed, Hopeless 1 1 1  $ PHQ - 2 Score 1 2 2  $ Altered sleeping 1 1 1  $ Tired, decreased energy 1 1 1  $ Change in appetite 0 1 1  Feeling bad or failure about yourself  1 1 0  Trouble concentrating 0 1 1  Moving slowly or fidgety/restless 0 0 0  Suicidal thoughts 0 0 0  PHQ-9 Score 4 7 6  $ Difficult doing work/chores Somewhat difficult Somewhat difficult Not difficult at all      01/22/2023   11:06 AM 07/21/2022   10:55 AM 06/05/2022    2:27 PM 05/28/2022    9:19 AM  GAD 7 : Generalized Anxiety Score  Nervous, Anxious, on Edge  1 1 1  $ Control/stop worrying 2 1 1 1  $ Worry too much - different things 2 1 1 1  $ Trouble relaxing 2 1 1 1  $ Restless 1 1 1 1  $ Easily annoyed or irritable 1 0 0 0  Afraid - awful might happen 1 1 1 1  $ Total GAD 7 Score  6 6 6  $ Anxiety Difficulty Somewhat difficult Somewhat difficult Somewhat difficult Not difficult at all    Assessment/ Plan: 77 y.o. female   Generalized anxiety disorder with panic attacks - Plan: LORazepam (ATIVAN) 0.5 MG tablet, Drug Screen 10 W/Conf, Se, CANCELED: ToxASSURE [place] 13 (MW), Urine  Controlled substance agreement signed - Plan:  Drug Screen 10 W/Conf, Se, CANCELED: ToxASSURE Select 13 (MW), Urine  PAF (paroxysmal atrial fibrillation) (HCC)  Essential hypertension  Pure hypercholesterolemia  Morbid obesity ([facility])  Tox assure canceled because she forgot to hold her urine prior to arrival.  Blood drug screen ordered for compliance with this Ativan prescription.  She is up-to-date on controlled substance contract.  A-fib was rate and rhythm controlled today.  Tolerating Eliquis without difficulty.  2-week supply given to her due to costs  Blood pressure is borderline for age.  We talked about diet and exercise to reduce cholesterol.  She has had statin intolerance in the past and is on Zetia  No orders of the defined types were placed in this encounter.  No orders of the defined types were placed in this encounter.    [name], DO [place] [phone]

## 2023-01-26 LAB — DRUG SCREEN 10 W/CONF, SERUM
Amphetamines, IA: NEGATIVE ng/mL
Barbiturates, IA: NEGATIVE ug/mL
Benzodiazepines, IA: NEGATIVE ng/mL
Cocaine & Metabolite, IA: NEGATIVE ng/mL
Methadone, IA: NEGATIVE ng/mL
Opiates, IA: NEGATIVE ng/mL
Oxycodones, IA: NEGATIVE ng/mL
Phencyclidine, IA: NEGATIVE ng/mL
Propoxyphene, IA: NEGATIVE ng/mL
THC(Marijuana) Metabolite, IA: NEGATIVE ng/mL

## 2023-01-30 ENCOUNTER — Other Ambulatory Visit: Payer: Self-pay | Admitting: Nurse Practitioner

## 2023-02-16 ENCOUNTER — Other Ambulatory Visit: Payer: Self-pay | Admitting: *Deleted

## 2023-02-16 DIAGNOSIS — Z800 Family history of malignant neoplasm of digestive organs: Secondary | ICD-10-CM | POA: Insufficient documentation

## 2023-02-16 DIAGNOSIS — K6289 Other specified diseases of anus and rectum: Secondary | ICD-10-CM | POA: Insufficient documentation

## 2023-02-16 DIAGNOSIS — E538 Deficiency of other specified B group vitamins: Secondary | ICD-10-CM | POA: Insufficient documentation

## 2023-02-16 DIAGNOSIS — M25562 Pain in left knee: Secondary | ICD-10-CM | POA: Diagnosis not present

## 2023-02-16 DIAGNOSIS — A0472 Enterocolitis due to Clostridium difficile, not specified as recurrent: Secondary | ICD-10-CM | POA: Insufficient documentation

## 2023-02-16 DIAGNOSIS — M1712 Unilateral primary osteoarthritis, left knee: Secondary | ICD-10-CM | POA: Diagnosis not present

## 2023-02-16 DIAGNOSIS — R1310 Dysphagia, unspecified: Secondary | ICD-10-CM | POA: Insufficient documentation

## 2023-02-16 DIAGNOSIS — E559 Vitamin D deficiency, unspecified: Secondary | ICD-10-CM | POA: Insufficient documentation

## 2023-02-16 DIAGNOSIS — R0602 Shortness of breath: Secondary | ICD-10-CM

## 2023-02-16 DIAGNOSIS — M1711 Unilateral primary osteoarthritis, right knee: Secondary | ICD-10-CM | POA: Diagnosis not present

## 2023-02-16 DIAGNOSIS — E785 Hyperlipidemia, unspecified: Secondary | ICD-10-CM

## 2023-02-16 DIAGNOSIS — Z860100 Personal history of colon polyps, unspecified: Secondary | ICD-10-CM | POA: Insufficient documentation

## 2023-02-16 DIAGNOSIS — K591 Functional diarrhea: Secondary | ICD-10-CM | POA: Insufficient documentation

## 2023-02-16 DIAGNOSIS — M25561 Pain in right knee: Secondary | ICD-10-CM | POA: Diagnosis not present

## 2023-02-16 DIAGNOSIS — I10 Essential (primary) hypertension: Secondary | ICD-10-CM

## 2023-02-16 DIAGNOSIS — M25569 Pain in unspecified knee: Secondary | ICD-10-CM | POA: Insufficient documentation

## 2023-02-16 MED ORDER — FUROSEMIDE 20 MG PO TABS: 20.0000 mg | ORAL_TABLET | Freq: Every day | ORAL | 1 refills | Status: DC

## 2023-02-16 MED ORDER — OLMESARTAN MEDOXOMIL 5 MG PO TABS: 5.0000 mg | ORAL_TABLET | Freq: Every day | ORAL | 1 refills | Status: DC

## 2023-02-16 MED ORDER — FAMOTIDINE 20 MG PO TABS: 20.0000 mg | ORAL_TABLET | Freq: Two times a day (BID) | ORAL | 1 refills | Status: DC

## 2023-02-16 MED ORDER — EZETIMIBE 10 MG PO TABS: 10.0000 mg | ORAL_TABLET | Freq: Every day | ORAL | 1 refills | Status: DC

## 2023-02-17 ENCOUNTER — Telehealth: Payer: Self-pay

## 2023-02-17 MED ORDER — AMLODIPINE BESYLATE 5 MG PO TABS: 7.5000 mg | ORAL_TABLET | Freq: Every day | ORAL | 1 refills | Status: DC

## 2023-02-17 MED ORDER — POTASSIUM CHLORIDE CRYS ER 20 MEQ PO TBCR: 20.0000 meq | EXTENDED_RELEASE_TABLET | Freq: Two times a day (BID) | ORAL | 1 refills | Status: DC

## 2023-02-17 NOTE — Telephone Encounter (Signed)
Med refills

## 2023-03-22 ENCOUNTER — Telehealth: Payer: Self-pay | Admitting: Family Medicine

## 2023-03-22 NOTE — Telephone Encounter (Signed)
Contacted [name] Collier to schedule their annual wellness visit. Appointment made for 03/30/2023.  Patient has [employer] ADVANTAGE (Calendar) can be scheduled sooner than  05/02/2023    Thank you,  Judeth Cornfield,  AMB Clinical Support [facility] AWV Program Direct Dial ??[phone]

## 2023-03-30 ENCOUNTER — Ambulatory Visit (INDEPENDENT_AMBULATORY_CARE_PROVIDER_SITE_OTHER): Payer: PPO

## 2023-03-30 VITALS — Ht 65.0 in | Wt 263.0 lb

## 2023-03-30 DIAGNOSIS — Z780 Asymptomatic menopausal state: Secondary | ICD-10-CM | POA: Diagnosis not present

## 2023-03-30 DIAGNOSIS — Z0000 Encounter for general adult medical examination without abnormal findings: Secondary | ICD-10-CM

## 2023-03-30 NOTE — Progress Notes (Signed)
Subjective:   [name] Collier is a 77 y.o. female who presents for Medicare Annual (Subsequent) preventive examination. I connected with  [name] Collier on 03/30/23 by a audio enabled telemedicine application and verified that I am speaking with the correct person using two identifiers.  Patient Location: Home  Provider Location: Home Office  I discussed the limitations of evaluation and management by telemedicine. The patient expressed understanding and agreed to proceed.  Review of Systems     Cardiac Risk Factors include: advanced age (>43men, >74 women);hypertension;dyslipidemia     Objective:    Today's Vitals   03/30/23 1316  Weight: 263 lb (119.3 kg)  Height: 5\' 5"  (1.651 m)   Body mass index is 43.77 kg/m.     03/30/2023    1:19 PM 04/30/2022    1:29 PM 06/29/2021   11:07 AM 04/25/2021   12:57 PM 02/11/2021    8:22 AM 01/07/2021    8:18 AM 01/01/2021    6:08 PM  Advanced Directives  Does Patient Have a Medical Advance Directive? Yes Yes No Yes No Yes Yes  Type of Estate agent of [place];Living will Healthcare Power of [place];Living will  Healthcare Power of [place];Living will  Healthcare Power of [place];Living will Healthcare Power of [place];Living will  Does patient want to make changes to medical advance directive?      No - Patient declined No - Patient declined  Copy of Healthcare Power of Attorney in Chart? No - copy requested No - copy requested  No - copy requested  No - copy requested   Would patient like information on creating a medical advance directive?   No - Patient declined  No - Patient declined      Current Medications (verified) Outpatient Encounter Medications as of 03/30/2023  Medication Sig   acetaminophen (TYLENOL) 500 MG tablet Take 1,000 mg by mouth every 6 (six) hours as needed for moderate pain or headache.   amLODipine (NORVASC) 5 MG tablet Take 1.5 tablets (7.5 mg total) by mouth daily.   anastrozole (ARIMIDEX)  1 MG tablet Take 1 tablet (1 mg total) by mouth daily.   apixaban (ELIQUIS) 5 MG TABS tablet Take 1 tablet (5 mg total) by mouth 2 (two) times daily.   Cholecalciferol (VITAMIN D3) 5000 units CAPS Take 5,000 Units by mouth daily with supper.   dofetilide (TIKOSYN) 500 MCG capsule TAKE 1 CAPSULE BY MOUTH TWICE A DAY   ezetimibe (ZETIA) 10 MG tablet Take 1 tablet (10 mg total) by mouth daily.   famotidine (PEPCID) 20 MG tablet Take 1 tablet (20 mg total) by mouth 2 (two) times daily.   furosemide (LASIX) 20 MG tablet Take 1 tablet (20 mg total) by mouth daily.   LORazepam (ATIVAN) 0.5 MG tablet Take 0.5-1 tablets (0.25-0.5 mg total) by mouth daily as needed for anxiety.   olmesartan (BENICAR) 5 MG tablet Take 1 tablet (5 mg total) by mouth daily.   potassium chloride SA (KLOR-CON M) 20 MEQ tablet Take 1 tablet (20 mEq total) by mouth 2 (two) times daily.   No facility-administered encounter medications on file as of 03/30/2023.    Allergies (verified) Augmentin [amoxicillin-pot clavulanate], Codeine, Prednisone, and Celecoxib   History: Past Medical History:  Diagnosis Date   Anxiety    Atrial fibrillation    Cancer 11/2020   left breast IMC   GERD (gastroesophageal reflux disease)    HTN (hypertension)    Hyperlipidemia    Personal history of radiation  therapy    TIA (transient ischemic attack)    Past Surgical History:  Procedure Laterality Date   BOTOX INJECTION N/A 12/22/2018   Procedure: BOTOX INJECTION INTO ANAL SPHINCTER;  Surgeon: Andria Meuse, MD;  Location: [facility];  Service: General;  Laterality: N/A;   BREAST BIOPSY Left 11/26/2020   BREAST LUMPECTOMY WITH RADIOACTIVE SEED AND SENTINEL LYMPH NODE BIOPSY Left 01/07/2021   Procedure: LEFT BREAST LUMPECTOMY WITH RADIOACTIVE SEED AND LEFT AXILLARY SENTINEL LYMPH NODE BIOPSY WITH BLUE DYE INJECTION;  Surgeon: Manus Rudd, MD;  Location: [facility];  Service: General;  Laterality: Left;  LMA WITH PECTORAL  BLOCK, BLUE DYE INJECTION   CARDIOVERSION N/A 01/04/2018   Procedure: CARDIOVERSION;  Surgeon: Lars Masson, MD;  Location: [facility];  Service: Cardiovascular;  Laterality: N/A;   CARDIOVERSION N/A 09/20/2018   Procedure: CARDIOVERSION;  Surgeon: Quintella Reichert, MD;  Location: [facility] ENDOSCOPY;  Service: Cardiovascular;  Laterality: N/A;   CARDIOVERSION N/A 10/27/2018   Procedure: CARDIOVERSION;  Surgeon: Jodelle Red, MD;  Location: [facility] ENDOSCOPY;  Service: Cardiovascular;  Laterality: N/A;   KNEE SURGERY Bilateral    Torn miniscus   RECTAL EXAM UNDER ANESTHESIA N/A 12/22/2018   Procedure: ANORECTAL  EXAM UNDER ANESTHESIA;  Surgeon: Andria Meuse, MD;  Location: [facility];  Service: General;  Laterality: N/A;   RECTOCELE REPAIR     TONSILLECTOMY     VAGINAL HYSTERECTOMY     Family History  Problem Relation Age of Onset   Hypertension Mother    Hyperlipidemia Mother    Neurologic Disorder Mother 39       GB   Stroke Son    Stroke Maternal Uncle    Stroke Grandchild    Prostate cancer Brother    Social History   Socioeconomic History   Marital status: Widowed    Spouse name: Not on file   Number of children: 2   Years of education: 53   Highest education level: Not on file  Occupational History   Occupation: Retired    Comment: Geophysical data processor  Tobacco Use   Smoking status: Never   Smokeless tobacco: Never   Tobacco comments:    Never smoke 06/30/22  Vaping Use   Vaping Use: Never used  Substance and Sexual Activity   Alcohol use: No    Alcohol/week: 0.0 standard drinks of alcohol   Drug use: No   Sexual activity: Not Currently    Birth control/protection: Post-menopausal  Other Topics Concern   Not on file  Social History Narrative   Lives alone - children in [place] and [place] -    Caffeine use: Soda/tea daily   Good church support group   Social Determinants of Health   Financial Resource Strain: Low Risk  (03/30/2023)   Overall  Financial Resource Strain (CARDIA)    Difficulty of Paying Living Expenses: Not hard at all  Food Insecurity: No Food Insecurity (03/30/2023)   Hunger Vital Sign    Worried About Running Out of Food in the Last Year: Never true    Ran Out of Food in the Last Year: Never true  Transportation Needs: No Transportation Needs (03/30/2023)   PRAPARE - Administrator, Civil Service (Medical): No    Lack of Transportation (Non-Medical): No  Physical Activity: Insufficiently Active (03/30/2023)   Exercise Vital Sign    Days of Exercise per Week: 3 days    Minutes of Exercise per Session: 30 min  Stress: No Stress Concern  Present (03/30/2023)   [employer] of Occupational Health - Occupational Stress Questionnaire    Feeling of Stress : Not at all  Social Connections: Moderately Integrated (03/30/2023)   Social Connection and Isolation Panel [NHANES]    Frequency of Communication with Friends and Family: More than three times a week    Frequency of Social Gatherings with Friends and Family: More than three times a week    Attends Religious Services: More than 4 times per year    Active Member of [employer] or Organizations: Yes    Attends Banker Meetings: More than 4 times per year    Marital Status: Widowed    Tobacco Counseling Counseling given: Not Answered Tobacco comments: Never smoke 06/30/22   Clinical Intake:  Pre-visit preparation completed: Yes  Pain : No/denies pain     Nutritional Risks: None Diabetes: No  How often do you need to have someone help you when you read instructions, pamphlets, or other written materials from your doctor or pharmacy?: 1 - Never  Diabetic?no   Interpreter Needed?: No  Information entered by :: Renie Ora, LPN   Activities of Daily Living    03/30/2023    1:20 PM 03/27/2023    4:20 PM  In your present state of health, do you have any difficulty performing the following activities:  Hearing? 0 0  Vision? 0 0   Difficulty concentrating or making decisions? 0 0  Walking or climbing stairs? 0 1  Dressing or bathing? 0 0  Doing errands, shopping? 0 0  Preparing Food and eating ? N N  Using the Toilet? N N  In the past six months, have you accidently leaked urine? N N  Do you have problems with loss of bowel control? N N  Managing your Medications? N N  Managing your Finances? N N  Housekeeping or managing your Housekeeping? N Y    Patient Care Team: Raliegh Ip, DO as PCP - General (Family Medicine) [name], MD as PCP - Cardiology (Cardiology) Hillis Range, MD (Inactive) as PCP - Electrophysiology (Cardiology) Serena Croissant, MD as Consulting Physician (Hematology and Oncology) Bernette Redbird, MD as Consulting Physician (Gastroenterology) Manus Rudd, MD as Consulting Physician (General Surgery) [facility] Lonie Peak, MD as Attending Physician (Radiation Oncology) Danella Maiers, [facility] as Pharmacist (Family Medicine) Randa Spike Kelton Pillar, LCSW as [facility] Care Management (Licensed Clinical Social Worker) Nita Sells, MD (Dermatology)  Indicate any recent Medical Services you may have received from other than [facility] providers in the past year (date may be approximate).     Assessment:   This is a routine wellness examination for Anjolie.  Hearing/Vision screen Vision Screening - Comments:: Wears rx glasses - up to date with routine eye exams with  Dr.groat   Dietary issues and exercise activities discussed: Current Exercise Habits: Home exercise routine, Type of exercise: walking, Time (Minutes): 30, Frequency (Times/Week): 3, Weekly Exercise (Minutes/Week): 90, Intensity: Mild, Exercise limited by: orthopedic condition(s)   Goals Addressed             This Visit's Progress    Exercise 150 min/wk Moderate Activity   On track      Depression Screen    03/30/2023    1:18 PM 01/22/2023   11:05 AM 07/21/2022   10:55 AM 06/05/2022     2:26 PM 05/28/2022    9:18 AM 04/30/2022    1:23 PM 04/28/2022    4:10 PM  PHQ 2/9 Scores  PHQ - 2 Score 0 1 2 2 2 2 2   PHQ- 9 Score  4 7 6 7 6 7     Fall Risk    03/30/2023    1:17 PM 03/27/2023    4:20 PM 01/22/2023   11:02 AM 07/21/2022   10:55 AM 06/05/2022    2:26 PM  Fall Risk   Falls in the past year? 0 0 0 1 1  Number falls in past yr: 0 0 0 0 0  Injury with Fall? 0 0 0 0 0  Risk for fall due to : No Fall Risks  No Fall Risks History of fall(s) History of fall(s)  Follow up Falls prevention discussed   Education provided Education provided    FALL RISK PREVENTION PERTAINING TO THE HOME:  Any stairs in or around the home? Yes  If so, are there any without handrails? No  Home free of loose throw rugs in walkways, pet beds, electrical cords, etc? Yes  Adequate lighting in your home to reduce risk of falls? Yes   ASSISTIVE DEVICES UTILIZED TO PREVENT FALLS:  Life alert? No  Use of a cane, walker or w/c? No  Grab bars in the bathroom? No  Shower chair or bench in shower? No  Elevated toilet seat or a handicapped toilet? No        03/30/2023    1:20 PM 04/30/2022    1:26 PM 04/24/2020    9:02 AM 04/24/2019    1:46 PM  6CIT Screen  What Year? 0 points 0 points 0 points 0 points  What month? 0 points 0 points 0 points 0 points  What time? 0 points 0 points 0 points 0 points  Count back from 20 0 points 0 points 2 points 0 points  Months in reverse 0 points 0 points 0 points 0 points  Repeat phrase 0 points 0 points 0 points 0 points  Total Score 0 points 0 points 2 points 0 points    Immunizations Immunization History  Administered Date(s) Administered   Moderna Sars-Covid-2 Vaccination 02/29/2020, 03/28/2020, 12/20/2020   Pneumococcal Conjugate-13 12/30/2016   Pneumococcal Polysaccharide-23 02/08/2018    TDAP status: Due, Education has been provided regarding the importance of this vaccine. Advised may receive this vaccine at local pharmacy or Health Dept.  Aware to provide a copy of the vaccination record if obtained from local pharmacy or Health Dept. Verbalized acceptance and understanding.  Flu Vaccine status: Declined, Education has been provided regarding the importance of this vaccine but patient still declined. Advised may receive this vaccine at local pharmacy or Health Dept. Aware to provide a copy of the vaccination record if obtained from local pharmacy or Health Dept. Verbalized acceptance and understanding.  Pneumococcal vaccine status: Up to date  Covid-19 vaccine status: Completed vaccines  Qualifies for Shingles Vaccine? Yes   Zostavax completed No   Shingrix Completed?: No.    Education has been provided regarding the importance of this vaccine. Patient has been advised to call insurance company to determine out of pocket expense if they have not yet received this vaccine. Advised may also receive vaccine at local pharmacy or Health Dept. Verbalized acceptance and understanding.  Screening Tests Health Maintenance  Topic Date Due   COVID-19 Vaccine (4 - 2023-24 season) 08/07/2022   Zoster Vaccines- Shingrix (1 of 2) 04/22/2023 (Originally 08/09/1965)   DEXA SCAN  04/02/2023   INFLUENZA VACCINE  07/08/2023   MAMMOGRAM  11/18/2023   Medicare Annual Wellness (AWV)  03/29/2024  COLONOSCOPY (Pts 45-44yrs Insurance coverage will need to be confirmed)  06/26/2024   Pneumonia Vaccine 38+ Years old  Completed   Hepatitis C Screening  Completed   HPV VACCINES  Aged Out   DTaP/Tdap/Td  Discontinued    Health Maintenance  Health Maintenance Due  Topic Date Due   COVID-19 Vaccine (4 - 2023-24 season) 08/07/2022    Colorectal cancer screening: No longer required.   Mammogram status: No longer required due to age.  Bone Density status: Completed 04/01/2021. Results reflect: Bone density results: OSTEOPOROSIS. Repeat every 04/01/2021 years.  Lung Cancer Screening: (Low Dose CT Chest recommended if Age 37-80 years, 30 pack-year  currently smoking OR have quit w/in 15years.) does not qualify.   Lung Cancer Screening Referral: n/a  Additional Screening:  Hepatitis C Screening: does not qualify; Completed 02/19/2020  Vision Screening: Recommended annual ophthalmology exams for early detection of glaucoma and other disorders of the eye. Is the patient up to date with their annual eye exam?  Yes  Who is the provider or what is the name of the office in which the patient attends annual eye exams? Dr.Groat  If pt is not established with a provider, would they like to be referred to a provider to establish care? No .   Dental Screening: Recommended annual dental exams for proper oral hygiene  Community Resource Referral / Chronic Care Management: CRR required this visit?  No   CCM required this visit?  No      Plan:     I have personally reviewed and noted the following in the patient's chart:   Medical and social history Use of alcohol, tobacco or illicit drugs  Current medications and supplements including opioid prescriptions. Patient is not currently taking opioid prescriptions. Functional ability and status Nutritional status Physical activity Advanced directives List of other physicians Hospitalizations, surgeries, and ER visits in previous 12 months Vitals Screenings to include cognitive, depression, and falls Referrals and appointments  In addition, I have reviewed and discussed with patient certain preventive protocols, quality metrics, and best practice recommendations. A written personalized care plan for preventive services as well as general preventive health recommendations were provided to patient.     Lorrene Reid, LPN   03/20/2439   Nurse Notes: Due Tdap Vaccine

## 2023-03-30 NOTE — Patient Instructions (Signed)
[name] Collier , Thank you for taking time to come for your Medicare Wellness Visit. I appreciate your ongoing commitment to your health goals. Please review the following plan we discussed and let me know if I can assist you in the future.   These are the goals we discussed:  Goals       Achieve a Healthy Weight-Obesity      Timeframe:  Long-Range Goal Priority:  High Start Date:                             Expected End Date:                       Follow Up Date 05/06/2023    - drink 6 to 8 glasses of water each day - eat 5 or 6 small meals each day - manage portion size - set goal weight    Why is this important?   When you are ready to manage your weight, have a plan and have set a goal, it is time to take action.  Taking small steps to change how you eat and exercise is a good place to start.    Notes:       AFIB, HTN PHARMD (pt-stated)      Current Barriers:  Unable to independently afford treatment regimen  Pharmacist Clinical Goal(s):  patient will verbalize ability to afford treatment regimen through collaboration with PharmD and provider.   Interventions: 1:1 collaboration with Raliegh Ip, DO regarding development and update of comprehensive plan of care as evidenced by provider attestation and co-signature Inter-disciplinary care team collaboration (see longitudinal plan of care) Comprehensive medication review performed; medication list updated in electronic medical record  Atrial Fibrillation/HTN:--follows with cardiology (we are assisting with medication assistance) Controlled; current rate/rhythm controlTIKOSYN; anticoagulant treatment: ELIQUIS CHADS2VASc score: at least 5 with h/o TIA Of note: was on xarelto in 2022 & had splenic bleed Home blood pressure, heart rate readings: within normal limits; patient checks at home Assessed patient finances. WILL APPLY FOR ASSISTANCE FOR ELIQUIS ONCE PATIENT HAS MET 3% OUT OF POCKET SPEND--patient has met  3%-->instructed her to bring in documents for [place] to apply for [employer] patient assistance   Patient Goals/Self-Care Activities patient will:  - take medications as prescribed as evidenced by patient report and record review check blood pressure DAILY, document, and provide at future appointments collaborate with provider on medication access solutions Barriers:  Unable to independently afford treatment regimen  Pharmacist Clinical Goal(s):  patient will verbalize ability to afford treatment regimen through collaboration with PharmD and provider.   Interventions: 1:1 collaboration with Raliegh Ip, DO regarding development and update of comprehensive plan of care as evidenced by provider attestation and co-signature Inter-disciplinary care team collaboration (see longitudinal plan of care) Comprehensive medication review performed; medication list updated in electronic medical record  Atrial Fibrillation/HTN:--follows with cardiology (we are assisting with medication assistance) Controlled; current rate/rhythm controlTIKOSYN; anticoagulant treatment: ELIQUIS CHADS2VASc score: at least  5 with h/o TIA Of note: was on xarelto in 2022 & had splenic bleed Home blood pressure, heart rate readings:  Assessed patient finances. WILL APPLY FOR ASSISTANCE FOR ELIQUIS ONCE PATIENT HAS MET 3% OUT OF POCKET SPEND;  LOOKING IN TO ASSISTANCE FOR TIKOSYN  Patient Goals/Self-Care Activities patient will:  - take medications as prescribed as evidenced by patient report and record review check blood pressure DAILY, document, and provide at future  appointments collaborate with provider on medication access solutions       Client will verbalize knowledge of self management of Hypertension as evidences by BP reading of 140/90 or less; or as defined by provider      Client will verbalize understanding of resources for managing BMI       Depressive Symptoms Identified. Manage Depression symptoms Manage  Grief  Symptoms (pt-stated)      Time Frame:  Short Term Goal Priority:  Medium Progress:  On Track  Start Date:  12/10/21 Expected End Date:  03/09/22  Follow Up Date:  03/30/22 at 2:00 PM  Depressive Symptoms Identified. Manage depression symptoms; Manage Grief symptoms  Patient Self Care Activities:  Attends all scheduled provider appointments Attends church or other social activities Performs ADL's independently  Patient Coping Strengths:  [employer]  Patient Self Care Deficits:  Some walking challenges Some pain issues Grief an depression issues  Patient Goals:  - spend time or talk with others at least 2 to 3 times per week - practice relaxation or meditation daily - keep a calendar with appointment dates  Follow Up Plan: LCSW to call client on 03/30/22 at 2:00 PM to assess client needs       Exercise 150 min/wk Moderate Activity        This is a list of the screening recommended for you and due dates:  Health Maintenance  Topic Date Due   COVID-19 Vaccine (4 - 2023-24 season) 08/07/2022   Zoster (Shingles) Vaccine (1 of 2) 04/22/2023*   DEXA scan (bone density measurement)  04/02/2023   Flu Shot  07/08/2023   Mammogram  11/18/2023   Medicare Annual Wellness Visit  03/29/2024   Colon Cancer Screening  06/26/2024   Pneumonia Vaccine  Completed   Hepatitis C Screening: USPSTF Recommendation to screen - Ages 18-79 yo.  Completed   HPV Vaccine  Aged Out   DTaP/Tdap/Td vaccine  Discontinued  *Topic was postponed. The date shown is not the original due date.    Advanced directives: Please bring a copy of your health care power of attorney and living will to the office to be added to your chart at your convenience.   Conditions/risks identified: Aim for 30 minutes of exercise or brisk walking, 6-8 glasses of water, and 5 servings of fruits and vegetables each day.   Next appointment: Follow up in one year for your annual wellness visit     Preventive Care 65 Years and Older, Female Preventive care refers to lifestyle choices and visits with your health care provider that can promote health and wellness. What does preventive care include? A yearly physical exam. This is also called an annual well check. Dental exams once or twice a year. Routine eye exams. Ask your health care provider how often you should have your eyes checked. Personal lifestyle choices, including: Daily care of your teeth and gums. Regular physical activity. Eating a healthy diet. Avoiding tobacco and drug use. Limiting alcohol use. Practicing safe sex. Taking low-dose aspirin every day. Taking vitamin and mineral supplements as recommended by your health care provider. What happens during an annual well check? The services and screenings done by your health care provider during your annual well check will depend on your age, overall health, lifestyle risk factors, and family history of disease. Counseling  Your health care provider may ask you questions about your: Alcohol use. Tobacco use. Drug use. Emotional well-being. Home and relationship well-being. Sexual activity. Eating habits. History  of falls. Memory and ability to understand (cognition). Work and work Astronomer. Reproductive health. Screening  You may have the following tests or measurements: Height, weight, and BMI. Blood pressure. Lipid and cholesterol levels. These may be checked every 5 years, or more frequently if you are over 71 years old. Skin check. Lung cancer screening. You may have this screening every year starting at age 62 if you have a 30-pack-year history of smoking and currently smoke or have quit within the past 15 years. Fecal occult blood test (FOBT) of the stool. You may have this test every year starting at age 28. Flexible sigmoidoscopy or colonoscopy. You may have a sigmoidoscopy every 5 years or a colonoscopy every 10 years starting at age  87. Hepatitis C blood test. Hepatitis B blood test. Sexually transmitted disease (STD) testing. Diabetes screening. This is done by checking your blood sugar (glucose) after you have not eaten for a while (fasting). You may have this done every 1-3 years. Bone density scan. This is done to screen for osteoporosis. You may have this done starting at age 42. Mammogram. This may be done every 1-2 years. Talk to your health care provider about how often you should have regular mammograms. Talk with your health care provider about your test results, treatment options, and if necessary, the need for more tests. Vaccines  Your health care provider may recommend certain vaccines, such as: Influenza vaccine. This is recommended every year. Tetanus, diphtheria, and acellular pertussis (Tdap, Td) vaccine. You may need a Td booster every 10 years. Zoster vaccine. You may need this after age 28. Pneumococcal 13-valent conjugate (PCV13) vaccine. One dose is recommended after age 37. Pneumococcal polysaccharide (PPSV23) vaccine. One dose is recommended after age 52. Talk to your health care provider about which screenings and vaccines you need and how often you need them. This information is not intended to replace advice given to you by your health care provider. Make sure you discuss any questions you have with your health care provider. Document Released: 12/20/2015 Document Revised: 08/12/2016 Document Reviewed: 09/24/2015 Elsevier Interactive Patient Education  2017 [employer].  Fall Prevention in the Home Falls can cause injuries. They can happen to people of all ages. There are many things you can do to make your home safe and to help prevent falls. What can I do on the outside of my home? Regularly fix the edges of walkways and driveways and fix any cracks. Remove anything that might make you trip as you walk through a door, such as a raised step or threshold. Trim any bushes or trees on the  path to your home. Use bright outdoor lighting. Clear any walking paths of anything that might make someone trip, such as rocks or tools. Regularly check to see if handrails are loose or broken. Make sure that both sides of any steps have handrails. Any raised decks and porches should have guardrails on the edges. Have any leaves, snow, or ice cleared regularly. Use sand or salt on walking paths during winter. Clean up any spills in your garage right away. This includes oil or grease spills. What can I do in the bathroom? Use night lights. Install grab bars by the toilet and in the tub and shower. Do not use towel bars as grab bars. Use non-skid mats or decals in the tub or shower. If you need to sit down in the shower, use a plastic, non-slip stool. Keep the floor dry. Clean up any water that spills on  the floor as soon as it happens. Remove soap buildup in the tub or shower regularly. Attach bath mats securely with double-sided non-slip rug tape. Do not have throw rugs and other things on the floor that can make you trip. What can I do in the bedroom? Use night lights. Make sure that you have a light by your bed that is easy to reach. Do not use any sheets or blankets that are too big for your bed. They should not hang down onto the floor. Have a firm chair that has side arms. You can use this for support while you get dressed. Do not have throw rugs and other things on the floor that can make you trip. What can I do in the kitchen? Clean up any spills right away. Avoid walking on wet floors. Keep items that you use a lot in easy-to-reach places. If you need to reach something above you, use a strong step stool that has a grab bar. Keep electrical cords out of the way. Do not use floor polish or wax that makes floors slippery. If you must use wax, use non-skid floor wax. Do not have throw rugs and other things on the floor that can make you trip. What can I do with my stairs? Do not  leave any items on the stairs. Make sure that there are handrails on both sides of the stairs and use them. Fix handrails that are broken or loose. Make sure that handrails are as long as the stairways. Check any carpeting to make sure that it is firmly attached to the stairs. Fix any carpet that is loose or worn. Avoid having throw rugs at the top or bottom of the stairs. If you do have throw rugs, attach them to the floor with carpet tape. Make sure that you have a light switch at the top of the stairs and the bottom of the stairs. If you do not have them, ask someone to add them for you. What else can I do to help prevent falls? Wear shoes that: Do not have high heels. Have rubber bottoms. Are comfortable and fit you well. Are closed at the toe. Do not wear sandals. If you use a stepladder: Make sure that it is fully opened. Do not climb a closed stepladder. Make sure that both sides of the stepladder are locked into place. Ask someone to hold it for you, if possible. Clearly mark and make sure that you can see: Any grab bars or handrails. First and last steps. Where the edge of each step is. Use tools that help you move around (mobility aids) if they are needed. These include: Canes. Walkers. Scooters. Crutches. Turn on the lights when you go into a dark area. Replace any light bulbs as soon as they burn out. Set up your furniture so you have a clear path. Avoid moving your furniture around. If any of your floors are uneven, fix them. If there are any pets around you, be aware of where they are. Review your medicines with your doctor. Some medicines can make you feel dizzy. This can increase your chance of falling. Ask your doctor what other things that you can do to help prevent falls. This information is not intended to replace advice given to you by your health care provider. Make sure you discuss any questions you have with your health care provider. Document Released:  09/19/2009 Document Revised: 04/30/2016 Document Reviewed: 12/28/2014 Elsevier Interactive Patient Education  2017 [employer].

## 2023-04-12 ENCOUNTER — Inpatient Hospital Stay (HOSPITAL_COMMUNITY)
Admission: RE | Admit: 2023-04-12 | Discharge: 2023-04-12 | Disposition: A | Discharge status: Home / Self Care | Payer: PPO | Source: Ambulatory Visit | Attending: Internal Medicine | Admitting: Internal Medicine

## 2023-04-12 ENCOUNTER — Ambulatory Visit (HOSPITAL_COMMUNITY)
Admission: RE | Admit: 2023-04-12 | Discharge: 2023-04-12 | Disposition: A | Discharge status: Home / Self Care | Payer: PPO | Source: Ambulatory Visit | Attending: Internal Medicine | Admitting: Internal Medicine

## 2023-04-12 VITALS — BP 158/88 | HR 65 | Ht 65.0 in | Wt 254.2 lb

## 2023-04-12 DIAGNOSIS — Z7901 Long term (current) use of anticoagulants: Secondary | ICD-10-CM | POA: Diagnosis not present

## 2023-04-12 DIAGNOSIS — I371 Nonrheumatic pulmonary valve insufficiency: Secondary | ICD-10-CM | POA: Diagnosis not present

## 2023-04-12 DIAGNOSIS — I081 Rheumatic disorders of both mitral and tricuspid valves: Secondary | ICD-10-CM | POA: Diagnosis not present

## 2023-04-12 DIAGNOSIS — I10 Essential (primary) hypertension: Secondary | ICD-10-CM | POA: Insufficient documentation

## 2023-04-12 DIAGNOSIS — Z8249 Family history of ischemic heart disease and other diseases of the circulatory system: Secondary | ICD-10-CM | POA: Diagnosis not present

## 2023-04-12 DIAGNOSIS — I4819 Other persistent atrial fibrillation: Secondary | ICD-10-CM | POA: Diagnosis not present

## 2023-04-12 DIAGNOSIS — Z79899 Other long term (current) drug therapy: Secondary | ICD-10-CM | POA: Insufficient documentation

## 2023-04-12 DIAGNOSIS — E785 Hyperlipidemia, unspecified: Secondary | ICD-10-CM | POA: Insufficient documentation

## 2023-04-12 DIAGNOSIS — Z823 Family history of stroke: Secondary | ICD-10-CM | POA: Insufficient documentation

## 2023-04-12 DIAGNOSIS — D6869 Other thrombophilia: Secondary | ICD-10-CM

## 2023-04-12 DIAGNOSIS — R0602 Shortness of breath: Secondary | ICD-10-CM

## 2023-04-12 DIAGNOSIS — R9431 Abnormal electrocardiogram [ECG] [EKG]: Secondary | ICD-10-CM | POA: Diagnosis not present

## 2023-04-12 NOTE — Progress Notes (Signed)
Primary Care Physician: Raliegh Ip, DO Referring Physician: Dr. Mayford Knife Cardiologist: Dr. Antoine Poche EP: Dr. Gar Collier is a 77 y.o. female with a h/o afib, HTN, hyperlipidemia and TIA. She is in the afib clinic for f/u of Tikosyn use. She feels well with NSR today. She did have a mechanical fall a couple of weeks ago and was placed on metoprolol 25 mg bid for afib secondary to the fall. She did convert in a couple of days and then stopped BB with issues with hypotension and bradycardia. Now however, her BP has been running higher, around 150-160 systolic.  Continues on xarelto 20 mg daily with a CHA2DS2VASc of at least 5, without any signs of bleeding.   F/u with afib clinic 06/19/21. She is staying in SR with Tikosyn. She has had an lumpectomy and radiation. Is finished with treatment now and is on daily. Arimidex. She is pending a colonoscopy and has received instruction re holding her DOAC. Reminded to take Tikosyn with a sip of water.    F/u in afib clinic, 07/22/21,  after a hemo peritoneal bleed around her spleen following a colonoscopy. She had minimal cbc drop and was observed x 2 days in the hospital. It did resolve without any intervention. She was switched form xarelto to eliquis on d/c.   F/u in afib clinic, 12/30/21 for ongoing  Tikosyn use. She is continuing  in SR. No issues with anticoagulation. States that she has has had some recent stomach upset with diarrhea. Will check labs today.   F/u 06/30/22 for tikosyn surveillance. Ekg shows SR. No afib to report. Has had some issues with L more so than Rt LE cellulitis. Has recently had her 3rd round of antibiotics. Started with an injury to LL leg. Recently fitted with support hose by PCP.   F/u in afib clinic,12/29/22 for Tikosyn surveillance. She is in SR with stable qt. She had an upper respiratory infection in December but is now resolved. No complaints voiced today.   F/u in afib clinic, 04/12/23. She is currently  in SR. She feels like she has been back in Afib for the past week. She feels tired and short of breath and sometimes feels her heart flipping. She has not checked her heart rhythm at home.   Today, she denies symptoms of palpitations, chest pain, shortness of breath, orthopnea, PND, lower extremity edema, dizziness, presyncope, syncope, or neurologic sequela. The patient is tolerating medications without difficulties and is otherwise without complaint today.   Past Medical History:  Diagnosis Date   Anxiety    Atrial fibrillation (HCC)    Cancer (HCC) 11/2020   left breast IMC   GERD (gastroesophageal reflux disease)    HTN (hypertension)    Hyperlipidemia    Personal history of radiation therapy    TIA (transient ischemic attack)    Past Surgical History:  Procedure Laterality Date   BOTOX INJECTION N/A 12/22/2018   Procedure: BOTOX INJECTION INTO ANAL SPHINCTER;  Surgeon: Andria Meuse, MD;  Location: [facility];  Service: General;  Laterality: N/A;   BREAST BIOPSY Left 11/26/2020   BREAST LUMPECTOMY WITH RADIOACTIVE SEED AND SENTINEL LYMPH NODE BIOPSY Left 01/07/2021   Procedure: LEFT BREAST LUMPECTOMY WITH RADIOACTIVE SEED AND LEFT AXILLARY SENTINEL LYMPH NODE BIOPSY WITH BLUE DYE INJECTION;  Surgeon: Manus Rudd, MD;  Location: [facility];  Service: General;  Laterality: Left;  LMA WITH PECTORAL BLOCK, BLUE DYE INJECTION   CARDIOVERSION N/A 01/04/2018  Procedure: CARDIOVERSION;  Surgeon: Lars Masson, MD;  Location: [facility] END[facility];  Service: Cardiovascular;  Laterality: N/A;   CARDIOVERSION N/A 09/20/2018   Procedure: CARDIOVERSION;  Surgeon: Quintella Reichert, MD;  Location: [facility] ENDOSCOPY;  Service: Cardiovascular;  Laterality: N/A;   CARDIOVERSION N/A 10/27/2018   Procedure: CARDIOVERSION;  Surgeon: Jodelle Red, MD;  Location: [facility] ENDOSCOPY;  Service: Cardiovascular;  Laterality: N/A;   KNEE SURGERY Bilateral    Torn miniscus   RECTAL EXAM UNDER  ANESTHESIA N/A 12/22/2018   Procedure: ANORECTAL  EXAM UNDER ANESTHESIA;  Surgeon: Andria Meuse, MD;  Location: [facility];  Service: General;  Laterality: N/A;   RECTOCELE REPAIR     TONSILLECTOMY     VAGINAL HYSTERECTOMY      Current Outpatient Medications  Medication Sig Dispense Refill   acetaminophen (TYLENOL) 500 MG tablet Take 1,000 mg by mouth every 6 (six) hours as needed for moderate pain or headache.     amLODipine (NORVASC) 5 MG tablet Take 1.5 tablets (7.5 mg total) by mouth daily. 135 tablet 1   anastrozole (ARIMIDEX) 1 MG tablet Take 1 tablet (1 mg total) by mouth daily. 90 tablet 3   apixaban (ELIQUIS) 5 MG TABS tablet Take 1 tablet (5 mg total) by mouth 2 (two) times daily. 180 tablet 2   Cholecalciferol (VITAMIN D3) 5000 units CAPS Take 5,000 Units by mouth daily with supper.     dofetilide (TIKOSYN) 500 MCG capsule TAKE 1 CAPSULE BY MOUTH TWICE A DAY 180 capsule 2   ezetimibe (ZETIA) 10 MG tablet Take 1 tablet (10 mg total) by mouth daily. 90 tablet 1   famotidine (PEPCID) 20 MG tablet Take 1 tablet (20 mg total) by mouth 2 (two) times daily. 180 tablet 1   furosemide (LASIX) 20 MG tablet Take 1 tablet (20 mg total) by mouth daily. 90 tablet 1   LORazepam (ATIVAN) 0.5 MG tablet Take 0.5-1 tablets (0.25-0.5 mg total) by mouth daily as needed for anxiety. 20 tablet 2   olmesartan (BENICAR) 5 MG tablet Take 1 tablet (5 mg total) by mouth daily. 90 tablet 1   potassium chloride SA (KLOR-CON M) 20 MEQ tablet Take 1 tablet (20 mEq total) by mouth 2 (two) times daily. 180 tablet 1   No current facility-administered medications for this visit.    Allergies  Allergen Reactions   Augmentin [Amoxicillin-Pot Clavulanate] Other (See Comments)    c-diff Has patient had a PCN reaction causing immediate rash, facial/tongue/throat swelling, SOB or lightheadedness with hypotension: No Has patient had a PCN reaction causing severe rash involving mucus membranes or skin necrosis:  No Has patient had a PCN reaction that required hospitalization: No Has patient had a PCN reaction occurring within the last 10 years: Yes If all of the above answers are "NO", then may proceed with Cephalosporin use.    Codeine Palpitations and Rash   Prednisone Other (See Comments)    Jittery, red in the face   Celecoxib Other (See Comments)    Social History   Socioeconomic History   Marital status: Widowed    Spouse name: Not on file   Number of children: 2   Years of education: 10   Highest education level: Not on file  Occupational History   Occupation: Retired    Comment: Geophysical data processor  Tobacco Use   Smoking status: Never   Smokeless tobacco: Never   Tobacco comments:    Never smoke 06/30/22  Vaping Use   Vaping Use: Never used  Substance and Sexual Activity   Alcohol use: No    Alcohol/week: 0.0 standard drinks of alcohol   Drug use: No   Sexual activity: Not Currently    Birth control/protection: Post-menopausal  Other Topics Concern   Not on file  Social History Narrative   Lives alone - children in [place] and [place] -    Caffeine use: Soda/tea daily   Good church support group   Social Determinants of Health   Financial Resource Strain: Low Risk  (03/30/2023)   Overall Financial Resource Strain (CARDIA)    Difficulty of Paying Living Expenses: Not hard at all  Food Insecurity: No Food Insecurity (03/30/2023)   Hunger Vital Sign    Worried About Running Out of Food in the Last Year: Never true    Ran Out of Food in the Last Year: Never true  Transportation Needs: No Transportation Needs (03/30/2023)   PRAPARE - Administrator, Civil Service (Medical): No    Lack of Transportation (Non-Medical): No  Physical Activity: Insufficiently Active (03/30/2023)   Exercise Vital Sign    Days of Exercise per Week: 3 days    Minutes of Exercise per Session: 30 min  Stress: No Stress Concern Present (03/30/2023)   [employer] of Occupational  Health - Occupational Stress Questionnaire    Feeling of Stress : Not at all  Social Connections: Moderately Integrated (03/30/2023)   Social Connection and Isolation Panel [NHANES]    Frequency of Communication with Friends and Family: More than three times a week    Frequency of Social Gatherings with Friends and Family: More than three times a week    Attends Religious Services: More than 4 times per year    Active Member of [employer] or Organizations: Yes    Attends Banker Meetings: More than 4 times per year    Marital Status: Widowed  Intimate Partner Violence: Not At Risk (03/30/2023)   Humiliation, Afraid, Rape, and Kick questionnaire    Fear of Current or Ex-Partner: No    Emotionally Abused: No    Physically Abused: No    Sexually Abused: No    Family History  Problem Relation Age of Onset   Hypertension Mother    Hyperlipidemia Mother    Neurologic Disorder Mother 29       GB   Stroke Son    Stroke Maternal Uncle    Stroke Grandchild    Prostate cancer Brother     ROS- All systems are reviewed and negative except as per the HPI above  Physical Exam: There were no vitals filed for this visit.   Wt Readings from Last 3 Encounters:  03/30/23 119.3 kg  01/22/23 124.3 kg  12/29/22 124 kg    Labs: Lab Results  Component Value Date   NA 144 01/20/2023   K 4.5 01/20/2023   CL 105 01/20/2023   CO2 24 01/20/2023   GLUCOSE 104 (H) 01/20/2023   BUN 13 01/20/2023   CREATININE 0.80 01/20/2023   CALCIUM 9.6 01/20/2023   PHOS 4.1 06/30/2021   MG 1.9 12/29/2022   Lab Results  Component Value Date   INR 1.7 (H) 06/29/2021   Lab Results  Component Value Date   CHOL 194 01/20/2023   HDL 61 01/20/2023   LDLCALC 111 (H) 01/20/2023   TRIG 125 01/20/2023    GEN- The patient is well appearing, alert and oriented x 3 today.   Head- normocephalic, atraumatic Eyes-  Sclera clear, conjunctiva pink Ears-  hearing intact Oropharynx- clear Neck- supple, no  JVP Lymph- no cervical lymphadenopathy Lungs- Clear to ausculation bilaterally, normal work of breathing Heart- Regular rate and rhythm, no murmurs, rubs or gallops, PMI not laterally displaced GI- soft, NT, ND, + BS Extremities- no clubbing, cyanosis, or edema MS- no significant deformity or atrophy Skin- no rash or lesion Psych- euthymic mood, full affect Neuro- strength and sensation are intact   EKG - Vent. rate 65 BPM PR interval 200 ms QRS duration 82 ms QT/QTcB 428/445 ms P-R-T axes 70 -20 51 Sinus rhythm with frequent Premature ventricular complexes Cannot rule out Anterior infarct , age undetermined Abnormal ECG When compared with ECG of 12-Apr-2023 16:06, PREVIOUS ECG IS PRESENT  ECHO 11/17/2017: - Left ventricle: The cavity size was normal. There was mild    concentric hypertrophy. Systolic function was normal. The    estimated ejection fraction was in the range of 60% to 65%. Wall    motion was normal; there were no regional wall motion    abnormalities.  - Aortic valve: Transvalvular velocity was within the normal range.    There was no stenosis. There was no regurgitation.  - Aorta: Ascending aortic diameter: 38 mm (S).  - Ascending aorta: The ascending aorta was mildly dilated.  - Mitral valve: Mildly calcified annulus. Transvalvular velocity    was within the normal range. There was no evidence for stenosis.    There was mild regurgitation.  - Left atrium: The atrium was mildly dilated.  - Right ventricle: The cavity size was normal. Wall thickness was    normal. Systolic function was normal.  - Atrial septum: No defect or patent foramen ovale was identified.  - Tricuspid valve: There was mild-moderate regurgitation.  - Pulmonic valve: There was moderate regurgitation.  - Pulmonary arteries: Systolic pressure was severely increased. PA    peak pressure: 69 mm Hg (S).    Assessment and Plan: 1. Persistent afib - on Tikosyn since 10/2018.   She is in SR  today. Unclear if she is going in and out of Afib over the past week or so. I do note increased ectopy on ECG compared to previous but do not appreciate frequent ectopy on physical exam. We will place cardiac monitor for 2 weeks to assess Afib / ectopy burden.  No signs of fluid overload today to suggest heart failure.   She has changed her diet over the past 6 weeks to cut out sugar and bread.  Continue eliquis 5 mg bid for CHA2DS2VASc of at least 5   2. HTN Elevated here but stable at home   F/u with afib clinic in 1 month.  Lake Bells, PA-C Af[facility] [address] [place], [place] [address] [phone]

## 2023-04-13 DIAGNOSIS — I4819 Other persistent atrial fibrillation: Secondary | ICD-10-CM | POA: Insufficient documentation

## 2023-04-13 DIAGNOSIS — M1711 Unilateral primary osteoarthritis, right knee: Secondary | ICD-10-CM | POA: Diagnosis not present

## 2023-04-13 DIAGNOSIS — M1712 Unilateral primary osteoarthritis, left knee: Secondary | ICD-10-CM | POA: Diagnosis not present

## 2023-04-28 ENCOUNTER — Other Ambulatory Visit: Payer: Self-pay | Admitting: Hematology and Oncology

## 2023-04-29 DIAGNOSIS — R0602 Shortness of breath: Secondary | ICD-10-CM | POA: Diagnosis not present

## 2023-05-04 DIAGNOSIS — R1084 Generalized abdominal pain: Secondary | ICD-10-CM | POA: Diagnosis not present

## 2023-05-04 DIAGNOSIS — R310 Gross hematuria: Secondary | ICD-10-CM | POA: Diagnosis not present

## 2023-05-12 ENCOUNTER — Ambulatory Visit (HOSPITAL_COMMUNITY)
Admission: RE | Admit: 2023-05-12 | Discharge: 2023-05-12 | Disposition: A | Discharge status: Home / Self Care | Payer: PPO | Source: Ambulatory Visit | Attending: Internal Medicine | Admitting: Internal Medicine

## 2023-05-12 VITALS — BP 166/80 | HR 63 | Ht 65.0 in | Wt 250.4 lb

## 2023-05-12 DIAGNOSIS — Z5181 Encounter for therapeutic drug level monitoring: Secondary | ICD-10-CM | POA: Diagnosis not present

## 2023-05-12 DIAGNOSIS — I10 Essential (primary) hypertension: Secondary | ICD-10-CM | POA: Insufficient documentation

## 2023-05-12 DIAGNOSIS — E785 Hyperlipidemia, unspecified: Secondary | ICD-10-CM | POA: Insufficient documentation

## 2023-05-12 DIAGNOSIS — I4891 Unspecified atrial fibrillation: Secondary | ICD-10-CM

## 2023-05-12 DIAGNOSIS — Z8673 Personal history of transient ischemic attack (TIA), and cerebral infarction without residual deficits: Secondary | ICD-10-CM | POA: Insufficient documentation

## 2023-05-12 DIAGNOSIS — Z79899 Other long term (current) drug therapy: Secondary | ICD-10-CM | POA: Diagnosis not present

## 2023-05-12 DIAGNOSIS — D6869 Other thrombophilia: Secondary | ICD-10-CM

## 2023-05-12 DIAGNOSIS — Z7901 Long term (current) use of anticoagulants: Secondary | ICD-10-CM | POA: Insufficient documentation

## 2023-05-12 DIAGNOSIS — I4819 Other persistent atrial fibrillation: Secondary | ICD-10-CM | POA: Insufficient documentation

## 2023-05-12 LAB — BASIC METABOLIC PANEL
Anion gap: 8 (ref 5–15)
BUN: 18 mg/dL (ref 8–23)
CO2: 24 mmol/L (ref 22–32)
Calcium: 9.7 mg/dL (ref 8.9–10.3)
Chloride: 107 mmol/L (ref 98–111)
Creatinine, Ser: 0.91 mg/dL (ref 0.44–1.00)
GFR, Estimated: >60 mL/min (ref >60)
Glucose, Bld: 106 mg/dL — ABNORMAL HIGH (ref 70–99)
Potassium: 4.3 mmol/L (ref 3.5–5.1)
Sodium: 139 mmol/L (ref 135–145)

## 2023-05-12 LAB — MAGNESIUM: Magnesium: 2 mg/dL (ref 1.7–2.4)

## 2023-05-12 NOTE — Progress Notes (Signed)
Primary Care Physician: Raliegh Ip, DO Referring Physician: Dr. Mayford Knife Cardiologist: Dr. Antoine Poche EP: Dr. Gar Gibbon is a 77 y.o. female with a h/o afib, HTN, hyperlipidemia and TIA. She is in the afib clinic for f/u of Tikosyn use. She feels well with NSR today. She did have a mechanical fall a couple of weeks ago and was placed on metoprolol 25 mg bid for afib secondary to the fall. She did convert in a couple of days and then stopped BB with issues with hypotension and bradycardia. Now however, her BP has been running higher, around 150-160 systolic.  Continues on xarelto 20 mg daily with a CHA2DS2VASc of at least 5, without any signs of bleeding.   F/u with afib clinic 06/19/21. She is staying in SR with Tikosyn. She has had an lumpectomy and radiation. Is finished with treatment now and is on daily. Arimidex. She is pending a colonoscopy and has received instruction re holding her DOAC. Reminded to take Tikosyn with a sip of water.    F/u in afib clinic, 07/22/21,  after a hemo peritoneal bleed around her spleen following a colonoscopy. She had minimal cbc drop and was observed x 2 days in the hospital. It did resolve without any intervention. She was switched form xarelto to eliquis on d/c.   F/u in afib clinic, 12/30/21 for ongoing  Tikosyn use. She is continuing  in SR. No issues with anticoagulation. States that she has has had some recent stomach upset with diarrhea. Will check labs today.   F/u 06/30/22 for tikosyn surveillance. Ekg shows SR. No afib to report. Has had some issues with L more so than Rt LE cellulitis. Has recently had her 3rd round of antibiotics. Started with an injury to LL leg. Recently fitted with support hose by PCP.   F/u in afib clinic,12/29/22 for Tikosyn surveillance. She is in SR with stable qt. She had an upper respiratory infection in December but is now resolved. No complaints voiced today.   F/u in afib clinic, 04/12/23. She is currently  in SR. She feels like she has been back in Afib for the past week. She feels tired and short of breath and sometimes feels her heart flipping. She has not checked her heart rhythm at home.   F/u in Afib clinic, 05/12/23. She is currently in NSR. She wore cardiac monitor which showed 8% PVCs and no Afib. Since last office visit, she feels her heart flipping sometimes but perhaps not as frequent as prior. No missed doses of Tikosyn or anticoagulant.  Today, she denies symptoms of palpitations, chest pain, shortness of breath, orthopnea, PND, lower extremity edema, dizziness, presyncope, syncope, or neurologic sequela. The patient is tolerating medications without difficulties and is otherwise without complaint today.   Past Medical History:  Diagnosis Date   Anxiety    Atrial fibrillation (HCC)    Cancer (HCC) 11/2020   left breast IMC   GERD (gastroesophageal reflux disease)    HTN (hypertension)    Hyperlipidemia    Personal history of radiation therapy    TIA (transient ischemic attack)    Past Surgical History:  Procedure Laterality Date   BOTOX INJECTION N/A 12/22/2018   Procedure: BOTOX INJECTION INTO ANAL SPHINCTER;  Surgeon: Andria Meuse, MD;  Location: [facility];  Service: General;  Laterality: N/A;   BREAST BIOPSY Left 11/26/2020   BREAST LUMPECTOMY WITH RADIOACTIVE SEED AND SENTINEL LYMPH NODE BIOPSY Left 01/07/2021   Procedure: LEFT BREAST  LUMPECTOMY WITH RADIOACTIVE SEED AND LEFT AXILLARY SENTINEL LYMPH NODE BIOPSY WITH BLUE DYE INJECTION;  Surgeon: Manus Rudd, MD;  Location: [facility];  Service: General;  Laterality: Left;  LMA WITH PECTORAL BLOCK, BLUE DYE INJECTION   CARDIOVERSION N/A 01/04/2018   Procedure: CARDIOVERSION;  Surgeon: Lars Masson, MD;  Location: [facility] ENDOSCOPY;  Service: Cardiovascular;  Laterality: N/A;   CARDIOVERSION N/A 09/20/2018   Procedure: CARDIOVERSION;  Surgeon: Quintella Reichert, MD;  Location: [facility] ENDOSCOPY;  Service:  Cardiovascular;  Laterality: N/A;   CARDIOVERSION N/A 10/27/2018   Procedure: CARDIOVERSION;  Surgeon: Jodelle Red, MD;  Location: [facility] ENDOSCOPY;  Service: Cardiovascular;  Laterality: N/A;   KNEE SURGERY Bilateral    Torn miniscus   RECTAL EXAM UNDER ANESTHESIA N/A 12/22/2018   Procedure: ANORECTAL  EXAM UNDER ANESTHESIA;  Surgeon: Andria Meuse, MD;  Location: [facility];  Service: General;  Laterality: N/A;   RECTOCELE REPAIR     TONSILLECTOMY     VAGINAL HYSTERECTOMY      Current Outpatient Medications  Medication Sig Dispense Refill   acetaminophen (TYLENOL) 500 MG tablet Take 1,000 mg by mouth every 6 (six) hours as needed for moderate pain or headache.     amLODipine (NORVASC) 5 MG tablet Take 1.5 tablets (7.5 mg total) by mouth daily. 135 tablet 1   anastrozole (ARIMIDEX) 1 MG tablet Take 1 tablet by mouth daily 90 tablet 2   apixaban (ELIQUIS) 5 MG TABS tablet Take 1 tablet (5 mg total) by mouth 2 (two) times daily. 180 tablet 2   Cholecalciferol (VITAMIN D3) 5000 units CAPS Take 5,000 Units by mouth daily with supper.     dofetilide (TIKOSYN) 500 MCG capsule TAKE 1 CAPSULE BY MOUTH TWICE A DAY 180 capsule 2   ezetimibe (ZETIA) 10 MG tablet Take 1 tablet (10 mg total) by mouth daily. 90 tablet 1   famotidine (PEPCID) 20 MG tablet Take 1 tablet (20 mg total) by mouth 2 (two) times daily. 180 tablet 1   furosemide (LASIX) 20 MG tablet Take 1 tablet (20 mg total) by mouth daily. 90 tablet 1   LORazepam (ATIVAN) 0.5 MG tablet Take 0.5-1 tablets (0.25-0.5 mg total) by mouth daily as needed for anxiety. 20 tablet 2   olmesartan (BENICAR) 5 MG tablet Take 1 tablet (5 mg total) by mouth daily. 90 tablet 1   potassium chloride SA (KLOR-CON M) 20 MEQ tablet Take 1 tablet (20 mEq total) by mouth 2 (two) times daily. 180 tablet 1   No current facility-administered medications for this visit.    Allergies  Allergen Reactions   Augmentin [Amoxicillin-Pot Clavulanate] Other (See  Comments)    c-diff Has patient had a PCN reaction causing immediate rash, facial/tongue/throat swelling, SOB or lightheadedness with hypotension: No Has patient had a PCN reaction causing severe rash involving mucus membranes or skin necrosis: No Has patient had a PCN reaction that required hospitalization: No Has patient had a PCN reaction occurring within the last 10 years: Yes If all of the above answers are "NO", then may proceed with Cephalosporin use.    Codeine Palpitations and Rash   Prednisone Other (See Comments)    Jittery, red in the face   Celecoxib Other (See Comments)    Social History   Socioeconomic History   Marital status: Widowed    Spouse name: Not on file   Number of children: 2   Years of education: 83   Highest education level: Not on file  Occupational History   Occupation: Retired    Comment: Geophysical data processor  Tobacco Use   Smoking status: Never   Smokeless tobacco: Never   Tobacco comments:    Never smoke 06/30/22  Vaping Use   Vaping Use: Never used  Substance and Sexual Activity   Alcohol use: No    Alcohol/week: 0.0 standard drinks of alcohol   Drug use: No   Sexual activity: Not Currently    Birth control/protection: Post-menopausal  Other Topics Concern   Not on file  Social History Narrative   Lives alone - children in [place] and [place] -    Caffeine use: Soda/tea daily   Good church support group   Social Determinants of Health   Financial Resource Strain: Low Risk  (03/30/2023)   Overall Financial Resource Strain (CARDIA)    Difficulty of Paying Living Expenses: Not hard at all  Food Insecurity: No Food Insecurity (03/30/2023)   Hunger Vital Sign    Worried About Running Out of Food in the Last Year: Never true    Ran Out of Food in the Last Year: Never true  Transportation Needs: No Transportation Needs (03/30/2023)   PRAPARE - Administrator, Civil Service (Medical): No    Lack of Transportation (Non-Medical):  No  Physical Activity: Insufficiently Active (03/30/2023)   Exercise Vital Sign    Days of Exercise per Week: 3 days    Minutes of Exercise per Session: 30 min  Stress: No Stress Concern Present (03/30/2023)   [employer] of Occupational Health - Occupational Stress Questionnaire    Feeling of Stress : Not at all  Social Connections: Moderately Integrated (03/30/2023)   Social Connection and Isolation Panel [NHANES]    Frequency of Communication with Friends and Family: More than three times a week    Frequency of Social Gatherings with Friends and Family: More than three times a week    Attends Religious Services: More than 4 times per year    Active Member of [employer] or Organizations: Yes    Attends Banker Meetings: More than 4 times per year    Marital Status: Widowed  Intimate Partner Violence: Not At Risk (03/30/2023)   Humiliation, Afraid, Rape, and Kick questionnaire    Fear of Current or Ex-Partner: No    Emotionally Abused: No    Physically Abused: No    Sexually Abused: No    Family History  Problem Relation Age of Onset   Hypertension Mother    Hyperlipidemia Mother    Neurologic Disorder Mother 40       GB   Stroke Son    Stroke Maternal Uncle    Stroke Grandchild    Prostate cancer Brother     ROS- All systems are reviewed and negative except as per the HPI above  Physical Exam: There were no vitals filed for this visit.   Wt Readings from Last 3 Encounters:  04/12/23 115.3 kg  03/30/23 119.3 kg  01/22/23 124.3 kg    Labs: Lab Results  Component Value Date   NA 144 01/20/2023   K 4.5 01/20/2023   CL 105 01/20/2023   CO2 24 01/20/2023   GLUCOSE 104 (H) 01/20/2023   BUN 13 01/20/2023   CREATININE 0.80 01/20/2023   CALCIUM 9.6 01/20/2023   PHOS 4.1 06/30/2021   MG 1.9 12/29/2022   Lab Results  Component Value Date   INR 1.7 (H) 06/29/2021   Lab Results  Component Value Date   CHOL  194 01/20/2023   HDL 61 01/20/2023    LDLCALC 111 (H) 01/20/2023   TRIG 125 01/20/2023   GEN- The patient is well appearing, alert and oriented x 3 today.   Head- normocephalic, atraumatic Eyes-  Sclera clear, conjunctiva pink Ears- hearing intact Lungs- Clear to ausculation bilaterally, normal work of breathing Heart- Regular rate and rhythm, no murmurs, rubs or gallops, PMI not laterally displaced Extremities- no clubbing, cyanosis, or edema MS- no significant deformity or atrophy Skin- no rash or lesion Psych- euthymic mood, full affect Neuro- strength and sensation are intact   EKG - Vent. rate 63 BPM PR interval 192 ms QRS duration 72 ms QT/QTcB 428/437 ms P-R-T axes 91 -24 29 Sinus rhythm with frequent Premature ventricular complexes in a pattern of bigeminy Inferior infarct , age undetermined Anterior infarct , age undetermined Abnormal ECG When compared with ECG of 12-Apr-2023 16:07, PREVIOUS ECG IS PRESENT  ECHO 11/17/2017: - Left ventricle: The cavity size was normal. There was mild    concentric hypertrophy. Systolic function was normal. The    estimated ejection fraction was in the range of 60% to 65%. Wall    motion was normal; there were no regional wall motion    abnormalities.  - Aortic valve: Transvalvular velocity was within the normal range.    There was no stenosis. There was no regurgitation.  - Aorta: Ascending aortic diameter: 38 mm (S).  - Ascending aorta: The ascending aorta was mildly dilated.  - Mitral valve: Mildly calcified annulus. Transvalvular velocity    was within the normal range. There was no evidence for stenosis.    There was mild regurgitation.  - Left atrium: The atrium was mildly dilated.  - Right ventricle: The cavity size was normal. Wall thickness was    normal. Systolic function was normal.  - Atrial septum: No defect or patent foramen ovale was identified.  - Tricuspid valve: There was mild-moderate regurgitation.  - Pulmonic valve: There was moderate  regurgitation.  - Pulmonary arteries: Systolic pressure was severely increased. PA    peak pressure: 69 mm Hg (S).   Cardiac monitor 5/6-20/24:  Patch Wear Time:  13 days and 19 hours    Predominant rhythm was sinus rhythm 2 SVT episodes, longest and fastest 1 minute 17 seconds at 130 bpm No atrial fibrillation Less than 1% supraventricular ectopy 8% ventricular ectopy Patient triggered episodes associated with both sinus rhythm and sinus rhythm with PVCs   Assessment and Plan: 1. Persistent afib - on Tikosyn since 10/2018.   She is in SR today. Monitor reassuring with no Afib and 8% ectopy. Could start Toprol 12.5 mg qhs but patient will hold off on this for now with upcoming knee surgery. She will call after surgery if would like to start this.  Qtc stable. Bmet and mag drawn today. Continue Tikosyn 500 mcg BID   Continue eliquis 5 mg bid for CHA2DS2VASc of at least 5   2. HTN Elevated here but stable at home   F/u with afib clinic in 6 months for Tikosyn surveillance.  Lake Bells, PA-C Afib Clinic [facility] [address] [place], [place] [address] [phone]

## 2023-05-13 ENCOUNTER — Ambulatory Visit (HOSPITAL_COMMUNITY): Payer: PPO | Admitting: Internal Medicine

## 2023-05-13 DIAGNOSIS — I4819 Other persistent atrial fibrillation: Secondary | ICD-10-CM | POA: Diagnosis not present

## 2023-05-13 DIAGNOSIS — R54 Age-related physical debility: Secondary | ICD-10-CM

## 2023-05-13 DIAGNOSIS — I071 Rheumatic tricuspid insufficiency: Secondary | ICD-10-CM | POA: Diagnosis not present

## 2023-05-13 DIAGNOSIS — Z8619 Personal history of other infectious and parasitic diseases: Secondary | ICD-10-CM | POA: Insufficient documentation

## 2023-05-13 DIAGNOSIS — Z01818 Encounter for other preprocedural examination: Secondary | ICD-10-CM | POA: Diagnosis not present

## 2023-05-13 DIAGNOSIS — F411 Generalized anxiety disorder: Secondary | ICD-10-CM | POA: Diagnosis not present

## 2023-05-13 DIAGNOSIS — R600 Localized edema: Secondary | ICD-10-CM | POA: Insufficient documentation

## 2023-05-13 DIAGNOSIS — M1711 Unilateral primary osteoarthritis, right knee: Secondary | ICD-10-CM | POA: Diagnosis not present

## 2023-05-13 DIAGNOSIS — F419 Anxiety disorder, unspecified: Secondary | ICD-10-CM | POA: Diagnosis not present

## 2023-05-13 DIAGNOSIS — E785 Hyperlipidemia, unspecified: Secondary | ICD-10-CM | POA: Diagnosis not present

## 2023-05-13 DIAGNOSIS — Z79899 Other long term (current) drug therapy: Secondary | ICD-10-CM | POA: Diagnosis not present

## 2023-05-13 DIAGNOSIS — Z853 Personal history of malignant neoplasm of breast: Secondary | ICD-10-CM | POA: Insufficient documentation

## 2023-05-13 DIAGNOSIS — Z01810 Encounter for preprocedural cardiovascular examination: Secondary | ICD-10-CM | POA: Diagnosis not present

## 2023-05-13 DIAGNOSIS — K219 Gastro-esophageal reflux disease without esophagitis: Secondary | ICD-10-CM | POA: Diagnosis not present

## 2023-05-13 DIAGNOSIS — E66813 Obesity, class 3: Secondary | ICD-10-CM | POA: Insufficient documentation

## 2023-05-13 DIAGNOSIS — I272 Pulmonary hypertension, unspecified: Secondary | ICD-10-CM | POA: Insufficient documentation

## 2023-05-13 DIAGNOSIS — Z01812 Encounter for preprocedural laboratory examination: Secondary | ICD-10-CM | POA: Diagnosis not present

## 2023-05-13 DIAGNOSIS — Z8673 Personal history of transient ischemic attack (TIA), and cerebral infarction without residual deficits: Secondary | ICD-10-CM | POA: Diagnosis not present

## 2023-05-13 DIAGNOSIS — Z7901 Long term (current) use of anticoagulants: Secondary | ICD-10-CM | POA: Diagnosis not present

## 2023-05-13 DIAGNOSIS — Z79811 Long term (current) use of aromatase inhibitors: Secondary | ICD-10-CM | POA: Diagnosis not present

## 2023-05-13 DIAGNOSIS — I10 Essential (primary) hypertension: Secondary | ICD-10-CM | POA: Diagnosis not present

## 2023-05-13 DIAGNOSIS — Z6841 Body Mass Index (BMI) 40.0 and over, adult: Secondary | ICD-10-CM | POA: Diagnosis not present

## 2023-05-13 DIAGNOSIS — C50912 Malignant neoplasm of unspecified site of left female breast: Secondary | ICD-10-CM | POA: Diagnosis not present

## 2023-05-13 HISTORY — DX: Age-related physical debility: R54

## 2023-05-14 ENCOUNTER — Telehealth: Payer: Self-pay

## 2023-05-14 NOTE — Telephone Encounter (Signed)
Pt has been scheduled to see Joni Reining, DNP 6-17/24 @ 1:55 for pre op clearance. I will update all parties involved.

## 2023-05-14 NOTE — Telephone Encounter (Signed)
Patient with diagnosis of afib on Eliquis for anticoagulation.    Procedure: Eras Robotic Right Knee Arthroplasty Total- Stryker   Date of procedure: 05/28/23  CHA2DS2-VASc Score = 6  This indicates a 9.7% annual risk of stroke. The patient's score is based upon: CHF History: 0 HTN History: 1 Diabetes History: 0 Stroke History: 2 Vascular Disease History: 0 Age Score: 2 Gender Score: 1   TIA occurred 02/2016. Previously cleared for 2 day hold for different procedure by Dr Johney Frame. Dr Johney Frame no longer with [facility], pt only following with afib clinic since then.  CrCl 82mL/min using adj body weight Platelet count 249K  Per office protocol, patient can hold Eliquis for 3 days prior to procedure. She should resume as soon as safely possible after given elevated CV risk.   **This guidance is not considered finalized until pre-operative APP has relayed final recommendations.**

## 2023-05-14 NOTE — Telephone Encounter (Signed)
Left message to call back to schedule in office appt with Dr. Antoine Poche or APP.

## 2023-05-14 NOTE — Telephone Encounter (Signed)
   Pre-operative Risk Assessment    Patient Name: [name] Collier  DOB: 1946-05-16 MRN: [id number]      Request for Surgical Clearance    Procedure:   Eras Robotic Right Knee Arthroplasty Total- Stryker  Date of Surgery:  Clearance 05/28/23                                 Surgeon:  Doristine Counter, MD Surgeon's Group or Practice Name:  [facility] Phone number:  [phone] Fax number:  [phone]   Type of Clearance Requested:   - Medical  - Pharmacy:  Hold Apixaban (Eliquis) pt will need instructions on when/if to hold   Type of Anesthesia:   CHOICE   Additional requests/questions:    Wynetta Fines   05/14/2023, 10:26 AM

## 2023-05-14 NOTE — Telephone Encounter (Signed)
   Name: [name] Collier  DOB: 1946-07-13  MRN: [id number]  Primary Cardiologist: [name], MD  Chart reviewed as part of pre-operative protocol coverage. Because of [name] Collier's past medical history and time since last visit, she will require a follow-up in-office visit in order to better assess preoperative cardiovascular risk.  Pre-op covering staff: - Please schedule appointment and call patient to inform them. If patient already had an upcoming appointment within acceptable timeframe, please add "pre-op clearance" to the appointment notes so provider is aware. - Please contact requesting surgeon's office via preferred method (i.e, phone, fax) to inform them of need for appointment prior to surgery.  Per office protocol, patient can hold Eliquis for 3 days prior to procedure. She should resume as soon as safely possible after given elevated CV risk.   Sharlene Dory, PA-C  05/14/2023, 12:38 PM

## 2023-05-21 ENCOUNTER — Encounter: Payer: Self-pay | Admitting: Nurse Practitioner

## 2023-05-21 ENCOUNTER — Ambulatory Visit: Payer: PPO | Attending: Adult Health | Admitting: Nurse Practitioner

## 2023-05-21 VITALS — BP 124/74 | HR 63 | Ht 64.5 in | Wt 249.0 lb

## 2023-05-21 DIAGNOSIS — I480 Paroxysmal atrial fibrillation: Secondary | ICD-10-CM

## 2023-05-21 DIAGNOSIS — Z8673 Personal history of transient ischemic attack (TIA), and cerebral infarction without residual deficits: Secondary | ICD-10-CM | POA: Diagnosis not present

## 2023-05-21 DIAGNOSIS — E782 Mixed hyperlipidemia: Secondary | ICD-10-CM | POA: Diagnosis not present

## 2023-05-21 DIAGNOSIS — I10 Essential (primary) hypertension: Secondary | ICD-10-CM

## 2023-05-21 DIAGNOSIS — Z01810 Encounter for preprocedural cardiovascular examination: Secondary | ICD-10-CM

## 2023-05-21 NOTE — Progress Notes (Signed)
Office Visit    Patient Name: [name] Collier Date of Encounter: 05/21/2023  Primary Care Provider:  Raliegh Ip, DO Primary Cardiologist:  [name], MD  Chief Complaint    77 year old female with a history of paroxysmal atrial fibrillation on Tikosyn, hypertension, hyperlipidemia, TIA, breast cancer, and GERD who presents for follow-up related to atrial fibrillation and for preoperative cardiac evaluation.  Past Medical History    Past Medical History:  Diagnosis Date   Anxiety    Atrial fibrillation (HCC)    Cancer (HCC) 11/2020   left breast IMC   GERD (gastroesophageal reflux disease)    HTN (hypertension)    Hyperlipidemia    Personal history of radiation therapy    TIA (transient ischemic attack)    Past Surgical History:  Procedure Laterality Date   BOTOX INJECTION N/A 12/22/2018   Procedure: BOTOX INJECTION INTO ANAL SPHINCTER;  Surgeon: Andria Meuse, MD;  Location: [facility];  Service: General;  Laterality: N/A;   BREAST BIOPSY Left 11/26/2020   BREAST LUMPECTOMY WITH RADIOACTIVE SEED AND SENTINEL LYMPH NODE BIOPSY Left 01/07/2021   Procedure: LEFT BREAST LUMPECTOMY WITH RADIOACTIVE SEED AND LEFT AXILLARY SENTINEL LYMPH NODE BIOPSY WITH BLUE DYE INJECTION;  Surgeon: Manus Rudd, MD;  Location: [facility];  Service: General;  Laterality: Left;  LMA WITH PECTORAL BLOCK, BLUE DYE INJECTION   CARDIOVERSION N/A 01/04/2018   Procedure: CARDIOVERSION;  Surgeon: Lars Masson, MD;  Location: [facility] ENDOSCOPY;  Service: Cardiovascular;  Laterality: N/A;   CARDIOVERSION N/A 09/20/2018   Procedure: CARDIOVERSION;  Surgeon: Quintella Reichert, MD;  Location: [facility] ENDOSCOPY;  Service: Cardiovascular;  Laterality: N/A;   CARDIOVERSION N/A 10/27/2018   Procedure: CARDIOVERSION;  Surgeon: Jodelle Red, MD;  Location: [facility] ENDOSCOPY;  Service: Cardiovascular;  Laterality: N/A;   KNEE SURGERY Bilateral    Torn miniscus   RECTAL EXAM UNDER  ANESTHESIA N/A 12/22/2018   Procedure: ANORECTAL  EXAM UNDER ANESTHESIA;  Surgeon: Andria Meuse, MD;  Location: [facility];  Service: General;  Laterality: N/A;   RECTOCELE REPAIR     TONSILLECTOMY     VAGINAL HYSTERECTOMY      Allergies  Allergies  Allergen Reactions   Augmentin [Amoxicillin-Pot Clavulanate] Other (See Comments)    c-diff Has patient had a PCN reaction causing immediate rash, facial/tongue/throat swelling, SOB or lightheadedness with hypotension: No Has patient had a PCN reaction causing severe rash involving mucus membranes or skin necrosis: No Has patient had a PCN reaction that required hospitalization: No Has patient had a PCN reaction occurring within the last 10 years: Yes If all of the above answers are "NO", then may proceed with Cephalosporin use.    Codeine Palpitations and Rash   Prednisone Other (See Comments)    Jittery, red in the face   Celecoxib Other (See Comments)     Labs/Other Studies Reviewed    The following studies were reviewed today:  Cardiac Studies & Procedures       ECHOCARDIOGRAM  ECHOCARDIOGRAM COMPLETE 11/17/2017  Narrative *[facility]* [address] [place], [place] [address] [phone]  ------------------------------------------------------------------- Echocardiography  Patient:    [name] Collier MR #:       [id number] Study Date: 11/17/2017 Gender:     F Age:        15 Height:     167.6 cm Weight:     128.8 kg BSA:        2.52 m^2 Pt. Status: Room:  ATTENDING  Armanda Magic, MD ORDERING     [name], MD REFERRING    [name], MD REFERRING    Joette Catching [id number] SONOGRAPHER  Dewitt Hoes, RDCS PERFORMING   [facility], Outpatient  cc:  ------------------------------------------------------------------- LV EF: 60% -   65%  ------------------------------------------------------------------- Indications:      Atrial fibrillation  (I48.1).  ------------------------------------------------------------------- History:   PMH:   Dyspnea.  Atrial fibrillation.  Transient ischemic attack.  Risk factors:  Anemia. Hypertension. Dyslipidemia.  ------------------------------------------------------------------- Study Conclusions  - Left ventricle: The cavity size was normal. There was mild concentric hypertrophy. Systolic function was normal. The estimated ejection fraction was in the range of 60% to 65%. Wall motion was normal; there were no regional wall motion abnormalities. - Aortic valve: Transvalvular velocity was within the normal range. There was no stenosis. There was no regurgitation. - Aorta: Ascending aortic diameter: 38 mm (S). - Ascending aorta: The ascending aorta was mildly dilated. - Mitral valve: Mildly calcified annulus. Transvalvular velocity was within the normal range. There was no evidence for stenosis. There was mild regurgitation. - Left atrium: The atrium was mildly dilated. - Right ventricle: The cavity size was normal. Wall thickness was normal. Systolic function was normal. - Atrial septum: No defect or patent foramen ovale was identified. - Tricuspid valve: There was mild-moderate regurgitation. - Pulmonic valve: There was moderate regurgitation. - Pulmonary arteries: Systolic pressure was severely increased. PA peak pressure: 69 mm Hg (S).  ------------------------------------------------------------------- Labs, prior tests, procedures, and surgery: Transthoracic echocardiography (02/27/2016).     EF was 55% and PA pressure was 31 (systolic).  ------------------------------------------------------------------- Study data:  Comparison was made to the study of 02/27/2016.  Study status:  Routine.  Procedure:  The patient reported no pain pre or post test. Transthoracic echocardiography. Image quality was adequate.  Study completion:  There were no complications. Echocardiography.   M-mode, complete 2D, spectral Doppler, and color Doppler.  Birthdate:  Patient birthdate: [birth year].  Age:  Patient is 77 yr old.  Sex:  Gender: female.    BMI: 45.9 kg/m^2.  Blood pressure:     162/90  Patient status:  Outpatient.  Study date: Study date: 11/17/2017. Study time: 10:28 AM.  Location:  [facility]  -------------------------------------------------------------------  ------------------------------------------------------------------- Left ventricle:  The cavity size was normal. There was mild concentric hypertrophy. Systolic function was normal. The estimated ejection fraction was in the range of 60% to 65%. Wall motion was normal; there were no regional wall motion abnormalities. The study was not technically sufficient to allow evaluation of LV diastolic dysfunction due to atrial fibrillation.  ------------------------------------------------------------------- Aortic valve:   Trileaflet; mildly thickened, mildly calcified leaflets. Mobility was not restricted.  Doppler:  Transvalvular velocity was within the normal range. There was no stenosis. There was no regurgitation.  ------------------------------------------------------------------- Aorta:  Aortic root: The aortic root was normal in size. Ascending aorta: The ascending aorta was mildly dilated.  ------------------------------------------------------------------- Mitral valve:   Mildly calcified annulus. Mobility was not restricted.  Doppler:  Transvalvular velocity was within the normal range. There was no evidence for stenosis. There was mild regurgitation.  ------------------------------------------------------------------- Left atrium:  The atrium was mildly dilated.  ------------------------------------------------------------------- Atrial septum:  No defect or patent foramen ovale was identified.  ------------------------------------------------------------------- Right ventricle:  The  cavity size was normal. Wall thickness was normal. Systolic function was normal.  ------------------------------------------------------------------- Pulmonic valve:    Structurally normal valve.   Cusp separation was normal.  Doppler:  Transvalvular velocity was within the normal range. There  was no evidence for stenosis. There was moderate regurgitation.  ------------------------------------------------------------------- Tricuspid valve:   Structurally normal valve.    Doppler: Transvalvular velocity was within the normal range. There was mild-moderate regurgitation.  ------------------------------------------------------------------- Pulmonary artery:   The main pulmonary artery was normal-sized. Systolic pressure was severely increased.  ------------------------------------------------------------------- Right atrium:  The atrium was normal in size.  ------------------------------------------------------------------- Pericardium:  There was no pericardial effusion.  ------------------------------------------------------------------- Systemic veins: Inferior vena cava: The vessel was dilated. The respirophasic diameter changes were blunted (< 50%), consistent with elevated central venous pressure.  ------------------------------------------------------------------- Measurements  Left ventricle                         Value        Reference LV ID, ED, PLAX chordal                46    mm     43 - 52 LV ID, ES, PLAX chordal                31.4  mm     23 - 38 LV fx shortening, PLAX chordal         32    %      >=29 LV PW thickness, ED                    12.4  mm     --------- IVS/LV PW ratio, ED                    0.9          <=1.3 Stroke volume, 2D                      88    ml     --------- Stroke volume/bsa, 2D                  35    ml/m^2 ---------  Ventricular septum                     Value        Reference IVS thickness, ED                      11.2  mm      ---------  LVOT                                   Value        Reference LVOT ID, S                             20    mm     --------- LVOT area                              3.14  cm^2   --------- LVOT peak velocity, S                  112   cm/s   --------- LVOT mean velocity, S                  77.6  cm/s   --------- LVOT VTI, S  27.9  cm     --------- LVOT peak gradient, S                  5     mm Hg  ---------  Aorta                                  Value        Reference Aortic root ID, ED                     33    mm     --------- Ascending aorta ID, A-P, S             38    mm     ---------  Left atrium                            Value        Reference LA ID, A-P, ES                         57    mm     --------- LA ID/bsa, A-P                 (H)     2.26  cm/m^2 <=2.2 LA volume, S                           78    ml     --------- LA volume/bsa, S                       30.9  ml/m^2 --------- LA volume, ES, 1-p A4C                 83.5  ml     --------- LA volume/bsa, ES, 1-p A4C             33.1  ml/m^2 --------- LA volume, ES, 1-p A2C                 65.2  ml     --------- LA volume/bsa, ES, 1-p A2C             25.9  ml/m^2 ---------  Pulmonary arteries                     Value        Reference PA pressure, S, DP             (H)     69    mm Hg  <=30  Tricuspid valve                        Value        Reference Tricuspid regurg peak velocity         366   cm/s   --------- Tricuspid peak RV-RA gradient          54    mm Hg  ---------  Systemic veins                         Value        Reference Estimated CVP  15    mm Hg  ---------  Right ventricle                        Value        Reference RV pressure, S, DP             (H)     69    mm Hg  <=30  Legend: (L)  and  (H)  mark values outside specified reference range.  ------------------------------------------------------------------- Prepared and Electronically  Authenticated by  Chilton Si, MD 2018-12-12T11:59:11    MONITORS  LONG TERM MONITOR (3-14 DAYS) 04/29/2023  Narrative Patch Wear Time:  13 days and 19 hours  Predominant rhythm was sinus rhythm 2 SVT episodes, longest and fastest 1 minute 17 seconds at 130 bpm No atrial fibrillation Less than 1% supraventricular ectopy 8% ventricular ectopy Patient triggered episodes associated with both sinus rhythm and sinus rhythm with PVCs  Will Camnitz, MD          Recent Labs: 01/20/2023: ALT 16; Hemoglobin 13.5; Platelets 249; TSH 3.980 05/12/2023: BUN 18; Creatinine, Ser 0.91; Magnesium 2.0; Potassium 4.3; Sodium 139  Recent Lipid Panel    Component Value Date/Time   CHOL 194 01/20/2023 0856   TRIG 125 01/20/2023 0856   HDL 61 01/20/2023 0856   CHOLHDL 3.2 01/20/2023 0856   CHOLHDL 4.6 02/27/2016 0519   VLDL 19 02/27/2016 0519   LDLCALC 111 (H) 01/20/2023 0856    History of Present Illness    77 year old female with the above past medical history including paroxysmal atrial fibrillation on Tikosyn, hypertension, hyperlipidemia, TIA, breast cancer, and GERD.  She has a history of atrial fibrillation on Tikosyn and Eliquis.  She has followed primarily in the A-fib clinic. Most recent echocardiogram in 2018 showed EF 60 to 65%, severely increased PASP.  Recent cardiac monitor in 04/2023 revealed predominantly sinus rhythm, 2 episodes of fast, no atrial fibrillation, 80% PVC burden.  She was last seen in the A-fib clinic on 05/12/2023 and was stable from a cardiac standpoint.  She last saw general cardiology in 2019.  She presents today for follow-up and for preoperative cardiac evaluation for upcoming robotic right knee total arthroplasty with Dr. Doristine Counter of [facility] with request to hold Eliquis prior to surgery.  She presents today for follow-up. Since her last visit she has done well from a cardiac standpoint.  She states that she was seen by anesthesia at  [facility] for preop evaluation and it was noted that her echo in 2018 showed severely elevated PASP.  She is pending repeat echo to reassess this per [facility].  Her activity is somewhat limited in the setting of right knee pain.  She denies any chest pain, dyspnea, palpitations, dizziness, edema, PND, orthopnea, weight gain.  Overall, she reports feeling well.   Home Medications    Current Outpatient Medications  Medication Sig Dispense Refill   acetaminophen (TYLENOL) 500 MG tablet Take 1,000 mg by mouth every 6 (six) hours as needed for moderate pain or headache.     amLODipine (NORVASC) 5 MG tablet Take 1.5 tablets (7.5 mg total) by mouth daily. 135 tablet 1   anastrozole (ARIMIDEX) 1 MG tablet Take 1 tablet by mouth daily 90 tablet 2   apixaban (ELIQUIS) 5 MG TABS tablet Take 1 tablet (5 mg total) by mouth 2 (two) times daily. 180 tablet 2   Cholecalciferol (VITAMIN D3) 5000 units CAPS Take 5,000 Units by mouth  daily with supper.     dofetilide (TIKOSYN) 500 MCG capsule TAKE 1 CAPSULE BY MOUTH TWICE A DAY 180 capsule 2   ezetimibe (ZETIA) 10 MG tablet Take 1 tablet (10 mg total) by mouth daily. 90 tablet 1   famotidine (PEPCID) 20 MG tablet Take 1 tablet (20 mg total) by mouth 2 (two) times daily. 180 tablet 1   furosemide (LASIX) 20 MG tablet Take 1 tablet (20 mg total) by mouth daily. 90 tablet 1   LORazepam (ATIVAN) 0.5 MG tablet Take 0.5-1 tablets (0.25-0.5 mg total) by mouth daily as needed for anxiety. 20 tablet 2   olmesartan (BENICAR) 5 MG tablet Take 1 tablet (5 mg total) by mouth daily. 90 tablet 1   potassium chloride SA (KLOR-CON M) 20 MEQ tablet Take 1 tablet (20 mEq total) by mouth 2 (two) times daily. 180 tablet 1   No current facility-administered medications for this visit.     Review of Systems    She denies chest pain, palpitations, dyspnea, pnd, orthopnea, n, v, dizziness, syncope, edema, weight gain, or early satiety. All other systems reviewed and are otherwise negative  except as noted above.   Physical Exam    VS:  BP 124/74 (BP Location: Right Arm, Patient Position: Sitting, Cuff Size: Large)   Pulse 63   Ht 5' 4.5" (1.638 m)   Wt 249 lb (112.9 kg)   BMI 42.08 kg/m  GEN: Well nourished, well developed, in no acute distress. HEENT: normal. Neck: Supple, no JVD, carotid bruits, or masses. Cardiac: RRR, no murmurs, rubs, or gallops. No clubbing, cyanosis, edema.  Radials/DP/PT 2+ and equal bilaterally.  Respiratory:  Respirations regular and unlabored, clear to auscultation bilaterally. GI: Soft, nontender, nondistended, BS + x 4. MS: no deformity or atrophy. Skin: warm and dry, no rash. Neuro:  Strength and sensation are intact. Psych: Normal affect.  Accessory Clinical Findings    ECG personally reviewed by me today -sinus rhythm, 63 bpm, first-degree AV block- no acute changes.   Lab Results  Component Value Date   WBC 4.9 01/20/2023   HGB 13.5 01/20/2023   HCT 41.4 01/20/2023   MCV 90 01/20/2023   PLT 249 01/20/2023   Lab Results  Component Value Date   CREATININE 0.91 05/12/2023   BUN 18 05/12/2023   NA 139 05/12/2023   K 4.3 05/12/2023   CL 107 05/12/2023   CO2 24 05/12/2023   Lab Results  Component Value Date   ALT 16 01/20/2023   AST 18 01/20/2023   ALKPHOS 101 01/20/2023   BILITOT 1.1 01/20/2023   Lab Results  Component Value Date   CHOL 194 01/20/2023   HDL 61 01/20/2023   LDLCALC 111 (H) 01/20/2023   TRIG 125 01/20/2023   CHOLHDL 3.2 01/20/2023    Lab Results  Component Value Date   HGBA1C 5.6 04/24/2022    Assessment & Plan   1. Paroxysmal atrial fibrillation: Maintaining sinus rhythm on Tikosyn.  Recent BMET, magnesium were within normal limits.  Denies any palpitations.  Continue Tikosyn, Eliquis.  2. Hypertension: BP well controlled. Continue current antihypertensive regimen.   3. Pulmonary hypertension: Echocardiogram in 2018 showed significantly increased PASP.  She has not had a repeat echo since.   She is asymptomatic.  This was likely in the setting of uncontrolled atrial fibrillation.  She is pending repeat echocardiogram per [facility].  Stable overall.  4. Hyperlipidemia: LDL was 111 in 01/2023.  Monitored and managed per PCP.  Continue Zetia.  5. History of TIA: No recurrence.  Continue Eliquis, Zetia.  6. Preoperative cardiac exam: According to the Revised Cardiac Risk Index (RCRI), her Perioperative Risk of Major Cardiac Event is (%): 0.9. Her Functional Capacity in METs is: 4.4 according to the [employer] Activity Status Index (DASI).Therefore, based on ACC/AHA guidelines, patient would be at acceptable risk for the planned procedure without further cardiovascular testing.  She is pending repeat echo per [facility] per anesthesia's request.  However, from a cardiac standpoint, this should not preclude surgery. Per office protocol, patient can hold Eliquis for 3 days prior to procedure. She should resume as soon as safely possible after given elevated CV risk.  I will route this recommendation to the requesting party via Epic fax function.  7. Disposition: Follow-up as needed with Dr. Antoine Poche, follow-up as scheduled with A-fib clinic in 11/2023.      Joylene Grapes, NP 05/21/2023, 8:56 AM

## 2023-05-21 NOTE — Patient Instructions (Signed)
Medication Instructions:  No changes  *If you need a refill on your cardiac medications before your next appointment, please call your pharmacy*   Lab Work: Not needed If you have labs (blood work) drawn today and your tests are completely normal, you will receive your results only by: MyChart Message (if you have MyChart) OR A paper copy in the mail If you have any lab test that is abnormal or we need to change your treatment, we will call you to review the results.   Testing/Procedures:  Not needed  Follow-Up: At [facility], you and your health needs are our priority.  As part of our continuing mission to provide you with exceptional heart care, we have created designated Provider Care Teams.  These Care Teams include your primary Cardiologist (physician) and Advanced Practice Providers (APPs -  Physician Assistants and Nurse Practitioners) who all work together to provide you with the care you need, when you need it.     Your next appointment:     as needed  The format for your next appointment:   In Person  Provider:   [name], MD    Other Instructions   Keep appointments with  Afib Clinic   You  have  cardiac clearance  for upcoming  procedures [name] Collier

## 2023-05-24 ENCOUNTER — Ambulatory Visit (INDEPENDENT_AMBULATORY_CARE_PROVIDER_SITE_OTHER): Payer: PPO

## 2023-05-24 ENCOUNTER — Ambulatory Visit (INDEPENDENT_AMBULATORY_CARE_PROVIDER_SITE_OTHER): Payer: PPO | Admitting: Family Medicine

## 2023-05-24 ENCOUNTER — Ambulatory Visit: Payer: PPO | Admitting: Adult Health

## 2023-05-24 ENCOUNTER — Encounter: Payer: Self-pay | Admitting: Family Medicine

## 2023-05-24 VITALS — BP 135/70 | HR 61 | Temp 98.6°F | Ht 64.0 in | Wt 250.0 lb

## 2023-05-24 DIAGNOSIS — R739 Hyperglycemia, unspecified: Secondary | ICD-10-CM

## 2023-05-24 DIAGNOSIS — I272 Pulmonary hypertension, unspecified: Secondary | ICD-10-CM | POA: Diagnosis not present

## 2023-05-24 DIAGNOSIS — F411 Generalized anxiety disorder: Secondary | ICD-10-CM | POA: Diagnosis not present

## 2023-05-24 DIAGNOSIS — I10 Essential (primary) hypertension: Secondary | ICD-10-CM | POA: Diagnosis not present

## 2023-05-24 DIAGNOSIS — F410 Panic disorder [episodic paroxysmal anxiety] without agoraphobia: Secondary | ICD-10-CM

## 2023-05-24 DIAGNOSIS — E7800 Pure hypercholesterolemia, unspecified: Secondary | ICD-10-CM | POA: Diagnosis not present

## 2023-05-24 DIAGNOSIS — Z780 Asymptomatic menopausal state: Secondary | ICD-10-CM | POA: Diagnosis not present

## 2023-05-24 DIAGNOSIS — M85852 Other specified disorders of bone density and structure, left thigh: Secondary | ICD-10-CM | POA: Diagnosis not present

## 2023-05-24 LAB — BAYER DCA HB A1C WAIVED: HB A1C (BAYER DCA - WAIVED): 5.6 % (ref 4.8–5.6)

## 2023-05-24 LAB — LIPID PANEL
Chol/HDL Ratio: 2.9 ratio (ref 0.0–4.4)
Cholesterol, Total: 164 mg/dL (ref 100–199)
HDL: 56 mg/dL (ref >39)
LDL Chol Calc (NIH): 87 mg/dL (ref 0–99)
Triglycerides: 118 mg/dL (ref 0–149)
VLDL Cholesterol Cal: 21 mg/dL (ref 5–40)

## 2023-05-24 MED ORDER — AMLODIPINE BESYLATE 5 MG PO TABS: 5.0000 mg | ORAL_TABLET | Freq: Every day | ORAL | 3 refills | Status: DC

## 2023-05-24 MED ORDER — LORAZEPAM 0.5 MG PO TABS
0.2500 mg | ORAL_TABLET | Freq: Every day | ORAL | 2 refills | Status: DC | PRN
Start: 2023-05-24 — End: 2023-09-22

## 2023-05-24 NOTE — Progress Notes (Signed)
Subjective: CC: Hypertension associate with hyperlipidemia PCP: Raliegh Ip, DO [name] Collier is a 77 y.o. female presenting to clinic today for:  1.  Hypertension associate with hyperlipidemia/ anxiety Patient is compliant with amlodipine.  She notes that she only takes one 5 mg tablet per day rather than the 7.5 mg that is on her bottle.  At some point in time someone instructed her to do this.  Her blood pressures relatively well controlled but notes that sometimes it will go up when she is anxious.  She has an upcoming right sided knee surgery later this week and at her preop, they had to wait for her blood pressure to go down.  She was advised to take her Ativan as it seems that the blood pressure reading was anxiety driven.  She was told that there was some abnormalities in her pulmonary artery pressures back in 2018 and they wanted to follow-up on that with echocardiogram prior to surgery.  She has that scheduled for today.  Her children will help her postop   ROS: Per HPI  Allergies  Allergen Reactions   Augmentin [Amoxicillin-Pot Clavulanate] Other (See Comments)    c-diff Has patient had a PCN reaction causing immediate rash, facial/tongue/throat swelling, SOB or lightheadedness with hypotension: No Has patient had a PCN reaction causing severe rash involving mucus membranes or skin necrosis: No Has patient had a PCN reaction that required hospitalization: No Has patient had a PCN reaction occurring within the last 10 years: Yes If all of the above answers are "NO", then may proceed with Cephalosporin use.    Codeine Palpitations and Rash   Prednisone Other (See Comments)    Jittery, red in the face   Celecoxib Other (See Comments)   Past Medical History:  Diagnosis Date   Anxiety    Atrial fibrillation (HCC)    Cancer (HCC) 11/2020   left breast IMC   GERD (gastroesophageal reflux disease)    HTN (hypertension)    Hyperlipidemia    Personal history of  radiation therapy    TIA (transient ischemic attack)     Current Outpatient Medications:    acetaminophen (TYLENOL) 500 MG tablet, Take 1,000 mg by mouth every 6 (six) hours as needed for moderate pain or headache., Disp: , Rfl:    amLODipine (NORVASC) 5 MG tablet, Take 1.5 tablets (7.5 mg total) by mouth daily., Disp: 135 tablet, Rfl: 1   anastrozole (ARIMIDEX) 1 MG tablet, Take 1 tablet by mouth daily, Disp: 90 tablet, Rfl: 2   apixaban (ELIQUIS) 5 MG TABS tablet, Take 1 tablet (5 mg total) by mouth 2 (two) times daily., Disp: 180 tablet, Rfl: 2   Cholecalciferol (VITAMIN D3) 5000 units CAPS, Take 5,000 Units by mouth daily with supper., Disp: , Rfl:    dofetilide (TIKOSYN) 500 MCG capsule, TAKE 1 CAPSULE BY MOUTH TWICE A DAY, Disp: 180 capsule, Rfl: 2   ezetimibe (ZETIA) 10 MG tablet, Take 1 tablet (10 mg total) by mouth daily., Disp: 90 tablet, Rfl: 1   famotidine (PEPCID) 20 MG tablet, Take 1 tablet (20 mg total) by mouth 2 (two) times daily., Disp: 180 tablet, Rfl: 1   furosemide (LASIX) 20 MG tablet, Take 1 tablet (20 mg total) by mouth daily., Disp: 90 tablet, Rfl: 1   LORazepam (ATIVAN) 0.5 MG tablet, Take 0.5-1 tablets (0.25-0.5 mg total) by mouth daily as needed for anxiety., Disp: 20 tablet, Rfl: 2   olmesartan (BENICAR) 5 MG tablet, Take 1 tablet (5  mg total) by mouth daily., Disp: 90 tablet, Rfl: 1   potassium chloride SA (KLOR-CON M) 20 MEQ tablet, Take 1 tablet (20 mEq total) by mouth 2 (two) times daily., Disp: 180 tablet, Rfl: 1 Social History   Socioeconomic History   Marital status: Widowed    Spouse name: Not on file   Number of children: 2   Years of education: 92   Highest education level: 12th grade  Occupational History   Occupation: Retired    Comment: Geophysical data processor  Tobacco Use   Smoking status: Never   Smokeless tobacco: Never   Tobacco comments:    Never smoke 06/30/22  Vaping Use   Vaping Use: Never used  Substance and Sexual Activity   Alcohol use:  No    Alcohol/week: 0.0 standard drinks of alcohol   Drug use: No   Sexual activity: Not Currently    Birth control/protection: Post-menopausal  Other Topics Concern   Not on file  Social History Narrative   Lives alone - children in [place] and [place] -    Caffeine use: Soda/tea daily   Good church support group   Social Determinants of Health   Financial Resource Strain: Low Risk  (05/22/2023)   Overall Financial Resource Strain (CARDIA)    Difficulty of Paying Living Expenses: Not hard at all  Food Insecurity: No Food Insecurity (05/22/2023)   Hunger Vital Sign    Worried About Running Out of Food in the Last Year: Never true    Ran Out of Food in the Last Year: Never true  Transportation Needs: No Transportation Needs (05/22/2023)   PRAPARE - Administrator, Civil Service (Medical): No    Lack of Transportation (Non-Medical): No  Physical Activity: Inactive (05/22/2023)   Exercise Vital Sign    Days of Exercise per Week: 0 days    Minutes of Exercise per Session: 30 min  Stress: Stress Concern Present (05/22/2023)   [employer] of Occupational Health - Occupational Stress Questionnaire    Feeling of Stress : To some extent  Social Connections: Moderately Integrated (05/22/2023)   Social Connection and Isolation Panel [NHANES]    Frequency of Communication with Friends and Family: More than three times a week    Frequency of Social Gatherings with Friends and Family: Once a week    Attends Religious Services: More than 4 times per year    Active Member of [employer] or Organizations: No    Attends Engineer, structural: More than 4 times per year    Marital Status: Widowed  Intimate Partner Violence: Not At Risk (03/30/2023)   Humiliation, Afraid, Rape, and Kick questionnaire    Fear of Current or Ex-Partner: No    Emotionally Abused: No    Physically Abused: No    Sexually Abused: No   Family History  Problem Relation Age of Onset   Hypertension  Mother    Hyperlipidemia Mother    Neurologic Disorder Mother 69       GB   Stroke Son    Stroke Maternal Uncle    Stroke Grandchild    Prostate cancer Brother     Objective: Office vital signs reviewed. BP 135/70   Pulse 61   Temp 98.6 F (37 C)   Ht 5\' 4"  (1.626 m)   Wt 250 lb (113.4 kg)   SpO2 95%   BMI 42.91 kg/m   Physical Examination:  General: Awake, alert, well nourished, No acute distress HEENT: sclera white, MMM Cardio:  regular rate and rhythm, S1S2 heard, no murmurs appreciated Pulm: clear to auscultation bilaterally, no wheezes, rhonchi or rales; normal work of breathing on room air MSK: Ambulating independently with minimally antalgic gait. Psych: Appears somewhat anxious when talking about her surgery and echocardiogram  Assessment/ Plan: 77 y.o. female   Essential hypertension - Plan: amLODipine (NORVASC) 5 MG tablet  Generalized anxiety disorder with panic attacks - Plan: LORazepam (ATIVAN) 0.5 MG tablet  Pure hypercholesterolemia - Plan: Lipid Panel  Elevated serum glucose - Plan: Bayer DCA Hb A1c Waived  Blood pressure is well-controlled.  I have changed her amlodipine to reflect current usage  Anxiety is slightly exacerbated and this is due to upcoming imaging study of her heart today as well as surgery on her right knee later this week.  Up-to-date on UDS and CSC.  National narcotic database reviewed and there were no red flags.  Has not filled since 2023  Check fasting lipid, follow-up on mild elevation in serum glucose.  DEXA scan following appointment  No orders of the defined types were placed in this encounter.  No orders of the defined types were placed in this encounter.    Raliegh Ip, DO [facility] [phone]

## 2023-05-28 ENCOUNTER — Ambulatory Visit: Payer: PPO | Admitting: Adult Health

## 2023-05-28 DIAGNOSIS — Z888 Allergy status to other drugs, medicaments and biological substances status: Secondary | ICD-10-CM | POA: Diagnosis not present

## 2023-05-28 DIAGNOSIS — Z7901 Long term (current) use of anticoagulants: Secondary | ICD-10-CM | POA: Diagnosis not present

## 2023-05-28 DIAGNOSIS — I071 Rheumatic tricuspid insufficiency: Secondary | ICD-10-CM | POA: Diagnosis not present

## 2023-05-28 DIAGNOSIS — I10 Essential (primary) hypertension: Secondary | ICD-10-CM | POA: Diagnosis not present

## 2023-05-28 DIAGNOSIS — E785 Hyperlipidemia, unspecified: Secondary | ICD-10-CM | POA: Diagnosis not present

## 2023-05-28 DIAGNOSIS — R785 Finding of other psychotropic drug in blood: Secondary | ICD-10-CM | POA: Diagnosis not present

## 2023-05-28 DIAGNOSIS — Z79899 Other long term (current) drug therapy: Secondary | ICD-10-CM | POA: Diagnosis not present

## 2023-05-28 DIAGNOSIS — Z79811 Long term (current) use of aromatase inhibitors: Secondary | ICD-10-CM | POA: Diagnosis not present

## 2023-05-28 DIAGNOSIS — Z6841 Body Mass Index (BMI) 40.0 and over, adult: Secondary | ICD-10-CM | POA: Diagnosis not present

## 2023-05-28 DIAGNOSIS — G8918 Other acute postprocedural pain: Secondary | ICD-10-CM | POA: Diagnosis not present

## 2023-05-28 DIAGNOSIS — Z96651 Presence of right artificial knee joint: Secondary | ICD-10-CM | POA: Diagnosis not present

## 2023-05-28 DIAGNOSIS — F329 Major depressive disorder, single episode, unspecified: Secondary | ICD-10-CM | POA: Diagnosis not present

## 2023-05-28 DIAGNOSIS — M1711 Unilateral primary osteoarthritis, right knee: Secondary | ICD-10-CM | POA: Diagnosis not present

## 2023-05-28 DIAGNOSIS — C50912 Malignant neoplasm of unspecified site of left female breast: Secondary | ICD-10-CM | POA: Diagnosis not present

## 2023-05-28 DIAGNOSIS — I700 Atherosclerosis of aorta: Secondary | ICD-10-CM | POA: Diagnosis not present

## 2023-05-28 DIAGNOSIS — Z885 Allergy status to narcotic agent status: Secondary | ICD-10-CM | POA: Diagnosis not present

## 2023-05-28 DIAGNOSIS — Z880 Allergy status to penicillin: Secondary | ICD-10-CM | POA: Diagnosis not present

## 2023-05-28 DIAGNOSIS — I340 Nonrheumatic mitral (valve) insufficiency: Secondary | ICD-10-CM | POA: Diagnosis not present

## 2023-05-28 DIAGNOSIS — F419 Anxiety disorder, unspecified: Secondary | ICD-10-CM | POA: Diagnosis not present

## 2023-05-28 DIAGNOSIS — I272 Pulmonary hypertension, unspecified: Secondary | ICD-10-CM | POA: Diagnosis not present

## 2023-05-28 DIAGNOSIS — Z471 Aftercare following joint replacement surgery: Secondary | ICD-10-CM | POA: Diagnosis not present

## 2023-05-28 DIAGNOSIS — K219 Gastro-esophageal reflux disease without esophagitis: Secondary | ICD-10-CM | POA: Diagnosis not present

## 2023-05-28 DIAGNOSIS — I4819 Other persistent atrial fibrillation: Secondary | ICD-10-CM | POA: Diagnosis not present

## 2023-05-28 DIAGNOSIS — Z8673 Personal history of transient ischemic attack (TIA), and cerebral infarction without residual deficits: Secondary | ICD-10-CM | POA: Diagnosis not present

## 2023-05-28 HISTORY — PX: TOTAL KNEE ARTHROPLASTY: SHX125

## 2023-06-01 DIAGNOSIS — Z6841 Body Mass Index (BMI) 40.0 and over, adult: Secondary | ICD-10-CM | POA: Diagnosis not present

## 2023-06-01 DIAGNOSIS — Z8673 Personal history of transient ischemic attack (TIA), and cerebral infarction without residual deficits: Secondary | ICD-10-CM | POA: Diagnosis not present

## 2023-06-01 DIAGNOSIS — E785 Hyperlipidemia, unspecified: Secondary | ICD-10-CM | POA: Diagnosis not present

## 2023-06-01 DIAGNOSIS — Z96651 Presence of right artificial knee joint: Secondary | ICD-10-CM | POA: Diagnosis not present

## 2023-06-01 DIAGNOSIS — Z471 Aftercare following joint replacement surgery: Secondary | ICD-10-CM | POA: Diagnosis not present

## 2023-06-01 DIAGNOSIS — Z853 Personal history of malignant neoplasm of breast: Secondary | ICD-10-CM | POA: Diagnosis not present

## 2023-06-01 DIAGNOSIS — F329 Major depressive disorder, single episode, unspecified: Secondary | ICD-10-CM | POA: Diagnosis not present

## 2023-06-01 DIAGNOSIS — I4819 Other persistent atrial fibrillation: Secondary | ICD-10-CM | POA: Diagnosis not present

## 2023-06-01 DIAGNOSIS — F419 Anxiety disorder, unspecified: Secondary | ICD-10-CM | POA: Diagnosis not present

## 2023-06-01 DIAGNOSIS — Z7901 Long term (current) use of anticoagulants: Secondary | ICD-10-CM | POA: Diagnosis not present

## 2023-06-01 DIAGNOSIS — I272 Pulmonary hypertension, unspecified: Secondary | ICD-10-CM | POA: Diagnosis not present

## 2023-06-01 DIAGNOSIS — Z79811 Long term (current) use of aromatase inhibitors: Secondary | ICD-10-CM | POA: Diagnosis not present

## 2023-06-01 DIAGNOSIS — I071 Rheumatic tricuspid insufficiency: Secondary | ICD-10-CM | POA: Diagnosis not present

## 2023-06-01 DIAGNOSIS — I10 Essential (primary) hypertension: Secondary | ICD-10-CM | POA: Diagnosis not present

## 2023-06-01 DIAGNOSIS — K219 Gastro-esophageal reflux disease without esophagitis: Secondary | ICD-10-CM | POA: Diagnosis not present

## 2023-06-01 DIAGNOSIS — Z86010 Personal history of colonic polyps: Secondary | ICD-10-CM | POA: Diagnosis not present

## 2023-06-01 DIAGNOSIS — Z9181 History of falling: Secondary | ICD-10-CM | POA: Diagnosis not present

## 2023-06-14 ENCOUNTER — Encounter: Payer: Self-pay | Admitting: Physical Therapy

## 2023-06-14 ENCOUNTER — Telehealth: Payer: Self-pay | Admitting: Hematology and Oncology

## 2023-06-14 ENCOUNTER — Other Ambulatory Visit: Payer: Self-pay

## 2023-06-14 ENCOUNTER — Ambulatory Visit: Payer: PPO | Attending: Orthopedic Surgery | Admitting: Physical Therapy

## 2023-06-14 DIAGNOSIS — R600 Localized edema: Secondary | ICD-10-CM | POA: Insufficient documentation

## 2023-06-14 DIAGNOSIS — M25561 Pain in right knee: Secondary | ICD-10-CM | POA: Diagnosis not present

## 2023-06-14 DIAGNOSIS — M25661 Stiffness of right knee, not elsewhere classified: Secondary | ICD-10-CM | POA: Diagnosis not present

## 2023-06-14 DIAGNOSIS — R262 Difficulty in walking, not elsewhere classified: Secondary | ICD-10-CM | POA: Diagnosis not present

## 2023-06-14 NOTE — Therapy (Signed)
OUTPATIENT PHYSICAL THERAPY LOWER EXTREMITY EVALUATION   Patient Name: [name] Collier MRN: [id number] DOB:[birth year], 77 y.o., female Today's Date: 06/14/2023  END OF SESSION:  PT End of Session - 06/14/23 1108     Visit Number 1    Number of Visits 16    Date for PT Re-Evaluation 07/26/23    Authorization Type HTA MCR    Authorization Time Period 06/14/23 to 07/26/23    Progress Note Due on Visit 10    PT Start Time 1017    PT Stop Time 1057    PT Time Calculation (min) 40 min    Activity Tolerance Patient tolerated treatment well    Behavior During Therapy [facility] for tasks assessed/performed             Past Medical History:  Diagnosis Date   Anxiety    Atrial fibrillation (HCC)    Cancer (HCC) 11/2020   left breast IMC   GERD (gastroesophageal reflux disease)    HTN (hypertension)    Hyperlipidemia    Personal history of radiation therapy    TIA (transient ischemic attack)    Past Surgical History:  Procedure Laterality Date   BOTOX INJECTION N/A 12/22/2018   Procedure: BOTOX INJECTION INTO ANAL SPHINCTER;  Surgeon: Andria Meuse, MD;  Location: [facility];  Service: General;  Laterality: N/A;   BREAST BIOPSY Left 11/26/2020   BREAST LUMPECTOMY WITH RADIOACTIVE SEED AND SENTINEL LYMPH NODE BIOPSY Left 01/07/2021   Procedure: LEFT BREAST LUMPECTOMY WITH RADIOACTIVE SEED AND LEFT AXILLARY SENTINEL LYMPH NODE BIOPSY WITH BLUE DYE INJECTION;  Surgeon: Manus Rudd, MD;  Location: [facility];  Service: General;  Laterality: Left;  LMA WITH PECTORAL BLOCK, BLUE DYE INJECTION   CARDIOVERSION N/A 01/04/2018   Procedure: CARDIOVERSION;  Surgeon: Lars Masson, MD;  Location: [facility];  Service: Cardiovascular;  Laterality: N/A;   CARDIOVERSION N/A 09/20/2018   Procedure: CARDIOVERSION;  Surgeon: Quintella Reichert, MD;  Location: [facility] ENDOSCOPY;  Service: Cardiovascular;  Laterality: N/A;   CARDIOVERSION N/A 10/27/2018   Procedure: CARDIOVERSION;  Surgeon:  Jodelle Red, MD;  Location: [facility] ENDOSCOPY;  Service: Cardiovascular;  Laterality: N/A;   KNEE SURGERY Bilateral    Torn miniscus   RECTAL EXAM UNDER ANESTHESIA N/A 12/22/2018   Procedure: ANORECTAL  EXAM UNDER ANESTHESIA;  Surgeon: Andria Meuse, MD;  Location: [facility];  Service: General;  Laterality: N/A;   RECTOCELE REPAIR     TONSILLECTOMY     VAGINAL HYSTERECTOMY     Patient Active Problem List   Diagnosis Date Noted   Hypercoagulable state due to persistent atrial fibrillation (HCC) 04/13/2023   Adenomatous polyp of colon 04/30/2022   Esophageal reflux 04/30/2022   Family history of malignant neoplasm of digestive organs 04/30/2022   History of cellulitis 04/28/2022   Encounter for monitoring dofetilide therapy 07/04/2021   Spleen injury 06/29/2021   Carcinoma of upper-inner quadrant of left breast in female, estrogen receptor positive (HCC) 12/20/2020   Trigger thumb of left hand 06/28/2019   Depression, major, single episode, mild (HCC) 03/14/2019   Generalized anxiety disorder 03/14/2019   PAF (paroxysmal atrial fibrillation) (HCC)    Osteoarthritis of knee 04/20/2018   Post-menopausal 02/22/2018   Snoring 01/12/2018   Hypokalemia 01/12/2018   Encounter for cardioversion procedure    Persistent atrial fibrillation 11/11/2017   SOB (shortness of breath) 11/11/2017   Other fatigue 11/11/2017   Hyperlipemia 04/16/2017   Weakness of left hand 05/11/2016   Anxiety related tremor  05/11/2016   Essential hypertension    Normochromic normocytic anemia    TIA (transient ischemic attack) 02/26/2016    PCP: Delynn Flavin DO   REFERRING PROVIDER: Doristine Counter, MD  REFERRING DIAG: M17.11 (ICD-10-CM) - Unilateral primary osteoarthritis, right knee  THERAPY DIAG:  Acute pain of right knee  Stiffness of right knee, not elsewhere classified  Difficulty in walking, not elsewhere classified  Localized edema  Rationale for Evaluation and Treatment:  Rehabilitation  ONSET DATE: June 21st 2024  SUBJECTIVE:   SUBJECTIVE STATEMENT:  Discharged from HHPT on Friday, knee is doing OK. Its been awhile since I've been able to walk normally. I've been surprised by the lack of pain, I've had very little pain and what I have had has been in the back of my leg.   PERTINENT HISTORY: Anxiety, Afib, CA, HTN, HLD, TIA, breast lumpectomy with radioactive seed and sentinel lymph node biopsy, knee surgery, cardioversion PAIN:  Are you having pain? Yes: NPRS scale: 1-2/10 Pain location: back of R LE  Pain description: discomfort Aggravating factors: pressure on the area Relieving factors: standing up  PRECAUTIONS: None  WEIGHT BEARING RESTRICTIONS: No  FALLS:  Has patient fallen in last 6 months? No  LIVING ENVIRONMENT: Lives with: lives alone Lives in: House/apartment Stairs: 2 STE to get to porch, then one more step to enter  Has following equipment at home: Single point cane and Environmental consultant - 2 wheeled  OCCUPATION: retired   PLOF: Independent, Independent with basic ADLs, Independent with gait, and Independent with transfers  PATIENT GOALS: get back to normal thing, be able to move better in general, be able to walk more smoothly   NEXT MD VISIT: Dr. Autumn Messing 7/23  OBJECTIVE:   DIAGNOSTIC FINDINGS:   PATIENT SURVEYS:  FOTO 43.3  COGNITION: Overall cognitive status: Within functional limits for tasks assessed     SENSATION: Not tested  EDEMA:  Appropriate for post-op state   No signs of redness/drainage/infection at incision, steri strips still in place    LOWER EXTREMITY ROM:  Active ROM Right eval Left eval  Hip flexion    Hip extension    Hip abduction    Hip adduction    Hip internal rotation    Hip external rotation    Knee flexion 72* supine heel slide    Knee extension 8* supine with heel prop   Ankle dorsiflexion    Ankle plantarflexion    Ankle inversion    Ankle eversion     (Blank rows = not  tested)  LOWER EXTREMITY MMT:  MMT Right eval Left eval  Hip flexion 3 3  Hip extension    Hip abduction    Hip adduction    Hip internal rotation    Hip external rotation    Knee flexion 3- 4+  Knee extension 3 4+  Ankle dorsiflexion 5 5  Ankle plantarflexion    Ankle inversion    Ankle eversion     (Blank rows = not tested)     TODAY'S TREATMENT:  DATE:   Eval  Objective measures, appropriate education, care planning   TherEx  Nustep L1 x6 minutes BUEs/BLEs for knee ROM Knee self flexion AAROM x10 with 3 second holds seated LAQs 0# x10 with 3 second holds Demonstrated set up for knee extension stretch from HEP    PATIENT EDUCATION:  Education details: exam findings, POC, HEP  Person educated: Patient Education method: Explanation, Demonstration, and Handouts Education comprehension: verbalized understanding and returned demonstration  HOME EXERCISE PROGRAM: Access Code: [email or web address] URL: [email or web address] Date: 06/14/2023 Prepared by: Nedra Hai  Exercises - Seated Knee Flexion AAROM  - 2 x daily - 7 x weekly - 1 sets - 10 reps - 3 hold - Seated Long Arc Quad  - 2 x daily - 7 x weekly - 1 sets - 10 reps - 3 hold - Seated Passive Knee Extension  - 2 x daily - 7 x weekly - 1 sets - 1 reps - 3-4 minutes hold  ASSESSMENT:  CLINICAL IMPRESSION: Patient is a 77 y.o. F who was seen today for physical therapy evaluation and treatment for skilled PT care s/p R TKA. Exam is typical and as expected following this surgical procedure. Will benefit from skilled PT services to address all impairments and assist in return to PLOF.    OBJECTIVE IMPAIRMENTS: Abnormal gait, decreased activity tolerance, decreased balance, decreased knowledge of use of DME, decreased mobility, difficulty walking, decreased ROM, decreased strength,  increased edema, increased fascial restrictions, obesity, and pain.   ACTIVITY LIMITATIONS: sitting, standing, squatting, stairs, transfers, bed mobility, and locomotion level  PARTICIPATION LIMITATIONS: cleaning, laundry, driving, shopping, community activity, yard work, and church  PERSONAL FACTORS: Age, Behavior pattern, Education, Fitness, Past/current experiences, Social background, and Time since onset of injury/illness/exacerbation are also affecting patient's functional outcome.   REHAB POTENTIAL: Good  CLINICAL DECISION MAKING: Stable/uncomplicated  EVALUATION COMPLEXITY: Low   GOALS: Goals reviewed with patient? Yes  SHORT TERM GOALS: Target date: 07/05/2023   Will be compliant with appropriate progressive HEP  Baseline: Goal status: INITIAL  2.  R knee AROM to be no more than 5* extension and 100* flexion  Baseline:  Goal status: INITIAL  3.  Will be able to ambulate household distances with SPC, good gait mechanics and no increase in pain  Baseline:  Goal status: INITIAL  4.  Will be independent in edema management with regular icing and elevation Baseline:  Goal status: INITIAL    LONG TERM GOALS: Target date: 07/26/2023    MMT to improve by one grade in all weak groups  Baseline:  Goal status: INITIAL  2.  R knee flexion AROM to be at least 110* Baseline:  Goal status: INITIAL  3.  Will be able to navigate at least 2 steps without rails and LRAD without difficulty  Baseline:  Goal status: INITIAL  4.  Will tolerate kneeling tasks and will be able to come back up to standing without difficulty and no increase in pain R knee to allow her to return to yardwork and gardening tasks  Baseline:  Goal status: INITIAL  5.  FOTO score to be within 5 points of predicted to show improved subjective status  Baseline:  Goal status: INITIAL     PLAN:  PT FREQUENCY:  3x/week for 3 weeks, then 2x/week for the next 3 weeks   PT DURATION: 6  weeks  PLANNED INTERVENTIONS: Therapeutic exercises, Therapeutic activity, Neuromuscular re-education, Balance training, Gait training, Patient/Family education, Self Care, Joint mobilization, Stair training, DME instructions, Dry  Needling, Electrical stimulation, Cryotherapy, Moist heat, Taping, Ultrasound, Ionotophoresis 4mg /ml Dexamethasone, Manual therapy, and Re-evaluation  PLAN FOR NEXT SESSION: R knee ROM, functional strength, gait training, edema control   Nedra Hai, PT, DPT 06/14/23 11:10 AM

## 2023-06-17 ENCOUNTER — Ambulatory Visit: Payer: PPO | Admitting: Physical Therapy

## 2023-06-17 DIAGNOSIS — M25561 Pain in right knee: Secondary | ICD-10-CM | POA: Diagnosis not present

## 2023-06-17 DIAGNOSIS — M25661 Stiffness of right knee, not elsewhere classified: Secondary | ICD-10-CM

## 2023-06-17 DIAGNOSIS — R600 Localized edema: Secondary | ICD-10-CM

## 2023-06-17 DIAGNOSIS — R262 Difficulty in walking, not elsewhere classified: Secondary | ICD-10-CM

## 2023-06-17 NOTE — Therapy (Signed)
OUTPATIENT PHYSICAL THERAPY LOWER EXTREMITY EVALUATION   Patient Name: TANNIA CONTINO MRN: [id number] DOB:08-31-1946, 77 y.o., female Today's Date: 06/17/2023  END OF SESSION:  PT End of Session - 06/17/23 1646     Visit Number 2    Number of Visits 16    Date for PT Re-Evaluation 07/26/23    Authorization Type HTA MCR    Authorization Time Period 06/14/23 to 07/26/23    PT Start Time 0232    PT Stop Time 0321    PT Time Calculation (min) 49 min    Activity Tolerance Patient tolerated treatment well    Behavior During Therapy [facility] for tasks assessed/performed             Past Medical History:  Diagnosis Date   Anxiety    Atrial fibrillation (HCC)    Cancer (HCC) 11/2020   left breast IMC   GERD (gastroesophageal reflux disease)    HTN (hypertension)    Hyperlipidemia    Personal history of radiation therapy    TIA (transient ischemic attack)    Past Surgical History:  Procedure Laterality Date   BOTOX INJECTION N/A 12/22/2018   Procedure: BOTOX INJECTION INTO ANAL SPHINCTER;  Surgeon: Andria Meuse, MD;  Location: [facility];  Service: General;  Laterality: N/A;   BREAST BIOPSY Left 11/26/2020   BREAST LUMPECTOMY WITH RADIOACTIVE SEED AND SENTINEL LYMPH NODE BIOPSY Left 01/07/2021   Procedure: LEFT BREAST LUMPECTOMY WITH RADIOACTIVE SEED AND LEFT AXILLARY SENTINEL LYMPH NODE BIOPSY WITH BLUE DYE INJECTION;  Surgeon: Manus Rudd, MD;  Location: [facility];  Service: General;  Laterality: Left;  LMA WITH PECTORAL BLOCK, BLUE DYE INJECTION   CARDIOVERSION N/A 01/04/2018   Procedure: CARDIOVERSION;  Surgeon: Lars Masson, MD;  Location: [facility] END[facility]COPY;  Service: Cardiovascular;  Laterality: N/A;   CARDIOVERSION N/A 09/20/2018   Procedure: CARDIOVERSION;  Surgeon: Quintella Reichert, MD;  Location: [facility] ENDOSCOPY;  Service: Cardiovascular;  Laterality: N/A;   CARDIOVERSION N/A 10/27/2018   Procedure: CARDIOVERSION;  Surgeon: Jodelle Red, MD;   Location: [facility] ENDOSCOPY;  Service: Cardiovascular;  Laterality: N/A;   KNEE SURGERY Bilateral    Torn miniscus   RECTAL EXAM UNDER ANESTHESIA N/A 12/22/2018   Procedure: ANORECTAL  EXAM UNDER ANESTHESIA;  Surgeon: Andria Meuse, MD;  Location: [facility];  Service: General;  Laterality: N/A;   RECTOCELE REPAIR     TONSILLECTOMY     VAGINAL HYSTERECTOMY     Patient Active Problem List   Diagnosis Date Noted   Hypercoagulable state due to persistent atrial fibrillation (HCC) 04/13/2023   Adenomatous polyp of colon 04/30/2022   Esophageal reflux 04/30/2022   Family history of malignant neoplasm of digestive organs 04/30/2022   History of cellulitis 04/28/2022   Encounter for monitoring dofetilide therapy 07/04/2021   Spleen injury 06/29/2021   Carcinoma of upper-inner quadrant of left breast in female, estrogen receptor positive (HCC) 12/20/2020   Trigger thumb of left hand 06/28/2019   Depression, major, single episode, mild (HCC) 03/14/2019   Generalized anxiety disorder 03/14/2019   PAF (paroxysmal atrial fibrillation) (HCC)    Osteoarthritis of knee 04/20/2018   Post-menopausal 02/22/2018   Snoring 01/12/2018   Hypokalemia 01/12/2018   Encounter for cardioversion procedure    Persistent atrial fibrillation 11/11/2017   SOB (shortness of breath) 11/11/2017   Other fatigue 11/11/2017   Hyperlipemia 04/16/2017   Weakness of left hand 05/11/2016   Anxiety related tremor 05/11/2016   Essential hypertension    Normochromic  normocytic anemia    TIA (transient ischemic attack) 02/26/2016    PCP: Delynn Flavin DO   REFERRING PROVIDER: Doristine Counter, MD  REFERRING DIAG: M17.11 (ICD-10-CM) - Unilateral primary osteoarthritis, right knee  THERAPY DIAG:  Acute pain of right knee  Stiffness of right knee, not elsewhere classified  Difficulty in walking, not elsewhere classified  Localized edema  Rationale for Evaluation and Treatment: Rehabilitation  ONSET DATE: June  21st 2024  SUBJECTIVE:   SUBJECTIVE STATEMENT: No new complaints.  PERTINENT HISTORY: Anxiety, Afib, CA, HTN, HLD, TIA, breast lumpectomy with radioactive seed and sentinel lymph node biopsy, knee surgery, cardioversion PAIN:  Are you having pain? Yes: NPRS scale: 1-2/10 Pain location: back of R LE  Pain description: discomfort Aggravating factors: pressure on the area Relieving factors: standing up  PRECAUTIONS: None  WEIGHT BEARING RESTRICTIONS: No  FALLS:  Has patient fallen in last 6 months? No  LIVING ENVIRONMENT: Lives with: lives alone Lives in: House/apartment Stairs: 2 STE to get to porch, then one more step to enter  Has following equipment at home: Single point cane and Environmental consultant - 2 wheeled  OCCUPATION: retired   PLOF: Independent, Independent with basic ADLs, Independent with gait, and Independent with transfers  PATIENT GOALS: get back to normal thing, be able to move better in general, be able to walk more smoothly   NEXT MD VISIT: Dr. Autumn Messing 7/23  OBJECTIVE:   DIAGNOSTIC FINDINGS:   PATIENT SURVEYS:  FOTO 43.3  COGNITION: Overall cognitive status: Within functional limits for tasks assessed     SENSATION: Not tested  EDEMA:  Appropriate for post-op state   No signs of redness/drainage/infection at incision, steri strips still in place    LOWER EXTREMITY ROM:  Active ROM Right eval Left eval  Hip flexion    Hip extension    Hip abduction    Hip adduction    Hip internal rotation    Hip external rotation    Knee flexion 72* supine heel slide    Knee extension 8* supine with heel prop   Ankle dorsiflexion    Ankle plantarflexion    Ankle inversion    Ankle eversion     (Blank rows = not tested)  LOWER EXTREMITY MMT:  MMT Right eval Left eval  Hip flexion 3 3  Hip extension    Hip abduction    Hip adduction    Hip internal rotation    Hip external rotation    Knee flexion 3- 4+  Knee extension 3 4+  Ankle dorsiflexion 5 5   Ankle plantarflexion    Ankle inversion    Ankle eversion     (Blank rows = not tested)     TODAY'S TREATMENT:                                                                                                                              DATE:   Nustep level 1 x 15  minutes moving seat forward x 2 to increase flexion f/b PROM (LLLDS technique) x 11 minutes f/b  LE elevation and vasopneumatic x 15 minutes.     PATIENT EDUCATION:  Education details: exam findings, POC, HEP  Person educated: Patient Education method: Explanation, Demonstration, and Handouts Education comprehension: verbalized understanding and returned demonstration  HOME EXERCISE PROGRAM: Access Code: [email or web address] URL: [email or web address] Date: 06/14/2023 Prepared by: Nedra Hai  Exercises - Seated Knee Flexion AAROM  - 2 x daily - 7 x weekly - 1 sets - 10 reps - 3 hold - Seated Long Arc Quad  - 2 x daily - 7 x weekly - 1 sets - 10 reps - 3 hold - Seated Passive Knee Extension  - 2 x daily - 7 x weekly - 1 sets - 1 reps - 3-4 minutes hold  ASSESSMENT:  CLINICAL IMPRESSION: Patient did very well today and achieved right knee flexion to 105 degrees today.  OBJECTIVE IMPAIRMENTS: Abnormal gait, decreased activity tolerance, decreased balance, decreased knowledge of use of DME, decreased mobility, difficulty walking, decreased ROM, decreased strength, increased edema, increased fascial restrictions, obesity, and pain.   ACTIVITY LIMITATIONS: sitting, standing, squatting, stairs, transfers, bed mobility, and locomotion level  PARTICIPATION LIMITATIONS: cleaning, laundry, driving, shopping, community activity, yard work, and church  PERSONAL FACTORS: Age, Behavior pattern, Education, Fitness, Past/current experiences, Social background, and Time since onset of injury/illness/exacerbation are also affecting patient's functional outcome.   REHAB POTENTIAL: Good  CLINICAL DECISION MAKING:  Stable/uncomplicated  EVALUATION COMPLEXITY: Low   GOALS: Goals reviewed with patient? Yes  SHORT TERM GOALS: Target date: 07/05/2023   Will be compliant with appropriate progressive HEP  Baseline: Goal status: INITIAL  2.  R knee AROM to be no more than 5* extension and 100* flexion  Baseline:  Goal status: INITIAL  3.  Will be able to ambulate household distances with SPC, good gait mechanics and no increase in pain  Baseline:  Goal status: INITIAL  4.  Will be independent in edema management with regular icing and elevation Baseline:  Goal status: INITIAL    LONG TERM GOALS: Target date: 07/26/2023    MMT to improve by one grade in all weak groups  Baseline:  Goal status: INITIAL  2.  R knee flexion AROM to be at least 110* Baseline:  Goal status: INITIAL  3.  Will be able to navigate at least 2 steps without rails and LRAD without difficulty  Baseline:  Goal status: INITIAL  4.  Will tolerate kneeling tasks and will be able to come back up to standing without difficulty and no increase in pain R knee to allow her to return to yardwork and gardening tasks  Baseline:  Goal status: INITIAL  5.  FOTO score to be within 5 points of predicted to show improved subjective status  Baseline:  Goal status: INITIAL     PLAN:  PT FREQUENCY:  3x/week for 3 weeks, then 2x/week for the next 3 weeks   PT DURATION: 6 weeks  PLANNED INTERVENTIONS: Therapeutic exercises, Therapeutic activity, Neuromuscular re-education, Balance training, Gait training, Patient/Family education, Self Care, Joint mobilization, Stair training, DME instructions, Dry Needling, Electrical stimulation, Cryotherapy, Moist heat, Taping, Ultrasound, Ionotophoresis 4mg /ml Dexamethasone, Manual therapy, and Re-evaluation  PLAN FOR NEXT SESSION: R knee ROM, functional strength, gait training, edema control    [place] Mayling Aber MPT

## 2023-06-21 ENCOUNTER — Ambulatory Visit: Payer: PPO | Admitting: Physical Therapy

## 2023-06-21 DIAGNOSIS — M25561 Pain in right knee: Secondary | ICD-10-CM | POA: Diagnosis not present

## 2023-06-21 DIAGNOSIS — M25661 Stiffness of right knee, not elsewhere classified: Secondary | ICD-10-CM

## 2023-06-21 DIAGNOSIS — R600 Localized edema: Secondary | ICD-10-CM

## 2023-06-21 DIAGNOSIS — R262 Difficulty in walking, not elsewhere classified: Secondary | ICD-10-CM

## 2023-06-21 NOTE — Therapy (Signed)
OUTPATIENT PHYSICAL THERAPY LOWER EXTREMITY EVALUATION   Patient Name: [name] Collier MRN: [id number] DOB:12-May-1946, 77 y.o., female Today's Date: 06/21/2023  END OF SESSION:  PT End of Session - 06/21/23 1142     Visit Number 3    Number of Visits 16    Date for PT Re-Evaluation 07/26/23    PT Start Time 1100    PT Stop Time 1153    PT Time Calculation (min) 53 min    Activity Tolerance Patient tolerated treatment well    Behavior During Therapy [facility] for tasks assessed/performed             Past Medical History:  Diagnosis Date   Anxiety    Atrial fibrillation (HCC)    Cancer (HCC) 11/2020   left breast IMC   GERD (gastroesophageal reflux disease)    HTN (hypertension)    Hyperlipidemia    Personal history of radiation therapy    TIA (transient ischemic attack)    Past Surgical History:  Procedure Laterality Date   BOTOX INJECTION N/A 12/22/2018   Procedure: BOTOX INJECTION INTO ANAL SPHINCTER;  Surgeon: Andria Meuse, MD;  Location: [facility];  Service: General;  Laterality: N/A;   BREAST BIOPSY Left 11/26/2020   BREAST LUMPECTOMY WITH RADIOACTIVE SEED AND SENTINEL LYMPH NODE BIOPSY Left 01/07/2021   Procedure: LEFT BREAST LUMPECTOMY WITH RADIOACTIVE SEED AND LEFT AXILLARY SENTINEL LYMPH NODE BIOPSY WITH BLUE DYE INJECTION;  Surgeon: Manus Rudd, MD;  Location: [facility];  Service: General;  Laterality: Left;  LMA WITH PECTORAL BLOCK, BLUE DYE INJECTION   CARDIOVERSION N/A 01/04/2018   Procedure: CARDIOVERSION;  Surgeon: Lars Masson, MD;  Location: [facility]COPY;  Service: Cardiovascular;  Laterality: N/A;   CARDIOVERSION N/A 09/20/2018   Procedure: CARDIOVERSION;  Surgeon: Quintella Reichert, MD;  Location: [facility];  Service: Cardiovascular;  Laterality: N/A;   CARDIOVERSION N/A 10/27/2018   Procedure: CARDIOVERSION;  Surgeon: Jodelle Red, MD;  Location: [facility] ENDOSCOPY;  Service: Cardiovascular;  Laterality: N/A;   KNEE SURGERY  Bilateral    Torn miniscus   RECTAL EXAM UNDER ANESTHESIA N/A 12/22/2018   Procedure: ANORECTAL  EXAM UNDER ANESTHESIA;  Surgeon: Andria Meuse, MD;  Location: [facility];  Service: General;  Laterality: N/A;   RECTOCELE REPAIR     TONSILLECTOMY     VAGINAL HYSTERECTOMY     Patient Active Problem List   Diagnosis Date Noted   Hypercoagulable state due to persistent atrial fibrillation (HCC) 04/13/2023   Adenomatous polyp of colon 04/30/2022   Esophageal reflux 04/30/2022   Family history of malignant neoplasm of digestive organs 04/30/2022   History of cellulitis 04/28/2022   Encounter for monitoring dofetilide therapy 07/04/2021   Spleen injury 06/29/2021   Carcinoma of upper-inner quadrant of left breast in female, estrogen receptor positive (HCC) 12/20/2020   Trigger thumb of left hand 06/28/2019   Depression, major, single episode, mild (HCC) 03/14/2019   Generalized anxiety disorder 03/14/2019   PAF (paroxysmal atrial fibrillation) (HCC)    Osteoarthritis of knee 04/20/2018   Post-menopausal 02/22/2018   Snoring 01/12/2018   Hypokalemia 01/12/2018   Encounter for cardioversion procedure    Persistent atrial fibrillation 11/11/2017   SOB (shortness of breath) 11/11/2017   Other fatigue 11/11/2017   Hyperlipemia 04/16/2017   Weakness of left hand 05/11/2016   Anxiety related tremor 05/11/2016   Essential hypertension    Normochromic normocytic anemia    TIA (transient ischemic attack) 02/26/2016    PCP: Delynn Flavin  DO   REFERRING PROVIDER: Doristine Counter, MD  REFERRING DIAG: M17.11 (ICD-10-CM) - Unilateral primary osteoarthritis, right knee  THERAPY DIAG:  Acute pain of right knee  Stiffness of right knee, not elsewhere classified  Difficulty in walking, not elsewhere classified  Localized edema  Rationale for Evaluation and Treatment: Rehabilitation  ONSET DATE: June 21st 2024  SUBJECTIVE:   SUBJECTIVE STATEMENT: Pain complaints in distal lateral  hamstring region.  PERTINENT HISTORY: Anxiety, Afib, CA, HTN, HLD, TIA, breast lumpectomy with radioactive seed and sentinel lymph node biopsy, knee surgery, cardioversion PAIN:  Are you having pain? Yes: NPRS scale: 2-3/10 Pain location: Right distal/lateral hamstring. Pain description: discomfort Aggravating factors: pressure on the area Relieving factors: standing up  PRECAUTIONS: None  WEIGHT BEARING RESTRICTIONS: No  FALLS:  Has patient fallen in last 6 months? No  LIVING ENVIRONMENT: Lives with: lives alone Lives in: House/apartment Stairs: 2 STE to get to porch, then one more step to enter  Has following equipment at home: Single point cane and Environmental consultant - 2 wheeled  OCCUPATION: retired   PLOF: Independent, Independent with basic ADLs, Independent with gait, and Independent with transfers  PATIENT GOALS: get back to normal thing, be able to move better in general, be able to walk more smoothly   NEXT MD VISIT: Dr. Autumn Messing 7/23  OBJECTIVE:   DIAGNOSTIC FINDINGS:   PATIENT SURVEYS:  FOTO 43.3  COGNITION: Overall cognitive status: Within functional limits for tasks assessed     SENSATION: Not tested  EDEMA:  Appropriate for post-op state   No signs of redness/drainage/infection at incision, steri strips still in place    LOWER EXTREMITY ROM:  Active ROM Right eval Left eval  Hip flexion    Hip extension    Hip abduction    Hip adduction    Hip internal rotation    Hip external rotation    Knee flexion 72* supine heel slide    Knee extension 8* supine with heel prop   Ankle dorsiflexion    Ankle plantarflexion    Ankle inversion    Ankle eversion     (Blank rows = not tested)  LOWER EXTREMITY MMT:  MMT Right eval Left eval  Hip flexion 3 3  Hip extension    Hip abduction    Hip adduction    Hip internal rotation    Hip external rotation    Knee flexion 3- 4+  Knee extension 3 4+  Ankle dorsiflexion 5 5  Ankle plantarflexion    Ankle  inversion    Ankle eversion     (Blank rows = not tested)     TODAY'S TREATMENT:                                                                                                                              DATE:   Nustep level 1 x 17 minutes moving seat forward x 2 to increase flexion f/b STW/M x 5 minutes  to patient's right distal hamstring region f/b PROM x 10 minutes f/b LE elevation and  vasopneumatic x 15 minutes.       PATIENT EDUCATION:  Education details: exam findings, POC, HEP  Person educated: Patient Education method: Explanation, Demonstration, and Handouts Education comprehension: verbalized understanding and returned demonstration  HOME EXERCISE PROGRAM: Access Code: [email or web address] URL: [email or web address] Date: 06/14/2023 Prepared by: Nedra Hai  Exercises - Seated Knee Flexion AAROM  - 2 x daily - 7 x weekly - 1 sets - 10 reps - 3 hold - Seated Long Arc Quad  - 2 x daily - 7 x weekly - 1 sets - 10 reps - 3 hold - Seated Passive Knee Extension  - 2 x daily - 7 x weekly - 1 sets - 1 reps - 3-4 minutes hold  ASSESSMENT:  CLINICAL IMPRESSION: Patient with palpable tenderness over right distal/lateral hamstring region which felt better after STW/M.  Her knee knee with gentle passive stretch improving to 110 degrees.  OBJECTIVE IMPAIRMENTS: Abnormal gait, decreased activity tolerance, decreased balance, decreased knowledge of use of DME, decreased mobility, difficulty walking, decreased ROM, decreased strength, increased edema, increased fascial restrictions, obesity, and pain.   ACTIVITY LIMITATIONS: sitting, standing, squatting, stairs, transfers, bed mobility, and locomotion level  PARTICIPATION LIMITATIONS: cleaning, laundry, driving, shopping, community activity, yard work, and church  PERSONAL FACTORS: Age, Behavior pattern, Education, Fitness, Past/current experiences, Social background, and Time since onset of injury/illness/exacerbation are  also affecting patient's functional outcome.   REHAB POTENTIAL: Good  CLINICAL DECISION MAKING: Stable/uncomplicated  EVALUATION COMPLEXITY: Low   GOALS: Goals reviewed with patient? Yes  SHORT TERM GOALS: Target date: 07/05/2023   Will be compliant with appropriate progressive HEP  Baseline: Goal status: INITIAL  2.  R knee AROM to be no more than 5* extension and 100* flexion  Baseline:  Goal status: INITIAL  3.  Will be able to ambulate household distances with SPC, good gait mechanics and no increase in pain  Baseline:  Goal status: INITIAL  4.  Will be independent in edema management with regular icing and elevation Baseline:  Goal status: INITIAL    LONG TERM GOALS: Target date: 07/26/2023    MMT to improve by one grade in all weak groups  Baseline:  Goal status: INITIAL  2.  R knee flexion AROM to be at least 110* Baseline:  Goal status: INITIAL  3.  Will be able to navigate at least 2 steps without rails and LRAD without difficulty  Baseline:  Goal status: INITIAL  4.  Will tolerate kneeling tasks and will be able to come back up to standing without difficulty and no increase in pain R knee to allow her to return to yardwork and gardening tasks  Baseline:  Goal status: INITIAL  5.  FOTO score to be within 5 points of predicted to show improved subjective status  Baseline:  Goal status: INITIAL     PLAN:  PT FREQUENCY:  3x/week for 3 weeks, then 2x/week for the next 3 weeks   PT DURATION: 6 weeks  PLANNED INTERVENTIONS: Therapeutic exercises, Therapeutic activity, Neuromuscular re-education, Balance training, Gait training, Patient/Family education, Self Care, Joint mobilization, Stair training, DME instructions, Dry Needling, Electrical stimulation, Cryotherapy, Moist heat, Taping, Ultrasound, Ionotophoresis 4mg /ml Dexamethasone, Manual therapy, and Re-evaluation  PLAN FOR NEXT SESSION: R knee ROM, functional strength, gait training, edema  control    [place] Cordie Beazley MPT

## 2023-06-22 ENCOUNTER — Ambulatory Visit: Payer: Medicare Other | Admitting: Hematology and Oncology

## 2023-06-23 ENCOUNTER — Inpatient Hospital Stay: Payer: PPO | Admitting: Hematology and Oncology

## 2023-06-24 ENCOUNTER — Ambulatory Visit: Payer: PPO | Admitting: *Deleted

## 2023-06-24 DIAGNOSIS — R262 Difficulty in walking, not elsewhere classified: Secondary | ICD-10-CM

## 2023-06-24 DIAGNOSIS — M25561 Pain in right knee: Secondary | ICD-10-CM

## 2023-06-24 DIAGNOSIS — M25661 Stiffness of right knee, not elsewhere classified: Secondary | ICD-10-CM

## 2023-06-24 NOTE — Therapy (Signed)
OUTPATIENT PHYSICAL THERAPY LOWER EXTREMITY TREATMENT   Patient Name: [name] Collier MRN: [id number] DOB:18-Aug-1946, 77 y.o., female Today's Date: 06/24/2023  END OF SESSION:  PT End of Session - 06/24/23 1103     Visit Number 4    Number of Visits 16    Date for PT Re-Evaluation 07/26/23    Authorization Type HTA MCR    Authorization Time Period 06/14/23 to 07/26/23    Progress Note Due on Visit 10    PT Start Time 1100    PT Stop Time 1152    PT Time Calculation (min) 52 min             Past Medical History:  Diagnosis Date   Anxiety    Atrial fibrillation (HCC)    Cancer (HCC) 11/2020   left breast IMC   GERD (gastroesophageal reflux disease)    HTN (hypertension)    Hyperlipidemia    Personal history of radiation therapy    TIA (transient ischemic attack)    Past Surgical History:  Procedure Laterality Date   BOTOX INJECTION N/A 12/22/2018   Procedure: BOTOX INJECTION INTO ANAL SPHINCTER;  Surgeon: Andria Meuse, MD;  Location: [facility];  Service: General;  Laterality: N/A;   BREAST BIOPSY Left 11/26/2020   BREAST LUMPECTOMY WITH RADIOACTIVE SEED AND SENTINEL LYMPH NODE BIOPSY Left 01/07/2021   Procedure: LEFT BREAST LUMPECTOMY WITH RADIOACTIVE SEED AND LEFT AXILLARY SENTINEL LYMPH NODE BIOPSY WITH BLUE DYE INJECTION;  Surgeon: Manus Rudd, MD;  Location: [facility];  Service: General;  Laterality: Left;  LMA WITH PECTORAL BLOCK, BLUE DYE INJECTION   CARDIOVERSION N/A 01/04/2018   Procedure: CARDIOVERSION;  Surgeon: Lars Masson, MD;  Location: [facility] END[facility]COPY;  Service: Cardiovascular;  Laterality: N/A;   CARDIOVERSION N/A 09/20/2018   Procedure: CARDIOVERSION;  Surgeon: Quintella Reichert, MD;  Location: [facility]COPY;  Service: Cardiovascular;  Laterality: N/A;   CARDIOVERSION N/A 10/27/2018   Procedure: CARDIOVERSION;  Surgeon: Jodelle Red, MD;  Location: [facility] ENDOSCOPY;  Service: Cardiovascular;  Laterality: N/A;   KNEE SURGERY  Bilateral    Torn miniscus   RECTAL EXAM UNDER ANESTHESIA N/A 12/22/2018   Procedure: ANORECTAL  EXAM UNDER ANESTHESIA;  Surgeon: Andria Meuse, MD;  Location: [facility];  Service: General;  Laterality: N/A;   RECTOCELE REPAIR     TONSILLECTOMY     VAGINAL HYSTERECTOMY     Patient Active Problem List   Diagnosis Date Noted   Hypercoagulable state due to persistent atrial fibrillation (HCC) 04/13/2023   Adenomatous polyp of colon 04/30/2022   Esophageal reflux 04/30/2022   Family history of malignant neoplasm of digestive organs 04/30/2022   History of cellulitis 04/28/2022   Encounter for monitoring dofetilide therapy 07/04/2021   Spleen injury 06/29/2021   Carcinoma of upper-inner quadrant of left breast in female, estrogen receptor positive (HCC) 12/20/2020   Trigger thumb of left hand 06/28/2019   Depression, major, single episode, mild (HCC) 03/14/2019   Generalized anxiety disorder 03/14/2019   PAF (paroxysmal atrial fibrillation) (HCC)    Osteoarthritis of knee 04/20/2018   Post-menopausal 02/22/2018   Snoring 01/12/2018   Hypokalemia 01/12/2018   Encounter for cardioversion procedure    Persistent atrial fibrillation 11/11/2017   SOB (shortness of breath) 11/11/2017   Other fatigue 11/11/2017   Hyperlipemia 04/16/2017   Weakness of left hand 05/11/2016   Anxiety related tremor 05/11/2016   Essential hypertension    Normochromic normocytic anemia    TIA (transient ischemic attack) 02/26/2016  PCP: Delynn Flavin DO   REFERRING PROVIDER: Doristine Counter, MD  REFERRING DIAG: M17.11 (ICD-10-CM) - Unilateral primary osteoarthritis, right knee  THERAPY DIAG:  Acute pain of right knee  Stiffness of right knee, not elsewhere classified  Difficulty in walking, not elsewhere classified  Rationale for Evaluation and Treatment: Rehabilitation  ONSET DATE: June 21st 2024  SUBJECTIVE:   SUBJECTIVE STATEMENT: Patient reports doing okay with RT knee. Still sore.  TO MD Tuesday   06-29-23  PERTINENT HISTORY: Anxiety, Afib, CA, HTN, HLD, TIA, breast lumpectomy with radioactive seed and sentinel lymph node biopsy, knee surgery, cardioversion PAIN:  Are you having pain? Yes: NPRS scale: 2-3/10 Pain location: Right distal/lateral hamstring. Pain description: discomfort Aggravating factors: pressure on the area Relieving factors: standing up  PRECAUTIONS: None  WEIGHT BEARING RESTRICTIONS: No  FALLS:  Has patient fallen in last 6 months? No  LIVING ENVIRONMENT: Lives with: lives alone Lives in: House/apartment Stairs: 2 STE to get to porch, then one more step to enter  Has following equipment at home: Single point cane and Environmental consultant - 2 wheeled  OCCUPATION: retired   PLOF: Independent, Independent with basic ADLs, Independent with gait, and Independent with transfers  PATIENT GOALS: get back to normal thing, be able to move better in general, be able to walk more smoothly   NEXT MD VISIT: Dr. Autumn Messing 7/23  OBJECTIVE:   DIAGNOSTIC FINDINGS:   PATIENT SURVEYS:  FOTO 43.3  COGNITION: Overall cognitive status: Within functional limits for tasks assessed     SENSATION: Not tested  EDEMA:  Appropriate for post-op state   No signs of redness/drainage/infection at incision, steri strips still in place    LOWER EXTREMITY ROM:  Active ROM Right eval Left eval  Hip flexion    Hip extension    Hip abduction    Hip adduction    Hip internal rotation    Hip external rotation    Knee flexion 72* supine heel slide    Knee extension 8* supine with heel prop   Ankle dorsiflexion    Ankle plantarflexion    Ankle inversion    Ankle eversion     (Blank rows = not tested)  LOWER EXTREMITY MMT:  MMT Right eval Left eval  Hip flexion 3 3  Hip extension    Hip abduction    Hip adduction    Hip internal rotation    Hip external rotation    Knee flexion 3- 4+  Knee extension 3 4+  Ankle dorsiflexion 5 5  Ankle plantarflexion     Ankle inversion    Ankle eversion     (Blank rows = not tested)     TODAY'S TREATMENT:                                                                                                                              DATE:  06-24-23                                    EXERCISE LOG  Exercise Repetitions and Resistance Comments  nustep L4 x 15 mins seat 8,9   Rocker board X 3 mins calf stretching   LAQ 2# 3x10            Blank cell = exercise not performed today   STW to Rt knee with focus on HS, PROM  for extension ROM  all x  10 minutes   LE elevation and  vasopneumatic x 15 minutes.       PATIENT EDUCATION:  Education details: exam findings, POC, HEP  Person educated: Patient Education method: Explanation, Demonstration, and Handouts Education comprehension: verbalized understanding and returned demonstration  HOME EXERCISE PROGRAM: Access Code: [email or web address] URL: [email or web address] Date: 06/14/2023 Prepared by: Nedra Hai  Exercises - Seated Knee Flexion AAROM  - 2 x daily - 7 x weekly - 1 sets - 10 reps - 3 hold - Seated Long Arc Quad  - 2 x daily - 7 x weekly - 1 sets - 10 reps - 3 hold - Seated Passive Knee Extension  - 2 x daily - 7 x weekly - 1 sets - 1 reps - 3-4 minutes hold  ASSESSMENT:  CLINICAL IMPRESSION: Patient arrived today doing fairly well with RT TKR. Rx focused on ROM progression as well as quad activation and light strengthening. Pt still with sore/tight lateral HS RT knee. Vaso end of session.    OBJECTIVE IMPAIRMENTS: Abnormal gait, decreased activity tolerance, decreased balance, decreased knowledge of use of DME, decreased mobility, difficulty walking, decreased ROM, decreased strength, increased edema, increased fascial restrictions, obesity, and pain.   ACTIVITY LIMITATIONS: sitting, standing, squatting, stairs, transfers, bed mobility, and locomotion  level  PARTICIPATION LIMITATIONS: cleaning, laundry, driving, shopping, community activity, yard work, and church  PERSONAL FACTORS: Age, Behavior pattern, Education, Fitness, Past/current experiences, Social background, and Time since onset of injury/illness/exacerbation are also affecting patient's functional outcome.   REHAB POTENTIAL: Good  CLINICAL DECISION MAKING: Stable/uncomplicated  EVALUATION COMPLEXITY: Low   GOALS: Goals reviewed with patient? Yes  SHORT TERM GOALS: Target date: 07/05/2023   Will be compliant with appropriate progressive HEP  Baseline: Goal status: INITIAL  2.  R knee AROM to be no more than 5* extension and 100* flexion  Baseline:  Goal status: INITIAL  3.  Will be able to ambulate household distances with SPC, good gait mechanics and no increase in pain  Baseline:  Goal status: INITIAL  4.  Will be independent in edema management with regular icing and elevation Baseline:  Goal status: INITIAL    LONG TERM GOALS: Target date: 07/26/2023    MMT to improve by one grade in all weak groups  Baseline:  Goal status: INITIAL  2.  R knee flexion AROM to be at least 110* Baseline:  Goal status: INITIAL  3.  Will be able to navigate at least 2 steps without rails and LRAD without difficulty  Baseline:  Goal status: INITIAL  4.  Will tolerate kneeling tasks and will be able to come back up to standing without difficulty and no increase in pain R knee to allow her to return to yardwork and gardening tasks  Baseline:  Goal status: INITIAL  5.  FOTO score to be within 5 points of predicted to show improved subjective status  Baseline:  Goal status: INITIAL     PLAN:  PT FREQUENCY:  3x/week for 3 weeks, then 2x/week for the next 3 weeks   PT DURATION: 6 weeks  PLANNED INTERVENTIONS: Therapeutic exercises, Therapeutic activity, Neuromuscular re-education, Balance training, Gait training, Patient/Family education, Self Care, Joint  mobilization, Stair training, DME instructions, Dry Needling, Electrical stimulation, Cryotherapy, Moist heat, Taping, Ultrasound, Ionotophoresis 4mg /ml Dexamethasone, Manual therapy, and Re-evaluation  PLAN FOR NEXT SESSION: R knee ROM, functional strength, gait training, edema control

## 2023-06-28 ENCOUNTER — Ambulatory Visit: Payer: PPO | Admitting: *Deleted

## 2023-06-28 ENCOUNTER — Encounter: Payer: Self-pay | Admitting: *Deleted

## 2023-06-28 DIAGNOSIS — R262 Difficulty in walking, not elsewhere classified: Secondary | ICD-10-CM

## 2023-06-28 DIAGNOSIS — M25561 Pain in right knee: Secondary | ICD-10-CM | POA: Diagnosis not present

## 2023-06-28 DIAGNOSIS — R600 Localized edema: Secondary | ICD-10-CM

## 2023-06-28 DIAGNOSIS — M25661 Stiffness of right knee, not elsewhere classified: Secondary | ICD-10-CM

## 2023-06-28 NOTE — Therapy (Addendum)
OUTPATIENT PHYSICAL THERAPY LOWER EXTREMITY TREATMENT   Patient Name: [name] Collier MRN: [id number] DOB:1946/03/02, 77 y.o., female Today's Date: 06/28/2023  END OF SESSION:  PT End of Session - 06/28/23 0934     Visit Number 5    Number of Visits 16    Date for PT Re-Evaluation 07/26/23    Authorization Type HTA MCR    Authorization Time Period 06/14/23 to 07/26/23    Progress Note Due on Visit 10    PT Start Time 0930    PT Stop Time 1034    PT Time Calculation (min) 64 min             Past Medical History:  Diagnosis Date   Anxiety    Atrial fibrillation (HCC)    Cancer (HCC) 11/2020   left breast IMC   GERD (gastroesophageal reflux disease)    HTN (hypertension)    Hyperlipidemia    Personal history of radiation therapy    TIA (transient ischemic attack)    Past Surgical History:  Procedure Laterality Date   BOTOX INJECTION N/A 12/22/2018   Procedure: BOTOX INJECTION INTO ANAL SPHINCTER;  Surgeon: Andria Meuse, MD;  Location: [facility];  Service: General;  Laterality: N/A;   BREAST BIOPSY Left 11/26/2020   BREAST LUMPECTOMY WITH RADIOACTIVE SEED AND SENTINEL LYMPH NODE BIOPSY Left 01/07/2021   Procedure: LEFT BREAST LUMPECTOMY WITH RADIOACTIVE SEED AND LEFT AXILLARY SENTINEL LYMPH NODE BIOPSY WITH BLUE DYE INJECTION;  Surgeon: Manus Rudd, MD;  Location: [facility];  Service: General;  Laterality: Left;  LMA WITH PECTORAL BLOCK, BLUE DYE INJECTION   CARDIOVERSION N/A 01/04/2018   Procedure: CARDIOVERSION;  Surgeon: Lars Masson, MD;  Location: [facility] ENDOSCOPY;  Service: Cardiovascular;  Laterality: N/A;   CARDIOVERSION N/A 09/20/2018   Procedure: CARDIOVERSION;  Surgeon: Quintella Reichert, MD;  Location: [facility];  Service: Cardiovascular;  Laterality: N/A;   CARDIOVERSION N/A 10/27/2018   Procedure: CARDIOVERSION;  Surgeon: Jodelle Red, MD;  Location: [facility] ENDOSCOPY;  Service: Cardiovascular;  Laterality: N/A;   KNEE SURGERY  Bilateral    Torn miniscus   RECTAL EXAM UNDER ANESTHESIA N/A 12/22/2018   Procedure: ANORECTAL  EXAM UNDER ANESTHESIA;  Surgeon: Andria Meuse, MD;  Location: [facility];  Service: General;  Laterality: N/A;   RECTOCELE REPAIR     TONSILLECTOMY     VAGINAL HYSTERECTOMY     Patient Active Problem List   Diagnosis Date Noted   Hypercoagulable state due to persistent atrial fibrillation (HCC) 04/13/2023   Adenomatous polyp of colon 04/30/2022   Esophageal reflux 04/30/2022   Family history of malignant neoplasm of digestive organs 04/30/2022   History of cellulitis 04/28/2022   Encounter for monitoring dofetilide therapy 07/04/2021   Spleen injury 06/29/2021   Carcinoma of upper-inner quadrant of left breast in female, estrogen receptor positive (HCC) 12/20/2020   Trigger thumb of left hand 06/28/2019   Depression, major, single episode, mild (HCC) 03/14/2019   Generalized anxiety disorder 03/14/2019   PAF (paroxysmal atrial fibrillation) (HCC)    Osteoarthritis of knee 04/20/2018   Post-menopausal 02/22/2018   Snoring 01/12/2018   Hypokalemia 01/12/2018   Encounter for cardioversion procedure    Persistent atrial fibrillation 11/11/2017   SOB (shortness of breath) 11/11/2017   Other fatigue 11/11/2017   Hyperlipemia 04/16/2017   Weakness of left hand 05/11/2016   Anxiety related tremor 05/11/2016   Essential hypertension    Normochromic normocytic anemia    TIA (transient ischemic attack) 02/26/2016  PCP: Delynn Flavin DO   REFERRING PROVIDER: Doristine Counter, MD  REFERRING DIAG: M17.11 (ICD-10-CM) - Unilateral primary osteoarthritis, right knee  THERAPY DIAG:  Acute pain of right knee  Stiffness of right knee, not elsewhere classified  Difficulty in walking, not elsewhere classified  Localized edema  Rationale for Evaluation and Treatment: Rehabilitation  ONSET DATE: June 21st 2024  SUBJECTIVE:   SUBJECTIVE STATEMENT: Patient reports doing okay with RT  knee. Still sore. TO MD  tomorrow  PERTINENT HISTORY: Anxiety, Afib, CA, HTN, HLD, TIA, breast lumpectomy with radioactive seed and sentinel lymph node biopsy, knee surgery, cardioversion PAIN:  Are you having pain? Yes: NPRS scale: 2-3/10 Pain location: Right distal/lateral hamstring. Pain description: discomfort Aggravating factors: pressure on the area Relieving factors: standing up  PRECAUTIONS: None  WEIGHT BEARING RESTRICTIONS: No  FALLS:  Has patient fallen in last 6 months? No  LIVING ENVIRONMENT: Lives with: lives alone Lives in: House/apartment Stairs: 2 STE to get to porch, then one more step to enter  Has following equipment at home: Single point cane and Environmental consultant - 2 wheeled  OCCUPATION: retired   PLOF: Independent, Independent with basic ADLs, Independent with gait, and Independent with transfers  PATIENT GOALS: get back to normal thing, be able to move better in general, be able to walk more smoothly   NEXT MD VISIT: Dr. Autumn Messing 7/23  OBJECTIVE:   DIAGNOSTIC FINDINGS:   PATIENT SURVEYS:  FOTO 43.3  COGNITION: Overall cognitive status: Within functional limits for tasks assessed     SENSATION: Not tested  EDEMA:  Appropriate for post-op state   No signs of redness/drainage/infection at incision, steri strips still in place    LOWER EXTREMITY ROM:  Active ROM Right eval Left eval  Hip flexion    Hip extension    Hip abduction    Hip adduction    Hip internal rotation    Hip external rotation    Knee flexion 72* supine heel slide    Knee extension 8* supine with heel prop   Ankle dorsiflexion    Ankle plantarflexion    Ankle inversion    Ankle eversion     (Blank rows = not tested)  LOWER EXTREMITY MMT:  MMT Right eval Left eval  Hip flexion 3 3  Hip extension    Hip abduction    Hip adduction    Hip internal rotation    Hip external rotation    Knee flexion 3- 4+  Knee extension 3 4+  Ankle dorsiflexion 5 5  Ankle  plantarflexion    Ankle inversion    Ankle eversion     (Blank rows = not tested)     TODAY'S TREATMENT:                                                                                                                              DATE:  06-28-23                                    EXERCISE LOG  Exercise Repetitions and Resistance Comments  nustep L4 x 15 mins seat 8,7   Rocker board X 3 mins calf stretching   LAQ 2# 3x10  pause at top   8in box lunge 2x10 flexion stretch   Step up     Blank cell = exercise not performed today  GAIT in clinic HHA x 125 ft. Mild antalgic gait still. Try SPC next visit STW to Rt knee with focus on HS, PROM  for extension ROM  all x  10 minutes PROM today was 4-108 degrees  LE elevation and  vasopneumatic x 15 minutes with IFC to lateral HS 80-150 hz x 15 mins       PATIENT EDUCATION:  Education details: exam findings, POC, HEP  Person educated: Patient Education method: Explanation, Demonstration, and Handouts Education comprehension: verbalized understanding and returned demonstration  HOME EXERCISE PROGRAM: Access Code: [email or web address] URL: [email or web address] Date: 06/14/2023 Prepared by: Nedra Hai  Exercises - Seated Knee Flexion AAROM  - 2 x daily - 7 x weekly - 1 sets - 10 reps - 3 hold - Seated Long Arc Quad  - 2 x daily - 7 x weekly - 1 sets - 10 reps - 3 hold - Seated Passive Knee Extension  - 2 x daily - 7 x weekly - 1 sets - 1 reps - 3-4 minutes hold  ASSESSMENT:  CLINICAL IMPRESSION: Patient arrived today doing fairly well with RT TKR and reports f/u with MD tomorrow. Rx focused again on ROM progression as well as quad activation and light strengthening. Pt still with sore/tight lateral HS RT knee. Her PROM today was 4-108 degrees.             IFC and Vaso end of session. Start step ups next session   06/28/23 PROGRESS REPORT:  Patient is making  good progress with skilled physical therapy as she has met most of her short term goals for physical therapy. She was able to demonstrate a significant improvement in her right range of motion since her initial evaluation on  06/14/23. However, she continues to require an assistive device for community mobility. Recommend that she continue with her current plan of care to address her remaining impairments to return to her prior level of function.   Candi Leash, PT, DPT   OBJECTIVE IMPAIRMENTS: Abnormal gait, decreased activity tolerance, decreased balance, decreased knowledge of use of DME, decreased mobility, difficulty walking, decreased ROM, decreased strength, increased edema, increased fascial restrictions, obesity, and pain.   ACTIVITY LIMITATIONS: sitting, standing, squatting, stairs, transfers, bed mobility, and locomotion level  PARTICIPATION LIMITATIONS: cleaning, laundry, driving, shopping, community activity, yard work, and church  PERSONAL FACTORS: Age, Behavior pattern, Education, Fitness, Past/current experiences, Social background, and Time since onset of injury/illness/exacerbation are also affecting patient's functional outcome.   REHAB POTENTIAL: Good  CLINICAL DECISION MAKING: Stable/uncomplicated  EVALUATION COMPLEXITY: Low   GOALS: Goals reviewed with patient? Yes  SHORT TERM GOALS: Target date: 07/05/2023   Will be compliant with appropriate progressive HEP  Baseline: Goal status: MET 2.  R knee AROM to be no more than 5* extension and 100* flexion  Baseline:  Goal status:MET  3.  Will be able to ambulate household distances with SPC, good gait mechanics and no increase in pain  Baseline:  Goal status: ON going  4.  Will be independent in edema management with regular icing and elevation Baseline:  Goal status: MET    LONG TERM GOALS: Target date: 07/26/2023    MMT to improve by one grade in all weak groups  Baseline:  Goal status: INITIAL  2.  R  knee flexion AROM to be at least 110* Baseline:  Goal status: INITIAL  3.  Will be able to navigate at least 2 steps without rails and LRAD without difficulty  Baseline:  Goal status: INITIAL  4.  Will tolerate kneeling tasks and will be able to come back up to standing without difficulty and no increase in pain R knee to allow her to return to yardwork and gardening tasks  Baseline:  Goal status: INITIAL  5.  FOTO score to be within 5 points of predicted to show improved subjective status  Baseline:  Goal status: INITIAL     PLAN:  PT FREQUENCY:  3x/week for 3 weeks, then 2x/week for the next 3 weeks   PT DURATION: 6 weeks  PLANNED INTERVENTIONS: Therapeutic exercises, Therapeutic activity, Neuromuscular re-education, Balance training, Gait training, Patient/Family education, Self Care, Joint mobilization, Stair training, DME instructions, Dry Needling, Electrical stimulation, Cryotherapy, Moist heat, Taping, Ultrasound, Ionotophoresis 4mg /ml Dexamethasone, Manual therapy, and Re-evaluation  PLAN FOR NEXT SESSION: R knee ROM, functional strength, gait training, edema control

## 2023-06-29 DIAGNOSIS — M1711 Unilateral primary osteoarthritis, right knee: Secondary | ICD-10-CM | POA: Diagnosis not present

## 2023-07-01 ENCOUNTER — Ambulatory Visit: Payer: PPO | Admitting: *Deleted

## 2023-07-01 DIAGNOSIS — R262 Difficulty in walking, not elsewhere classified: Secondary | ICD-10-CM

## 2023-07-01 DIAGNOSIS — M25561 Pain in right knee: Secondary | ICD-10-CM

## 2023-07-01 DIAGNOSIS — M25661 Stiffness of right knee, not elsewhere classified: Secondary | ICD-10-CM

## 2023-07-01 DIAGNOSIS — R600 Localized edema: Secondary | ICD-10-CM

## 2023-07-01 NOTE — Therapy (Signed)
OUTPATIENT PHYSICAL THERAPY LOWER EXTREMITY TREATMENT   Patient Name: [name] Collier MRN: [id number] DOB:07-Dec-1946, 77 y.o., female Today's Date: 07/01/2023  END OF SESSION:  PT End of Session - 07/01/23 1041     Visit Number 6    Number of Visits 16    Date for PT Re-Evaluation 07/26/23    Authorization Time Period 06/14/23 to 07/26/23    PT Start Time 1015    PT Stop Time 1115    PT Time Calculation (min) 60 min             Past Medical History:  Diagnosis Date   Anxiety    Atrial fibrillation (HCC)    Cancer (HCC) 11/2020   left breast IMC   GERD (gastroesophageal reflux disease)    HTN (hypertension)    Hyperlipidemia    Personal history of radiation therapy    TIA (transient ischemic attack)    Past Surgical History:  Procedure Laterality Date   BOTOX INJECTION N/A 12/22/2018   Procedure: BOTOX INJECTION INTO ANAL SPHINCTER;  Surgeon: Andria Meuse, MD;  Location: [facility];  Service: General;  Laterality: N/A;   BREAST BIOPSY Left 11/26/2020   BREAST LUMPECTOMY WITH RADIOACTIVE SEED AND SENTINEL LYMPH NODE BIOPSY Left 01/07/2021   Procedure: LEFT BREAST LUMPECTOMY WITH RADIOACTIVE SEED AND LEFT AXILLARY SENTINEL LYMPH NODE BIOPSY WITH BLUE DYE INJECTION;  Surgeon: Manus Rudd, MD;  Location: [facility];  Service: General;  Laterality: Left;  LMA WITH PECTORAL BLOCK, BLUE DYE INJECTION   CARDIOVERSION N/A 01/04/2018   Procedure: CARDIOVERSION;  Surgeon: Lars Masson, MD;  Location: [facility];  Service: Cardiovascular;  Laterality: N/A;   CARDIOVERSION N/A 09/20/2018   Procedure: CARDIOVERSION;  Surgeon: Quintella Reichert, MD;  Location: [facility]COPY;  Service: Cardiovascular;  Laterality: N/A;   CARDIOVERSION N/A 10/27/2018   Procedure: CARDIOVERSION;  Surgeon: Jodelle Red, MD;  Location: [facility] ENDOSCOPY;  Service: Cardiovascular;  Laterality: N/A;   KNEE SURGERY Bilateral    Torn miniscus   RECTAL EXAM UNDER ANESTHESIA N/A  12/22/2018   Procedure: ANORECTAL  EXAM UNDER ANESTHESIA;  Surgeon: Andria Meuse, MD;  Location: [facility];  Service: General;  Laterality: N/A;   RECTOCELE REPAIR     TONSILLECTOMY     VAGINAL HYSTERECTOMY     Patient Active Problem List   Diagnosis Date Noted   Hypercoagulable state due to persistent atrial fibrillation (HCC) 04/13/2023   Adenomatous polyp of colon 04/30/2022   Esophageal reflux 04/30/2022   Family history of malignant neoplasm of digestive organs 04/30/2022   History of cellulitis 04/28/2022   Encounter for monitoring dofetilide therapy 07/04/2021   Spleen injury 06/29/2021   Carcinoma of upper-inner quadrant of left breast in female, estrogen receptor positive (HCC) 12/20/2020   Trigger thumb of left hand 06/28/2019   Depression, major, single episode, mild (HCC) 03/14/2019   Generalized anxiety disorder 03/14/2019   PAF (paroxysmal atrial fibrillation) (HCC)    Osteoarthritis of knee 04/20/2018   Post-menopausal 02/22/2018   Snoring 01/12/2018   Hypokalemia 01/12/2018   Encounter for cardioversion procedure    Persistent atrial fibrillation 11/11/2017   SOB (shortness of breath) 11/11/2017   Other fatigue 11/11/2017   Hyperlipemia 04/16/2017   Weakness of left hand 05/11/2016   Anxiety related tremor 05/11/2016   Essential hypertension    Normochromic normocytic anemia    TIA (transient ischemic attack) 02/26/2016    PCP: Delynn Flavin DO   REFERRING PROVIDER: Doristine Counter, MD  REFERRING  DIAG: M17.11 (ICD-10-CM) - Unilateral primary osteoarthritis, right knee  THERAPY DIAG:  Acute pain of right knee  Stiffness of right knee, not elsewhere classified  Difficulty in walking, not elsewhere classified  Localized edema  Rationale for Evaluation and Treatment: Rehabilitation  ONSET DATE: June 21st 2024  SUBJECTIVE:   SUBJECTIVE STATEMENT: Patient reports doing okay with RT knee. MD was pleased with progress  PERTINENT  HISTORY: Anxiety, Afib, CA, HTN, HLD, TIA, breast lumpectomy with radioactive seed and sentinel lymph node biopsy, knee surgery, cardioversion PAIN:  Are you having pain? Yes: NPRS scale: 2-3/10 Pain location: Right distal/lateral hamstring. Pain description: discomfort Aggravating factors: pressure on the area Relieving factors: standing up  PRECAUTIONS: None  WEIGHT BEARING RESTRICTIONS: No  FALLS:  Has patient fallen in last 6 months? No  LIVING ENVIRONMENT: Lives with: lives alone Lives in: House/apartment Stairs: 2 STE to get to porch, then one more step to enter  Has following equipment at home: Single point cane and Environmental consultant - 2 wheeled  OCCUPATION: retired   PLOF: Independent, Independent with basic ADLs, Independent with gait, and Independent with transfers  PATIENT GOALS: get back to normal thing, be able to move better in general, be able to walk more smoothly   NEXT MD VISIT: Dr. Autumn Messing 7/23  OBJECTIVE:   DIAGNOSTIC FINDINGS:   PATIENT SURVEYS:  FOTO 43.3  COGNITION: Overall cognitive status: Within functional limits for tasks assessed     SENSATION: Not tested  EDEMA:  Appropriate for post-op state   No signs of redness/drainage/infection at incision, steri strips still in place    LOWER EXTREMITY ROM:  Active ROM Right eval Left eval  Hip flexion    Hip extension    Hip abduction    Hip adduction    Hip internal rotation    Hip external rotation    Knee flexion 72* supine heel slide    Knee extension 8* supine with heel prop   Ankle dorsiflexion    Ankle plantarflexion    Ankle inversion    Ankle eversion     (Blank rows = not tested)  LOWER EXTREMITY MMT:  MMT Right eval Left eval  Hip flexion 3 3  Hip extension    Hip abduction    Hip adduction    Hip internal rotation    Hip external rotation    Knee flexion 3- 4+  Knee extension 3 4+  Ankle dorsiflexion 5 5  Ankle plantarflexion    Ankle inversion    Ankle eversion      (Blank rows = not tested)     TODAY'S TREATMENT:                                                                                                                              DATE:  06-28-23                                    EXERCISE LOG  Exercise Repetitions and Resistance Comments  nustep L4 x 15 mins seat 8,7   Rocker board    LAQ 2# 3x10  pause at top   8in box lunge 2x10 flexion stretch   Step up 4in 2x10   Heel ups/toe ups 2x10 each    Blank cell = exercise not performed today  GAIT in clinic with SPC  x 225 ft. Pt independent STW to Rt knee with focus on HS, PROM  for extension ROM  all x  PROM today was 4-110 degrees  LE elevation and  vasopneumatic x 15 minutes       PATIENT EDUCATION:  Education details: exam findings, POC, HEP  Person educated: Patient Education method: Explanation, Demonstration, and Handouts Education comprehension: verbalized understanding and returned demonstration  HOME EXERCISE PROGRAM: Access Code: [email or web address] URL: [email or web address] Date: 06/14/2023 Prepared by: Nedra Hai  Exercises - Seated Knee Flexion AAROM  - 2 x daily - 7 x weekly - 1 sets - 10 reps - 3 hold - Seated Long Arc Quad  - 2 x daily - 7 x weekly - 1 sets - 10 reps - 3 hold - Seated Passive Knee Extension  - 2 x daily - 7 x weekly - 1 sets - 1 reps - 3-4 minutes hold  ASSESSMENT:  CLINICAL IMPRESSION: Patient arrived today doing fairly well with RT TKR and reports having a good  f/u with MD. Rx again  focused on ROM progression as well as quad activation and light strengthening. Pt ambulated in clinic with [facility] and did great.  Her PROM today was 4-110 degrees.              Vaso end of session.    06/28/23 PROGRESS REPORT:  Patient is making good progress with skilled physical therapy as she has met most of her short term goals for physical therapy. She was able to demonstrate a  significant improvement in her right range of motion since her initial evaluation on  06/14/23. However, she continues to require an assistive device for community mobility. Recommend that she continue with her current plan of care to address her remaining impairments to return to her prior level of function.   Candi Leash, PT, DPT   OBJECTIVE IMPAIRMENTS: Abnormal gait, decreased activity tolerance, decreased balance, decreased knowledge of use of DME, decreased mobility, difficulty walking, decreased ROM, decreased strength, increased edema, increased fascial restrictions, obesity, and pain.   ACTIVITY LIMITATIONS: sitting, standing, squatting, stairs, transfers, bed mobility, and locomotion level  PARTICIPATION LIMITATIONS: cleaning, laundry, driving, shopping, community activity, yard work, and church  PERSONAL FACTORS: Age, Behavior pattern, Education, Fitness, Past/current experiences, Social background, and Time since onset of injury/illness/exacerbation are also affecting patient's functional outcome.   REHAB POTENTIAL: Good  CLINICAL DECISION MAKING: Stable/uncomplicated  EVALUATION COMPLEXITY: Low   GOALS: Goals reviewed with patient? Yes  SHORT TERM GOALS: Target date: 07/05/2023   Will be compliant with appropriate progressive HEP  Baseline: Goal status: MET 2.  R knee AROM to be no more than 5* extension and 100* flexion  Baseline:  Goal status:MET  3.  Will be able to ambulate household distances with SPC, good gait mechanics and no increase in pain  Baseline:  Goal status: ON going  4.  Will  be independent in edema management with regular icing and elevation Baseline:  Goal status: MET    LONG TERM GOALS: Target date: 07/26/2023    MMT to improve by one grade in all weak groups  Baseline:  Goal status: INITIAL  2.  R knee flexion AROM to be at least 110* Baseline:  Goal status: INITIAL  3.  Will be able to navigate at least 2 steps without rails and  LRAD without difficulty  Baseline:  Goal status: INITIAL  4.  Will tolerate kneeling tasks and will be able to come back up to standing without difficulty and no increase in pain R knee to allow her to return to yardwork and gardening tasks  Baseline:  Goal status: INITIAL  5.  FOTO score to be within 5 points of predicted to show improved subjective status  Baseline:  Goal status: INITIAL     PLAN:  PT FREQUENCY:  3x/week for 3 weeks, then 2x/week for the next 3 weeks   PT DURATION: 6 weeks  PLANNED INTERVENTIONS: Therapeutic exercises, Therapeutic activity, Neuromuscular re-education, Balance training, Gait training, Patient/Family education, Self Care, Joint mobilization, Stair training, DME instructions, Dry Needling, Electrical stimulation, Cryotherapy, Moist heat, Taping, Ultrasound, Ionotophoresis 4mg /ml Dexamethasone, Manual therapy, and Re-evaluation  PLAN FOR NEXT SESSION: R knee ROM, functional strength, gait training, edema control IFC if needed

## 2023-07-05 ENCOUNTER — Encounter: Payer: Self-pay | Admitting: Family Medicine

## 2023-07-05 ENCOUNTER — Ambulatory Visit: Payer: PPO | Admitting: Physical Therapy

## 2023-07-05 ENCOUNTER — Ambulatory Visit (HOSPITAL_COMMUNITY): Payer: PPO | Admitting: Internal Medicine

## 2023-07-05 ENCOUNTER — Telehealth: Payer: Self-pay | Admitting: Hematology and Oncology

## 2023-07-05 DIAGNOSIS — M25561 Pain in right knee: Secondary | ICD-10-CM

## 2023-07-05 DIAGNOSIS — R600 Localized edema: Secondary | ICD-10-CM

## 2023-07-05 DIAGNOSIS — M25661 Stiffness of right knee, not elsewhere classified: Secondary | ICD-10-CM

## 2023-07-05 DIAGNOSIS — R262 Difficulty in walking, not elsewhere classified: Secondary | ICD-10-CM

## 2023-07-05 NOTE — Therapy (Signed)
OUTPATIENT PHYSICAL THERAPY LOWER EXTREMITY TREATMENT   Patient Name: [name] Collier MRN: [id number] DOB:1945-12-22, 77 y.o., female Today's Date: 07/05/2023  END OF SESSION:  PT End of Session - 07/05/23 1124     Visit Number 7    Number of Visits 16    Date for PT Re-Evaluation 07/26/23    Authorization Type HTA MCR    Authorization Time Period 06/14/23 to 07/26/23    Progress Note Due on Visit 10    PT Start Time 1103    PT Stop Time 1155    PT Time Calculation (min) 52 min    Activity Tolerance Patient tolerated treatment well    Behavior During Therapy [facility] for tasks assessed/performed             Past Medical History:  Diagnosis Date   Anxiety    Atrial fibrillation (HCC)    Cancer (HCC) 11/2020   left breast IMC   GERD (gastroesophageal reflux disease)    HTN (hypertension)    Hyperlipidemia    Personal history of radiation therapy    TIA (transient ischemic attack)    Past Surgical History:  Procedure Laterality Date   BOTOX INJECTION N/A 12/22/2018   Procedure: BOTOX INJECTION INTO ANAL SPHINCTER;  Surgeon: Andria Meuse, MD;  Location: [facility];  Service: General;  Laterality: N/A;   BREAST BIOPSY Left 11/26/2020   BREAST LUMPECTOMY WITH RADIOACTIVE SEED AND SENTINEL LYMPH NODE BIOPSY Left 01/07/2021   Procedure: LEFT BREAST LUMPECTOMY WITH RADIOACTIVE SEED AND LEFT AXILLARY SENTINEL LYMPH NODE BIOPSY WITH BLUE DYE INJECTION;  Surgeon: Manus Rudd, MD;  Location:  S[facility];  Service: General;  Laterality: Left;  LMA WITH PECTORAL BLOCK, BLUE DYE INJECTION   CARDIOVERSION N/A 01/04/2018   Procedure: CARDIOVERSION;  Surgeon: Lars Masson, MD;  [facility];  Service: Cardiovascular;  Laterality: N/A;   CARDIOVERSION N/A 09/20/2018   Procedure: CARDIOVERSION;  Surgeon: Quintella Reichert, MD;  Location: [facility];  Service: Cardiovascular;  Laterality: N/A;   CARDIOVERSION N/A 10/27/2018   Procedure: CARDIOVERSION;  Surgeon:  Jodelle Red, MD;  Location: [facility] ENDOSCOPY;  Service: Cardiovascular;  Laterality: N/A;   KNEE SURGERY Bilateral    Torn miniscus   RECTAL EXAM UNDER ANESTHESIA N/A 12/22/2018   Procedure: ANORECTAL  EXAM UNDER ANESTHESIA;  Surgeon: Andria Meuse, MD;  Location: [facility];  Service: General;  Laterality: N/A;   RECTOCELE REPAIR     TONSILLECTOMY     VAGINAL HYSTERECTOMY     Patient Active Problem List   Diagnosis Date Noted   Hypercoagulable state due to persistent atrial fibrillation (HCC) 04/13/2023   Adenomatous polyp of colon 04/30/2022   Esophageal reflux 04/30/2022   Family history of malignant neoplasm of digestive organs 04/30/2022   History of cellulitis 04/28/2022   Encounter for monitoring dofetilide therapy 07/04/2021   Spleen injury 06/29/2021   Carcinoma of upper-inner quadrant of left breast in female, estrogen receptor positive (HCC) 12/20/2020   Trigger thumb of left hand 06/28/2019   Depression, major, single episode, mild (HCC) 03/14/2019   Generalized anxiety disorder 03/14/2019   PAF (paroxysmal atrial fibrillation) (HCC)    Osteoarthritis of knee 04/20/2018   Post-menopausal 02/22/2018   Snoring 01/12/2018   Hypokalemia 01/12/2018   Encounter for cardioversion procedure    Persistent atrial fibrillation 11/11/2017   SOB (shortness of breath) 11/11/2017   Other fatigue 11/11/2017   Hyperlipemia 04/16/2017   Weakness of left hand 05/11/2016   Anxiety related tremor  05/11/2016   Essential hypertension    Normochromic normocytic anemia    TIA (transient ischemic attack) 02/26/2016    PCP: Delynn Flavin DO   REFERRING PROVIDER: Doristine Counter, MD  REFERRING DIAG: M17.11 (ICD-10-CM) - Unilateral primary osteoarthritis, right knee  THERAPY DIAG:  Acute pain of right knee  Stiffness of right knee, not elsewhere classified  Difficulty in walking, not elsewhere classified  Localized edema  Rationale for Evaluation and Treatment:  Rehabilitation  ONSET DATE: June 21st 2024  SUBJECTIVE:   SUBJECTIVE STATEMENT: No new complaints.  PERTINENT HISTORY: Anxiety, Afib, CA, HTN, HLD, TIA, breast lumpectomy with radioactive seed and sentinel lymph node biopsy, knee surgery, cardioversion PAIN:  Are you having pain? Yes: NPRS scale: 2-3/10 Pain location: Right distal/lateral hamstring. Pain description: discomfort Aggravating factors: pressure on the area Relieving factors: standing up  PRECAUTIONS: None  WEIGHT BEARING RESTRICTIONS: No  FALLS:  Has patient fallen in last 6 months? No  LIVING ENVIRONMENT: Lives with: lives alone Lives in: House/apartment Stairs: 2 STE to get to porch, then one more step to enter  Has following equipment at home: Single point cane and Environmental consultant - 2 wheeled  OCCUPATION: retired   PLOF: Independent, Independent with basic ADLs, Independent with gait, and Independent with transfers  PATIENT GOALS: get back to normal thing, be able to move better in general, be able to walk more smoothly   NEXT MD VISIT: Dr. Autumn Messing 7/23  OBJECTIVE:   DIAGNOSTIC FINDINGS:   PATIENT SURVEYS:  FOTO 43.3  COGNITION: Overall cognitive status: Within functional limits for tasks assessed     SENSATION: Not tested  EDEMA:  Appropriate for post-op state   No signs of redness/drainage/infection at incision, steri strips still in place    LOWER EXTREMITY ROM:  Active ROM Right eval Left eval  Hip flexion    Hip extension    Hip abduction    Hip adduction    Hip internal rotation    Hip external rotation    Knee flexion 72* supine heel slide    Knee extension 8* supine with heel prop   Ankle dorsiflexion    Ankle plantarflexion    Ankle inversion    Ankle eversion     (Blank rows = not tested)  LOWER EXTREMITY MMT:  MMT Right eval Left eval  Hip flexion 3 3  Hip extension    Hip abduction    Hip adduction    Hip internal rotation    Hip external rotation    Knee flexion  3- 4+  Knee extension 3 4+  Ankle dorsiflexion 5 5  Ankle plantarflexion    Ankle inversion    Ankle eversion     (Blank rows = not tested)     TODAY'S TREATMENT:                                                                                                                              DATE:  07-05-23                                    EXERCISE LOG  Exercise Repetitions and Resistance Comments  nustep L3 x 17 mins moving seat forward x 2 to increase flexion   Rocker board 4 minutes.   LAQ's 3# x 3 minutes               Sustained right hamstring stretch x 4 minutes f/b LE elevation and vasopneumatic to patient's right knee x 15 minutes.       PATIENT EDUCATION:  Education details: Supine HSS Person educated: Patient Education method: Explanation, Demonstration, and Handouts Education comprehension: verbalized understanding and returned demonstration  HOME EXERCISE PROGRAM: HOME EXERCISE PROGRAM Created by [place] Wynter Isaacs Jul 29th, 2024 View at [email or web address] using code: [email or web address]  Page 1 of 1 1 Exercise HAMSTRING STRETCH - SUPINE While lying on the ground, hold the back of your knee/thigh area and straighten your knee until a stretch is felt along the back of your leg. Repeat 3 Times Hold 1 Minute Complete 1 Set Perform 3 Times a Da  ASSESSMENT:  CLINICAL IMPRESSION: Patient doing well and walking with a straight cane.  She exhibited full right knee extension today.  Instructed patient in supine HSS.  She has continued palpable pain over her distal/lateral right hamstring region.     OBJECTIVE IMPAIRMENTS: Abnormal gait, decreased activity tolerance, decreased balance, decreased knowledge of use of DME, decreased mobility, difficulty walking, decreased ROM, decreased strength, increased edema, increased fascial restrictions, obesity, and pain.   ACTIVITY LIMITATIONS: sitting, standing,  squatting, stairs, transfers, bed mobility, and locomotion level  PARTICIPATION LIMITATIONS: cleaning, laundry, driving, shopping, community activity, yard work, and church  PERSONAL FACTORS: Age, Behavior pattern, Education, Fitness, Past/current experiences, Social background, and Time since onset of injury/illness/exacerbation are also affecting patient's functional outcome.   REHAB POTENTIAL: Good  CLINICAL DECISION MAKING: Stable/uncomplicated  EVALUATION COMPLEXITY: Low   GOALS: Goals reviewed with patient? Yes  SHORT TERM GOALS: Target date: 07/05/2023   Will be compliant with appropriate progressive HEP  Baseline: Goal status: MET 2.  R knee AROM to be no more than 5* extension and 100* flexion  Baseline:  Goal status:MET  3.  Will be able to ambulate household distances with SPC, good gait mechanics and no increase in pain  Baseline:  Goal status: ON going  4.  Will be independent in edema management with regular icing and elevation Baseline:  Goal status: MET    LONG TERM GOALS: Target date: 07/26/2023    MMT to improve by one grade in all weak groups  Baseline:  Goal status: INITIAL  2.  R knee flexion AROM to be at least 110* Baseline:  Goal status: INITIAL  3.  Will be able to navigate at least 2 steps without rails and LRAD without difficulty  Baseline:  Goal status: INITIAL  4.  Will tolerate kneeling tasks and will be able to come back up to standing without difficulty and no increase in pain R knee to allow her to return to yardwork and gardening tasks  Baseline:  Goal status: INITIAL  5.  FOTO score to be within 5 points of predicted to show improved subjective status  Baseline:  Goal status: INITIAL     PLAN:  PT FREQUENCY:  3x/week for 3 weeks, then 2x/week for the next 3 weeks   PT DURATION: 6  weeks  PLANNED INTERVENTIONS: Therapeutic exercises, Therapeutic activity, Neuromuscular re-education, Balance training, Gait training,  Patient/Family education, Self Care, Joint mobilization, Stair training, DME instructions, Dry Needling, Electrical stimulation, Cryotherapy, Moist heat, Taping, Ultrasound, Ionotophoresis 4mg /ml Dexamethasone, Manual therapy, and Re-evaluation  PLAN FOR NEXT SESSION: R knee ROM, functional strength, gait training, edema control IFC if needed    [place] Delmos Velaquez MPT

## 2023-07-05 NOTE — Telephone Encounter (Signed)
Absolutely.  I assume that the other pt didn't want the slot correct?

## 2023-07-06 ENCOUNTER — Encounter: Payer: Self-pay | Admitting: Family Medicine

## 2023-07-06 ENCOUNTER — Ambulatory Visit (INDEPENDENT_AMBULATORY_CARE_PROVIDER_SITE_OTHER): Payer: PPO | Admitting: Family Medicine

## 2023-07-06 VITALS — BP 134/71 | Ht 64.0 in | Wt 242.4 lb

## 2023-07-06 DIAGNOSIS — I10 Essential (primary) hypertension: Secondary | ICD-10-CM | POA: Diagnosis not present

## 2023-07-06 DIAGNOSIS — R42 Dizziness and giddiness: Secondary | ICD-10-CM

## 2023-07-06 MED ORDER — AMLODIPINE BESYLATE 5 MG PO TABS: 2.5000 mg | ORAL_TABLET | Freq: Every day | ORAL | Status: DC

## 2023-07-06 NOTE — Progress Notes (Addendum)
Subjective: CC: Drop in blood pressure, dizziness PCP: Raliegh Ip, DO QMV:HQION [name] Collier is a 77 y.o. female presenting to clinic today for:  1.  Hypertension Patient reports that she has been having blood pressures as low as 101/70s.  She notes that typically home blood pressures are running no more than 110 on top.  She reports feeling dizzy, particular with positional changes.  She brings her blood pressure meter here today for further assessment.  She reports compliance with Norvasc 5 mg, Tikosyn 500 mg and Benicar 5 mg.   ROS: Per HPI  Allergies  Allergen Reactions   Augmentin [Amoxicillin-Pot Clavulanate] Other (See Comments)    c-diff Has patient had a PCN reaction causing immediate rash, facial/tongue/throat swelling, SOB or lightheadedness with hypotension: No Has patient had a PCN reaction causing severe rash involving mucus membranes or skin necrosis: No Has patient had a PCN reaction that required hospitalization: No Has patient had a PCN reaction occurring within the last 10 years: Yes If all of the above answers are "NO", then may proceed with Cephalosporin use.    Codeine Palpitations and Rash   Prednisone Other (See Comments)    Jittery, red in the face   Celecoxib Other (See Comments)   Past Medical History:  Diagnosis Date   Anxiety    Atrial fibrillation (HCC)    Cancer (HCC) 11/2020   left breast IMC   GERD (gastroesophageal reflux disease)    HTN (hypertension)    Hyperlipidemia    Personal history of radiation therapy    TIA (transient ischemic attack)     Current Outpatient Medications:    acetaminophen (TYLENOL) 500 MG tablet, Take 1,000 mg by mouth every 6 (six) hours as needed for moderate pain or headache., Disp: , Rfl:    amLODipine (NORVASC) 5 MG tablet, Take 1 tablet (5 mg total) by mouth daily., Disp: 100 tablet, Rfl: 3   anastrozole (ARIMIDEX) 1 MG tablet, Take 1 tablet by mouth daily, Disp: 90 tablet, Rfl: 2   apixaban (ELIQUIS)  5 MG TABS tablet, Take 1 tablet (5 mg total) by mouth 2 (two) times daily., Disp: 180 tablet, Rfl: 2   Cholecalciferol (VITAMIN D3) 5000 units CAPS, Take 5,000 Units by mouth daily with supper., Disp: , Rfl:    dofetilide (TIKOSYN) 500 MCG capsule, TAKE 1 CAPSULE BY MOUTH TWICE A DAY, Disp: 180 capsule, Rfl: 2   ezetimibe (ZETIA) 10 MG tablet, Take 1 tablet (10 mg total) by mouth daily., Disp: 90 tablet, Rfl: 1   famotidine (PEPCID) 20 MG tablet, Take 1 tablet (20 mg total) by mouth 2 (two) times daily., Disp: 180 tablet, Rfl: 1   furosemide (LASIX) 20 MG tablet, Take 1 tablet (20 mg total) by mouth daily., Disp: 90 tablet, Rfl: 1   LORazepam (ATIVAN) 0.5 MG tablet, Take 0.5-1 tablets (0.25-0.5 mg total) by mouth daily as needed for anxiety., Disp: 20 tablet, Rfl: 2   olmesartan (BENICAR) 5 MG tablet, Take 1 tablet (5 mg total) by mouth daily., Disp: 90 tablet, Rfl: 1   potassium chloride SA (KLOR-CON M) 20 MEQ tablet, Take 1 tablet (20 mEq total) by mouth 2 (two) times daily., Disp: 180 tablet, Rfl: 1 Social History   Socioeconomic History   Marital status: Widowed    Spouse name: Not on file   Number of children: 2   Years of education: 53   Highest education level: 12th grade  Occupational History   Occupation: Retired    Comment:  [employer]  Tobacco Use   Smoking status: Never   Smokeless tobacco: Never   Tobacco comments:    Never smoke 06/30/22  Vaping Use   Vaping status: Never Used  Substance and Sexual Activity   Alcohol use: No    Alcohol/week: 0.0 standard drinks of alcohol   Drug use: No   Sexual activity: Not Currently    Birth control/protection: Post-menopausal  Other Topics Concern   Not on file  Social History Narrative   Lives alone - children in [place] and [place] -    Caffeine use: Soda/tea daily   Good church support group   Social Determinants of Health   Financial Resource Strain: Low Risk  (05/22/2023)   Overall Financial Resource Strain  (CARDIA)    Difficulty of Paying Living Expenses: Not hard at all  Food Insecurity: No Food Insecurity (05/28/2023)   Received from [facility], [facility]   Hunger Vital Sign    Worried About Running Out of Food in the Last Year: Never true    Ran Out of Food in the Last Year: Never true  Transportation Needs: No Transportation Needs (05/28/2023)   Received from [facility], [facility]   PRAPARE - Transportation    Lack of Transportation (Medical): No    Lack of Transportation (Non-Medical): No  Physical Activity: Inactive (05/22/2023)   Exercise Vital Sign    Days of Exercise per Week: 0 days    Minutes of Exercise per Session: 30 min  Stress: No Stress Concern Present (05/28/2023)   Received from [facility], [facility] - Occupational Stress Questionnaire    Feeling of Stress : Not at all  Recent Concern: Stress - Stress Concern Present (05/22/2023)   [employer] of Occupational Health - Occupational Stress Questionnaire    Feeling of Stress : To some extent  Social Connections: Moderately Integrated (05/22/2023)   Social Connection and Isolation Panel [NHANES]    Frequency of Communication with Friends and Family: More than three times a week    Frequency of Social Gatherings with Friends and Family: Once a week    Attends Religious Services: More than 4 times per year    Active Member of [employer] or Organizations: No    Attends Engineer, structural: More than 4 times per year    Marital Status: Widowed  Intimate Partner Violence: Not At Risk (05/28/2023)   Received from [facility]   HITS    Over the last 12 months how often did your partner physically hurt you?: 1    Over the last 12 months how often did your partner insult you or talk down to you?: 1    Over the last 12 months how often did your partner threaten you with physical harm?: 1    Over the last 12 months how often did your partner  scream or curse at you?: 1   Family History  Problem Relation Age of Onset   Hypertension Mother    Hyperlipidemia Mother    Neurologic Disorder Mother 67       GB   Stroke Son    Stroke Maternal Uncle    Stroke Grandchild    Prostate cancer Brother     Objective: Office vital signs reviewed. BP 134/71 (BP Location: Right Arm, Patient Position: Sitting) Comment: pt machine  Ht 5\' 4"  (1.626 m)   Wt 242 lb 6.4 oz (110 kg)   SpO2 95%  BMI 41.61 kg/m   Physical Examination:  General: Awake, alert, morbidly obese, No acute distress HEENT: sclera white, MMM Cardio: regular rate and rhythm, S1S2 heard, no murmurs appreciated Pulm: clear to auscultation bilaterally, no wheezes, rhonchi or rales; normal work of breathing on room air Neuro: No nystagmus  Orthostatic VS for the past 72 hrs (Last 3 readings):  Orthostatic BP Patient Position BP Location Orthostatic Pulse  07/06/23 1347 154/78 Standing Right Arm 78  07/06/23 1344 154/63 Supine Right Arm --  07/06/23 1335 -- Sitting Right Arm --  07/06/23 1302 132/63 Sitting Right Arm 61     Assessment/ Plan: 77 y.o. female   Essential hypertension  Dizziness  Her blood pressure meter is dependable and matched our levels today.  I am concerned about orthostatic hypotension in this patient and for this reason have recommended that she reduce her Norvasc to 2.5 mg daily.  Continue Tikosyn as prescribed and ARB as prescribed.  We will plan to follow-up in 2 weeks for blood pressure review and if blood pressure remains low we may discontinue the Norvasc totally.  I suspect a large portion of these drops of blood pressure have been because she is actively trying to lose weight in preparation for knee surgery.  Down almost 30 pounds since February  No orders of the defined types were placed in this encounter.  No orders of the defined types were placed in this encounter.    Raliegh Ip, DO [facility] [phone]

## 2023-07-06 NOTE — Patient Instructions (Signed)
Cut Amlodipine in half. Monitor blood pressure daily and sent/ drop off record in 2 weeks. Goal blood pressure less than 150/90.

## 2023-07-08 ENCOUNTER — Ambulatory Visit: Payer: PPO | Attending: Orthopedic Surgery | Admitting: *Deleted

## 2023-07-08 DIAGNOSIS — M25561 Pain in right knee: Secondary | ICD-10-CM | POA: Diagnosis not present

## 2023-07-08 DIAGNOSIS — R600 Localized edema: Secondary | ICD-10-CM | POA: Insufficient documentation

## 2023-07-08 DIAGNOSIS — R262 Difficulty in walking, not elsewhere classified: Secondary | ICD-10-CM | POA: Diagnosis not present

## 2023-07-08 DIAGNOSIS — M25661 Stiffness of right knee, not elsewhere classified: Secondary | ICD-10-CM | POA: Diagnosis not present

## 2023-07-08 NOTE — Therapy (Signed)
OUTPATIENT PHYSICAL THERAPY LOWER EXTREMITY TREATMENT   Patient Name: MAKINZEE ARBUTHNOT MRN: [id number] DOB:11/18/1946, 77 y.o., female Today's Date: 07/08/2023  END OF SESSION:  PT End of Session - 07/08/23 1039     Visit Number 8    Number of Visits 16    Date for PT Re-Evaluation 07/26/23    Authorization Type HTA MCR    Authorization Time Period 06/14/23 to 07/26/23    Progress Note Due on Visit 10    PT Start Time 1015    PT Stop Time 1105    PT Time Calculation (min) 50 min             Past Medical History:  Diagnosis Date   Anxiety    Atrial fibrillation (HCC)    Cancer (HCC) 11/2020   left breast IMC   GERD (gastroesophageal reflux disease)    HTN (hypertension)    Hyperlipidemia    Personal history of radiation therapy    TIA (transient ischemic attack)    Past Surgical History:  Procedure Laterality Date   BOTOX INJECTION N/A 12/22/2018   Procedure: BOTOX INJECTION INTO ANAL SPHINCTER;  Surgeon: Andria Meuse, MD;  Location: [facility];  Service: General;  Laterality: N/A;   BREAST BIOPSY Left 11/26/2020   BREAST LUMPECTOMY WITH RADIOACTIVE SEED AND SENTINEL LYMPH NODE BIOPSY Left 01/07/2021   Procedure: LEFT BREAST LUMPECTOMY WITH RADIOACTIVE SEED AND LEFT AXILLARY SENTINEL LYMPH NODE BIOPSY WITH BLUE DYE INJECTION;  Surgeon: Manus Rudd, MD;  Location: [facility];  Service: General;  Laterality: Left;  LMA WITH PECTORAL BLOCK, BLUE DYE INJECTION   CARDIOVERSION N/A 01/04/2018   Procedure: CARDIOVERSION;  Surgeon: Lars Masson, MD;  Location: [facility];  Service: Cardiovascular;  Laterality: N/A;   CARDIOVERSION N/A 09/20/2018   Procedure: CARDIOVERSION;  Surgeon: Quintella Reichert, MD;  Location: [facility]COPY;  Service: Cardiovascular;  Laterality: N/A;   CARDIOVERSION N/A 10/27/2018   Procedure: CARDIOVERSION;  Surgeon: Jodelle Red, MD;  Location: [facility] ENDOSCOPY;  Service: Cardiovascular;  Laterality: N/A;   KNEE SURGERY  Bilateral    Torn miniscus   RECTAL EXAM UNDER ANESTHESIA N/A 12/22/2018   Procedure: ANORECTAL  EXAM UNDER ANESTHESIA;  Surgeon: Andria Meuse, MD;  Location: [facility];  Service: General;  Laterality: N/A;   RECTOCELE REPAIR     TONSILLECTOMY     VAGINAL HYSTERECTOMY     Patient Active Problem List   Diagnosis Date Noted   Hypercoagulable state due to persistent atrial fibrillation (HCC) 04/13/2023   Adenomatous polyp of colon 04/30/2022   Esophageal reflux 04/30/2022   Family history of malignant neoplasm of digestive organs 04/30/2022   History of cellulitis 04/28/2022   Encounter for monitoring dofetilide therapy 07/04/2021   Spleen injury 06/29/2021   Carcinoma of upper-inner quadrant of left breast in female, estrogen receptor positive (HCC) 12/20/2020   Trigger thumb of left hand 06/28/2019   Depression, major, single episode, mild (HCC) 03/14/2019   Generalized anxiety disorder 03/14/2019   PAF (paroxysmal atrial fibrillation) (HCC)    Osteoarthritis of knee 04/20/2018   Post-menopausal 02/22/2018   Snoring 01/12/2018   Hypokalemia 01/12/2018   Encounter for cardioversion procedure    Persistent atrial fibrillation 11/11/2017   SOB (shortness of breath) 11/11/2017   Other fatigue 11/11/2017   Hyperlipemia 04/16/2017   Weakness of left hand 05/11/2016   Anxiety related tremor 05/11/2016   Essential hypertension    Normochromic normocytic anemia    TIA (transient ischemic attack) 02/26/2016  PCP: Delynn Flavin DO   REFERRING PROVIDER: Doristine Counter, MD  REFERRING DIAG: M17.11 (ICD-10-CM) - Unilateral primary osteoarthritis, right knee  THERAPY DIAG:  Acute pain of right knee  Stiffness of right knee, not elsewhere classified  Localized edema  Rationale for Evaluation and Treatment: Rehabilitation  ONSET DATE: June 21st 2024  SUBJECTIVE:   SUBJECTIVE STATEMENT: No new complaints. Only using SPC now   PERTINENT HISTORY: Anxiety, Afib, CA, HTN,  HLD, TIA, breast lumpectomy with radioactive seed and sentinel lymph node biopsy, knee surgery, cardioversion PAIN:  Are you having pain? Yes: NPRS scale: 2-3/10 Pain location: Right distal/lateral hamstring. Pain description: discomfort Aggravating factors: pressure on the area Relieving factors: standing up  PRECAUTIONS: None  WEIGHT BEARING RESTRICTIONS: No  FALLS:  Has patient fallen in last 6 months? No  LIVING ENVIRONMENT: Lives with: lives alone Lives in: House/apartment Stairs: 2 STE to get to porch, then one more step to enter  Has following equipment at home: Single point cane and Environmental consultant - 2 wheeled  OCCUPATION: retired   PLOF: Independent, Independent with basic ADLs, Independent with gait, and Independent with transfers  PATIENT GOALS: get back to normal thing, be able to move better in general, be able to walk more smoothly   NEXT MD VISIT: Dr. Autumn Messing 7/23  OBJECTIVE:   DIAGNOSTIC FINDINGS:   PATIENT SURVEYS:  FOTO 43.3  COGNITION: Overall cognitive status: Within functional limits for tasks assessed     SENSATION: Not tested  EDEMA:  Appropriate for post-op state   No signs of redness/drainage/infection at incision, steri strips still in place    LOWER EXTREMITY ROM:  Active ROM Right eval Left eval  Hip flexion    Hip extension    Hip abduction    Hip adduction    Hip internal rotation    Hip external rotation    Knee flexion 72* supine heel slide    Knee extension 8* supine with heel prop   Ankle dorsiflexion    Ankle plantarflexion    Ankle inversion    Ankle eversion     (Blank rows = not tested)  LOWER EXTREMITY MMT:  MMT Right eval Left eval  Hip flexion 3 3  Hip extension    Hip abduction    Hip adduction    Hip internal rotation    Hip external rotation    Knee flexion 3- 4+  Knee extension 3 4+  Ankle dorsiflexion 5 5  Ankle plantarflexion    Ankle inversion    Ankle eversion     (Blank rows = not  tested)     TODAY'S TREATMENT:                                                                                                                              DATE:  07-08-23                                    EXERCISE LOG  Exercise Repetitions and Resistance Comments  nustep L3 x 18 mins moving seat forward x 2 to increase flexion  to seat 7   Rocker board    LAQ's 4# x 3 x 10    Heel ups/ Toe ups 3x10  each   SLR  x10       Manual PROM for flexion and extension and scar mobs    PROM  0 to 110 degrees today  LE elevation and vasopneumatic to patient's right knee x 15 minutes.       PATIENT EDUCATION:  Education details: Supine HSS Person educated: Patient Education method: Explanation, Demonstration, and Handouts Education comprehension: verbalized understanding and returned demonstration  HOME EXERCISE PROGRAM: HOME EXERCISE PROGRAM Created by [place] Applegate Jul 29th, 2024 View at [email or web address] using code: [email or web address]  Page 1 of 1 1 Exercise HAMSTRING STRETCH - SUPINE While lying on the ground, hold the back of your knee/thigh area and straighten your knee until a stretch is felt along the back of your leg. Repeat 3 Times Hold 1 Minute Complete 1 Set Perform 3 Times a Da  ASSESSMENT:  CLINICAL IMPRESSION: Patient doing well and walking with SPC now with mild antalgic gait.  She was able to reach 0 -110 degrees. She has continued palpable pain over her distal/lateral right hamstring region, but less today.Vaso end of session.     OBJECTIVE IMPAIRMENTS: Abnormal gait, decreased activity tolerance, decreased balance, decreased knowledge of use of DME, decreased mobility, difficulty walking, decreased ROM, decreased strength, increased edema, increased fascial restrictions, obesity, and pain.   ACTIVITY LIMITATIONS: sitting, standing, squatting, stairs, transfers, bed mobility, and locomotion  level  PARTICIPATION LIMITATIONS: cleaning, laundry, driving, shopping, community activity, yard work, and church  PERSONAL FACTORS: Age, Behavior pattern, Education, Fitness, Past/current experiences, Social background, and Time since onset of injury/illness/exacerbation are also affecting patient's functional outcome.   REHAB POTENTIAL: Good  CLINICAL DECISION MAKING: Stable/uncomplicated  EVALUATION COMPLEXITY: Low   GOALS: Goals reviewed with patient? Yes  SHORT TERM GOALS: Target date: 07/05/2023   Will be compliant with appropriate progressive HEP  Baseline: Goal status: MET 2.  R knee AROM to be no more than 5* extension and 100* flexion  Baseline:  Goal status:MET  3.  Will be able to ambulate household distances with SPC, good gait mechanics and no increase in pain  Baseline:  Goal status: ON going  4.  Will be independent in edema management with regular icing and elevation Baseline:  Goal status: MET    LONG TERM GOALS: Target date: 07/26/2023    MMT to improve by one grade in all weak groups  Baseline:  Goal status: On going  2.  R knee flexion AROM to be at least 110* Baseline:  Goal status: Partially met  3.  Will be able to navigate at least 2 steps without rails and LRAD without difficulty  Baseline:  Goal status: On going  4.  Will tolerate kneeling tasks and will be able to come back up to standing without difficulty and no increase in pain R knee to allow her to return to yardwork and gardening tasks  Baseline:  Goal status: On going  5.  FOTO score to be within 5 points of predicted to show improved subjective status  Baseline:  Goal status: On going     PLAN:  PT FREQUENCY:  3x/week for 3 weeks, then 2x/week for the next 3 weeks   PT DURATION: 6 weeks  PLANNED INTERVENTIONS: Therapeutic exercises, Therapeutic activity, Neuromuscular re-education, Balance training, Gait training, Patient/Family education, Self Care, Joint  mobilization, Stair training, DME instructions, Dry Needling, Electrical stimulation, Cryotherapy, Moist heat, Taping, Ultrasound, Ionotophoresis 4mg /ml Dexamethasone, Manual therapy, and Re-evaluation  PLAN FOR NEXT SESSION: R knee ROM, functional strength, gait training, edema control IFC if needed    [employer], PTA

## 2023-07-13 ENCOUNTER — Ambulatory Visit: Payer: PPO

## 2023-07-13 DIAGNOSIS — M25561 Pain in right knee: Secondary | ICD-10-CM | POA: Diagnosis not present

## 2023-07-13 DIAGNOSIS — M25661 Stiffness of right knee, not elsewhere classified: Secondary | ICD-10-CM

## 2023-07-13 DIAGNOSIS — R262 Difficulty in walking, not elsewhere classified: Secondary | ICD-10-CM

## 2023-07-13 DIAGNOSIS — R600 Localized edema: Secondary | ICD-10-CM

## 2023-07-13 NOTE — Therapy (Signed)
OUTPATIENT PHYSICAL THERAPY LOWER EXTREMITY TREATMENT   Patient Name: [name] Collier MRN: [id number] DOB:16-Aug-1946, 77 y.o., female Today's Date: 07/13/2023  END OF SESSION:  PT End of Session - 07/13/23 1022     Visit Number 9    Number of Visits 16    Date for PT Re-Evaluation 07/26/23    Authorization Type HTA MCR    Authorization Time Period 06/14/23 to 07/26/23    Progress Note Due on Visit 10    PT Start Time 1015    PT Stop Time 1116    PT Time Calculation (min) 61 min             Past Medical History:  Diagnosis Date   Anxiety    Atrial fibrillation (HCC)    Cancer (HCC) 11/2020   left breast IMC   GERD (gastroesophageal reflux disease)    HTN (hypertension)    Hyperlipidemia    Personal history of radiation therapy    TIA (transient ischemic attack)    Past Surgical History:  Procedure Laterality Date   BOTOX INJECTION N/A 12/22/2018   Procedure: BOTOX INJECTION INTO ANAL SPHINCTER;  Surgeon: Andria Meuse, MD;  Location: [facility];  Service: General;  Laterality: N/A;   BREAST BIOPSY Left 11/26/2020   BREAST LUMPECTOMY WITH RADIOACTIVE SEED AND SENTINEL LYMPH NODE BIOPSY Left 01/07/2021   Procedure: LEFT BREAST LUMPECTOMY WITH RADIOACTIVE SEED AND LEFT AXILLARY SENTINEL LYMPH NODE BIOPSY WITH BLUE DYE INJECTION;  Surgeon: Manus Rudd, MD;  Location: [facility];  Service: General;  Laterality: Left;  LMA WITH PECTORAL BLOCK, BLUE DYE INJECTION   CARDIOVERSION N/A 01/04/2018   Procedure: CARDIOVERSION;  Surgeon: Lars Masson, MD;  Location: [facility];  Service: Cardiovascular;  Laterality: N/A;   CARDIOVERSION N/A 09/20/2018   Procedure: CARDIOVERSION;  Surgeon: Quintella Reichert, MD;  Location: [facility];  Service: Cardiovascular;  Laterality: N/A;   CARDIOVERSION N/A 10/27/2018   Procedure: CARDIOVERSION;  Surgeon: Jodelle Red, MD;  Location: [facility];  Service: Cardiovascular;  Laterality: N/A;   KNEE SURGERY  Bilateral    Torn miniscus   RECTAL EXAM UNDER ANESTHESIA N/A 12/22/2018   Procedure: ANORECTAL  EXAM UNDER ANESTHESIA;  Surgeon: Andria Meuse, MD;  Location: [facility];  Service: General;  Laterality: N/A;   RECTOCELE REPAIR     TONSILLECTOMY     VAGINAL HYSTERECTOMY     Patient Active Problem List   Diagnosis Date Noted   Hypercoagulable state due to persistent atrial fibrillation (HCC) 04/13/2023   Adenomatous polyp of colon 04/30/2022   Esophageal reflux 04/30/2022   Family history of malignant neoplasm of digestive organs 04/30/2022   History of cellulitis 04/28/2022   Encounter for monitoring dofetilide therapy 07/04/2021   Spleen injury 06/29/2021   Carcinoma of upper-inner quadrant of left breast in female, estrogen receptor positive (HCC) 12/20/2020   Trigger thumb of left hand 06/28/2019   Depression, major, single episode, mild (HCC) 03/14/2019   Generalized anxiety disorder 03/14/2019   PAF (paroxysmal atrial fibrillation) (HCC)    Osteoarthritis of knee 04/20/2018   Post-menopausal 02/22/2018   Snoring 01/12/2018   Hypokalemia 01/12/2018   Encounter for cardioversion procedure    Persistent atrial fibrillation 11/11/2017   SOB (shortness of breath) 11/11/2017   Other fatigue 11/11/2017   Hyperlipemia 04/16/2017   Weakness of left hand 05/11/2016   Anxiety related tremor 05/11/2016   Essential hypertension    Normochromic normocytic anemia    TIA (transient ischemic attack) 02/26/2016  PCP: Delynn Flavin DO   REFERRING PROVIDER: Doristine Counter, MD  REFERRING DIAG: M17.11 (ICD-10-CM) - Unilateral primary osteoarthritis, right knee  THERAPY DIAG:  Acute pain of right knee  Stiffness of right knee, not elsewhere classified  Localized edema  Difficulty in walking, not elsewhere classified  Rationale for Evaluation and Treatment: Rehabilitation  ONSET DATE: June 21st 2024  SUBJECTIVE:   SUBJECTIVE STATEMENT: Pt reports minimal right knee pain  today.  Pain mainly in right distal and lateral hamstring.  PERTINENT HISTORY: Anxiety, Afib, CA, HTN, HLD, TIA, breast lumpectomy with radioactive seed and sentinel lymph node biopsy, knee surgery, cardioversion PAIN:  Are you having pain? Yes: NPRS scale: 2-3/10 Pain location: Right distal/lateral hamstring. Pain description: discomfort Aggravating factors: pressure on the area Relieving factors: standing up  PRECAUTIONS: None  WEIGHT BEARING RESTRICTIONS: No  FALLS:  Has patient fallen in last 6 months? No  LIVING ENVIRONMENT: Lives with: lives alone Lives in: House/apartment Stairs: 2 STE to get to porch, then one more step to enter  Has following equipment at home: Single point cane and Environmental consultant - 2 wheeled  OCCUPATION: retired   PLOF: Independent, Independent with basic ADLs, Independent with gait, and Independent with transfers  PATIENT GOALS: get back to normal thing, be able to move better in general, be able to walk more smoothly   NEXT MD VISIT: Dr. Autumn Messing 7/23  OBJECTIVE:   DIAGNOSTIC FINDINGS:   PATIENT SURVEYS:  FOTO 43.3  COGNITION: Overall cognitive status: Within functional limits for tasks assessed     SENSATION: Not tested  EDEMA:  Appropriate for post-op state   No signs of redness/drainage/infection at incision, steri strips still in place    LOWER EXTREMITY ROM:  Active ROM Right eval Left eval  Hip flexion    Hip extension    Hip abduction    Hip adduction    Hip internal rotation    Hip external rotation    Knee flexion 72* supine heel slide    Knee extension 8* supine with heel prop   Ankle dorsiflexion    Ankle plantarflexion    Ankle inversion    Ankle eversion     (Blank rows = not tested)  LOWER EXTREMITY MMT:  MMT Right eval Left eval  Hip flexion 3 3  Hip extension    Hip abduction    Hip adduction    Hip internal rotation    Hip external rotation    Knee flexion 3- 4+  Knee extension 3 4+  Ankle  dorsiflexion 5 5  Ankle plantarflexion    Ankle inversion    Ankle eversion     (Blank rows = not tested)     TODAY'S TREATMENT:                                                                                                                              DATE:  07-08-23                                    EXERCISE LOG  Exercise Repetitions and Resistance Comments  Nustep L3 x 18 mins; seat 7-5   Recumbent Bike L1 x 5 mins; seat 5   Rocker board 3 mins   LAQ's 4# 2x15    Forward Step Ups 6" box x 20 reps    SLR  X15 reps        Modalities  Date:  Vaso: Knee, 34 degrees, low pressure, 15 mins, Pain and Edema  PATIENT EDUCATION:  Education details: Supine HSS Person educated: Patient Education method: Explanation, Demonstration, and Handouts Education comprehension: verbalized understanding and returned demonstration  HOME EXERCISE PROGRAM: HOME EXERCISE PROGRAM Created by [place] Applegate Jul 29th, 2024 View at [email or web address] using code: [email or web address]  Page 1 of 1 1 Exercise HAMSTRING STRETCH - SUPINE While lying on the ground, hold the back of your knee/thigh area and straighten your knee until a stretch is felt along the back of your leg. Repeat 3 Times Hold 1 Minute Complete 1 Set Perform 3 Times a Day  ASSESSMENT:  CLINICAL IMPRESSION: Pt arrives for today's treatment session reporting 2-3 right knee pain, concentrating in lateral distal hamstring.  Pt able to tolerate progress to seat 5 on the Nustep today with transition over to recumbent bike.  Pt requiring cues to avoid right hip hike with each rotation.  Pt introduced to forward step ups on six inch step with cues to avoid circumduction of right LE and to avoid pulling with BUEs.  Normal responses to vaso noted upon removal.  Pt denied any pain at completion of today's treatment session.     OBJECTIVE IMPAIRMENTS: Abnormal gait, decreased  activity tolerance, decreased balance, decreased knowledge of use of DME, decreased mobility, difficulty walking, decreased ROM, decreased strength, increased edema, increased fascial restrictions, obesity, and pain.   ACTIVITY LIMITATIONS: sitting, standing, squatting, stairs, transfers, bed mobility, and locomotion level  PARTICIPATION LIMITATIONS: cleaning, laundry, driving, shopping, community activity, yard work, and church  PERSONAL FACTORS: Age, Behavior pattern, Education, Fitness, Past/current experiences, Social background, and Time since onset of injury/illness/exacerbation are also affecting patient's functional outcome.   REHAB POTENTIAL: Good  CLINICAL DECISION MAKING: Stable/uncomplicated  EVALUATION COMPLEXITY: Low   GOALS: Goals reviewed with patient? Yes  SHORT TERM GOALS: Target date: 07/05/2023   Will be compliant with appropriate progressive HEP  Baseline: Goal status: MET 2.  R knee AROM to be no more than 5* extension and 100* flexion  Baseline:  Goal status:MET  3.  Will be able to ambulate household distances with SPC, good gait mechanics and no increase in pain  Baseline:  Goal status: MET  4.  Will be independent in edema management with regular icing and elevation Baseline:  Goal status: MET  LONG TERM GOALS: Target date: 07/26/2023    MMT to improve by one grade in all weak groups  Baseline:  Goal status: On going  2.  R knee flexion AROM to be at least 110* Baseline:  Goal status: Partially met  3.  Will be able to navigate at least 2 steps without rails and LRAD without difficulty  Baseline:  Goal status: On going  4.  Will tolerate kneeling tasks and will be able to come back up to standing without difficulty and no increase in pain R knee to allow her to  return to yardwork and gardening tasks  Baseline:  Goal status: On going  5.  FOTO score to be within 5 points of predicted to show improved subjective status  Baseline:  Goal  status: On going  PLAN:  PT FREQUENCY:  3x/week for 3 weeks, then 2x/week for the next 3 weeks   PT DURATION: 6 weeks  PLANNED INTERVENTIONS: Therapeutic exercises, Therapeutic activity, Neuromuscular re-education, Balance training, Gait training, Patient/Family education, Self Care, Joint mobilization, Stair training, DME instructions, Dry Needling, Electrical stimulation, Cryotherapy, Moist heat, Taping, Ultrasound, Ionotophoresis 4mg /ml Dexamethasone, Manual therapy, and Re-evaluation  PLAN FOR NEXT SESSION: R knee ROM, functional strength, gait training, edema control IFC if needed  Rexene Agent, PTA

## 2023-07-15 ENCOUNTER — Encounter: Payer: Self-pay | Admitting: Physical Therapy

## 2023-07-15 ENCOUNTER — Ambulatory Visit: Payer: PPO | Admitting: Physical Therapy

## 2023-07-15 DIAGNOSIS — M25661 Stiffness of right knee, not elsewhere classified: Secondary | ICD-10-CM

## 2023-07-15 DIAGNOSIS — M25561 Pain in right knee: Secondary | ICD-10-CM | POA: Diagnosis not present

## 2023-07-15 DIAGNOSIS — R262 Difficulty in walking, not elsewhere classified: Secondary | ICD-10-CM

## 2023-07-15 DIAGNOSIS — R600 Localized edema: Secondary | ICD-10-CM

## 2023-07-15 NOTE — Therapy (Addendum)
OUTPATIENT PHYSICAL THERAPY LOWER EXTREMITY TREATMENT   Patient Name: [name] Collier MRN: [id number] DOB:01-22-1946, 77 y.o., female Today's Date: 07/15/2023  END OF SESSION:  PT End of Session - 07/15/23 0937     Visit Number 10    Number of Visits 16    Date for PT Re-Evaluation 07/26/23    Authorization Type HTA MCR    Authorization Time Period 06/14/23 to 07/26/23    Progress Note Due on Visit 10    PT Start Time 0934    PT Stop Time 1014    PT Time Calculation (min) 40 min    Activity Tolerance Patient tolerated treatment well    Behavior During Therapy [facility] for tasks assessed/performed             Past Medical History:  Diagnosis Date   Anxiety    Atrial fibrillation (HCC)    Cancer (HCC) 11/2020   left breast IMC   GERD (gastroesophageal reflux disease)    HTN (hypertension)    Hyperlipidemia    Personal history of radiation therapy    TIA (transient ischemic attack)    Past Surgical History:  Procedure Laterality Date   BOTOX INJECTION N/A 12/22/2018   Procedure: BOTOX INJECTION INTO ANAL SPHINCTER;  Surgeon: Andria Meuse, MD;  Location: [facility];  Service: General;  Laterality: N/A;   BREAST BIOPSY Left 11/26/2020   BREAST LUMPECTOMY WITH RADIOACTIVE SEED AND SENTINEL LYMPH NODE BIOPSY Left 01/07/2021   Procedure: LEFT BREAST LUMPECTOMY WITH RADIOACTIVE SEED AND LEFT AXILLARY SENTINEL LYMPH NODE BIOPSY WITH BLUE DYE INJECTION;  Surgeon: Manus Rudd, MD;  Location: [facility];  Service: General;  Laterality: Left;  LMA WITH PECTORAL BLOCK, BLUE DYE INJECTION   CARDIOVERSION N/A 01/04/2018   Procedure: CARDIOVERSION;  Surgeon: Lars Masson, MD;  Location: [facility];  Service: Cardiovascular;  Laterality: N/A;   CARDIOVERSION N/A 09/20/2018   Procedure: CARDIOVERSION;  Surgeon: Quintella Reichert, MD;  Location: [facility];  Service: Cardiovascular;  Laterality: N/A;   CARDIOVERSION N/A 10/27/2018   Procedure: CARDIOVERSION;  Surgeon:  Jodelle Red, MD;  Location: [facility] ENDOSCOPY;  Service: Cardiovascular;  Laterality: N/A;   KNEE SURGERY Bilateral    Torn miniscus   RECTAL EXAM UNDER ANESTHESIA N/A 12/22/2018   Procedure: ANORECTAL  EXAM UNDER ANESTHESIA;  Surgeon: Andria Meuse, MD;  Location: [facility];  Service: General;  Laterality: N/A;   RECTOCELE REPAIR     TONSILLECTOMY     VAGINAL HYSTERECTOMY     Patient Active Problem List   Diagnosis Date Noted   Hypercoagulable state due to persistent atrial fibrillation (HCC) 04/13/2023   Adenomatous polyp of colon 04/30/2022   Esophageal reflux 04/30/2022   Family history of malignant neoplasm of digestive organs 04/30/2022   History of cellulitis 04/28/2022   Encounter for monitoring dofetilide therapy 07/04/2021   Spleen injury 06/29/2021   Carcinoma of upper-inner quadrant of left breast in female, estrogen receptor positive (HCC) 12/20/2020   Trigger thumb of left hand 06/28/2019   Depression, major, single episode, mild (HCC) 03/14/2019   Generalized anxiety disorder 03/14/2019   PAF (paroxysmal atrial fibrillation) (HCC)    Osteoarthritis of knee 04/20/2018   Post-menopausal 02/22/2018   Snoring 01/12/2018   Hypokalemia 01/12/2018   Encounter for cardioversion procedure    Persistent atrial fibrillation 11/11/2017   SOB (shortness of breath) 11/11/2017   Other fatigue 11/11/2017   Hyperlipemia 04/16/2017   Weakness of left hand 05/11/2016   Anxiety related tremor  05/11/2016   Essential hypertension    Normochromic normocytic anemia    TIA (transient ischemic attack) 02/26/2016    PCP: Delynn Flavin DO   REFERRING PROVIDER: Doristine Counter, MD  REFERRING DIAG: M17.11 (ICD-10-CM) - Unilateral primary osteoarthritis, right knee  THERAPY DIAG:  Acute pain of right knee  Stiffness of right knee, not elsewhere classified  Localized edema  Difficulty in walking, not elsewhere classified  Rationale for Evaluation and Treatment:  Rehabilitation  ONSET DATE: June 21st 2024  SUBJECTIVE:   SUBJECTIVE STATEMENT: Increased stiffness which may be related to weather.  PERTINENT HISTORY: Anxiety, Afib, CA, HTN, HLD, TIA, breast lumpectomy with radioactive seed and sentinel lymph node biopsy, knee surgery, cardioversion  PAIN:  Are you having pain? Yes: NPRS scale: 2/10 Pain location: Right distal/lateral hamstring. Pain description: discomfort Aggravating factors: pressure on the area Relieving factors: standing up  PRECAUTIONS: None  WEIGHT BEARING RESTRICTIONS: No  FALLS:  Has patient fallen in last 6 months? No  LIVING ENVIRONMENT: Lives with: lives alone Lives in: House/apartment Stairs: 2 STE to get to porch, then one more step to enter  Has following equipment at home: Single point cane and Environmental consultant - 2 wheeled  OCCUPATION: retired   PLOF: Independent, Independent with basic ADLs, Independent with gait, and Independent with transfers  PATIENT GOALS: get back to normal thing, be able to move better in general, be able to walk more smoothly   NEXT MD VISIT: Dr. Autumn Messing 7/23  OBJECTIVE:   DIAGNOSTIC FINDINGS:   PATIENT SURVEYS:  FOTO 43.3  COGNITION: Overall cognitive status: Within functional limits for tasks assessed     SENSATION: Not tested  EDEMA:  Appropriate for post-op state   No signs of redness/drainage/infection at incision, steri strips still in place    LOWER EXTREMITY ROM:  Active ROM Right eval Right AROM 07/15/23  Hip flexion    Hip extension    Hip abduction    Hip adduction    Hip internal rotation    Hip external rotation    Knee flexion 72* supine heel slide  106  Knee extension 8* supine with heel prop 4  Ankle dorsiflexion    Ankle plantarflexion    Ankle inversion    Ankle eversion     (Blank rows = not tested)  LOWER EXTREMITY MMT:  MMT Right eval Left eval  Hip flexion 3 3  Hip extension    Hip abduction    Hip adduction    Hip internal  rotation    Hip external rotation    Knee flexion 3- 4+  Knee extension 3 4+  Ankle dorsiflexion 5 5  Ankle plantarflexion    Ankle inversion    Ankle eversion     (Blank rows = not tested)     TODAY'S TREATMENT:  DATE:  07-15-23 EXERCISE LOG  Exercise Repetitions and Resistance Comments  Nustep L3 x 5 mins; seat 7-5   Recumbent Bike L1 x 10 mins; seat 4   Rockerboard X3 min   Lunges 14" step x20 reps   Heel prop for extension X3 min            Modalities  Date: 07/15/23 Vaso: Knee, 34 degrees, low pressure, 10 mins, Pain and Edema  PATIENT EDUCATION:  Education details: Supine HSS Person educated: Patient Education method: Explanation, Demonstration, and Handouts Education comprehension: verbalized understanding and returned demonstration  HOME EXERCISE PROGRAM: HOME EXERCISE PROGRAM Created by [place] Applegate Jul 29th, 2024 View at [email or web address] using code: [email or web address]  Page 1 of 1 1 Exercise HAMSTRING STRETCH - SUPINE While lying on the ground, hold the back of your knee/thigh area and straighten your knee until a stretch is felt along the back of your leg. Repeat 3 Times Hold 1 Minute Complete 1 Set Perform 3 Times a Day  ASSESSMENT:  CLINICAL IMPRESSION: Patient presented in clinic with reports of increased stiffness which could be related to rainy weather. Patient reported cramp in L popliteal/calf area that lingered minimally throughout the treatment. Patient able to tolerate therex with mild limitations due to stiffness with ROM measurements. Normal vasopneumatic response noted following removal of the modality.  OBJECTIVE IMPAIRMENTS: Abnormal gait, decreased activity tolerance, decreased balance, decreased knowledge of use of DME, decreased mobility, difficulty walking, decreased ROM, decreased strength, increased edema,  increased fascial restrictions, obesity, and pain.   ACTIVITY LIMITATIONS: sitting, standing, squatting, stairs, transfers, bed mobility, and locomotion level  PARTICIPATION LIMITATIONS: cleaning, laundry, driving, shopping, community activity, yard work, and church  PERSONAL FACTORS: Age, Behavior pattern, Education, Fitness, Past/current experiences, Social background, and Time since onset of injury/illness/exacerbation are also affecting patient's functional outcome.   REHAB POTENTIAL: Good  CLINICAL DECISION MAKING: Stable/uncomplicated  EVALUATION COMPLEXITY: Low   GOALS: Goals reviewed with patient? Yes  SHORT TERM GOALS: Target date: 07/05/2023   Will be compliant with appropriate progressive HEP  Baseline: Goal status: MET  2.  R knee AROM to be no more than 5* extension and 100* flexion  Baseline:  Goal status:MET  3.  Will be able to ambulate household distances with SPC, good gait mechanics and no increase in pain  Baseline:  Goal status: MET  4.  Will be independent in edema management with regular icing and elevation Baseline:  Goal status: MET  LONG TERM GOALS: Target date: 07/26/2023   MMT to improve by one grade in all weak groups  Baseline:  Goal status: On going  2.  R knee flexion AROM to be at least 110* Baseline:  Goal status: Partially met  3.  Will be able to navigate at least 2 steps without rails and LRAD without difficulty  Baseline:  Goal status: On going  4.  Will tolerate kneeling tasks and will be able to come back up to standing without difficulty and no increase in pain R knee to allow her to return to yardwork and gardening tasks  Baseline:  Goal status: On going  5.  FOTO score to be within 5 points of predicted to show improved subjective status  Baseline:  Goal status: On going  PLAN:  PT FREQUENCY:  3x/week for 3 weeks, then 2x/week for the next 3 weeks   PT DURATION: 6 weeks  PLANNED INTERVENTIONS: Therapeutic  exercises, Therapeutic activity, Neuromuscular re-education, Balance training, Gait training, Patient/Family education, Self Care, Joint mobilization,  Stair training, DME instructions, Dry Needling, Electrical stimulation, Cryotherapy, Moist heat, Taping, Ultrasound, Ionotophoresis 4mg /ml Dexamethasone, Manual therapy, and Re-evaluation  PLAN FOR NEXT SESSION: R knee ROM, functional strength, gait training, edema control IFC if needed  Marvell Fuller, PTA 07/15/23 10:16 AM  Progress Note Reporting Period 06/14/23 to 07/15/23.  See note below for Objective Data and Assessment of Progress/Goals. Patient doing very well.  She is ambulating with a cane and is making very good progress toward goals with a significant improvement in her range of motion since beginning PT.  [place] Applegate MPT

## 2023-07-19 ENCOUNTER — Ambulatory Visit: Payer: PPO | Admitting: Physical Therapy

## 2023-07-19 DIAGNOSIS — R600 Localized edema: Secondary | ICD-10-CM

## 2023-07-19 DIAGNOSIS — M25561 Pain in right knee: Secondary | ICD-10-CM

## 2023-07-19 DIAGNOSIS — M25661 Stiffness of right knee, not elsewhere classified: Secondary | ICD-10-CM

## 2023-07-19 NOTE — Therapy (Signed)
OUTPATIENT PHYSICAL THERAPY LOWER EXTREMITY TREATMENT   Patient Name: [name] Collier MRN: [id number] DOB:1945-12-28, 77 y.o., female Today's Date: 07/19/2023  END OF SESSION:  PT End of Session - 07/19/23 1111     Visit Number 11    Number of Visits 16    PT Start Time 1017    PT Stop Time 1059    PT Time Calculation (min) 42 min    Activity Tolerance Patient tolerated treatment well    Behavior During Therapy [facility] for tasks assessed/performed             Past Medical History:  Diagnosis Date   Anxiety    Atrial fibrillation (HCC)    Cancer (HCC) 11/2020   left breast IMC   GERD (gastroesophageal reflux disease)    HTN (hypertension)    Hyperlipidemia    Personal history of radiation therapy    TIA (transient ischemic attack)    Past Surgical History:  Procedure Laterality Date   BOTOX INJECTION N/A 12/22/2018   Procedure: BOTOX INJECTION INTO ANAL SPHINCTER;  Surgeon: Andria Meuse, MD;  Location: [facility];  Service: General;  Laterality: N/A;   BREAST BIOPSY Left 11/26/2020   BREAST LUMPECTOMY WITH RADIOACTIVE SEED AND SENTINEL LYMPH NODE BIOPSY Left 01/07/2021   Procedure: LEFT BREAST LUMPECTOMY WITH RADIOACTIVE SEED AND LEFT AXILLARY SENTINEL LYMPH NODE BIOPSY WITH BLUE DYE INJECTION;  Surgeon: Manus Rudd, MD;  Location: [facility];  Service: General;  Laterality: Left;  LMA WITH PECTORAL BLOCK, BLUE DYE INJECTION   CARDIOVERSION N/A 01/04/2018   Procedure: CARDIOVERSION;  Surgeon: Lars Masson, MD;  Location: [facility] ENDOSCOPY;  Service: Cardiovascular;  Laterality: N/A;   CARDIOVERSION N/A 09/20/2018   Procedure: CARDIOVERSION;  Surgeon: Quintella Reichert, MD;  Location: [facility] ENDOSCOPY;  Service: Cardiovascular;  Laterality: N/A;   CARDIOVERSION N/A 10/27/2018   Procedure: CARDIOVERSION;  Surgeon: Jodelle Red, MD;  Location: [facility] ENDOSCOPY;  Service: Cardiovascular;  Laterality: N/A;   KNEE SURGERY Bilateral    Torn miniscus   RECTAL  EXAM UNDER ANESTHESIA N/A 12/22/2018   Procedure: ANORECTAL  EXAM UNDER ANESTHESIA;  Surgeon: Andria Meuse, MD;  Location: [facility];  Service: General;  Laterality: N/A;   RECTOCELE REPAIR     TONSILLECTOMY     VAGINAL HYSTERECTOMY     Patient Active Problem List   Diagnosis Date Noted   Hypercoagulable state due to persistent atrial fibrillation (HCC) 04/13/2023   Adenomatous polyp of colon 04/30/2022   Esophageal reflux 04/30/2022   Family history of malignant neoplasm of digestive organs 04/30/2022   History of cellulitis 04/28/2022   Encounter for monitoring dofetilide therapy 07/04/2021   Spleen injury 06/29/2021   Carcinoma of upper-inner quadrant of left breast in female, estrogen receptor positive (HCC) 12/20/2020   Trigger thumb of left hand 06/28/2019   Depression, major, single episode, mild (HCC) 03/14/2019   Generalized anxiety disorder 03/14/2019   PAF (paroxysmal atrial fibrillation) (HCC)    Osteoarthritis of knee 04/20/2018   Post-menopausal 02/22/2018   Snoring 01/12/2018   Hypokalemia 01/12/2018   Encounter for cardioversion procedure    Persistent atrial fibrillation 11/11/2017   SOB (shortness of breath) 11/11/2017   Other fatigue 11/11/2017   Hyperlipemia 04/16/2017   Weakness of left hand 05/11/2016   Anxiety related tremor 05/11/2016   Essential hypertension    Normochromic normocytic anemia    TIA (transient ischemic attack) 02/26/2016    PCP: Delynn Flavin DO   REFERRING PROVIDER: Doristine Counter, MD  REFERRING DIAG: M17.11 (ICD-10-CM) - Unilateral primary osteoarthritis, right knee  THERAPY DIAG:  Acute pain of right knee  Stiffness of right knee, not elsewhere classified  Localized edema  Rationale for Evaluation and Treatment: Rehabilitation  ONSET DATE: June 21st 2024  SUBJECTIVE:   SUBJECTIVE STATEMENT: Left knee flared up since last treatment.  Took it easy over the weekend and iced.  Doing much better today.  PERTINENT  HISTORY: Anxiety, Afib, CA, HTN, HLD, TIA, breast lumpectomy with radioactive seed and sentinel lymph node biopsy, knee surgery, cardioversion  PAIN:  Are you having pain? Yes: NPRS scale: 2/10 Pain location: Right distal/lateral hamstring. Pain description: discomfort Aggravating factors: pressure on the area Relieving factors: standing up  PRECAUTIONS: None  WEIGHT BEARING RESTRICTIONS: No  FALLS:  Has patient fallen in last 6 months? No  LIVING ENVIRONMENT: Lives with: lives alone Lives in: House/apartment Stairs: 2 STE to get to porch, then one more step to enter  Has following equipment at home: Single point cane and Environmental consultant - 2 wheeled  OCCUPATION: retired   PLOF: Independent, Independent with basic ADLs, Independent with gait, and Independent with transfers  PATIENT GOALS: get back to normal thing, be able to move better in general, be able to walk more smoothly   NEXT MD VISIT: Dr. Autumn Messing 7/23  OBJECTIVE:   DIAGNOSTIC FINDINGS:   PATIENT SURVEYS:  FOTO 43.3  COGNITION: Overall cognitive status: Within functional limits for tasks assessed     SENSATION: Not tested  EDEMA:  Appropriate for post-op state   No signs of redness/drainage/infection at incision, steri strips still in place    LOWER EXTREMITY ROM:  Active ROM Right eval Right AROM 07/15/23  Hip flexion    Hip extension    Hip abduction    Hip adduction    Hip internal rotation    Hip external rotation    Knee flexion 72* supine heel slide  106  Knee extension 8* supine with heel prop 4  Ankle dorsiflexion    Ankle plantarflexion    Ankle inversion    Ankle eversion     (Blank rows = not tested)  LOWER EXTREMITY MMT:  MMT Right eval Left eval  Hip flexion 3 3  Hip extension    Hip abduction    Hip adduction    Hip internal rotation    Hip external rotation    Knee flexion 3- 4+  Knee extension 3 4+  Ankle dorsiflexion 5 5  Ankle plantarflexion    Ankle inversion    Ankle  eversion     (Blank rows = not tested)     TODAY'S TREATMENT:                                                                                                                              DATE:  07-15-23 EXERCISE LOG  Exercise Repetitions and Resistance Comments  Nustep L3 x 18 minutes.  In supine:  PROM x 5 minutes to patient's right knee. Modalities Date: 07/19/23 Vaso: Knee, 34 degrees, low pressure, 10 mins, Pain and Edema  PATIENT EDUCATION:  Education details: Supine HSS Person educated: Patient Education method: Explanation, Demonstration, and Handouts Education comprehension: verbalized understanding and returned demonstration  HOME EXERCISE PROGRAM: HOME EXERCISE PROGRAM Created by [place]  Jul 29th, 2024 View at [email or web address] using code: [email or web address]  Page 1 of 1 1 Exercise HAMSTRING STRETCH - SUPINE While lying on the ground, hold the back of your knee/thigh area and straighten your knee until a stretch is felt along the back of your leg. Repeat 3 Times Hold 1 Minute Complete 1 Set Perform 3 Times a Day  ASSESSMENT:  CLINICAL IMPRESSION: Patient did well today.  No recumbent bike today.  She achieved passive right knee flexion to 115 degrees today.  OBJECTIVE IMPAIRMENTS: Abnormal gait, decreased activity tolerance, decreased balance, decreased knowledge of use of DME, decreased mobility, difficulty walking, decreased ROM, decreased strength, increased edema, increased fascial restrictions, obesity, and pain.   ACTIVITY LIMITATIONS: sitting, standing, squatting, stairs, transfers, bed mobility, and locomotion level  PARTICIPATION LIMITATIONS: cleaning, laundry, driving, shopping, community activity, yard work, and church  PERSONAL FACTORS: Age, Behavior pattern, Education, Fitness, Past/current experiences, Social background, and Time since onset of injury/illness/exacerbation are also affecting patient's functional  outcome.   REHAB POTENTIAL: Good  CLINICAL DECISION MAKING: Stable/uncomplicated  EVALUATION COMPLEXITY: Low   GOALS: Goals reviewed with patient? Yes  SHORT TERM GOALS: Target date: 07/05/2023   Will be compliant with appropriate progressive HEP  Baseline: Goal status: MET  2.  R knee AROM to be no more than 5* extension and 100* flexion  Baseline:  Goal status:MET  3.  Will be able to ambulate household distances with SPC, good gait mechanics and no increase in pain  Baseline:  Goal status: MET  4.  Will be independent in edema management with regular icing and elevation Baseline:  Goal status: MET  LONG TERM GOALS: Target date: 07/26/2023   MMT to improve by one grade in all weak groups  Baseline:  Goal status: On going  2.  R knee flexion AROM to be at least 110* Baseline:  Goal status: Partially met  3.  Will be able to navigate at least 2 steps without rails and LRAD without difficulty  Baseline:  Goal status: On going  4.  Will tolerate kneeling tasks and will be able to come back up to standing without difficulty and no increase in pain R knee to allow her to return to yardwork and gardening tasks  Baseline:  Goal status: On going  5.  FOTO score to be within 5 points of predicted to show improved subjective status  Baseline:  Goal status: On going  PLAN:  PT FREQUENCY:  3x/week for 3 weeks, then 2x/week for the next 3 weeks   PT DURATION: 6 weeks  PLANNED INTERVENTIONS: Therapeutic exercises, Therapeutic activity, Neuromuscular re-education, Balance training, Gait training, Patient/Family education, Self Care, Joint mobilization, Stair training, DME instructions, Dry Needling, Electrical stimulation, Cryotherapy, Moist heat, Taping, Ultrasound, Ionotophoresis 4mg /ml Dexamethasone, Manual therapy, and Re-evaluation  PLAN FOR NEXT SESSION: R knee ROM, functional strength, gait training, edema control IFC if needed  [place]  MPT

## 2023-07-20 ENCOUNTER — Inpatient Hospital Stay: Payer: PPO | Attending: Hematology and Oncology | Admitting: Hematology and Oncology

## 2023-07-20 ENCOUNTER — Other Ambulatory Visit: Payer: Self-pay

## 2023-07-20 VITALS — BP 194/73 | HR 66 | Temp 98.1°F | Resp 17 | Wt 242.4 lb

## 2023-07-20 DIAGNOSIS — Z923 Personal history of irradiation: Secondary | ICD-10-CM | POA: Diagnosis not present

## 2023-07-20 DIAGNOSIS — C50212 Malignant neoplasm of upper-inner quadrant of left female breast: Secondary | ICD-10-CM | POA: Diagnosis not present

## 2023-07-20 DIAGNOSIS — M170 Bilateral primary osteoarthritis of knee: Secondary | ICD-10-CM | POA: Insufficient documentation

## 2023-07-20 DIAGNOSIS — Z79811 Long term (current) use of aromatase inhibitors: Secondary | ICD-10-CM | POA: Diagnosis not present

## 2023-07-20 DIAGNOSIS — Z170 Estrogen receptor positive status [ER+]: Secondary | ICD-10-CM | POA: Diagnosis not present

## 2023-07-20 DIAGNOSIS — Z96651 Presence of right artificial knee joint: Secondary | ICD-10-CM | POA: Diagnosis not present

## 2023-07-20 NOTE — Progress Notes (Signed)
Patient Care Team: Raliegh Ip, DO as PCP - General (Family Medicine) [name], MD as PCP - Cardiology (Cardiology) Hillis Range, MD (Inactive) as PCP - Electrophysiology (Cardiology) Serena Croissant, MD as Consulting Physician (Hematology and Oncology) Bernette Redbird, MD as Consulting Physician (Gastroenterology) Manus Rudd, MD as Consulting Physician (General Surgery) [facility] Lonie Peak, MD as Attending Physician (Radiation Oncology) Danella Maiers, [facility] as Pharmacist (Family Medicine) Randa Spike Kelton Pillar, LCSW as [facility] Care Management (Licensed Clinical Social Worker) Nita Sells, MD (Dermatology)  DIAGNOSIS:  Encounter Diagnosis  Name Primary?   Carcinoma of upper-inner quadrant of left breast in female, estrogen receptor positive (HCC) Yes    SUMMARY OF ONCOLOGIC HISTORY: Oncology History  Carcinoma of upper-inner quadrant of left breast in female, estrogen receptor positive (HCC)  12/20/2020 Initial Diagnosis   Screening mammogram detected a left breast asymmetry. Diagnostic mammogram and US showed a 0.7cm mass at the 10 o'clock position and normal left axillary lymph nodes. Biopsy showed invasive lobular carcinoma, grade 1-2, HER-2 negative (1+), ER+ 50%, PR+ 90%, Ki67 10%.   12/25/2020 Cancer Staging   Staging form: Breast, AJCC 8th Edition - Clinical: Stage IA (cT1b, cN0, cM0, G2, ER+, PR+, HER2-)   01/07/2021 Surgery   Left lumpectomy (Tsuei) ([id number]): invasive and in situ lobular carcinoma, grade 2, 1.8cm, clear margins, and no evidence of carcinoma in 3 left axillary lymph nodes.   01/20/2021 Oncotype testing   The Oncotype DX score was 10 predicting a risk of outside the breast recurrence over the next 9 years of 3% if the patient's only systemic therapy is tamoxifen for 5 years.     02/18/2021 - 03/12/2021 Radiation Therapy   The patient initially received a dose of 42.56 Gy in 16 fractions to the  breast using whole-breast tangent fields. This was delivered using a 3-D conformal technique.   02/2021 - 02/2026 Anti-estrogen oral therapy   Anastrozole     CHIEF COMPLIANT: Follow-up of left breast cancer  on anastrozole  INTERVAL HISTORY: [name] Collier is a 77 y.o. with above-mentioned history of  left breast cancer having undergone lumpectomy. She presents to the clinic today for follow-up. Patient reports her health is good. She just had right knee replacement. She is tolerating the anastrozole extremely well. She states the hot flashes has subsided. No pain or discomfort in breast.   ALLERGIES:  is allergic to augmentin [amoxicillin-pot clavulanate], codeine, prednisone, and celecoxib.  MEDICATIONS:  Current Outpatient Medications  Medication Sig Dispense Refill   acetaminophen (TYLENOL) 500 MG tablet Take 1,000 mg by mouth every 6 (six) hours as needed for moderate pain or headache.     amLODipine (NORVASC) 5 MG tablet Take 0.5 tablets (2.5 mg total) by mouth daily.     anastrozole (ARIMIDEX) 1 MG tablet Take 1 tablet by mouth daily 90 tablet 2   apixaban (ELIQUIS) 5 MG TABS tablet Take 1 tablet (5 mg total) by mouth 2 (two) times daily. 180 tablet 2   Cholecalciferol (VITAMIN D3) 50 MCG (2000 UT) capsule      Cholecalciferol (VITAMIN D3) 5000 units CAPS Take 5,000 Units by mouth daily with supper.     dofetilide (TIKOSYN) 500 MCG capsule TAKE 1 CAPSULE BY MOUTH TWICE A DAY 180 capsule 2   ezetimibe (ZETIA) 10 MG tablet Take 1 tablet (10 mg total) by mouth daily. 90 tablet 1   famotidine (PEPCID) 20 MG tablet Take 1 tablet (20 mg total) by mouth 2 (two)  times daily. 180 tablet 1   furosemide (LASIX) 20 MG tablet Take 1 tablet (20 mg total) by mouth daily. 90 tablet 1   LORazepam (ATIVAN) 0.5 MG tablet Take 0.5-1 tablets (0.25-0.5 mg total) by mouth daily as needed for anxiety. 20 tablet 2   olmesartan (BENICAR) 5 MG tablet Take 1 tablet (5 mg total) by mouth daily. 90 tablet 1    potassium chloride SA (KLOR-CON M) 20 MEQ tablet Take 1 tablet (20 mEq total) by mouth 2 (two) times daily. 180 tablet 1   No current facility-administered medications for this visit.    PHYSICAL EXAMINATION: ECOG PERFORMANCE STATUS: 1 - Symptomatic but completely ambulatory  Vitals:   07/20/23 1046  BP: (!) 194/73  Pulse: 66  Resp: 17  Temp: 98.1 F (36.7 C)  SpO2: 97%   Filed Weights   07/20/23 1046  Weight: 242 lb 6.4 oz (110 kg)    BREAST: No palpable masses or nodules in either right or left breasts. No palpable axillary supraclavicular or infraclavicular adenopathy no breast tenderness or nipple discharge. (exam performed in the presence of a chaperone)  LABORATORY DATA:  I have reviewed the data as listed    Latest Ref Rng & Units 05/12/2023   10:39 AM 01/20/2023    8:56 AM 12/29/2022    2:00 PM  CMP  Glucose 70 - 99 mg/dL 161  096  97   BUN 8 - 23 mg/dL 18  13  17    Creatinine 0.44 - 1.00 mg/dL 0.45  4.09  8.11   Sodium 135 - 145 mmol/L 139  144  138   Potassium 3.5 - 5.1 mmol/L 4.3  4.5  4.2   Chloride 98 - 111 mmol/L 107  105  103   CO2 22 - 32 mmol/L 24  24  24    Calcium 8.9 - 10.3 mg/dL 9.7  9.6  9.4   Total Protein 6.0 - 8.5 g/dL  7.0    Total Bilirubin 0.0 - 1.2 mg/dL  1.1    Alkaline Phos 44 - 121 IU/L  101    AST 0 - 40 IU/L  18    ALT 0 - 32 IU/L  16      Lab Results  Component Value Date   WBC 4.9 01/20/2023   HGB 13.5 01/20/2023   HCT 41.4 01/20/2023   MCV 90 01/20/2023   PLT 249 01/20/2023   NEUTROABS 2.4 01/20/2023    ASSESSMENT & PLAN:  Carcinoma of upper-inner quadrant of left breast in female, estrogen receptor positive (HCC) 12/20/2020: Screening mammogram detected left breast asymmetry.  Ultrasound revealed 0.7 cm mass at 10 o'clock position, axilla negative, biopsy revealed grade 1-2 invasive lobular cancer, ER 50%, PR 90%, Ki-67 10%, HER2 negative by IHC 1+ T1BN0 stage Ia clinical stage   01/07/2021: Left lumpectomy (Tsuei): invasive  and in situ lobular carcinoma, grade 2, 1.8cm, clear margins, and no evidence of carcinoma in 3 left axillary lymph nodes.   Oncotype Dx 10: ROR 3%   Plan: 1. Adj RT completed 03/12/2021 2. Foll by Adj Anti-estrogen therapy started March 2022   Anastrozole toxicities: Denies any adverse effects to anastrozole.  Very occasional hot flashes. Chronic osteoarthritis of bilateral knees: Making it difficult for her to exercise.   Breast cancer surveillance: 1.  Breast exam 07/20/2023: Benign 2. mammogram 11/17/2022: Benign breast density category C   Right knee replacement surgery June 2024.  Return to clinic in 1 year for follow-up    No  orders of the defined types were placed in this encounter.  The patient has a good understanding of the overall plan. she agrees with it. she will call with any problems that may develop before the next visit here. Total time spent: 30 mins including face to face time and time spent for planning, charting and co-ordination of care   Tamsen Meek, MD 07/20/23    I Janan Ridge am acting as a Neurosurgeon for [employer]  I have reviewed the above documentation for accuracy and completeness, and I agree with the above.

## 2023-07-20 NOTE — Assessment & Plan Note (Signed)
12/20/2020: Screening mammogram detected left breast asymmetry.  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   Oncotype Dx 10: ROR 3%   Plan: 1. Adj RT completed 03/12/2021 2. Foll by Adj Anti-estrogen therapy started March 2022   Anastrozole toxicities: Denies any adverse effects to anastrozole.  Very occasional hot flashes. Chronic osteoarthritis of bilateral knees: Making it difficult for her to exercise.   Breast cancer surveillance: 1.  Breast exam 07/20/2023: Benign 2. mammogram 11/17/2022: Benign breast density category C   Return to clinic in 1 year for follow-up

## 2023-07-21 ENCOUNTER — Ambulatory Visit: Payer: PPO | Admitting: Hematology and Oncology

## 2023-07-22 ENCOUNTER — Ambulatory Visit: Payer: PPO | Admitting: *Deleted

## 2023-07-22 DIAGNOSIS — R600 Localized edema: Secondary | ICD-10-CM

## 2023-07-22 DIAGNOSIS — M25561 Pain in right knee: Secondary | ICD-10-CM

## 2023-07-22 DIAGNOSIS — R262 Difficulty in walking, not elsewhere classified: Secondary | ICD-10-CM

## 2023-07-22 DIAGNOSIS — M25661 Stiffness of right knee, not elsewhere classified: Secondary | ICD-10-CM

## 2023-07-22 NOTE — Therapy (Signed)
OUTPATIENT PHYSICAL THERAPY LOWER EXTREMITY TREATMENT   Patient Name: [name] Collier MRN: [id number] DOB:Mar 25, 1946, 77 y.o., female Today's Date: 07/22/2023  END OF SESSION:  PT End of Session - 07/22/23 1026     Visit Number 12    Number of Visits 16    Date for PT Re-Evaluation 07/26/23    Authorization Type HTA MCR    Authorization Time Period 06/14/23 to 07/26/23    Progress Note Due on Visit 10    PT Start Time 1015    PT Stop Time 1105    PT Time Calculation (min) 50 min             Past Medical History:  Diagnosis Date   Anxiety    Atrial fibrillation (HCC)    Cancer (HCC) 11/2020   left breast IMC   GERD (gastroesophageal reflux disease)    HTN (hypertension)    Hyperlipidemia    Personal history of radiation therapy    TIA (transient ischemic attack)    Past Surgical History:  Procedure Laterality Date   BOTOX INJECTION N/A 12/22/2018   Procedure: BOTOX INJECTION INTO ANAL SPHINCTER;  Surgeon: Andria Meuse, MD;  Location: [facility];  Service: General;  Laterality: N/A;   BREAST BIOPSY Left 11/26/2020   BREAST LUMPECTOMY WITH RADIOACTIVE SEED AND SENTINEL LYMPH NODE BIOPSY Left 01/07/2021   Procedure: LEFT BREAST LUMPECTOMY WITH RADIOACTIVE SEED AND LEFT AXILLARY SENTINEL LYMPH NODE BIOPSY WITH BLUE DYE INJECTION;  Surgeon: Manus Rudd, MD;  Location: [facility];  Service: General;  Laterality: Left;  LMA WITH PECTORAL BLOCK, BLUE DYE INJECTION   CARDIOVERSION N/A 01/04/2018   Procedure: CARDIOVERSION;  Surgeon: Lars Masson, MD;  Location: [facility];  Service: Cardiovascular;  Laterality: N/A;   CARDIOVERSION N/A 09/20/2018   Procedure: CARDIOVERSION;  Surgeon: Quintella Reichert, MD;  Location: [facility] ENDOSCOPY;  Service: Cardiovascular;  Laterality: N/A;   CARDIOVERSION N/A 10/27/2018   Procedure: CARDIOVERSION;  Surgeon: Jodelle Red, MD;  Location: [facility] ENDOSCOPY;  Service: Cardiovascular;  Laterality: N/A;   KNEE SURGERY  Bilateral    Torn miniscus   RECTAL EXAM UNDER ANESTHESIA N/A 12/22/2018   Procedure: ANORECTAL  EXAM UNDER ANESTHESIA;  Surgeon: Andria Meuse, MD;  Location: [facility];  Service: General;  Laterality: N/A;   RECTOCELE REPAIR     TONSILLECTOMY     VAGINAL HYSTERECTOMY     Patient Active Problem List   Diagnosis Date Noted   Hypercoagulable state due to persistent atrial fibrillation (HCC) 04/13/2023   Adenomatous polyp of colon 04/30/2022   Esophageal reflux 04/30/2022   Family history of malignant neoplasm of digestive organs 04/30/2022   History of cellulitis 04/28/2022   Encounter for monitoring dofetilide therapy 07/04/2021   Spleen injury 06/29/2021   Carcinoma of upper-inner quadrant of left breast in female, estrogen receptor positive (HCC) 12/20/2020   Trigger thumb of left hand 06/28/2019   Depression, major, single episode, mild (HCC) 03/14/2019   Generalized anxiety disorder 03/14/2019   PAF (paroxysmal atrial fibrillation) (HCC)    Osteoarthritis of knee 04/20/2018   Post-menopausal 02/22/2018   Snoring 01/12/2018   Hypokalemia 01/12/2018   Encounter for cardioversion procedure    Persistent atrial fibrillation 11/11/2017   SOB (shortness of breath) 11/11/2017   Other fatigue 11/11/2017   Hyperlipemia 04/16/2017   Weakness of left hand 05/11/2016   Anxiety related tremor 05/11/2016   Essential hypertension    Normochromic normocytic anemia    TIA (transient ischemic attack) 02/26/2016  PCP: Delynn Flavin DO   REFERRING PROVIDER: Doristine Counter, MD  REFERRING DIAG: M17.11 (ICD-10-CM) - Unilateral primary osteoarthritis, right knee  THERAPY DIAG:  Acute pain of right knee  Stiffness of right knee, not elsewhere classified  Localized edema  Difficulty in walking, not elsewhere classified  Rationale for Evaluation and Treatment: Rehabilitation  ONSET DATE: June 21st 2024  SUBJECTIVE:   SUBJECTIVE STATEMENT: Left Knee doing much better  today.  PERTINENT HISTORY: Anxiety, Afib, CA, HTN, HLD, TIA, breast lumpectomy with radioactive seed and sentinel lymph node biopsy, knee surgery, cardioversion  PAIN:  Are you having pain? Yes: NPRS scale: 2/10 Pain location: Right distal/lateral hamstring. Pain description: discomfort Aggravating factors: pressure on the area Relieving factors: standing up  PRECAUTIONS: None  WEIGHT BEARING RESTRICTIONS: No  FALLS:  Has patient fallen in last 6 months? No  LIVING ENVIRONMENT: Lives with: lives alone Lives in: House/apartment Stairs: 2 STE to get to porch, then one more step to enter  Has following equipment at home: Single point cane and Environmental consultant - 2 wheeled  OCCUPATION: retired   PLOF: Independent, Independent with basic ADLs, Independent with gait, and Independent with transfers  PATIENT GOALS: get back to normal thing, be able to move better in general, be able to walk more smoothly   NEXT MD VISIT: Dr. Autumn Messing 7/23  OBJECTIVE:   DIAGNOSTIC FINDINGS:   PATIENT SURVEYS:  FOTO 43.3  COGNITION: Overall cognitive status: Within functional limits for tasks assessed     SENSATION: Not tested  EDEMA:  Appropriate for post-op state   No signs of redness/drainage/infection at incision, steri strips still in place    LOWER EXTREMITY ROM:  Active ROM Right eval Right AROM 07/15/23  Hip flexion    Hip extension    Hip abduction    Hip adduction    Hip internal rotation    Hip external rotation    Knee flexion 72* supine heel slide  106  Knee extension 8* supine with heel prop 4  Ankle dorsiflexion    Ankle plantarflexion    Ankle inversion    Ankle eversion     (Blank rows = not tested)  LOWER EXTREMITY MMT:  MMT Right eval Left eval  Hip flexion 3 3  Hip extension    Hip abduction    Hip adduction    Hip internal rotation    Hip external rotation    Knee flexion 3- 4+  Knee extension 3 4+  Ankle dorsiflexion 5 5  Ankle plantarflexion    Ankle  inversion    Ankle eversion     (Blank rows = not tested)     TODAY'S TREATMENT:                                                                                                                              DATE:  07-22-23 EXERCISE LOG  Exercise Repetitions and Resistance Comments  Nustep L5 x 18 minutes. Seat 5   Rocker board X   LAQ's 4# 3x10 pause at top 5 secs                   Discussed icing posterior aspect RT knee 2x daily Modalities Date: 07/19/23 Vaso: Knee, 34 degrees, low pressure, 15 mins, Pain and Edema  PATIENT EDUCATION:  Education details: Supine HSS Person educated: Patient Education method: Explanation, Demonstration, and Handouts Education comprehension: verbalized understanding and returned demonstration  HOME EXERCISE PROGRAM: HOME EXERCISE PROGRAM Created by [place] Applegate Jul 29th, 2024 View at [email or web address] using code: [email or web address]  Page 1 of 1 1 Exercise HAMSTRING STRETCH - SUPINE While lying on the ground, hold the back of your knee/thigh area and straighten your knee until a stretch is felt along the back of your leg. Repeat 3 Times Hold 1 Minute Complete 1 Set Perform 3 Times a Day  ASSESSMENT:  CLINICAL IMPRESSION:FOTO performed Patient did well today and was able to resume Therex for RT LE Passive right knee flexion to 115 degrees this week. Pt still with some soreness posterior aspect RT knee and was advised to ice at home 2 x daily  OBJECTIVE IMPAIRMENTS: Abnormal gait, decreased activity tolerance, decreased balance, decreased knowledge of use of DME, decreased mobility, difficulty walking, decreased ROM, decreased strength, increased edema, increased fascial restrictions, obesity, and pain.   ACTIVITY LIMITATIONS: sitting, standing, squatting, stairs, transfers, bed mobility, and locomotion level  PARTICIPATION LIMITATIONS: cleaning, laundry, driving, shopping,  community activity, yard work, and church  PERSONAL FACTORS: Age, Behavior pattern, Education, Fitness, Past/current experiences, Social background, and Time since onset of injury/illness/exacerbation are also affecting patient's functional outcome.   REHAB POTENTIAL: Good  CLINICAL DECISION MAKING: Stable/uncomplicated  EVALUATION COMPLEXITY: Low   GOALS: Goals reviewed with patient? Yes  SHORT TERM GOALS: Target date: 07/05/2023   Will be compliant with appropriate progressive HEP  Baseline: Goal status: MET  2.  R knee AROM to be no more than 5* extension and 100* flexion  Baseline:  Goal status:MET  3.  Will be able to ambulate household distances with SPC, good gait mechanics and no increase in pain  Baseline:  Goal status: MET  4.  Will be independent in edema management with regular icing and elevation Baseline:  Goal status: MET  LONG TERM GOALS: Target date: 07/26/2023   MMT to improve by one grade in all weak groups  Baseline:  Goal status: On going  2.  R knee flexion AROM to be at least 110* Baseline:  Goal status: Partially met  3.  Will be able to navigate at least 2 steps without rails and LRAD without difficulty  Baseline:  Goal status: On going  4.  Will tolerate kneeling tasks and will be able to come back up to standing without difficulty and no increase in pain R knee to allow her to return to yardwork and gardening tasks  Baseline:  Goal status: On going  5.  FOTO score to be within 5 points of predicted to show improved subjective status  Baseline:  Goal status: On going  PLAN:  PT FREQUENCY:  3x/week for 3 weeks, then 2x/week for the next 3 weeks   PT DURATION: 6 weeks  PLANNED INTERVENTIONS: Therapeutic exercises, Therapeutic activity, Neuromuscular re-education, Balance training, Gait training, Patient/Family education, Self Care, Joint mobilization, Stair training, DME instructions, Dry Needling, Electrical stimulation, Cryotherapy,  Moist heat, Taping,  Ultrasound, Ionotophoresis 4mg /ml Dexamethasone, Manual therapy, and Re-evaluation  PLAN FOR NEXT SESSION: R knee ROM, functional strength, gait training, edema control IFC if needed  [place] Applegate MPT

## 2023-07-26 ENCOUNTER — Ambulatory Visit: Payer: PPO | Admitting: *Deleted

## 2023-07-26 ENCOUNTER — Encounter: Payer: Self-pay | Admitting: *Deleted

## 2023-07-26 DIAGNOSIS — M25561 Pain in right knee: Secondary | ICD-10-CM | POA: Diagnosis not present

## 2023-07-26 DIAGNOSIS — R600 Localized edema: Secondary | ICD-10-CM

## 2023-07-26 DIAGNOSIS — M25661 Stiffness of right knee, not elsewhere classified: Secondary | ICD-10-CM

## 2023-07-26 NOTE — Therapy (Signed)
OUTPATIENT PHYSICAL THERAPY LOWER EXTREMITY TREATMENT   Patient Name: [name] Collier MRN: [id number] DOB:May 31, 1946, 77 y.o., female Today's Date: 07/26/2023  END OF SESSION:  PT End of Session - 07/26/23 1034     Visit Number 13    Number of Visits 16    Date for PT Re-Evaluation 07/26/23    Authorization Time Period 06/14/23 to 07/26/23    Progress Note Due on Visit 10    PT Start Time 1015    PT Stop Time 1106    PT Time Calculation (min) 51 min             Past Medical History:  Diagnosis Date   Anxiety    Atrial fibrillation (HCC)    Cancer (HCC) 11/2020   left breast IMC   GERD (gastroesophageal reflux disease)    HTN (hypertension)    Hyperlipidemia    Personal history of radiation therapy    TIA (transient ischemic attack)    Past Surgical History:  Procedure Laterality Date   BOTOX INJECTION N/A 12/22/2018   Procedure: BOTOX INJECTION INTO ANAL SPHINCTER;  Surgeon: Andria Meuse, MD;  Location: [facility];  Service: General;  Laterality: N/A;   BREAST BIOPSY Left 11/26/2020   BREAST LUMPECTOMY WITH RADIOACTIVE SEED AND SENTINEL LYMPH NODE BIOPSY Left 01/07/2021   Procedure: LEFT BREAST LUMPECTOMY WITH RADIOACTIVE SEED AND LEFT AXILLARY SENTINEL LYMPH NODE BIOPSY WITH BLUE DYE INJECTION;  Surgeon: Manus Rudd, MD;  Location: [facility];  Service: General;  Laterality: Left;  LMA WITH PECTORAL BLOCK, BLUE DYE INJECTION   CARDIOVERSION N/A 01/04/2018   Procedure: CARDIOVERSION;  Surgeon: Lars Masson, MD;  Location: [facility] ENDOSCOPY;  Service: Cardiovascular;  Laterality: N/A;   CARDIOVERSION N/A 09/20/2018   Procedure: CARDIOVERSION;  Surgeon: Quintella Reichert, MD;  Location: [facility];  Service: Cardiovascular;  Laterality: N/A;   CARDIOVERSION N/A 10/27/2018   Procedure: CARDIOVERSION;  Surgeon: Jodelle Red, MD;  Location: [facility] ENDOSCOPY;  Service: Cardiovascular;  Laterality: N/A;   KNEE SURGERY Bilateral    Torn miniscus    RECTAL EXAM UNDER ANESTHESIA N/A 12/22/2018   Procedure: ANORECTAL  EXAM UNDER ANESTHESIA;  Surgeon: Andria Meuse, MD;  Location: [facility];  Service: General;  Laterality: N/A;   RECTOCELE REPAIR     TONSILLECTOMY     VAGINAL HYSTERECTOMY     Patient Active Problem List   Diagnosis Date Noted   Hypercoagulable state due to persistent atrial fibrillation (HCC) 04/13/2023   Adenomatous polyp of colon 04/30/2022   Esophageal reflux 04/30/2022   Family history of malignant neoplasm of digestive organs 04/30/2022   History of cellulitis 04/28/2022   Encounter for monitoring dofetilide therapy 07/04/2021   Spleen injury 06/29/2021   Carcinoma of upper-inner quadrant of left breast in female, estrogen receptor positive (HCC) 12/20/2020   Trigger thumb of left hand 06/28/2019   Depression, major, single episode, mild (HCC) 03/14/2019   Generalized anxiety disorder 03/14/2019   PAF (paroxysmal atrial fibrillation) (HCC)    Osteoarthritis of knee 04/20/2018   Post-menopausal 02/22/2018   Snoring 01/12/2018   Hypokalemia 01/12/2018   Encounter for cardioversion procedure    Persistent atrial fibrillation 11/11/2017   SOB (shortness of breath) 11/11/2017   Other fatigue 11/11/2017   Hyperlipemia 04/16/2017   Weakness of left hand 05/11/2016   Anxiety related tremor 05/11/2016   Essential hypertension    Normochromic normocytic anemia    TIA (transient ischemic attack) 02/26/2016    PCP: Delynn Flavin DO  REFERRING PROVIDER: Doristine Counter, MD  REFERRING DIAG: M17.11 (ICD-10-CM) - Unilateral primary osteoarthritis, right knee  THERAPY DIAG:  Acute pain of right knee  Stiffness of right knee, not elsewhere classified  Localized edema  Rationale for Evaluation and Treatment: Rehabilitation  ONSET DATE: June 21st 2024  SUBJECTIVE:   SUBJECTIVE STATEMENT: Left Knee doing much better today.2/10 soreness is mainly in the back part  PERTINENT HISTORY: Anxiety, Afib, CA,  HTN, HLD, TIA, breast lumpectomy with radioactive seed and sentinel lymph node biopsy, knee surgery, cardioversion  PAIN:  Are you having pain? Yes: NPRS scale: 2/10 Pain location: Right distal/lateral hamstring. Pain description: discomfort Aggravating factors: pressure on the area Relieving factors: standing up  PRECAUTIONS: None  WEIGHT BEARING RESTRICTIONS: No  FALLS:  Has patient fallen in last 6 months? No  LIVING ENVIRONMENT: Lives with: lives alone Lives in: House/apartment Stairs: 2 STE to get to porch, then one more step to enter  Has following equipment at home: Single point cane and Environmental consultant - 2 wheeled  OCCUPATION: retired   PLOF: Independent, Independent with basic ADLs, Independent with gait, and Independent with transfers  PATIENT GOALS: get back to normal thing, be able to move better in general, be able to walk more smoothly   NEXT MD VISIT: Dr. Autumn Messing 7/23  OBJECTIVE:   DIAGNOSTIC FINDINGS:   PATIENT SURVEYS:  FOTO 43.3  COGNITION: Overall cognitive status: Within functional limits for tasks assessed     SENSATION: Not tested  EDEMA:  Appropriate for post-op state   No signs of redness/drainage/infection at incision, steri strips still in place    LOWER EXTREMITY ROM:  Active ROM Right eval Right AROM 07/15/23  Hip flexion    Hip extension    Hip abduction    Hip adduction    Hip internal rotation    Hip external rotation    Knee flexion 72* supine heel slide  106  Knee extension 8* supine with heel prop 4  Ankle dorsiflexion    Ankle plantarflexion    Ankle inversion    Ankle eversion     (Blank rows = not tested)  LOWER EXTREMITY MMT:  MMT Right eval Left eval  Hip flexion 3 3  Hip extension    Hip abduction    Hip adduction    Hip internal rotation    Hip external rotation    Knee flexion 3- 4+  Knee extension 3 4+  Ankle dorsiflexion 5 5  Ankle plantarflexion    Ankle inversion    Ankle eversion     (Blank rows =  not tested)     TODAY'S TREATMENT:                                                                                                                              DATE:  07-26-23 EXERCISE LOG  Exercise Repetitions and Resistance Comments  Nustep L5 x 18 minutes. Seat 5   Rocker board X   LAQ's    Stairs up/down Reciprocal pattern with Rail and SPC x5    6 in step up X 12  with UE assist           Modalities Date:  Vaso: Knee, 34 degrees, low pressure, 15 mins, Pain and Edema  PATIENT EDUCATION:  Education details: Supine HSS Person educated: Patient Education method: Explanation, Demonstration, and Handouts Education comprehension: verbalized understanding and returned demonstration  HOME EXERCISE PROGRAM: HOME EXERCISE PROGRAM Created by [place] Applegate Jul 29th, 2024 View at [email or web address] using code: [email or web address]  Page 1 of 1 1 Exercise HAMSTRING STRETCH - SUPINE While lying on the ground, hold the back of your knee/thigh area and straighten your knee until a stretch is felt along the back of your leg. Repeat 3 Times Hold 1 Minute Complete 1 Set Perform 3 Times a Day  ASSESSMENT:  CLINICAL IMPRESSION: Patient arrived today doing fairly well with RT knee and was able to continue with LE strengthening and balance. Pt was able to practice going up and down steps outside using a handrail and SPC with reciprocal pattern. Pt still with some soreness posterior aspect RT knee and was advised to ice at home 2 x daily  OBJECTIVE IMPAIRMENTS: Abnormal gait, decreased activity tolerance, decreased balance, decreased knowledge of use of DME, decreased mobility, difficulty walking, decreased ROM, decreased strength, increased edema, increased fascial restrictions, obesity, and pain.   ACTIVITY LIMITATIONS: sitting, standing, squatting, stairs, transfers, bed mobility, and locomotion level  PARTICIPATION LIMITATIONS:  cleaning, laundry, driving, shopping, community activity, yard work, and church  PERSONAL FACTORS: Age, Behavior pattern, Education, Fitness, Past/current experiences, Social background, and Time since onset of injury/illness/exacerbation are also affecting patient's functional outcome.   REHAB POTENTIAL: Good  CLINICAL DECISION MAKING: Stable/uncomplicated  EVALUATION COMPLEXITY: Low   GOALS: Goals reviewed with patient? Yes  SHORT TERM GOALS: Target date: 07/05/2023   Will be compliant with appropriate progressive HEP  Baseline: Goal status: MET  2.  R knee AROM to be no more than 5* extension and 100* flexion  Baseline:  Goal status:MET  3.  Will be able to ambulate household distances with SPC, good gait mechanics and no increase in pain  Baseline:  Goal status: MET  4.  Will be independent in edema management with regular icing and elevation Baseline:  Goal status: MET  LONG TERM GOALS: Target date: 07/26/2023   MMT to improve by one grade in all weak groups  Baseline:  Goal status: On going  2.  R knee flexion AROM to be at least 110* Baseline:  Goal status: Partially met  3.  Will be able to navigate at least 2 steps without rails and LRAD without difficulty  Baseline:  Goal status: On going  4.  Will tolerate kneeling tasks and will be able to come back up to standing without difficulty and no increase in pain R knee to allow her to return to yardwork and gardening tasks  Baseline:  Goal status: On going  5.  FOTO score to be within 5 points of predicted to show improved subjective status  Baseline:  Goal status: On going  PLAN:  PT FREQUENCY:  3x/week for 3 weeks, then 2x/week for the next 3 weeks   PT DURATION: 6 weeks  PLANNED INTERVENTIONS: Therapeutic exercises, Therapeutic activity, Neuromuscular re-education, Balance training, Gait training, Patient/Family education,  Self Care, Joint mobilization, Stair training, DME instructions, Dry Needling,  Electrical stimulation, Cryotherapy, Moist heat, Taping, Ultrasound, Ionotophoresis 4mg /ml Dexamethasone, Manual therapy, and Re-evaluation  PLAN FOR NEXT SESSION: R knee ROM, functional strength, gait training, edema control IFC if needed  [place] Applegate MPT

## 2023-07-29 ENCOUNTER — Ambulatory Visit: Payer: PPO | Admitting: Physical Therapy

## 2023-07-29 DIAGNOSIS — M25661 Stiffness of right knee, not elsewhere classified: Secondary | ICD-10-CM

## 2023-07-29 DIAGNOSIS — R600 Localized edema: Secondary | ICD-10-CM

## 2023-07-29 DIAGNOSIS — M25561 Pain in right knee: Secondary | ICD-10-CM

## 2023-07-29 DIAGNOSIS — R262 Difficulty in walking, not elsewhere classified: Secondary | ICD-10-CM

## 2023-07-29 NOTE — Therapy (Signed)
OUTPATIENT PHYSICAL THERAPY LOWER EXTREMITY TREATMENT   Patient Name: CARNELLA TIENDA MRN: [id number] DOB:04-Jul-1946, 77 y.o., female Today's Date: 07/29/2023  END OF SESSION:  PT End of Session - 07/29/23 1139     Visit Number 14    Number of Visits 16    Date for PT Re-Evaluation 07/26/23    PT Start Time 1015    PT Stop Time 1109    PT Time Calculation (min) 54 min    Activity Tolerance Patient tolerated treatment well    Behavior During Therapy [facility] for tasks assessed/performed             Past Medical History:  Diagnosis Date   Anxiety    Atrial fibrillation (HCC)    Cancer (HCC) 11/2020   left breast IMC   GERD (gastroesophageal reflux disease)    HTN (hypertension)    Hyperlipidemia    Personal history of radiation therapy    TIA (transient ischemic attack)    Past Surgical History:  Procedure Laterality Date   BOTOX INJECTION N/A 12/22/2018   Procedure: BOTOX INJECTION INTO ANAL SPHINCTER;  Surgeon: Andria Meuse, MD;  Location: [facility];  Service: General;  Laterality: N/A;   BREAST BIOPSY Left 11/26/2020   BREAST LUMPECTOMY WITH RADIOACTIVE SEED AND SENTINEL LYMPH NODE BIOPSY Left 01/07/2021   Procedure: LEFT BREAST LUMPECTOMY WITH RADIOACTIVE SEED AND LEFT AXILLARY SENTINEL LYMPH NODE BIOPSY WITH BLUE DYE INJECTION;  Surgeon: Manus Rudd, MD;  Location: [facility];  Service: General;  Laterality: Left;  LMA WITH PECTORAL BLOCK, BLUE DYE INJECTION   CARDIOVERSION N/A 01/04/2018   Procedure: CARDIOVERSION;  Surgeon: Lars Masson, MD;  Location: [facility] ENDOSCOPY;  Service: Cardiovascular;  Laterality: N/A;   CARDIOVERSION N/A 09/20/2018   Procedure: CARDIOVERSION;  Surgeon: Quintella Reichert, MD;  Location: [facility] ENDOSCOPY;  Service: Cardiovascular;  Laterality: N/A;   CARDIOVERSION N/A 10/27/2018   Procedure: CARDIOVERSION;  Surgeon: Jodelle Red, MD;  Location: [facility] ENDOSCOPY;  Service: Cardiovascular;  Laterality: N/A;   KNEE SURGERY  Bilateral    Torn miniscus   RECTAL EXAM UNDER ANESTHESIA N/A 12/22/2018   Procedure: ANORECTAL  EXAM UNDER ANESTHESIA;  Surgeon: Andria Meuse, MD;  Location: [facility];  Service: General;  Laterality: N/A;   RECTOCELE REPAIR     TONSILLECTOMY     VAGINAL HYSTERECTOMY     Patient Active Problem List   Diagnosis Date Noted   Hypercoagulable state due to persistent atrial fibrillation (HCC) 04/13/2023   Adenomatous polyp of colon 04/30/2022   Esophageal reflux 04/30/2022   Family history of malignant neoplasm of digestive organs 04/30/2022   History of cellulitis 04/28/2022   Encounter for monitoring dofetilide therapy 07/04/2021   Spleen injury 06/29/2021   Carcinoma of upper-inner quadrant of left breast in female, estrogen receptor positive (HCC) 12/20/2020   Trigger thumb of left hand 06/28/2019   Depression, major, single episode, mild (HCC) 03/14/2019   Generalized anxiety disorder 03/14/2019   PAF (paroxysmal atrial fibrillation) (HCC)    Osteoarthritis of knee 04/20/2018   Post-menopausal 02/22/2018   Snoring 01/12/2018   Hypokalemia 01/12/2018   Encounter for cardioversion procedure    Persistent atrial fibrillation 11/11/2017   SOB (shortness of breath) 11/11/2017   Other fatigue 11/11/2017   Hyperlipemia 04/16/2017   Weakness of left hand 05/11/2016   Anxiety related tremor 05/11/2016   Essential hypertension    Normochromic normocytic anemia    TIA (transient ischemic attack) 02/26/2016    PCP: Delynn Flavin  DO   REFERRING PROVIDER: Doristine Counter, MD  REFERRING DIAG: M17.11 (ICD-10-CM) - Unilateral primary osteoarthritis, right knee  THERAPY DIAG:  Acute pain of right knee  Stiffness of right knee, not elsewhere classified  Localized edema  Difficulty in walking, not elsewhere classified  Rationale for Evaluation and Treatment: Rehabilitation  ONSET DATE: June 21st 2024  SUBJECTIVE:   SUBJECTIVE STATEMENT: Back of left knee stiff in morning.   Annoying.  PERTINENT HISTORY: Anxiety, Afib, CA, HTN, HLD, TIA, breast lumpectomy with radioactive seed and sentinel lymph node biopsy, knee surgery, cardioversion  PAIN:  Are you having pain? Yes: NPRS scale: 3/10 Pain location: Right distal/lateral hamstring. Pain description: discomfort Aggravating factors: pressure on the area Relieving factors: standing up  PRECAUTIONS: None  WEIGHT BEARING RESTRICTIONS: No  FALLS:  Has patient fallen in last 6 months? No  LIVING ENVIRONMENT: Lives with: lives alone Lives in: House/apartment Stairs: 2 STE to get to porch, then one more step to enter  Has following equipment at home: Single point cane and Environmental consultant - 2 wheeled  OCCUPATION: retired   PLOF: Independent, Independent with basic ADLs, Independent with gait, and Independent with transfers  PATIENT GOALS: get back to normal thing, be able to move better in general, be able to walk more smoothly   NEXT MD VISIT: Dr. Autumn Messing 7/23  OBJECTIVE:   DIAGNOSTIC FINDINGS:   PATIENT SURVEYS:  FOTO 43.3  COGNITION: Overall cognitive status: Within functional limits for tasks assessed     SENSATION: Not tested  EDEMA:  Appropriate for post-op state   No signs of redness/drainage/infection at incision, steri strips still in place    LOWER EXTREMITY ROM:  Active ROM Right eval Right AROM 07/15/23  Hip flexion    Hip extension    Hip abduction    Hip adduction    Hip internal rotation    Hip external rotation    Knee flexion 72* supine heel slide  106  Knee extension 8* supine with heel prop 4  Ankle dorsiflexion    Ankle plantarflexion    Ankle inversion    Ankle eversion     (Blank rows = not tested)  LOWER EXTREMITY MMT:  MMT Right eval Left eval  Hip flexion 3 3  Hip extension    Hip abduction    Hip adduction    Hip internal rotation    Hip external rotation    Knee flexion 3- 4+  Knee extension 3 4+  Ankle dorsiflexion 5 5  Ankle plantarflexion     Ankle inversion    Ankle eversion     (Blank rows = not tested)     TODAY'S TREATMENT:                                                                                                                              DATE:  07-29-23 EXERCISE LOG  Exercise Repetitions and Resistance Comments  Nustep L3 x 16 minutes.                           STW/M x 7 minutes to patient's right posterior knee, distal hams and proximal Gastroc.  Modalities Date:  Vaso: Knee, 34 degrees, low pressure, 20 mins, Pain and Edema with IFC at 80-150 Hz.  PATIENT EDUCATION:  Education details: Supine HSS Person educated: Patient Education method: Explanation, Demonstration, and Handouts Education comprehension: verbalized understanding and returned demonstration  HOME EXERCISE PROGRAM: HOME EXERCISE PROGRAM Created by [place] Damier Disano Jul 29th, 2024 View at [email or web address] using code: [email or web address]  Page 1 of 1 1 Exercise HAMSTRING STRETCH - SUPINE While lying on the ground, hold the back of your knee/thigh area and straighten your knee until a stretch is felt along the back of your leg. Repeat 3 Times Hold 1 Minute Complete 1 Set Perform 3 Times a Day  ASSESSMENT:  CLINICAL IMPRESSION: Patient, overall, has made very good progress.  CC is stiff in right knee in am.  She is walking with a very good gait pattern with a straight cane.    OBJECTIVE IMPAIRMENTS: Abnormal gait, decreased activity tolerance, decreased balance, decreased knowledge of use of DME, decreased mobility, difficulty walking, decreased ROM, decreased strength, increased edema, increased fascial restrictions, obesity, and pain.   ACTIVITY LIMITATIONS: sitting, standing, squatting, stairs, transfers, bed mobility, and locomotion level  PARTICIPATION LIMITATIONS: cleaning, laundry, driving, shopping, community activity, yard work, and church  PERSONAL FACTORS: Age,  Behavior pattern, Education, Fitness, Past/current experiences, Social background, and Time since onset of injury/illness/exacerbation are also affecting patient's functional outcome.   REHAB POTENTIAL: Good  CLINICAL DECISION MAKING: Stable/uncomplicated  EVALUATION COMPLEXITY: Low   GOALS: Goals reviewed with patient? Yes  SHORT TERM GOALS: Target date: 07/05/2023   Will be compliant with appropriate progressive HEP  Baseline: Goal status: MET  2.  R knee AROM to be no more than 5* extension and 100* flexion  Baseline:  Goal status:MET  3.  Will be able to ambulate household distances with SPC, good gait mechanics and no increase in pain  Baseline:  Goal status: MET  4.  Will be independent in edema management with regular icing and elevation Baseline:  Goal status: MET  LONG TERM GOALS: Target date: 07/26/2023   MMT to improve by one grade in all weak groups  Baseline:  Goal status: On going  2.  R knee flexion AROM to be at least 110* Baseline:  Goal status: Partially met  3.  Will be able to navigate at least 2 steps without rails and LRAD without difficulty  Baseline:  Goal status: On going  4.  Will tolerate kneeling tasks and will be able to come back up to standing without difficulty and no increase in pain R knee to allow her to return to yardwork and gardening tasks  Baseline:  Goal status: On going  5.  FOTO score to be within 5 points of predicted to show improved subjective status  Baseline:  Goal status: On going  PLAN:  PT FREQUENCY:  3x/week for 3 weeks, then 2x/week for the next 3 weeks   PT DURATION: 6 weeks  PLANNED INTERVENTIONS: Therapeutic exercises, Therapeutic activity, Neuromuscular re-education, Balance training, Gait training, Patient/Family education, Self Care, Joint mobilization, Stair training, DME instructions, Dry Needling, Electrical stimulation, Cryotherapy, Moist heat, Taping, Ultrasound, Ionotophoresis 4mg /ml Dexamethasone,  Manual therapy, and Re-evaluation  PLAN FOR NEXT SESSION: R knee ROM, functional strength, gait training, edema control IFC if needed  [place] Iesha Summerhill MPT

## 2023-08-04 ENCOUNTER — Ambulatory Visit: Payer: PPO

## 2023-08-04 DIAGNOSIS — M25561 Pain in right knee: Secondary | ICD-10-CM

## 2023-08-04 DIAGNOSIS — R600 Localized edema: Secondary | ICD-10-CM

## 2023-08-04 DIAGNOSIS — M25661 Stiffness of right knee, not elsewhere classified: Secondary | ICD-10-CM

## 2023-08-04 DIAGNOSIS — R262 Difficulty in walking, not elsewhere classified: Secondary | ICD-10-CM

## 2023-08-04 NOTE — Therapy (Signed)
OUTPATIENT PHYSICAL THERAPY LOWER EXTREMITY TREATMENT   Patient Name: [name] Collier MRN: [id number] DOB:[birth year], 77 y.o., female Today's Date: 08/04/2023  END OF SESSION:  PT End of Session - 08/04/23 1025     Visit Number 15    Number of Visits 16    Date for PT Re-Evaluation 07/26/23    PT Start Time 1015    PT Stop Time 1116    PT Time Calculation (min) 61 min    Activity Tolerance Patient tolerated treatment well    Behavior During Therapy [facility] for tasks assessed/performed             Past Medical History:  Diagnosis Date   Anxiety    Atrial fibrillation (HCC)    Cancer (HCC) 11/2020   left breast IMC   GERD (gastroesophageal reflux disease)    HTN (hypertension)    Hyperlipidemia    Personal history of radiation therapy    TIA (transient ischemic attack)    Past Surgical History:  Procedure Laterality Date   BOTOX INJECTION N/A 12/22/2018   Procedure: BOTOX INJECTION INTO ANAL SPHINCTER;  Surgeon: Andria Meuse, MD;  Location: [facility];  Service: General;  Laterality: N/A;   BREAST BIOPSY Left 11/26/2020   BREAST LUMPECTOMY WITH RADIOACTIVE SEED AND SENTINEL LYMPH NODE BIOPSY Left 01/07/2021   Procedure: LEFT BREAST LUMPECTOMY WITH RADIOACTIVE SEED AND LEFT AXILLARY SENTINEL LYMPH NODE BIOPSY WITH BLUE DYE INJECTION;  Surgeon: Manus Rudd, MD;  Location: [facility];  Service: General;  Laterality: Left;  LMA WITH PECTORAL BLOCK, BLUE DYE INJECTION   CARDIOVERSION N/A 01/04/2018   Procedure: CARDIOVERSION;  Surgeon: Lars Masson, MD;  Location: [facility] ENDOSCOPY;  Service: Cardiovascular;  Laterality: N/A;   CARDIOVERSION N/A 09/20/2018   Procedure: CARDIOVERSION;  Surgeon: Quintella Reichert, MD;  Location: [facility]OSCOPY;  Service: Cardiovascular;  Laterality: N/A;   CARDIOVERSION N/A 10/27/2018   Procedure: CARDIOVERSION;  Surgeon: Jodelle Red, MD;  Location: [facility] ENDOSCOPY;  Service: Cardiovascular;  Laterality: N/A;   KNEE SURGERY  Bilateral    Torn miniscus   RECTAL EXAM UNDER ANESTHESIA N/A 12/22/2018   Procedure: ANORECTAL  EXAM UNDER ANESTHESIA;  Surgeon: Andria Meuse, MD;  Location: [facility];  Service: General;  Laterality: N/A;   RECTOCELE REPAIR     TONSILLECTOMY     VAGINAL HYSTERECTOMY     Patient Active Problem List   Diagnosis Date Noted   Hypercoagulable state due to persistent atrial fibrillation (HCC) 04/13/2023   Adenomatous polyp of colon 04/30/2022   Esophageal reflux 04/30/2022   Family history of malignant neoplasm of digestive organs 04/30/2022   History of cellulitis 04/28/2022   Encounter for monitoring dofetilide therapy 07/04/2021   Spleen injury 06/29/2021   Carcinoma of upper-inner quadrant of left breast in female, estrogen receptor positive (HCC) 12/20/2020   Trigger thumb of left hand 06/28/2019   Depression, major, single episode, mild (HCC) 03/14/2019   Generalized anxiety disorder 03/14/2019   PAF (paroxysmal atrial fibrillation) (HCC)    Osteoarthritis of knee 04/20/2018   Post-menopausal 02/22/2018   Snoring 01/12/2018   Hypokalemia 01/12/2018   Encounter for cardioversion procedure    Persistent atrial fibrillation 11/11/2017   SOB (shortness of breath) 11/11/2017   Other fatigue 11/11/2017   Hyperlipemia 04/16/2017   Weakness of left hand 05/11/2016   Anxiety related tremor 05/11/2016   Essential hypertension    Normochromic normocytic anemia    TIA (transient ischemic attack) 02/26/2016    PCP: Delynn Flavin  DO   REFERRING PROVIDER: Doristine Counter, MD  REFERRING DIAG: M17.11 (ICD-10-CM) - Unilateral primary osteoarthritis, right knee  THERAPY DIAG:  Acute pain of right knee  Stiffness of right knee, not elsewhere classified  Localized edema  Difficulty in walking, not elsewhere classified  Rationale for Evaluation and Treatment: Rehabilitation  ONSET DATE: June 21st 2024  SUBJECTIVE:   SUBJECTIVE STATEMENT: Pt reports 1-2/10 right knee  soreness today.    PERTINENT HISTORY: Anxiety, Afib, CA, HTN, HLD, TIA, breast lumpectomy with radioactive seed and sentinel lymph node biopsy, knee surgery, cardioversion  PAIN:  Are you having pain? Yes: NPRS scale: 1-2/10 Pain location: Right distal/lateral hamstring. Pain description: discomfort Aggravating factors: pressure on the area Relieving factors: standing up  PRECAUTIONS: None  WEIGHT BEARING RESTRICTIONS: No  FALLS:  Has patient fallen in last 6 months? No  LIVING ENVIRONMENT: Lives with: lives alone Lives in: House/apartment Stairs: 2 STE to get to porch, then one more step to enter  Has following equipment at home: Single point cane and Environmental consultant - 2 wheeled  OCCUPATION: retired   PLOF: Independent, Independent with basic ADLs, Independent with gait, and Independent with transfers  PATIENT GOALS: get back to normal thing, be able to move better in general, be able to walk more smoothly   NEXT MD VISIT: Dr. Autumn Messing 7/23  OBJECTIVE:   DIAGNOSTIC FINDINGS:   PATIENT SURVEYS:  FOTO 43.3  COGNITION: Overall cognitive status: Within functional limits for tasks assessed     SENSATION: Not tested  EDEMA:  Appropriate for post-op state   No signs of redness/drainage/infection at incision, steri strips still in place    LOWER EXTREMITY ROM:  Active ROM Right eval Right AROM 07/15/23  Hip flexion    Hip extension    Hip abduction    Hip adduction    Hip internal rotation    Hip external rotation    Knee flexion 72* supine heel slide  106  Knee extension 8* supine with heel prop 4  Ankle dorsiflexion    Ankle plantarflexion    Ankle inversion    Ankle eversion     (Blank rows = not tested)  LOWER EXTREMITY MMT:  MMT Right eval Left eval  Hip flexion 3 3  Hip extension    Hip abduction    Hip adduction    Hip internal rotation    Hip external rotation    Knee flexion 3- 4+  Knee extension 3 4+  Ankle dorsiflexion 5 5  Ankle  plantarflexion    Ankle inversion    Ankle eversion     (Blank rows = not tested)     TODAY'S TREATMENT:                                                                                                                              DATE:  08-04-23 EXERCISE LOG  Exercise Repetitions and Resistance Comments  Nustep L4 x 16 minutes.   Rockerboard 5 mins   Stairs Up/Down 5 reps with reciprocal pattern   Standing Marches Airex x 2 mins   LAQs 5# x 25 reps    Seated Ham Curls Green x 20 reps   Seated Hip Abduction Green x 2 mins    Modalities Date:  Vaso: Knee, 34 degrees, low pressure, 15 mins, Pain and Edema with IFC at 80-150 Hz.  PATIENT EDUCATION:  Education details: Supine HSS Person educated: Patient Education method: Explanation, Demonstration, and Handouts Education comprehension: verbalized understanding and returned demonstration  HOME EXERCISE PROGRAM: HOME EXERCISE PROGRAM Created by [place] Applegate Jul 29th, 2024 View at [email or web address] using code: [email or web address]  Page 1 of 1 1 Exercise HAMSTRING STRETCH - SUPINE While lying on the ground, hold the back of your knee/thigh area and straighten your knee until a stretch is felt along the back of your leg. Repeat 3 Times Hold 1 Minute Complete 1 Set Perform 3 Times a Day  ASSESSMENT:  CLINICAL IMPRESSION: Pt arrives for today's treatment session reporting 1-2/10 right hamstring soreness.  Pt reports that she is feeling much better since beginning therapy and has an MD appointment next week.  Pt introduced to standing marches on Airex with pt requiring BUEs for support and safety.  Pt able to tolerate increased weight/resistance with seated exercises today without issue.  Normal responses to vaso noted upon removal.  Pt denied any pain at completion of today's treatment.   OBJECTIVE IMPAIRMENTS: Abnormal gait, decreased activity tolerance, decreased balance,  decreased knowledge of use of DME, decreased mobility, difficulty walking, decreased ROM, decreased strength, increased edema, increased fascial restrictions, obesity, and pain.   ACTIVITY LIMITATIONS: sitting, standing, squatting, stairs, transfers, bed mobility, and locomotion level  PARTICIPATION LIMITATIONS: cleaning, laundry, driving, shopping, community activity, yard work, and church  PERSONAL FACTORS: Age, Behavior pattern, Education, Fitness, Past/current experiences, Social background, and Time since onset of injury/illness/exacerbation are also affecting patient's functional outcome.   REHAB POTENTIAL: Good  CLINICAL DECISION MAKING: Stable/uncomplicated  EVALUATION COMPLEXITY: Low   GOALS: Goals reviewed with patient? Yes  SHORT TERM GOALS: Target date: 07/05/2023   Will be compliant with appropriate progressive HEP  Baseline: Goal status: MET  2.  R knee AROM to be no more than 5* extension and 100* flexion  Baseline:  Goal status:MET  3.  Will be able to ambulate household distances with SPC, good gait mechanics and no increase in pain  Baseline:  Goal status: MET  4.  Will be independent in edema management with regular icing and elevation Baseline:  Goal status: MET  LONG TERM GOALS: Target date: 07/26/2023   MMT to improve by one grade in all weak groups  Baseline:  Goal status: On going  2.  R knee flexion AROM to be at least 110* Baseline:  Goal status: Partially met  3.  Will be able to navigate at least 2 steps without rails and LRAD without difficulty  Baseline:  Goal status: On going  4.  Will tolerate kneeling tasks and will be able to come back up to standing without difficulty and no increase in pain R knee to allow her to return to yardwork and gardening tasks  Baseline:  Goal status: On going  5.  FOTO score to be within 5 points of predicted to show improved subjective status  Baseline:  Goal status: On going  PLAN:  PT FREQUENCY:  3x/week for 3 weeks, then 2x/week for the next 3 weeks   PT DURATION: 6 weeks  PLANNED INTERVENTIONS: Therapeutic exercises, Therapeutic activity, Neuromuscular re-education, Balance training, Gait training, Patient/Family education, Self Care, Joint mobilization, Stair training, DME instructions, Dry Needling, Electrical stimulation, Cryotherapy, Moist heat, Taping, Ultrasound, Ionotophoresis 4mg /ml Dexamethasone, Manual therapy, and Re-evaluation  PLAN FOR NEXT SESSION: R knee ROM, functional strength, gait training, edema control IFC if needed  Rexene Agent, PTA

## 2023-08-11 ENCOUNTER — Ambulatory Visit: Payer: PPO | Attending: Orthopedic Surgery

## 2023-08-11 DIAGNOSIS — M25561 Pain in right knee: Secondary | ICD-10-CM | POA: Insufficient documentation

## 2023-08-11 DIAGNOSIS — R600 Localized edema: Secondary | ICD-10-CM | POA: Diagnosis not present

## 2023-08-11 DIAGNOSIS — M25661 Stiffness of right knee, not elsewhere classified: Secondary | ICD-10-CM | POA: Insufficient documentation

## 2023-08-11 DIAGNOSIS — R262 Difficulty in walking, not elsewhere classified: Secondary | ICD-10-CM | POA: Diagnosis not present

## 2023-08-11 NOTE — Addendum Note (Signed)
Addended by: Nelvin Tomb, [place] W on: 08/11/2023 12:43 PM   Modules accepted: Orders

## 2023-08-11 NOTE — Therapy (Signed)
OUTPATIENT PHYSICAL THERAPY LOWER EXTREMITY TREATMENT   Patient Name: [name] Collier MRN: [id number] DOB:Apr 13, 1946, 77 y.o., female Today's Date: 08/11/2023  END OF SESSION:  PT End of Session - 08/11/23 1021     Visit Number 16    Number of Visits 16    Date for PT Re-Evaluation 07/26/23    PT Start Time 1015    PT Stop Time 1117    PT Time Calculation (min) 62 min    Activity Tolerance Patient tolerated treatment well    Behavior During Therapy [facility] for tasks assessed/performed             Past Medical History:  Diagnosis Date   Anxiety    Atrial fibrillation (HCC)    Cancer (HCC) 11/2020   left breast IMC   GERD (gastroesophageal reflux disease)    HTN (hypertension)    Hyperlipidemia    Personal history of radiation therapy    TIA (transient ischemic attack)    Past Surgical History:  Procedure Laterality Date   BOTOX INJECTION N/A 12/22/2018   Procedure: BOTOX INJECTION INTO ANAL SPHINCTER;  Surgeon: Andria Meuse, MD;  Location: [facility];  Service: General;  Laterality: N/A;   BREAST BIOPSY Left 11/26/2020   BREAST LUMPECTOMY WITH RADIOACTIVE SEED AND SENTINEL LYMPH NODE BIOPSY Left 01/07/2021   Procedure: LEFT BREAST LUMPECTOMY WITH RADIOACTIVE SEED AND LEFT AXILLARY SENTINEL LYMPH NODE BIOPSY WITH BLUE DYE INJECTION;  Surgeon: Manus Rudd, MD;  Location: [facility];  Service: General;  Laterality: Left;  LMA WITH PECTORAL BLOCK, BLUE DYE INJECTION   CARDIOVERSION N/A 01/04/2018   Procedure: CARDIOVERSION;  Surgeon: Lars Masson, MD;  Location: [facility] END[facility]COPY;  Service: Cardiovascular;  Laterality: N/A;   CARDIOVERSION N/A 09/20/2018   Procedure: CARDIOVERSION;  Surgeon: Quintella Reichert, MD;  Location: [facility] ENDOSCOPY;  Service: Cardiovascular;  Laterality: N/A;   CARDIOVERSION N/A 10/27/2018   Procedure: CARDIOVERSION;  Surgeon: Jodelle Red, MD;  Location: [facility] ENDOSCOPY;  Service: Cardiovascular;  Laterality: N/A;   KNEE SURGERY  Bilateral    Torn miniscus   RECTAL EXAM UNDER ANESTHESIA N/A 12/22/2018   Procedure: ANORECTAL  EXAM UNDER ANESTHESIA;  Surgeon: Andria Meuse, MD;  Location: [facility];  Service: General;  Laterality: N/A;   RECTOCELE REPAIR     TONSILLECTOMY     VAGINAL HYSTERECTOMY     Patient Active Problem List   Diagnosis Date Noted   Hypercoagulable state due to persistent atrial fibrillation (HCC) 04/13/2023   Adenomatous polyp of colon 04/30/2022   Esophageal reflux 04/30/2022   Family history of malignant neoplasm of digestive organs 04/30/2022   History of cellulitis 04/28/2022   Encounter for monitoring dofetilide therapy 07/04/2021   Spleen injury 06/29/2021   Carcinoma of upper-inner quadrant of left breast in female, estrogen receptor positive (HCC) 12/20/2020   Trigger thumb of left hand 06/28/2019   Depression, major, single episode, mild (HCC) 03/14/2019   Generalized anxiety disorder 03/14/2019   PAF (paroxysmal atrial fibrillation) (HCC)    Osteoarthritis of knee 04/20/2018   Post-menopausal 02/22/2018   Snoring 01/12/2018   Hypokalemia 01/12/2018   Encounter for cardioversion procedure    Persistent atrial fibrillation 11/11/2017   SOB (shortness of breath) 11/11/2017   Other fatigue 11/11/2017   Hyperlipemia 04/16/2017   Weakness of left hand 05/11/2016   Anxiety related tremor 05/11/2016   Essential hypertension    Normochromic normocytic anemia    TIA (transient ischemic attack) 02/26/2016    PCP: Delynn Flavin  DO   REFERRING PROVIDER: Doristine Counter, MD  REFERRING DIAG: M17.11 (ICD-10-CM) - Unilateral primary osteoarthritis, right knee  THERAPY DIAG:  Acute pain of right knee  Stiffness of right knee, not elsewhere classified  Localized edema  Difficulty in walking, not elsewhere classified  Rationale for Evaluation and Treatment: Rehabilitation  ONSET DATE: June 21st 2024  SUBJECTIVE:   SUBJECTIVE STATEMENT: Pt reports 1-2/10 right knee  soreness today.    PERTINENT HISTORY: Anxiety, Afib, CA, HTN, HLD, TIA, breast lumpectomy with radioactive seed and sentinel lymph node biopsy, knee surgery, cardioversion  PAIN:  Are you having pain? Yes: NPRS scale: 1-2/10 Pain location: Right distal/lateral hamstring. Pain description: discomfort Aggravating factors: pressure on the area Relieving factors: standing up  PRECAUTIONS: None  WEIGHT BEARING RESTRICTIONS: No  FALLS:  Has patient fallen in last 6 months? No  LIVING ENVIRONMENT: Lives with: lives alone Lives in: House/apartment Stairs: 2 STE to get to porch, then one more step to enter  Has following equipment at home: Single point cane and Environmental consultant - 2 wheeled  OCCUPATION: retired   PLOF: Independent, Independent with basic ADLs, Independent with gait, and Independent with transfers  PATIENT GOALS: get back to normal thing, be able to move better in general, be able to walk more smoothly   NEXT MD VISIT: Dr. Autumn Messing 7/23  OBJECTIVE:   DIAGNOSTIC FINDINGS:   PATIENT SURVEYS:  FOTO 43.3  COGNITION: Overall cognitive status: Within functional limits for tasks assessed     SENSATION: Not tested  EDEMA:  Appropriate for post-op state   No signs of redness/drainage/infection at incision, steri strips still in place    LOWER EXTREMITY ROM:  Active ROM Right eval Right AROM 07/15/23  Hip flexion    Hip extension    Hip abduction    Hip adduction    Hip internal rotation    Hip external rotation    Knee flexion 72* supine heel slide  106  Knee extension 8* supine with heel prop 4  Ankle dorsiflexion    Ankle plantarflexion    Ankle inversion    Ankle eversion     (Blank rows = not tested)  LOWER EXTREMITY MMT:  MMT Right eval Left eval  Hip flexion 3 3  Hip extension    Hip abduction    Hip adduction    Hip internal rotation    Hip external rotation    Knee flexion 3- 4+  Knee extension 3 4+  Ankle dorsiflexion 5 5  Ankle  plantarflexion    Ankle inversion    Ankle eversion     (Blank rows = not tested)     TODAY'S TREATMENT:                                                                                                                              DATE:  08-11-23 EXERCISE LOG  Exercise Repetitions and Resistance Comments  Nustep L4 x 16 minutes.   Rockerboard 5 mins   Stairs Up/Down 5 reps with reciprocal pattern   Standing Marches Airex x 3 mins   LAQs 5# x 25 reps    Seated Ham Curls Green x 20 reps   Seated Hip Abduction Green x 2 mins    Modalities Date:  Vaso: Knee, 34 degrees, low pressure, 15 mins, Pain and Edema   PATIENT EDUCATION:  Education details: Supine HSS Person educated: Patient Education method: Explanation, Demonstration, and Handouts Education comprehension: verbalized understanding and returned demonstration  HOME EXERCISE PROGRAM: HOME EXERCISE PROGRAM Created by [place] Applegate Jul 29th, 2024 View at [email or web address] using code: [email or web address]  Page 1 of 1 1 Exercise HAMSTRING STRETCH - SUPINE While lying on the ground, hold the back of your knee/thigh area and straighten your knee until a stretch is felt along the back of your leg. Repeat 3 Times Hold 1 Minute Complete 1 Set Perform 3 Times a Day  ASSESSMENT:  CLINICAL IMPRESSION: Pt arrives for today's treatment session reporting 1-2/10 right distal hamstring pain.  Pt able to demonstrate 111 degrees of right knee flexion today meeting her LTG.  Pt is also able to navigate 2 stairs without use of rail or LRAD with minimal difficulty.  Pt able to demonstrate 4+/5 global strength in RLE strength.  Pt is making good progress towards her goals, but would benefit from continuation of current POC to address further deficits and limitations.  Normal responses to vaso noted upon removal.  Pt denied any pain at completion of today's treatment session.   OBJECTIVE  IMPAIRMENTS: Abnormal gait, decreased activity tolerance, decreased balance, decreased knowledge of use of DME, decreased mobility, difficulty walking, decreased ROM, decreased strength, increased edema, increased fascial restrictions, obesity, and pain.   ACTIVITY LIMITATIONS: sitting, standing, squatting, stairs, transfers, bed mobility, and locomotion level  PARTICIPATION LIMITATIONS: cleaning, laundry, driving, shopping, community activity, yard work, and church  PERSONAL FACTORS: Age, Behavior pattern, Education, Fitness, Past/current experiences, Social background, and Time since onset of injury/illness/exacerbation are also affecting patient's functional outcome.   REHAB POTENTIAL: Good  CLINICAL DECISION MAKING: Stable/uncomplicated  EVALUATION COMPLEXITY: Low   GOALS: Goals reviewed with patient? Yes  SHORT TERM GOALS: Target date: 07/05/2023   Will be compliant with appropriate progressive HEP  Baseline: Goal status: MET  2.  R knee AROM to be no more than 5* extension and 100* flexion  Baseline:  Goal status:MET  3.  Will be able to ambulate household distances with SPC, good gait mechanics and no increase in pain  Baseline:  Goal status: MET  4.  Will be independent in edema management with regular icing and elevation Baseline:  Goal status: MET  LONG TERM GOALS: Target date: 07/26/2023   MMT to improve by one grade in all weak groups  Baseline:  Goal status: MET  2.  R knee flexion AROM to be at least 110* Baseline:  Goal status: Partially met  3.  Will be able to navigate at least 2 steps without rails and LRAD without difficulty  Baseline:  Goal status: MET  4.  Will tolerate kneeling tasks and will be able to come back up to standing without difficulty and no increase in pain R knee to allow her to return to yardwork and gardening tasks  Baseline:  Goal status: On going  5.  FOTO score to be within 5 points of predicted to show improved subjective  status  Baseline:  Goal status: MET  PLAN:  PT FREQUENCY:  3x/week for 3 weeks, then 2x/week for the next 3 weeks   PT DURATION: 6 weeks  PLANNED INTERVENTIONS: Therapeutic exercises, Therapeutic activity, Neuromuscular re-education, Balance training, Gait training, Patient/Family education, Self Care, Joint mobilization, Stair training, DME instructions, Dry Needling, Electrical stimulation, Cryotherapy, Moist heat, Taping, Ultrasound, Ionotophoresis 4mg /ml Dexamethasone, Manual therapy, and Re-evaluation  PLAN FOR NEXT SESSION: R knee ROM, functional strength, gait training, edema control IFC if needed  Rexene Agent, PTA

## 2023-08-18 ENCOUNTER — Ambulatory Visit: Payer: PPO

## 2023-08-18 DIAGNOSIS — R600 Localized edema: Secondary | ICD-10-CM

## 2023-08-18 DIAGNOSIS — M25561 Pain in right knee: Secondary | ICD-10-CM

## 2023-08-18 DIAGNOSIS — M25661 Stiffness of right knee, not elsewhere classified: Secondary | ICD-10-CM

## 2023-08-18 DIAGNOSIS — R262 Difficulty in walking, not elsewhere classified: Secondary | ICD-10-CM

## 2023-08-18 NOTE — Therapy (Signed)
OUTPATIENT PHYSICAL THERAPY LOWER EXTREMITY TREATMENT   Patient Name: [name] Collier MRN: [id number] DOB:[birth year], 77 y.o., female Today's Date: 08/18/2023  END OF SESSION:  PT End of Session - 08/18/23 1019     Visit Number 17    Number of Visits 19    Date for PT Re-Evaluation 08/30/23    PT Start Time 1015    PT Stop Time 1118    PT Time Calculation (min) 63 min    Activity Tolerance Patient tolerated treatment well    Behavior During Therapy [facility] for tasks assessed/performed             Past Medical History:  Diagnosis Date   Anxiety    Atrial fibrillation (HCC)    Cancer (HCC) 11/2020   left breast IMC   GERD (gastroesophageal reflux disease)    HTN (hypertension)    Hyperlipidemia    Personal history of radiation therapy    TIA (transient ischemic attack)    Past Surgical History:  Procedure Laterality Date   BOTOX INJECTION N/A 12/22/2018   Procedure: BOTOX INJECTION INTO ANAL SPHINCTER;  Surgeon: Andria Meuse, MD;  Location: [facility];  Service: General;  Laterality: N/A;   BREAST BIOPSY Left 11/26/2020   BREAST LUMPECTOMY WITH RADIOACTIVE SEED AND SENTINEL LYMPH NODE BIOPSY Left 01/07/2021   Procedure: LEFT BREAST LUMPECTOMY WITH RADIOACTIVE SEED AND LEFT AXILLARY SENTINEL LYMPH NODE BIOPSY WITH BLUE DYE INJECTION;  Surgeon: Manus Rudd, MD;  Location: [facility];  Service: General;  Laterality: Left;  LMA WITH PECTORAL BLOCK, BLUE DYE INJECTION   CARDIOVERSION N/A 01/04/2018   Procedure: CARDIOVERSION;  Surgeon: Lars Masson, MD;  Location: [facility] ENDOSCOPY;  Service: Cardiovascular;  Laterality: N/A;   CARDIOVERSION N/A 09/20/2018   Procedure: CARDIOVERSION;  Surgeon: Quintella Reichert, MD;  Location: [facility] ENDOSCOPY;  Service: Cardiovascular;  Laterality: N/A;   CARDIOVERSION N/A 10/27/2018   Procedure: CARDIOVERSION;  Surgeon: Jodelle Red, MD;  Location: [facility] ENDOSCOPY;  Service: Cardiovascular;  Laterality: N/A;   KNEE SURGERY  Bilateral    Torn miniscus   RECTAL EXAM UNDER ANESTHESIA N/A 12/22/2018   Procedure: ANORECTAL  EXAM UNDER ANESTHESIA;  Surgeon: Andria Meuse, MD;  Location: [facility];  Service: General;  Laterality: N/A;   RECTOCELE REPAIR     TONSILLECTOMY     VAGINAL HYSTERECTOMY     Patient Active Problem List   Diagnosis Date Noted   Hypercoagulable state due to persistent atrial fibrillation (HCC) 04/13/2023   Adenomatous polyp of colon 04/30/2022   Esophageal reflux 04/30/2022   Family history of malignant neoplasm of digestive organs 04/30/2022   History of cellulitis 04/28/2022   Encounter for monitoring dofetilide therapy 07/04/2021   Spleen injury 06/29/2021   Carcinoma of upper-inner quadrant of left breast in female, estrogen receptor positive (HCC) 12/20/2020   Trigger thumb of left hand 06/28/2019   Depression, major, single episode, mild (HCC) 03/14/2019   Generalized anxiety disorder 03/14/2019   PAF (paroxysmal atrial fibrillation) (HCC)    Osteoarthritis of knee 04/20/2018   Post-menopausal 02/22/2018   Snoring 01/12/2018   Hypokalemia 01/12/2018   Encounter for cardioversion procedure    Persistent atrial fibrillation 11/11/2017   SOB (shortness of breath) 11/11/2017   Other fatigue 11/11/2017   Hyperlipemia 04/16/2017   Weakness of left hand 05/11/2016   Anxiety related tremor 05/11/2016   Essential hypertension    Normochromic normocytic anemia    TIA (transient ischemic attack) 02/26/2016    PCP: Delynn Flavin  DO   REFERRING PROVIDER: Doristine Counter, MD  REFERRING DIAG: M17.11 (ICD-10-CM) - Unilateral primary osteoarthritis, right knee  THERAPY DIAG:  Acute pain of right knee  Localized edema  Stiffness of right knee, not elsewhere classified  Difficulty in walking, not elsewhere classified  Rationale for Evaluation and Treatment: Rehabilitation  ONSET DATE: June 21st 2024  SUBJECTIVE:   SUBJECTIVE STATEMENT: Pt reports minimal right knee  discomfort today, "I wouldn't call it pain."  PERTINENT HISTORY: Anxiety, Afib, CA, HTN, HLD, TIA, breast lumpectomy with radioactive seed and sentinel lymph node biopsy, knee surgery, cardioversion  PAIN:  Are you having pain? No  PRECAUTIONS: None  WEIGHT BEARING RESTRICTIONS: No  FALLS:  Has patient fallen in last 6 months? No  LIVING ENVIRONMENT: Lives with: lives alone Lives in: House/apartment Stairs: 2 STE to get to porch, then one more step to enter  Has following equipment at home: Single point cane and Environmental consultant - 2 wheeled  OCCUPATION: retired   PLOF: Independent, Independent with basic ADLs, Independent with gait, and Independent with transfers  PATIENT GOALS: get back to normal thing, be able to move better in general, be able to walk more smoothly   NEXT MD VISIT: Dr. Autumn Messing 7/23  OBJECTIVE:   DIAGNOSTIC FINDINGS:   PATIENT SURVEYS:  FOTO 43.3  COGNITION: Overall cognitive status: Within functional limits for tasks assessed     SENSATION: Not tested  EDEMA:  Appropriate for post-op state   No signs of redness/drainage/infection at incision, steri strips still in place    LOWER EXTREMITY ROM:  Active ROM Right eval Right AROM 07/15/23  Hip flexion    Hip extension    Hip abduction    Hip adduction    Hip internal rotation    Hip external rotation    Knee flexion 72* supine heel slide  106  Knee extension 8* supine with heel prop 4  Ankle dorsiflexion    Ankle plantarflexion    Ankle inversion    Ankle eversion     (Blank rows = not tested)  LOWER EXTREMITY MMT:  MMT Right eval Left eval  Hip flexion 3 3  Hip extension    Hip abduction    Hip adduction    Hip internal rotation    Hip external rotation    Knee flexion 3- 4+  Knee extension 3 4+  Ankle dorsiflexion 5 5  Ankle plantarflexion    Ankle inversion    Ankle eversion     (Blank rows = not tested)     TODAY'S TREATMENT:                                                                                                                               DATE:08-18-23   EXERCISE LOG  Exercise Repetitions and Resistance Comments  Nustep L4 x 16 minutes.   Rockerboard 5 mins   Cybex Knee Flexion 30# x 3 mins   Cybex Knee Extension  10# x 3 mins   Cybex Leg Press 1 plate; seat 5 x 3 mins   Stairs Up/Down    Standing Marches    LAQs    Seated Ham Curls    Seated Hip Abduction     Manual Therapy Soft Tissue Mobilization: right distal hamstring, STW/M to right distal hamstring to decrease pain and tone  Modalities Date:  Vaso: Knee, 34 degrees, low pressure, 15 mins, Pain and Edema   PATIENT EDUCATION:  Education details: Supine HSS Person educated: Patient Education method: Explanation, Demonstration, and Handouts Education comprehension: verbalized understanding and returned demonstration  HOME EXERCISE PROGRAM: HOME EXERCISE PROGRAM Created by [place] Applegate Jul 29th, 2024 View at [email or web address] using code: [email or web address]  Page 1 of 1 1 Exercise HAMSTRING STRETCH - SUPINE While lying on the ground, hold the back of your knee/thigh area and straighten your knee until a stretch is felt along the back of your leg. Repeat 3 Times Hold 1 Minute Complete 1 Set Perform 3 Times a Day  ASSESSMENT:  CLINICAL IMPRESSION: Pt arrives for today's treatment session denying any pain, but does report very minimal discomfort.  Pt introduced to cybex knee flexion, extension, and leg press today with pt requiring min cues for proper technique, to perform full ROM, and to eccentric control with all cybex exercises.  STW/M performed to right distal hamstring to decrease pain and tone with pt in left side-lying and pillow between her knees for comfort.  Normal responses to vaso noted upon removal.  Pt denied any pain at completion of today's treatment session.   OBJECTIVE IMPAIRMENTS: Abnormal gait, decreased activity tolerance, decreased balance, decreased knowledge of  use of DME, decreased mobility, difficulty walking, decreased ROM, decreased strength, increased edema, increased fascial restrictions, obesity, and pain.   ACTIVITY LIMITATIONS: sitting, standing, squatting, stairs, transfers, bed mobility, and locomotion level  PARTICIPATION LIMITATIONS: cleaning, laundry, driving, shopping, community activity, yard work, and church  PERSONAL FACTORS: Age, Behavior pattern, Education, Fitness, Past/current experiences, Social background, and Time since onset of injury/illness/exacerbation are also affecting patient's functional outcome.   REHAB POTENTIAL: Good  CLINICAL DECISION MAKING: Stable/uncomplicated  EVALUATION COMPLEXITY: Low   GOALS: Goals reviewed with patient? Yes  SHORT TERM GOALS: Target date: 07/05/2023   Will be compliant with appropriate progressive HEP  Baseline: Goal status: MET  2.  R knee AROM to be no more than 5* extension and 100* flexion  Baseline:  Goal status:MET  3.  Will be able to ambulate household distances with SPC, good gait mechanics and no increase in pain  Baseline:  Goal status: MET  4.  Will be independent in edema management with regular icing and elevation Baseline:  Goal status: MET  LONG TERM GOALS: Target date: 07/26/2023   MMT to improve by one grade in all weak groups  Baseline:  Goal status: MET  2.  R knee flexion AROM to be at least 110* Baseline:  Goal status: Partially met  3.  Will be able to navigate at least 2 steps without rails and LRAD without difficulty  Baseline:  Goal status: MET  4.  Will tolerate kneeling tasks and will be able to come back up to standing without difficulty and no increase in pain R knee to allow her to return to yardwork and gardening tasks  Baseline:  Goal status: On going  5.  FOTO score to be within 5 points of predicted to show improved subjective status  Baseline:  Goal status: MET  PLAN:  PT FREQUENCY:  3x/week for 3 weeks, then 2x/week  for the next 3 weeks   PT DURATION: 6 weeks  PLANNED INTERVENTIONS: Therapeutic exercises, Therapeutic activity, Neuromuscular re-education, Balance training, Gait training, Patient/Family education, Self Care, Joint mobilization, Stair training, DME instructions, Dry Needling, Electrical stimulation, Cryotherapy, Moist heat, Taping, Ultrasound, Ionotophoresis 4mg /ml Dexamethasone, Manual therapy, and Re-evaluation  PLAN FOR NEXT SESSION: R knee ROM, functional strength, gait training, edema control IFC if needed  Rexene Agent, PTA

## 2023-08-25 ENCOUNTER — Ambulatory Visit: Payer: PPO | Admitting: Physical Therapy

## 2023-08-25 DIAGNOSIS — M25661 Stiffness of right knee, not elsewhere classified: Secondary | ICD-10-CM

## 2023-08-25 DIAGNOSIS — M25561 Pain in right knee: Secondary | ICD-10-CM | POA: Diagnosis not present

## 2023-08-25 DIAGNOSIS — R262 Difficulty in walking, not elsewhere classified: Secondary | ICD-10-CM

## 2023-08-25 DIAGNOSIS — R600 Localized edema: Secondary | ICD-10-CM

## 2023-08-25 NOTE — Therapy (Signed)
OUTPATIENT PHYSICAL THERAPY LOWER EXTREMITY TREATMENT   Patient Name: [name] Collier MRN: [id number] DOB:Apr 16, 1946, 77 y.o., female Today's Date: 08/25/2023  END OF SESSION:  PT End of Session - 08/25/23 1028     Visit Number 18    Number of Visits 19    Date for PT Re-Evaluation 08/30/23    PT Start Time 1015    PT Stop Time 1059    PT Time Calculation (min) 44 min    Activity Tolerance Patient tolerated treatment well    Behavior During Therapy [facility] for tasks assessed/performed             Past Medical History:  Diagnosis Date   Anxiety    Atrial fibrillation (HCC)    Cancer (HCC) 11/2020   left breast IMC   GERD (gastroesophageal reflux disease)    HTN (hypertension)    Hyperlipidemia    Personal history of radiation therapy    TIA (transient ischemic attack)    Past Surgical History:  Procedure Laterality Date   BOTOX INJECTION N/A 12/22/2018   Procedure: BOTOX INJECTION INTO ANAL SPHINCTER;  Surgeon: Andria Meuse, MD;  Location: [facility];  Service: General;  Laterality: N/A;   BREAST BIOPSY Left 11/26/2020   BREAST LUMPECTOMY WITH RADIOACTIVE SEED AND SENTINEL LYMPH NODE BIOPSY Left 01/07/2021   Procedure: LEFT BREAST LUMPECTOMY WITH RADIOACTIVE SEED AND LEFT AXILLARY SENTINEL LYMPH NODE BIOPSY WITH BLUE DYE INJECTION;  Surgeon: Manus Rudd, MD;  Location: [facility];  Service: General;  Laterality: Left;  LMA WITH PECTORAL BLOCK, BLUE DYE INJECTION   CARDIOVERSION N/A 01/04/2018   Procedure: CARDIOVERSION;  Surgeon: Lars Masson, MD;  Location: [facility] END[facility]COPY;  Service: Cardiovascular;  Laterality: N/A;   CARDIOVERSION N/A 09/20/2018   Procedure: CARDIOVERSION;  Surgeon: Quintella Reichert, MD;  Location: [facility] ENDOSCOPY;  Service: Cardiovascular;  Laterality: N/A;   CARDIOVERSION N/A 10/27/2018   Procedure: CARDIOVERSION;  Surgeon: Jodelle Red, MD;  Location: [facility] ENDOSCOPY;  Service: Cardiovascular;  Laterality: N/A;   KNEE SURGERY  Bilateral    Torn miniscus   RECTAL EXAM UNDER ANESTHESIA N/A 12/22/2018   Procedure: ANORECTAL  EXAM UNDER ANESTHESIA;  Surgeon: Andria Meuse, MD;  Location: [facility];  Service: General;  Laterality: N/A;   RECTOCELE REPAIR     TONSILLECTOMY     VAGINAL HYSTERECTOMY     Patient Active Problem List   Diagnosis Date Noted   Hypercoagulable state due to persistent atrial fibrillation (HCC) 04/13/2023   Adenomatous polyp of colon 04/30/2022   Esophageal reflux 04/30/2022   Family history of malignant neoplasm of digestive organs 04/30/2022   History of cellulitis 04/28/2022   Encounter for monitoring dofetilide therapy 07/04/2021   Spleen injury 06/29/2021   Carcinoma of upper-inner quadrant of left breast in female, estrogen receptor positive (HCC) 12/20/2020   Trigger thumb of left hand 06/28/2019   Depression, major, single episode, mild (HCC) 03/14/2019   Generalized anxiety disorder 03/14/2019   PAF (paroxysmal atrial fibrillation) (HCC)    Osteoarthritis of knee 04/20/2018   Post-menopausal 02/22/2018   Snoring 01/12/2018   Hypokalemia 01/12/2018   Encounter for cardioversion procedure    Persistent atrial fibrillation 11/11/2017   SOB (shortness of breath) 11/11/2017   Other fatigue 11/11/2017   Hyperlipemia 04/16/2017   Weakness of left hand 05/11/2016   Anxiety related tremor 05/11/2016   Essential hypertension    Normochromic normocytic anemia    TIA (transient ischemic attack) 02/26/2016    PCP: Delynn Flavin  DO   REFERRING PROVIDER: Doristine Counter, MD  REFERRING DIAG: M17.11 (ICD-10-CM) - Unilateral primary osteoarthritis, right knee  THERAPY DIAG:  Acute pain of right knee  Localized edema  Stiffness of right knee, not elsewhere classified  Difficulty in walking, not elsewhere classified  Rationale for Evaluation and Treatment: Rehabilitation  ONSET DATE: June 21st 2024  SUBJECTIVE:   SUBJECTIVE STATEMENT: Doing good.  PERTINENT  HISTORY: Anxiety, Afib, CA, HTN, HLD, TIA, breast lumpectomy with radioactive seed and sentinel lymph node biopsy, knee surgery, cardioversion  PAIN:  Are you having pain? No  PRECAUTIONS: None  WEIGHT BEARING RESTRICTIONS: No  FALLS:  Has patient fallen in last 6 months? No  LIVING ENVIRONMENT: Lives with: lives alone Lives in: House/apartment Stairs: 2 STE to get to porch, then one more step to enter  Has following equipment at home: Single point cane and Environmental consultant - 2 wheeled  OCCUPATION: retired   PLOF: Independent, Independent with basic ADLs, Independent with gait, and Independent with transfers  PATIENT GOALS: get back to normal thing, be able to move better in general, be able to walk more smoothly   NEXT MD VISIT: Dr. Autumn Messing 7/23  OBJECTIVE:   DIAGNOSTIC FINDINGS:   PATIENT SURVEYS:  FOTO 43.3  COGNITION: Overall cognitive status: Within functional limits for tasks assessed     SENSATION: Not tested  EDEMA:  Appropriate for post-op state   No signs of redness/drainage/infection at incision, steri strips still in place    LOWER EXTREMITY ROM:  Active ROM Right eval Right AROM 07/15/23  Hip flexion    Hip extension    Hip abduction    Hip adduction    Hip internal rotation    Hip external rotation    Knee flexion 72* supine heel slide  106  Knee extension 8* supine with heel prop 4  Ankle dorsiflexion    Ankle plantarflexion    Ankle inversion    Ankle eversion     (Blank rows = not tested)  LOWER EXTREMITY MMT:  MMT Right eval Left eval  Hip flexion 3 3  Hip extension    Hip abduction    Hip adduction    Hip internal rotation    Hip external rotation    Knee flexion 3- 4+  Knee extension 3 4+  Ankle dorsiflexion 5 5  Ankle plantarflexion    Ankle inversion    Ankle eversion     (Blank rows = not tested)     TODAY'S TREATMENT:                                                                                                                               DATE:  08/25/23:                                     EXERCISE LOG  Exercise Repetitions and Resistance  Comments  Nustep Level 4 x 18 minutes moving seat forward x 2 to increase flexion   Knee ext 10# x 3 minutes   Ham curls 40# x 3 minutes   Rockerboard 3 minutes        STW/M x 11 minutes to patient's right distal hamstring region.  08-18-23   EXERCISE LOG  Exercise Repetitions and Resistance Comments  Nustep L4 x 16 minutes.   Rockerboard 5 mins   Cybex Knee Flexion 30# x 3 mins   Cybex Knee Extension 10# x 3 mins   Cybex Leg Press 1 plate; seat 5 x 3 mins   Stairs Up/Down    Standing Marches    LAQs    Seated Ham Curls    Seated Hip Abduction     Manual Therapy Soft Tissue Mobilization: right distal hamstring, STW/M to right distal hamstring to decrease pain and tone  Modalities Date:  Vaso: Knee, 34 degrees, low pressure, 15 mins, Pain and Edema   PATIENT EDUCATION:  Education details: Supine HSS Person educated: Patient Education method: Explanation, Demonstration, and Handouts Education comprehension: verbalized understanding and returned demonstration  HOME EXERCISE PROGRAM: HOME EXERCISE PROGRAM Created by [place] Sherwood Castilla Jul 29th, 2024 View at [email or web address] using code: [email or web address]  Page 1 of 1 1 Exercise HAMSTRING STRETCH - SUPINE While lying on the ground, hold the back of your knee/thigh area and straighten your knee until a stretch is felt along the back of your leg. Repeat 3 Times Hold 1 Minute Complete 1 Set Perform 3 Times a Day  ASSESSMENT:  CLINICAL IMPRESSION: Pt arrives for today's treatment session denying any pain, but does report very minimal discomfort.  Pt introduced to cybex knee flexion, extension, and leg press today with pt requiring min cues for proper technique, to perform full ROM, and to eccentric control with all cybex exercises.  STW/M performed to right distal hamstring to decrease pain and tone with pt  in left side-lying and pillow between her knees for comfort.  Normal responses to vaso noted upon removal.  Pt denied any pain at completion of today's treatment session.   OBJECTIVE IMPAIRMENTS: Abnormal gait, decreased activity tolerance, decreased balance, decreased knowledge of use of DME, decreased mobility, difficulty walking, decreased ROM, decreased strength, increased edema, increased fascial restrictions, obesity, and pain.   ACTIVITY LIMITATIONS: sitting, standing, squatting, stairs, transfers, bed mobility, and locomotion level  PARTICIPATION LIMITATIONS: cleaning, laundry, driving, shopping, community activity, yard work, and church  PERSONAL FACTORS: Age, Behavior pattern, Education, Fitness, Past/current experiences, Social background, and Time since onset of injury/illness/exacerbation are also affecting patient's functional outcome.   REHAB POTENTIAL: Good  CLINICAL DECISION MAKING: Stable/uncomplicated  EVALUATION COMPLEXITY: Low   GOALS: Goals reviewed with patient? Yes  SHORT TERM GOALS: Target date: 07/05/2023   Will be compliant with appropriate progressive HEP  Baseline: Goal status: MET  2.  R knee AROM to be no more than 5* extension and 100* flexion  Baseline:  Goal status:MET  3.  Will be able to ambulate household distances with SPC, good gait mechanics and no increase in pain  Baseline:  Goal status: MET  4.  Will be independent in edema management with regular icing and elevation Baseline:  Goal status: MET  LONG TERM GOALS: Target date: 07/26/2023   MMT to improve by one grade in all weak groups  Baseline:  Goal status: MET  2.  R knee flexion AROM to be at least 110* Baseline:  Goal status: Partially met  3.  Will be able to navigate at least 2 steps without rails and LRAD without difficulty  Baseline:  Goal status: MET  4.  Will tolerate kneeling tasks and will be able to come back up to standing without difficulty and no increase in  pain R knee to allow her to return to yardwork and gardening tasks  Baseline:  Goal status: On going  5.  FOTO score to be within 5 points of predicted to show improved subjective status  Baseline:  Goal status: MET  PLAN:  PT FREQUENCY:  3x/week for 3 weeks, then 2x/week for the next 3 weeks   PT DURATION: 6 weeks  PLANNED INTERVENTIONS: Therapeutic exercises, Therapeutic activity, Neuromuscular re-education, Balance training, Gait training, Patient/Family education, Self Care, Joint mobilization, Stair training, DME instructions, Dry Needling, Electrical stimulation, Cryotherapy, Moist heat, Taping, Ultrasound, Ionotophoresis 4mg /ml Dexamethasone, Manual therapy, and Re-evaluation  PLAN FOR NEXT SESSION: R knee ROM, functional strength, gait training, edema control IFC if needed  Rexene Agent, PTA

## 2023-09-01 ENCOUNTER — Ambulatory Visit: Payer: PPO

## 2023-09-01 DIAGNOSIS — M25561 Pain in right knee: Secondary | ICD-10-CM

## 2023-09-01 DIAGNOSIS — R600 Localized edema: Secondary | ICD-10-CM

## 2023-09-01 DIAGNOSIS — R262 Difficulty in walking, not elsewhere classified: Secondary | ICD-10-CM

## 2023-09-01 DIAGNOSIS — M25661 Stiffness of right knee, not elsewhere classified: Secondary | ICD-10-CM

## 2023-09-01 NOTE — Therapy (Addendum)
OUTPATIENT PHYSICAL THERAPY LOWER EXTREMITY TREATMENT   Patient Name: [name] Collier MRN: [id number] DOB:09/27/1946, 77 y.o., female Today's Date: 09/01/2023  END OF SESSION:  PT End of Session - 09/01/23 1025     Visit Number 19    Number of Visits 19    Date for PT Re-Evaluation 08/30/23    PT Start Time 1015    Activity Tolerance Patient tolerated treatment well    Behavior During Therapy [facility] for tasks assessed/performed             Past Medical History:  Diagnosis Date   Anxiety    Atrial fibrillation (HCC)    Cancer (HCC) 11/2020   left breast IMC   GERD (gastroesophageal reflux disease)    HTN (hypertension)    Hyperlipidemia    Personal history of radiation therapy    TIA (transient ischemic attack)    Past Surgical History:  Procedure Laterality Date   BOTOX INJECTION N/A 12/22/2018   Procedure: BOTOX INJECTION INTO ANAL SPHINCTER;  Surgeon: Andria Meuse, MD;  Location: [facility];  Service: General;  Laterality: N/A;   BREAST BIOPSY Left 11/26/2020   BREAST LUMPECTOMY WITH RADIOACTIVE SEED AND SENTINEL LYMPH NODE BIOPSY Left 01/07/2021   Procedure: LEFT BREAST LUMPECTOMY WITH RADIOACTIVE SEED AND LEFT AXILLARY SENTINEL LYMPH NODE BIOPSY WITH BLUE DYE INJECTION;  Surgeon: Manus Rudd, MD;  Location: [facility];  Service: General;  Laterality: Left;  LMA WITH PECTORAL BLOCK, BLUE DYE INJECTION   CARDIOVERSION N/A 01/04/2018   Procedure: CARDIOVERSION;  Surgeon: Lars Masson, MD;  Location: [facility] ENDOSCOPY;  Service: Cardiovascular;  Laterality: N/A;   CARDIOVERSION N/A 09/20/2018   Procedure: CARDIOVERSION;  Surgeon: Quintella Reichert, MD;  Location: [facility] ENDOSCOPY;  Service: Cardiovascular;  Laterality: N/A;   CARDIOVERSION N/A 10/27/2018   Procedure: CARDIOVERSION;  Surgeon: Jodelle Red, MD;  Location: [facility] ENDOSCOPY;  Service: Cardiovascular;  Laterality: N/A;   KNEE SURGERY Bilateral    Torn miniscus   RECTAL EXAM UNDER ANESTHESIA  N/A 12/22/2018   Procedure: ANORECTAL  EXAM UNDER ANESTHESIA;  Surgeon: Andria Meuse, MD;  Location: [facility];  Service: General;  Laterality: N/A;   RECTOCELE REPAIR     TONSILLECTOMY     VAGINAL HYSTERECTOMY     Patient Active Problem List   Diagnosis Date Noted   Hypercoagulable state due to persistent atrial fibrillation (HCC) 04/13/2023   Adenomatous polyp of colon 04/30/2022   Esophageal reflux 04/30/2022   Family history of malignant neoplasm of digestive organs 04/30/2022   History of cellulitis 04/28/2022   Encounter for monitoring dofetilide therapy 07/04/2021   Spleen injury 06/29/2021   Carcinoma of upper-inner quadrant of left breast in female, estrogen receptor positive (HCC) 12/20/2020   Trigger thumb of left hand 06/28/2019   Depression, major, single episode, mild (HCC) 03/14/2019   Generalized anxiety disorder 03/14/2019   PAF (paroxysmal atrial fibrillation) (HCC)    Osteoarthritis of knee 04/20/2018   Post-menopausal 02/22/2018   Snoring 01/12/2018   Hypokalemia 01/12/2018   Encounter for cardioversion procedure    Persistent atrial fibrillation 11/11/2017   SOB (shortness of breath) 11/11/2017   Other fatigue 11/11/2017   Hyperlipemia 04/16/2017   Weakness of left hand 05/11/2016   Anxiety related tremor 05/11/2016   Essential hypertension    Normochromic normocytic anemia    TIA (transient ischemic attack) 02/26/2016    PCP: Delynn Flavin DO   REFERRING PROVIDER: Doristine Counter, MD  REFERRING DIAG: M17.11 (ICD-10-CM) - Unilateral primary  osteoarthritis, right knee  THERAPY DIAG:  Acute pain of right knee  Localized edema  Stiffness of right knee, not elsewhere classified  Difficulty in walking, not elsewhere classified  Rationale for Evaluation and Treatment: Rehabilitation  ONSET DATE: June 21st 2024  SUBJECTIVE:   SUBJECTIVE STATEMENT: Pt reports right distal IT band pain and tone.  PERTINENT HISTORY: Anxiety, Afib, CA, HTN,  HLD, TIA, breast lumpectomy with radioactive seed and sentinel lymph node biopsy, knee surgery, cardioversion  PAIN:  Are you having pain? Yes: NPRS scale: 2/10 Pain location: right knee  PRECAUTIONS: None  WEIGHT BEARING RESTRICTIONS: No  FALLS:  Has patient fallen in last 6 months? No  LIVING ENVIRONMENT: Lives with: lives alone Lives in: House/apartment Stairs: 2 STE to get to porch, then one more step to enter  Has following equipment at home: Single point cane and Environmental consultant - 2 wheeled  OCCUPATION: retired   PLOF: Independent, Independent with basic ADLs, Independent with gait, and Independent with transfers  PATIENT GOALS: get back to normal thing, be able to move better in general, be able to walk more smoothly   NEXT MD VISIT: Dr. Autumn Messing 7/23  OBJECTIVE:   DIAGNOSTIC FINDINGS:   PATIENT SURVEYS:  FOTO 43.3  COGNITION: Overall cognitive status: Within functional limits for tasks assessed     SENSATION: Not tested  EDEMA:  Appropriate for post-op state   No signs of redness/drainage/infection at incision, steri strips still in place    LOWER EXTREMITY ROM:  Active ROM Right eval Right AROM 07/15/23  Hip flexion    Hip extension    Hip abduction    Hip adduction    Hip internal rotation    Hip external rotation    Knee flexion 72* supine heel slide  106  Knee extension 8* supine with heel prop 4  Ankle dorsiflexion    Ankle plantarflexion    Ankle inversion    Ankle eversion     (Blank rows = not tested)  LOWER EXTREMITY MMT:  MMT Right eval Left eval  Hip flexion 3 3  Hip extension    Hip abduction    Hip adduction    Hip internal rotation    Hip external rotation    Knee flexion 3- 4+  Knee extension 3 4+  Ankle dorsiflexion 5 5  Ankle plantarflexion    Ankle inversion    Ankle eversion     (Blank rows = not tested)     TODAY'S TREATMENT:                                                                                                                               DATE:09/01/23:                                     EXERCISE LOG  Exercise Repetitions and Resistance Comments  Nustep Level  4 x 20 mins   Knee ext 10# x 3.5 minutes   Ham curls 40# x 3.5 minutes   Rockerboard 4 minutes        STW/M to patient's right distal hamstring region.  08-18-23   EXERCISE LOG  Exercise Repetitions and Resistance Comments  Nustep L4 x 16 minutes.   Rockerboard 5 mins   Cybex Knee Flexion 30# x 3 mins   Cybex Knee Extension 10# x 3 mins   Cybex Leg Press 1 plate; seat 5 x 3 mins   Stairs Up/Down    Standing Marches    LAQs    Seated Ham Curls    Seated Hip Abduction     Manual Therapy Soft Tissue Mobilization: right distal hamstring, STW/M to right distal hamstring to decrease pain and tone  Modalities Date:  Vaso: Knee, 34 degrees, low pressure, 15 mins, Pain and Edema   PATIENT EDUCATION:  Education details: Supine HSS Person educated: Patient Education method: Explanation, Demonstration, and Handouts Education comprehension: verbalized understanding and returned demonstration  HOME EXERCISE PROGRAM: HOME EXERCISE PROGRAM Created by [place] Applegate Jul 29th, 2024 View at [email or web address] using code: [email or web address]  Page 1 of 1 1 Exercise HAMSTRING STRETCH - SUPINE While lying on the ground, hold the back of your knee/thigh area and straighten your knee until a stretch is felt along the back of your leg. Repeat 3 Times Hold 1 Minute Complete 1 Set Perform 3 Times a Day  ASSESSMENT:  CLINICAL IMPRESSION: Pt arrives for today's treatment session reporting 2/10 distal right hamstring pain today.  Pt able to demonstrate 115 degrees of active right knee flexion today, meeting her long term goal.  Pt able to tolerate increased time with all exercises performed today without issue or complaint of pain.  Pt has met all of the goals set forth by physical therapy at this time.  Pt encouraged to call the facility with  any questions or concerns.  Pt denied any pain at completion of today's treatment session.  Pt ready for discharge at this time.  OBJECTIVE IMPAIRMENTS: Abnormal gait, decreased activity tolerance, decreased balance, decreased knowledge of use of DME, decreased mobility, difficulty walking, decreased ROM, decreased strength, increased edema, increased fascial restrictions, obesity, and pain.   ACTIVITY LIMITATIONS: sitting, standing, squatting, stairs, transfers, bed mobility, and locomotion level  PARTICIPATION LIMITATIONS: cleaning, laundry, driving, shopping, community activity, yard work, and church  PERSONAL FACTORS: Age, Behavior pattern, Education, Fitness, Past/current experiences, Social background, and Time since onset of injury/illness/exacerbation are also affecting patient's functional outcome.   REHAB POTENTIAL: Good  CLINICAL DECISION MAKING: Stable/uncomplicated  EVALUATION COMPLEXITY: Low   GOALS: Goals reviewed with patient? Yes  SHORT TERM GOALS: Target date: 07/05/2023   Will be compliant with appropriate progressive HEP  Baseline: Goal status: MET  2.  R knee AROM to be no more than 5* extension and 100* flexion  Baseline:  Goal status:MET  3.  Will be able to ambulate household distances with SPC, good gait mechanics and no increase in pain  Baseline:  Goal status: MET  4.  Will be independent in edema management with regular icing and elevation Baseline:  Goal status: MET  LONG TERM GOALS: Target date: 07/26/2023   MMT to improve by one grade in all weak groups  Baseline:  Goal status: MET  2.  R knee flexion AROM to be at least 110* Baseline:  Goal status: MET. 3.  Will be able to navigate at least 2 steps without rails  and LRAD without difficulty  Baseline:  Goal status: MET  4.  Will tolerate kneeling tasks and will be able to come back up to standing without difficulty and no increase in pain R knee to allow her to return to yardwork and  gardening tasks  Baseline:  Goal status:  5.  FOTO score to be within 5 points of predicted to show improved subjective status  Baseline:  Goal status: MET  PLAN:  PT FREQUENCY:  3x/week for 3 weeks, then 2x/week for the next 3 weeks   PT DURATION: 6 weeks  PLANNED INTERVENTIONS: Therapeutic exercises, Therapeutic activity, Neuromuscular re-education, Balance training, Gait training, Patient/Family education, Self Care, Joint mobilization, Stair training, DME instructions, Dry Needling, Electrical stimulation, Cryotherapy, Moist heat, Taping, Ultrasound, Ionotophoresis 4mg /ml Dexamethasone, Manual therapy, and Re-evaluation  PLAN FOR NEXT SESSION: R knee ROM, functional strength, gait training, edema control IFC if needed  Rexene Agent, PTA

## 2023-09-22 ENCOUNTER — Encounter: Payer: Self-pay | Admitting: Family Medicine

## 2023-09-22 ENCOUNTER — Ambulatory Visit (INDEPENDENT_AMBULATORY_CARE_PROVIDER_SITE_OTHER): Payer: PPO | Admitting: Family Medicine

## 2023-09-22 VITALS — BP 139/80 | HR 62 | Temp 98.5°F | Ht 64.0 in | Wt 236.0 lb

## 2023-09-22 DIAGNOSIS — I480 Paroxysmal atrial fibrillation: Secondary | ICD-10-CM

## 2023-09-22 DIAGNOSIS — Z79899 Other long term (current) drug therapy: Secondary | ICD-10-CM

## 2023-09-22 DIAGNOSIS — E785 Hyperlipidemia, unspecified: Secondary | ICD-10-CM | POA: Diagnosis not present

## 2023-09-22 DIAGNOSIS — R600 Localized edema: Secondary | ICD-10-CM

## 2023-09-22 DIAGNOSIS — I10 Essential (primary) hypertension: Secondary | ICD-10-CM | POA: Diagnosis not present

## 2023-09-22 DIAGNOSIS — F411 Generalized anxiety disorder: Secondary | ICD-10-CM

## 2023-09-22 DIAGNOSIS — Z96651 Presence of right artificial knee joint: Secondary | ICD-10-CM | POA: Diagnosis not present

## 2023-09-22 DIAGNOSIS — F410 Panic disorder [episodic paroxysmal anxiety] without agoraphobia: Secondary | ICD-10-CM | POA: Diagnosis not present

## 2023-09-22 MED ORDER — FUROSEMIDE 20 MG PO TABS: 20.0000 mg | ORAL_TABLET | Freq: Every day | ORAL | 3 refills | Status: DC

## 2023-09-22 MED ORDER — EZETIMIBE 10 MG PO TABS: 10.0000 mg | ORAL_TABLET | Freq: Every day | ORAL | 3 refills | Status: DC

## 2023-09-22 MED ORDER — LORAZEPAM 0.5 MG PO TABS
0.2500 mg | ORAL_TABLET | Freq: Every day | ORAL | 2 refills | Status: DC | PRN
Start: 2023-10-11 — End: 2023-12-29

## 2023-09-22 MED ORDER — OLMESARTAN MEDOXOMIL 5 MG PO TABS: 5.0000 mg | ORAL_TABLET | Freq: Every day | ORAL | 3 refills | Status: DC

## 2023-09-22 MED ORDER — FAMOTIDINE 20 MG PO TABS: 20.0000 mg | ORAL_TABLET | Freq: Two times a day (BID) | ORAL | 3 refills | Status: DC

## 2023-09-22 MED ORDER — POTASSIUM CHLORIDE CRYS ER 20 MEQ PO TBCR: 20.0000 meq | EXTENDED_RELEASE_TABLET | Freq: Two times a day (BID) | ORAL | 3 refills | Status: DC
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
## 2023-09-24 LAB — TOXASSURE SELECT 13 (MW), URINE

## 2023-09-30 ENCOUNTER — Ambulatory Visit: Payer: PPO | Admitting: Nurse Practitioner

## 2023-09-30 ENCOUNTER — Encounter: Payer: Self-pay | Admitting: Nurse Practitioner

## 2023-09-30 VITALS — BP 125/62 | HR 74 | Temp 100.0°F | Ht 64.0 in | Wt 231.8 lb

## 2023-09-30 DIAGNOSIS — U071 COVID-19: Secondary | ICD-10-CM | POA: Insufficient documentation

## 2023-09-30 MED ORDER — NIRMATRELVIR/RITONAVIR (PAXLOVID)TABLET
3.0000 | ORAL_TABLET | Freq: Two times a day (BID) | ORAL | 0 refills | Status: DC
Start: 2023-09-30 — End: 2023-10-01

## 2023-09-30 NOTE — Progress Notes (Signed)
Acute Office Visit  Subjective:     Patient ID: [name] Collier, female    DOB: Apr 09, 1946, 77 y.o.   MRN: [id number]  Chief Complaint  Patient presents with   Covid Positive    Took at home test and it was positive   Cough    Cough started Tuesday     HPI [name] Collier is a 77 y.o. female who complains of dry cough, headache, fevers chills. Took a COVID home test this morning and it was positive. [facility] + COVID test on her phone, none was administered at the office. She has a temp of 101 at the office. She has hx of Afib currently on Eliquis BID, since it f or a short course we will decrease Eliquis. 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   Generalized anxiety disorder 03/14/2019   Osteoarthritis of knee 04/20/2018   Post-menopausal 02/22/2018   Carcinoma of upper-inner quadrant of left breast in female, estrogen receptor positive (HCC) 12/20/2020   Trigger thumb of left hand 06/28/2019   Spleen injury 06/29/2021   Encounter for monitoring dofetilide therapy 07/04/2021   History of cellulitis 04/28/2022   Adenomatous polyp of colon 04/30/2022   Esophageal reflux 04/30/2022   Family history of malignant neoplasm of digestive organs 04/30/2022   Hypercoagulable state due to persistent atrial fibrillation (HCC) 04/13/2023   Positive self-administered antigen test for COVID-19 09/30/2023   Resolved Ambulatory Problems     Diagnosis Date Noted   No Resolved Ambulatory Problems   Past Medical History:  Diagnosis Date   Anxiety    Atrial fibrillation (HCC)    Cancer (HCC) 11/2020   GERD (gastroesophageal reflux disease)    HTN (hypertension)    Hyperlipidemia    Personal history of radiation therapy      Review of Systems  Constitutional:  Negative for chills and fever.  HENT:  Positive for congestion. Negative for sore throat.   Cardiovascular:  Negative for chest pain and leg swelling.  Gastrointestinal:  Negative for abdominal pain, diarrhea, nausea and vomiting.  Musculoskeletal:  Negative for falls and myalgias.  Skin:  Negative for itching and rash.  Neurological:  Positive for headaches.   Negative unless indicated in HPI    Objective:    BP 125/62   Pulse 74   Temp 100 F (37.8 C) (Temporal)   Ht 5\' 4"  (1.626 m)   Wt 231 lb 12.8 oz (105.1 kg)   SpO2 92%   BMI 39.79 kg/m  BP Readings from Last 3 Encounters:  09/30/23 125/62  09/22/23 139/80  07/20/23 (!) 194/73   Wt Readings from Last 3 Encounters:  09/30/23 231 lb 12.8 oz (105.1 kg)  09/22/23 236 lb (107 kg)  07/20/23 242 lb 6.4 oz (110 kg)      Physical Exam Vitals and nursing note reviewed.  Constitutional:      Appearance: Normal appearance.  HENT:     Head: Normocephalic  and atraumatic.     Nose: Congestion present.  Eyes:     General: No scleral icterus.    Extraocular Movements: Extraocular movements intact.     Conjunctiva/sclera: Conjunctivae normal.     Pupils: Pupils are equal, round, and reactive to light.  Cardiovascular:     Rate and Rhythm: Normal rate and regular rhythm.  Pulmonary:     Effort: Pulmonary effort is normal.     Breath sounds: Normal breath sounds.  Skin:    General: Skin is warm and dry.     Findings: No rash.  Neurological:     Mental Status: She is alert and oriented to person, place, and time.  Psychiatric:        Mood and Affect: Mood normal.        Behavior: Behavior  normal.        Thought Content: Thought content normal.        Judgment: Judgment normal.     No results found for any visits on 09/30/23.      Assessment & Plan:  Positive self-administered antigen test for COVID-19 -     nirmatrelvir/ritonavir; Take 3 tablets by mouth 2 (two) times daily for 5 days. Patient GFR is 90 from 05/29/23. Take nirmatrelvir (150 mg) two tablets twice daily for 5 days and ritonavir (100 mg) one tablet twice daily for 5 days.  Dispense: 30 tablet; Refill: 0   Meria is 77 yrs old female, seen today fro + homes COVID test, no acute distress Star Paxlovid, follow instruction on the box, rest, hydration Tylenol fro fever   The above assessment and management plan was discussed with the patient. The patient verbalized understanding of and has agreed to the management plan. Patient is aware to call the clinic if they develop any new symptoms or if symptoms persist or worsen. Patient is aware when to return to the clinic for a follow-up visit. Patient educated on when it is appropriate to go to the emergency department.  Return if symptoms worsen or fail to improve.  Martina Sinner, DNP [facility] [address] [place], [place] [address] [phone]         Person Under Monitoring Name: YACINE GORZYNSKI  Location: [address] [place] [address]   Infection Prevention Recommendations for Individuals Confirmed to have, or Being Evaluated for, 2019 Novel Coronavirus (COVID-19) Infection Who Receive Care at Home  Individuals who are confirmed to have, or are being evaluated for, COVID-19 should follow the prevention steps below until a healthcare provider or local or state health department says they can return to normal activities.  Stay home except to get medical care You should restrict activities outside your home, except for getting medical care. Do not go to work, school, or public areas, and do not  use public transportation or taxis.  Call ahead before visiting your doctor Before your medical appointment, call the healthcare provider and tell them that you have, or are being evaluated for, COVID-19 infection. This will help the healthcare provider's office take steps to keep other people from getting infected. Ask your healthcare provider to call the local or state health department.  Monitor your symptoms Seek prompt medical attention if your illness is worsening (e.g., difficulty breathing). Before going to your medical appointment, call the healthcare provider and tell them that you have, or are being evaluated for, COVID-19 infection. Ask your healthcare provider to call the local or state health department.  Wear a facemask You should wear a facemask that covers your nose and mouth when you are in the same room with other people and when you visit a healthcare provider. People who live with or visit you should also wear a facemask while they are in the same room with you.  Separate yourself from other people in your home As much as possible, you should stay in a different room from other people in your home. Also, you should use a separate bathroom, if available.  Avoid sharing household items You should not share dishes, drinking glasses, cups, eating utensils, towels, bedding, or other items with other people in your home. After using these items, you should wash them thoroughly with soap and water.  Cover your coughs and sneezes Cover your mouth and nose with a tissue when you cough or sneeze, or you can cough or sneeze into your sleeve. Throw used tissues in a lined trash can, and immediately wash your hands with soap and water for at least 20 seconds or use an alcohol-based hand rub.  Wash your [employer] your hands often and thoroughly with soap and water for at least 20 seconds. You can use an alcohol-based hand sanitizer if soap and water are not available and if  your hands are not visibly dirty. Avoid touching your eyes, nose, and mouth with unwashed hands.   Prevention Steps for Caregivers and Household Members of Individuals Confirmed to have, or Being Evaluated for, COVID-19 Infection Being Cared for in the Home  If you live with, or provide care at home for, a person confirmed to have, or being evaluated for, COVID-19 infection please follow these guidelines to prevent infection:  Follow healthcare provider's instructions Make sure that you understand and can help the patient follow any healthcare provider instructions for all care.  Provide for the patient's basic needs You should help the patient with basic needs in the home and provide support for getting groceries, prescriptions, and other personal needs.  Monitor the patient's symptoms If they are getting sicker, call his or her medical provider and tell them that the patient has, or is being evaluated for, COVID-19 infection. This will help the healthcare provider's office take steps to keep other people from getting infected. Ask the healthcare provider to call the local or state health department.  Limit the number of people who have contact with the patient If possible, have only one caregiver for the patient. Other household members should stay in another home or place of residence. If this is not possible, they should stay in another room, or be separated from the patient as much as possible. Use a separate bathroom, if available. Restrict visitors who do not have an essential need to be in the home.  Keep older adults, very young children, and other sick people away from the patient Keep older adults, very young children, and those who have compromised immune systems or chronic health conditions away from the patient. This includes people with chronic heart, lung, or kidney conditions, diabetes, and cancer.  Ensure good ventilation Make sure that shared spaces in the home have  good air flow, such as from an air conditioner or an opened window, weather permitting.  Wash your hands often Wash your hands often and thoroughly with soap and water for at least 20 seconds. You can use an alcohol based hand sanitizer if soap and water are not available and if your hands are not visibly dirty. Avoid touching your eyes, nose, and  mouth with unwashed hands. Use disposable paper towels to dry your hands. If not available, use dedicated cloth towels and replace them when they become wet.  Wear a facemask and gloves Wear a disposable facemask at all times in the room and gloves when you touch or have contact with the patient's blood, body fluids, and/or secretions or excretions, such as sweat, saliva, sputum, nasal mucus, vomit, urine, or feces.  Ensure the mask fits over your nose and mouth tightly, and do not touch it during use. Throw out disposable facemasks and gloves after using them. Do not reuse. Wash your hands immediately after removing your facemask and gloves. If your personal clothing becomes contaminated, carefully remove clothing and launder. Wash your hands after handling contaminated clothing. Place all used disposable facemasks, gloves, and other waste in a lined container before disposing them with other household waste. Remove gloves and wash your hands immediately after handling these items.  Do not share dishes, glasses, or other household items with the patient Avoid sharing household items. You should not share dishes, drinking glasses, cups, eating utensils, towels, bedding, or other items with a patient who is confirmed to have, or being evaluated for, COVID-19 infection. After the person uses these items, you should wash them thoroughly with soap and water.  Wash laundry thoroughly Immediately remove and wash clothes or bedding that have blood, body fluids, and/or secretions or excretions, such as sweat, saliva, sputum, nasal mucus, vomit, urine, or  feces, on them. Wear gloves when handling laundry from the patient. Read and follow directions on labels of laundry or clothing items and detergent. In general, wash and dry with the warmest temperatures recommended on the label.  Clean all areas the individual has used often Clean all touchable surfaces, such as counters, tabletops, doorknobs, bathroom fixtures, toilets, phones, keyboards, tablets, and bedside tables, every day. Also, clean any surfaces that may have blood, body fluids, and/or secretions or excretions on them. Wear gloves when cleaning surfaces the patient has come in contact with. Use a diluted bleach solution (e.g., dilute bleach with 1 part bleach and 10 parts water) or a household disinfectant with a label that says EPA-registered for coronaviruses. To make a bleach solution at home, add 1 tablespoon of bleach to 1 quart (4 cups) of water. For a larger supply, add  cup of bleach to 1 gallon (16 cups) of water. Read labels of cleaning products and follow recommendations provided on product labels. Labels contain instructions for safe and effective use of the cleaning product including precautions you should take when applying the product, such as wearing gloves or eye protection and making sure you have good ventilation during use of the product. Remove gloves and wash hands immediately after cleaning.  Monitor yourself for signs and symptoms of illness Caregivers and household members are considered close contacts, should monitor their health, and will be asked to limit movement outside of the home to the extent possible. Follow the monitoring steps for close contacts listed on the symptom monitoring form.   ? If you have additional questions, contact your local health department or call the epidemiologist on call at [phone] (available 24/7). ? This guidance is subject to change. For the most up-to-date guidance from [facility], please refer to their  website: [email or web address]

## 2023-10-01 ENCOUNTER — Encounter: Payer: Self-pay | Admitting: Family Medicine

## 2023-10-01 DIAGNOSIS — U071 COVID-19: Secondary | ICD-10-CM

## 2023-10-01 MED ORDER — FLUTICASONE PROPIONATE 50 MCG/ACT NA SUSP
2.0000 | Freq: Every day | NASAL | 6 refills | Status: DC
Start: 2023-10-01 — End: 2023-11-11

## 2023-10-01 MED ORDER — DEXAMETHASONE 2 MG PO TABS
2.0000 mg | ORAL_TABLET | Freq: Every day | ORAL | 0 refills | Status: AC
Start: 2023-10-01 — End: 2023-10-04

## 2023-10-01 MED ORDER — PROMETHAZINE-DM 6.25-15 MG/5ML PO SYRP
2.5000 mL | ORAL_SOLUTION | Freq: Four times a day (QID) | ORAL | 0 refills | Status: DC | PRN
Start: 2023-10-01 — End: 2023-11-11

## 2023-10-01 MED ORDER — BENZONATATE 100 MG PO CAPS
100.0000 mg | ORAL_CAPSULE | Freq: Three times a day (TID) | ORAL | 0 refills | Status: DC | PRN
Start: 2023-10-01 — End: 2023-11-11

## 2023-10-01 MED ORDER — MOLNUPIRAVIR EUA 200MG CAPSULE
4.0000 | ORAL_CAPSULE | Freq: Two times a day (BID) | ORAL | 0 refills | Status: AC
Start: 2023-10-01 — End: 2023-10-06

## 2023-10-01 NOTE — Addendum Note (Signed)
Addended by: Raliegh Ip on: 10/01/2023 11:50 AM   Modules accepted: Orders

## 2023-10-13 ENCOUNTER — Encounter: Payer: Self-pay | Admitting: Family Medicine

## 2023-10-14 ENCOUNTER — Other Ambulatory Visit (INDEPENDENT_AMBULATORY_CARE_PROVIDER_SITE_OTHER): Payer: PPO

## 2023-10-14 ENCOUNTER — Encounter: Payer: Self-pay | Admitting: Family

## 2023-10-14 ENCOUNTER — Ambulatory Visit (INDEPENDENT_AMBULATORY_CARE_PROVIDER_SITE_OTHER): Payer: PPO | Admitting: Family

## 2023-10-14 VITALS — BP 140/70 | HR 76 | Temp 97.8°F | Ht 64.0 in | Wt 232.0 lb

## 2023-10-14 DIAGNOSIS — J209 Acute bronchitis, unspecified: Secondary | ICD-10-CM | POA: Diagnosis not present

## 2023-10-14 DIAGNOSIS — Z8616 Personal history of COVID-19: Secondary | ICD-10-CM

## 2023-10-14 DIAGNOSIS — R059 Cough, unspecified: Secondary | ICD-10-CM | POA: Diagnosis not present

## 2023-10-14 MED ORDER — DOXYCYCLINE HYCLATE 100 MG PO TABS
100.0000 mg | ORAL_TABLET | Freq: Two times a day (BID) | ORAL | 0 refills | Status: DC
Start: 2023-10-14 — End: 2023-11-11

## 2023-10-14 MED ORDER — METHYLPREDNISOLONE 4 MG PO TBPK
ORAL_TABLET | ORAL | 0 refills | Status: DC
Start: 2023-10-14 — End: 2023-11-11

## 2023-10-14 NOTE — Patient Instructions (Signed)

## 2023-10-14 NOTE — Progress Notes (Signed)
Subjective:    Patient ID: [name] Collier, female    DOB: November 05, 1946, 77 y.o.   MRN: [id number]  Chief Complaint  Patient presents with   Cough    + covid test 2 weeks ago    PT presents to the office today with a cough started two weeks ago. She was positive for COVID 09/30/23. Feeling much better, but continues to have a cough.  Cough This is a new problem. The current episode started 1 to 4 weeks ago. The problem has been gradually improving. The problem occurs every few minutes. The cough is Productive of sputum. Associated symptoms include shortness of breath (when coughing) and wheezing. Pertinent negatives include no chills, ear congestion, ear pain, fever, headaches, myalgias or nasal congestion. She has tried rest and OTC cough suppressant for the symptoms. The treatment provided mild relief. There is no history of asthma or COPD.      Review of Systems  Constitutional:  Negative for chills and fever.  HENT:  Negative for ear pain.   Respiratory:  Positive for cough, shortness of breath (when coughing) and wheezing.   Musculoskeletal:  Negative for myalgias.  Neurological:  Negative for headaches.  All other systems reviewed and are negative.      Objective:   Physical Exam Vitals reviewed.  Constitutional:      General: She is not in acute distress.    Appearance: She is well-developed. She is obese.  HENT:     Head: Normocephalic and atraumatic.     Right Ear: Tympanic membrane normal.     Left Ear: Tympanic membrane normal.  Eyes:     Pupils: Pupils are equal, round, and reactive to light.  Neck:     Thyroid: No thyromegaly.  Cardiovascular:     Rate and Rhythm: Normal rate and regular rhythm.     Heart sounds: Normal heart sounds. No murmur heard. Pulmonary:     Effort: Pulmonary effort is normal. No respiratory distress.     Breath sounds: Rhonchi present. No wheezing.  Abdominal:     General: Bowel sounds are normal. There is no distension.      Palpations: Abdomen is soft.     Tenderness: There is no abdominal tenderness.  Musculoskeletal:        General: No tenderness. Normal range of motion.     Cervical back: Normal range of motion and neck supple.  Skin:    General: Skin is warm and dry.  Neurological:     Mental Status: She is alert and oriented to person, place, and time.     Cranial Nerves: No cranial nerve deficit.     Deep Tendon Reflexes: Reflexes are normal and symmetric.  Psychiatric:        Behavior: Behavior normal.        Thought Content: Thought content normal.        Judgment: Judgment normal.     BP (!) 140/70   Pulse 76   Temp 97.8 F (36.6 C) (Temporal)   Ht 5\' 4"  (1.626 m)   Wt 232 lb (105.2 kg)   SpO2 96%   BMI 39.82 kg/m        Assessment & Plan:   TIARE ROHLMAN comes in today with chief complaint of Cough (+ covid test 2 weeks ago )   Diagnosis and orders addressed:  1. Acute bronchitis, unspecified organism -Start doxycyline and medrol dose pack Unable to tolerate prednisone, discussed she might not be able to tolerate  medrol either - Take meds as prescribed - Use a cool mist humidifier  -Use saline nose sprays frequently -Force fluids -For any cough or congestion  Use plain Mucinex- regular strength or max strength is fine -For fever or aces or pains- take tylenol or ibuprofen. -Throat lozenges if help -New toothbrush in 3 days Follow up if symptoms worsen or do not improve  - doxycycline (VIBRA-TABS) 100 MG tablet; Take 1 tablet (100 mg total) by mouth 2 (two) times daily.  Dispense: 20 tablet; Refill: 0 - methylPREDNISolone (MEDROL DOSEPAK) 4 MG TBPK tablet; Use directed  Dispense: 21 tablet; Refill: 0 - DG Chest 2 View  2. History of COVID-19 - DG Chest 2 View  [place], [place]

## 2023-10-20 ENCOUNTER — Telehealth: Payer: Self-pay | Admitting: Family Medicine

## 2023-10-20 NOTE — Telephone Encounter (Signed)
No result yet 

## 2023-10-20 NOTE — Telephone Encounter (Signed)
Copied from CRM [id number]. Topic: Clinical - Lab/Test Results >> Oct 20, 2023  9:45 AM Maxwell Marion wrote: Reason for CRM: Pt is calling about chest xray results from November 7th. Says she hasn't heard anything and would like a call back.

## 2023-10-26 ENCOUNTER — Encounter: Payer: Self-pay | Admitting: Family Medicine

## 2023-11-11 ENCOUNTER — Ambulatory Visit (HOSPITAL_COMMUNITY)
Admission: RE | Admit: 2023-11-11 | Discharge: 2023-11-11 | Disposition: A | Discharge status: Home / Self Care | Payer: PPO | Source: Ambulatory Visit | Attending: Internal Medicine | Admitting: Internal Medicine

## 2023-11-11 VITALS — BP 154/66 | HR 61 | Ht 64.0 in | Wt 232.2 lb

## 2023-11-11 DIAGNOSIS — Z7901 Long term (current) use of anticoagulants: Secondary | ICD-10-CM | POA: Insufficient documentation

## 2023-11-11 DIAGNOSIS — Z79899 Other long term (current) drug therapy: Secondary | ICD-10-CM | POA: Diagnosis not present

## 2023-11-11 DIAGNOSIS — Z01818 Encounter for other preprocedural examination: Secondary | ICD-10-CM | POA: Diagnosis not present

## 2023-11-11 DIAGNOSIS — Z8673 Personal history of transient ischemic attack (TIA), and cerebral infarction without residual deficits: Secondary | ICD-10-CM | POA: Diagnosis not present

## 2023-11-11 DIAGNOSIS — Z5181 Encounter for therapeutic drug level monitoring: Secondary | ICD-10-CM

## 2023-11-11 DIAGNOSIS — I480 Paroxysmal atrial fibrillation: Secondary | ICD-10-CM

## 2023-11-11 DIAGNOSIS — I10 Essential (primary) hypertension: Secondary | ICD-10-CM | POA: Insufficient documentation

## 2023-11-11 DIAGNOSIS — I4819 Other persistent atrial fibrillation: Secondary | ICD-10-CM | POA: Insufficient documentation

## 2023-11-11 LAB — BASIC METABOLIC PANEL
Anion gap: 8 (ref 5–15)
BUN: 15 mg/dL (ref 8–23)
CO2: 26 mmol/L (ref 22–32)
Calcium: 9.5 mg/dL (ref 8.9–10.3)
Chloride: 106 mmol/L (ref 98–111)
Creatinine, Ser: 0.68 mg/dL (ref 0.44–1.00)
GFR, Estimated: >60 mL/min (ref >60)
Glucose, Bld: 101 mg/dL — ABNORMAL HIGH (ref 70–99)
Potassium: 4.8 mmol/L (ref 3.5–5.1)
Sodium: 140 mmol/L (ref 135–145)

## 2023-11-11 LAB — MAGNESIUM: Magnesium: 2.3 mg/dL (ref 1.7–2.4)

## 2023-11-11 NOTE — Progress Notes (Signed)
Primary Care Physician: Raliegh Ip, DO Referring Physician: Dr. Mayford Knife Cardiologist: Dr. Antoine Poche EP: Dr. Gar Gibbon is a 77 y.o. female with a h/o afib, HTN, hyperlipidemia and TIA. She is in the afib clinic for f/u of Tikosyn use. She feels well with NSR today. She did have a mechanical fall a couple of weeks ago and was placed on metoprolol 25 mg bid for afib secondary to the fall. She did convert in a couple of days and then stopped BB with issues with hypotension and bradycardia. Now however, her BP has been running higher, around 150-160 systolic.  Continues on xarelto 20 mg daily with a CHA2DS2VASc of at least 5, without any signs of bleeding.   F/u with afib clinic 06/19/21. She is staying in SR with Tikosyn. She has had an lumpectomy and radiation. Is finished with treatment now and is on daily. Arimidex. She is pending a colonoscopy and has received instruction re holding her DOAC. Reminded to take Tikosyn with a sip of water.    F/u in afib clinic, 07/22/21,  after a hemo peritoneal bleed around her spleen following a colonoscopy. She had minimal cbc drop and was observed x 2 days in the hospital. It did resolve without any intervention. She was switched form xarelto to eliquis on d/c.   F/u in afib clinic, 12/30/21 for ongoing  Tikosyn use. She is continuing  in SR. No issues with anticoagulation. States that she has has had some recent stomach upset with diarrhea. Will check labs today.   F/u 06/30/22 for tikosyn surveillance. Ekg shows SR. No afib to report. Has had some issues with L more so than Rt LE cellulitis. Has recently had her 3rd round of antibiotics. Started with an injury to LL leg. Recently fitted with support hose by PCP.   F/u in afib clinic,12/29/22 for Tikosyn surveillance. She is in SR with stable qt. She had an upper respiratory infection in December but is now resolved. No complaints voiced today.   F/u in afib clinic, 04/12/23. She is currently  in SR. She feels like she has been back in Afib for the past week. She feels tired and short of breath and sometimes feels her heart flipping. She has not checked her heart rhythm at home.   F/u in Afib clinic, 05/12/23. She is currently in NSR. She wore cardiac monitor which showed 8% PVCs and no Afib. Since last office visit, she feels her heart flipping sometimes but perhaps not as frequent as prior. No missed doses of Tikosyn or anticoagulant.  F/u in Afib clinic, 11/11/23. She is currently in NSR. Patient is here for Tikosyn surveillance. She has had no episodes of Afib since last office visit. She did have Covid last month and felt like her heart was "flip flopping" briefly but never went into Afib. No missed doses of Tikosyn or Eliquis.   Today, she denies symptoms of palpitations, chest pain, shortness of breath, orthopnea, PND, lower extremity edema, dizziness, presyncope, syncope, or neurologic sequela. The patient is tolerating medications without difficulties and is otherwise without complaint today.   Past Medical History:  Diagnosis Date   Anxiety    Atrial fibrillation (HCC)    Cancer (HCC) 11/2020   left breast IMC   GERD (gastroesophageal reflux disease)    HTN (hypertension)    Hyperlipidemia    Personal history of radiation therapy    TIA (transient ischemic attack)    Past Surgical History:  Procedure Laterality  Date   BOTOX INJECTION N/A 12/22/2018   Procedure: BOTOX INJECTION INTO ANAL SPHINCTER;  Surgeon: Andria Meuse, MD;  Location: [facility];  Service: General;  Laterality: N/A;   BREAST BIOPSY Left 11/26/2020   BREAST LUMPECTOMY WITH RADIOACTIVE SEED AND SENTINEL LYMPH NODE BIOPSY Left 01/07/2021   Procedure: LEFT BREAST LUMPECTOMY WITH RADIOACTIVE SEED AND LEFT AXILLARY SENTINEL LYMPH NODE BIOPSY WITH BLUE DYE INJECTION;  Surgeon: Manus Rudd, MD;  Location: [facility];  Service: General;  Laterality: Left;  LMA WITH PECTORAL BLOCK, BLUE DYE  INJECTION   CARDIOVERSION N/A 01/04/2018   Procedure: CARDIOVERSION;  Surgeon: Lars Masson, MD;  Location: [facility] ENDOSCOPY;  Service: Cardiovascular;  Laterality: N/A;   CARDIOVERSION N/A 09/20/2018   Procedure: CARDIOVERSION;  Surgeon: Quintella Reichert, MD;  Location: [facility] ENDOSCOPY;  Service: Cardiovascular;  Laterality: N/A;   CARDIOVERSION N/A 10/27/2018   Procedure: CARDIOVERSION;  Surgeon: Jodelle Red, MD;  Location: [facility] ENDOSCOPY;  Service: Cardiovascular;  Laterality: N/A;   KNEE SURGERY Bilateral    Torn miniscus   RECTAL EXAM UNDER ANESTHESIA N/A 12/22/2018   Procedure: ANORECTAL  EXAM UNDER ANESTHESIA;  Surgeon: Andria Meuse, MD;  Location: [facility];  Service: General;  Laterality: N/A;   RECTOCELE REPAIR     TONSILLECTOMY     VAGINAL HYSTERECTOMY      Current Outpatient Medications  Medication Sig Dispense Refill   acetaminophen (TYLENOL) 500 MG tablet Take 1,000 mg by mouth as needed for moderate pain (pain score 4-6) or headache.     amLODipine (NORVASC) 5 MG tablet Take 0.5 tablets (2.5 mg total) by mouth daily.     anastrozole (ARIMIDEX) 1 MG tablet Take 1 tablet by mouth daily 90 tablet 2   apixaban (ELIQUIS) 5 MG TABS tablet Take 1 tablet (5 mg total) by mouth 2 (two) times daily. 180 tablet 2   Cholecalciferol (VITAMIN D3) 5000 units CAPS Take 5,000 Units by mouth daily with supper.     dofetilide (TIKOSYN) 500 MCG capsule TAKE 1 CAPSULE BY MOUTH TWICE A DAY 180 capsule 2   ezetimibe (ZETIA) 10 MG tablet Take 1 tablet (10 mg total) by mouth daily. 90 tablet 3   famotidine (PEPCID) 20 MG tablet Take 1 tablet (20 mg total) by mouth 2 (two) times daily. 180 tablet 3   furosemide (LASIX) 20 MG tablet Take 1 tablet (20 mg total) by mouth daily. 90 tablet 3   LORazepam (ATIVAN) 0.5 MG tablet Take 0.5-1 tablets (0.25-0.5 mg total) by mouth daily as needed for anxiety. 20 tablet 2   olmesartan (BENICAR) 5 MG tablet Take 1 tablet (5 mg total) by mouth daily. 90  tablet 3   potassium chloride SA (KLOR-CON M) 20 MEQ tablet Take 1 tablet (20 mEq total) by mouth 2 (two) times daily. 180 tablet 3   No current facility-administered medications for this encounter.    Allergies  Allergen Reactions   Augmentin [Amoxicillin-Pot Clavulanate] Other (See Comments)    c-diff Has patient had a PCN reaction causing immediate rash, facial/tongue/throat swelling, SOB or lightheadedness with hypotension: No Has patient had a PCN reaction causing severe rash involving mucus membranes or skin necrosis: No Has patient had a PCN reaction that required hospitalization: No Has patient had a PCN reaction occurring within the last 10 years: Yes If all of the above answers are "NO", then may proceed with Cephalosporin use.    Codeine Palpitations and Rash   Prednisone Other (See Comments)  Jittery, red in the face   Celecoxib Other (See Comments)   ROS- All systems are reviewed and negative except as per the HPI above  Physical Exam: Vitals:   11/11/23 1016  BP: (!) 154/66  Pulse: 61  Weight: 105.3 kg  Height: 5\' 4"  (1.626 m)     Wt Readings from Last 3 Encounters:  11/11/23 105.3 kg  10/14/23 105.2 kg  09/30/23 105.1 kg    Labs: Lab Results  Component Value Date   NA 139 05/12/2023   K 4.3 05/12/2023   CL 107 05/12/2023   CO2 24 05/12/2023   GLUCOSE 106 (H) 05/12/2023   BUN 18 05/12/2023   CREATININE 0.91 05/12/2023   CALCIUM 9.7 05/12/2023   PHOS 4.1 06/30/2021   MG 2.0 05/12/2023   Lab Results  Component Value Date   INR 1.7 (H) 06/29/2021   Lab Results  Component Value Date   CHOL 164 05/24/2023   HDL 56 05/24/2023   LDLCALC 87 05/24/2023   TRIG 118 05/24/2023   GEN- The patient is well appearing, alert and oriented x 3 today.   Neck - no JVD or carotid bruit noted Lungs- Clear to ausculation bilaterally, normal work of breathing Heart- Regular rate and rhythm, no murmurs, rubs or gallops, PMI not laterally  displaced Extremities- no clubbing, cyanosis, or edema Skin - no rash or ecchymosis noted   EKG - Vent. rate 61 BPM PR interval 208 ms QRS duration 70 ms QT/QTcB 420/422 ms P-R-T axes 79 -28 39 Normal sinus rhythm Low voltage QRS Cannot rule out Anterior infarct , age undetermined Abnormal ECG When compared with ECG of 12-May-2023 10:13, PREVIOUS ECG IS PRESENT  ECHO 11/17/2017: - Left ventricle: The cavity size was normal. There was mild    concentric hypertrophy. Systolic function was normal. The    estimated ejection fraction was in the range of 60% to 65%. Wall    motion was normal; there were no regional wall motion    abnormalities.  - Aortic valve: Transvalvular velocity was within the normal range.    There was no stenosis. There was no regurgitation.  - Aorta: Ascending aortic diameter: 38 mm (S).  - Ascending aorta: The ascending aorta was mildly dilated.  - Mitral valve: Mildly calcified annulus. Transvalvular velocity    was within the normal range. There was no evidence for stenosis.    There was mild regurgitation.  - Left atrium: The atrium was mildly dilated.  - Right ventricle: The cavity size was normal. Wall thickness was    normal. Systolic function was normal.  - Atrial septum: No defect or patent foramen ovale was identified.  - Tricuspid valve: There was mild-moderate regurgitation.  - Pulmonic valve: There was moderate regurgitation.  - Pulmonary arteries: Systolic pressure was severely increased. PA    peak pressure: 69 mm Hg (S).   Cardiac monitor 5/6-20/24:  Patch Wear Time:  13 days and 19 hours    Predominant rhythm was sinus rhythm 2 SVT episodes, longest and fastest 1 minute 17 seconds at 130 bpm No atrial fibrillation Less than 1% supraventricular ectopy 8% ventricular ectopy Patient triggered episodes associated with both sinus rhythm and sinus rhythm with PVCs   Assessment and Plan: 1. Persistent afib - on Tikosyn since 10/2018.    She is in SR today.   Qtc stable. Bmet and mag drawn today. Continue Tikosyn 500 mcg BID.   Continue eliquis 5 mg bid for CHA2DS2VASc of at least 5.  2. HTN Elevated here but stable at home   F/u with afib clinic in 6 months for Tikosyn surveillance.  Lake Bells, PA-C Af[facility] [address] [place], [place] [address] [phone]

## 2023-12-20 ENCOUNTER — Other Ambulatory Visit: Payer: Self-pay | Admitting: Surgery

## 2023-12-20 DIAGNOSIS — C50212 Malignant neoplasm of upper-inner quadrant of left female breast: Secondary | ICD-10-CM

## 2023-12-20 DIAGNOSIS — C50911 Malignant neoplasm of unspecified site of right female breast: Secondary | ICD-10-CM | POA: Diagnosis not present

## 2023-12-24 ENCOUNTER — Other Ambulatory Visit (HOSPITAL_COMMUNITY): Payer: Self-pay | Admitting: *Deleted

## 2023-12-24 MED ORDER — APIXABAN 5 MG PO TABS: 5.0000 mg | ORAL_TABLET | Freq: Two times a day (BID) | ORAL | 2 refills | Status: DC

## 2023-12-29 ENCOUNTER — Encounter: Payer: Self-pay | Admitting: Family Medicine

## 2023-12-29 ENCOUNTER — Ambulatory Visit (INDEPENDENT_AMBULATORY_CARE_PROVIDER_SITE_OTHER): Payer: PPO | Admitting: Family Medicine

## 2023-12-29 VITALS — BP 148/72 | HR 60 | Temp 98.5°F | Ht 64.0 in | Wt 236.8 lb

## 2023-12-29 DIAGNOSIS — I10 Essential (primary) hypertension: Secondary | ICD-10-CM | POA: Diagnosis not present

## 2023-12-29 DIAGNOSIS — I480 Paroxysmal atrial fibrillation: Secondary | ICD-10-CM | POA: Diagnosis not present

## 2023-12-29 DIAGNOSIS — L259 Unspecified contact dermatitis, unspecified cause: Secondary | ICD-10-CM

## 2023-12-29 DIAGNOSIS — F410 Panic disorder [episodic paroxysmal anxiety] without agoraphobia: Secondary | ICD-10-CM | POA: Diagnosis not present

## 2023-12-29 DIAGNOSIS — F411 Generalized anxiety disorder: Secondary | ICD-10-CM | POA: Diagnosis not present

## 2023-12-29 DIAGNOSIS — B372 Candidiasis of skin and nail: Secondary | ICD-10-CM

## 2023-12-29 DIAGNOSIS — N3281 Overactive bladder: Secondary | ICD-10-CM | POA: Diagnosis not present

## 2023-12-29 LAB — BAYER DCA HB A1C WAIVED: HB A1C (BAYER DCA - WAIVED): 5.2 % (ref 4.8–5.6)

## 2023-12-29 MED ORDER — NYSTATIN 100000 UNIT/GM EX CREA: 1.0000 | TOPICAL_CREAM | Freq: Two times a day (BID) | CUTANEOUS | 2 refills | Status: AC

## 2023-12-29 MED ORDER — AMLODIPINE BESYLATE 2.5 MG PO TABS: 2.5000 mg | ORAL_TABLET | Freq: Every day | ORAL | 3 refills | Status: DC

## 2023-12-29 MED ORDER — LORAZEPAM 0.5 MG PO TABS: 0.2500 mg | ORAL_TABLET | Freq: Every day | ORAL | 2 refills | Status: DC | PRN

## 2023-12-29 MED ORDER — TRIAMCINOLONE ACETONIDE 0.1 % EX CREA: 1.0000 | TOPICAL_CREAM | Freq: Two times a day (BID) | CUTANEOUS | 0 refills | Status: DC

## 2023-12-29 NOTE — Progress Notes (Signed)
Subjective: CC: Chronic follow-up PCP: Raliegh Ip, DO [id number] [name] Collier is a 78 y.o. female presenting to clinic today for:  1.  Urinary frequency Patient has had issues with urinary frequency for some time now.  She has not been able to take any other medicines for overactive bladder due to chronic use of Arimidex.  She would like to get her blood sugar checked today  2.  Rash She reports an itchy dry rash on the left inner ear.  Not using anything for this at this time  Also has a rash on the left inguinal fold that seems to be recurrent.  She has some old tramadol-betamethasone cream and this seemed to help but it always comes back.  She wears nylon panties   ROS: Per HPI  Allergies  Allergen Reactions   Augmentin [Amoxicillin-Pot Clavulanate] Other (See Comments)    c-diff Has patient had a PCN reaction causing immediate rash, facial/tongue/throat swelling, SOB or lightheadedness with hypotension: No Has patient had a PCN reaction causing severe rash involving mucus membranes or skin necrosis: No Has patient had a PCN reaction that required hospitalization: No Has patient had a PCN reaction occurring within the last 10 years: Yes If all of the above answers are "NO", then may proceed with Cephalosporin use.    Codeine Palpitations and Rash   Prednisone Other (See Comments)    Jittery, red in the face   Celecoxib Other (See Comments)   Past Medical History:  Diagnosis Date   Anxiety    Atrial fibrillation (HCC)    Cancer (HCC) 11/2020   left breast IMC   GERD (gastroesophageal reflux disease)    HTN (hypertension)    Hyperlipidemia    Personal history of radiation therapy    TIA (transient ischemic attack)     Current Outpatient Medications:    acetaminophen (TYLENOL) 500 MG tablet, Take 1,000 mg by mouth as needed for moderate pain (pain score 4-6) or headache., Disp: , Rfl:    amLODipine (NORVASC) 5 MG tablet, Take 0.5 tablets (2.5 mg total) by mouth  daily., Disp: , Rfl:    anastrozole (ARIMIDEX) 1 MG tablet, Take 1 tablet by mouth daily, Disp: 90 tablet, Rfl: 2   apixaban (ELIQUIS) 5 MG TABS tablet, Take 1 tablet (5 mg total) by mouth 2 (two) times daily., Disp: 180 tablet, Rfl: 2   Cholecalciferol (VITAMIN D3) 5000 units CAPS, Take 5,000 Units by mouth daily with supper., Disp: , Rfl:    dofetilide (TIKOSYN) 500 MCG capsule, TAKE 1 CAPSULE BY MOUTH TWICE A DAY, Disp: 180 capsule, Rfl: 2   ezetimibe (ZETIA) 10 MG tablet, Take 1 tablet (10 mg total) by mouth daily., Disp: 90 tablet, Rfl: 3   famotidine (PEPCID) 20 MG tablet, Take 1 tablet (20 mg total) by mouth 2 (two) times daily., Disp: 180 tablet, Rfl: 3   furosemide (LASIX) 20 MG tablet, Take 1 tablet (20 mg total) by mouth daily., Disp: 90 tablet, Rfl: 3   LORazepam (ATIVAN) 0.5 MG tablet, Take 0.5-1 tablets (0.25-0.5 mg total) by mouth daily as needed for anxiety., Disp: 20 tablet, Rfl: 2   olmesartan (BENICAR) 5 MG tablet, Take 1 tablet (5 mg total) by mouth daily., Disp: 90 tablet, Rfl: 3   potassium chloride SA (KLOR-CON M) 20 MEQ tablet, Take 1 tablet (20 mEq total) by mouth 2 (two) times daily., Disp: 180 tablet, Rfl: 3 Social History   Socioeconomic History   Marital status: Widowed    Spouse  name: Not on file   Number of children: 2   Years of education: 21   Highest education level: 12th grade  Occupational History   Occupation: Retired    Comment: Geophysical data processor  Tobacco Use   Smoking status: Never   Smokeless tobacco: Never   Tobacco comments:    Never smoke 06/30/22  Vaping Use   Vaping status: Never Used  Substance and Sexual Activity   Alcohol use: No    Alcohol/week: 0.0 standard drinks of alcohol   Drug use: No   Sexual activity: Not Currently    Birth control/protection: Post-menopausal  Other Topics Concern   Not on file  Social History Narrative   Lives alone - children in [place] and [place] -    Caffeine use: Soda/tea daily   Good church  support group   Social Drivers of Health   Financial Resource Strain: Low Risk  (12/28/2023)   Overall Financial Resource Strain (CARDIA)    Difficulty of Paying Living Expenses: Not hard at all  Food Insecurity: No Food Insecurity (12/28/2023)   Hunger Vital Sign    Worried About Running Out of Food in the Last Year: Never true    Ran Out of Food in the Last Year: Never true  Transportation Needs: No Transportation Needs (12/28/2023)   PRAPARE - Administrator, Civil Service (Medical): No    Lack of Transportation (Non-Medical): No  Physical Activity: Inactive (12/28/2023)   Exercise Vital Sign    Days of Exercise per Week: 0 days    Minutes of Exercise per Session: 30 min  Stress: Stress Concern Present (12/28/2023)   [employer] of Occupational Health - Occupational Stress Questionnaire    Feeling of Stress : To some extent  Social Connections: Moderately Integrated (12/28/2023)   Social Connection and Isolation Panel [NHANES]    Frequency of Communication with Friends and Family: More than three times a week    Frequency of Social Gatherings with Friends and Family: Once a week    Attends Religious Services: More than 4 times per year    Active Member of [employer] or Organizations: Yes    Attends Banker Meetings: More than 4 times per year    Marital Status: Widowed  Intimate Partner Violence: Not At Risk (05/28/2023)   Received from [facility]   HITS    Over the last 12 months how often did your partner physically hurt you?: Never    Over the last 12 months how often did your partner insult you or talk down to you?: Never    Over the last 12 months how often did your partner threaten you with physical harm?: Never    Over the last 12 months how often did your partner scream or curse at you?: Never   Family History  Problem Relation Age of Onset   Hypertension Mother    Hyperlipidemia Mother    Neurologic Disorder Mother 23       GB    Stroke Son    Stroke Maternal Uncle    Stroke Grandchild    Prostate cancer Brother     Objective: Office vital signs reviewed. BP (!) 148/72   Pulse 60   Temp 98.5 F (36.9 C)   Ht 5\' 4"  (1.626 m)   Wt 236 lb 12.8 oz (107.4 kg)   SpO2 95%   BMI 40.65 kg/m   Physical Examination:  General: Awake, alert, well nourished, No acute distress HEENT: sclera  white, MMM.  Dry, flaky skin of the external auditory canal on the left present Cardio: regular rate and rhythm, S1S2 heard, no murmurs appreciated Pulm: clear to auscultation bilaterally, no wheezes, rhonchi or rales; normal work of breathing on room air    12/29/2023   11:00 AM 10/14/2023   12:14 PM 09/30/2023    2:04 PM  Depression screen PHQ 2/9  Decreased Interest 1 1 1   Down, Depressed, Hopeless 1 1 1   PHQ - 2 Score 2 2 2   Altered sleeping 1 1 1   Tired, decreased energy 0 1 1  Change in appetite 0 0 0  Feeling bad or failure about yourself  0 0 0  Trouble concentrating 0 1 1  Moving slowly or fidgety/restless 0 0 0  Suicidal thoughts 0 0 0  PHQ-9 Score 3 5 5   Difficult doing work/chores Not difficult at all Somewhat difficult Not difficult at all      12/29/2023   11:00 AM 09/30/2023    2:05 PM 09/22/2023   11:14 AM 07/06/2023    2:10 PM  GAD 7 : Generalized Anxiety Score  Nervous, Anxious, on Edge 1 1 1 1   Control/stop worrying 1 1 1 1   Worry too much - different things 0 1 0 1  Trouble relaxing 0 1 0 1  Restless 0 1 1 1   Easily annoyed or irritable 1 1 1 1   Afraid - awful might happen 1 1 0 1  Total GAD 7 Score 4 7 4 7   Anxiety Difficulty Not difficult at all Not difficult at all Somewhat difficult Somewhat difficult      Assessment/ Plan: 78 y.o. female   Candidal intertrigo - Plan: nystatin cream (MYCOSTATIN)  Contact dermatitis of external ear - Plan: triamcinolone cream (KENALOG) 0.1 %  Generalized anxiety disorder with panic attacks - Plan: LORazepam (ATIVAN) 0.5 MG tablet  Overactive  bladder - Plan: Bayer DCA Hb A1c Waived  PAF (paroxysmal atrial fibrillation) (HCC)  Essential hypertension - Plan: amLODipine (NORVASC) 2.5 MG tablet  Mycostatin prescribed for intertrigo of the left inguinal fold.  Discussed keeping the area dry and switching to cotton panties  Triamcinolone given for the external auditory canal dermatitis noted on exam.  Discussed sparing use of this  Anxiety disorder is chronic and stable.  Has only refilled one of the 3 prescriptions that was given last time.  I have placed a future order to put on file.  No apparent negative side effects of this medication at this time.  She continues to use sparingly as appropriate  Sadly, her use of Arimidex limits overactive bladder medications as we do not want to reduce the efficacy of her breast cancer medication.  Her A1c did not demonstrate diabetes today.  Atrial fibrillation with both rate and rhythm controlled today  Blood pressure controlled for age.  I have renewed her amlodipine.   Raliegh Ip, DO [facility] [phone]

## 2023-12-31 ENCOUNTER — Encounter: Payer: Self-pay | Admitting: Family Medicine

## 2024-01-06 ENCOUNTER — Ambulatory Visit
Admission: RE | Admit: 2024-01-06 | Discharge: 2024-01-06 | Disposition: A | Discharge status: Home / Self Care | Payer: PPO | Source: Ambulatory Visit | Attending: Surgery | Admitting: Surgery

## 2024-01-06 DIAGNOSIS — Z1231 Encounter for screening mammogram for malignant neoplasm of breast: Secondary | ICD-10-CM | POA: Diagnosis not present

## 2024-01-06 DIAGNOSIS — Z170 Estrogen receptor positive status [ER+]: Secondary | ICD-10-CM

## 2024-01-25 ENCOUNTER — Other Ambulatory Visit (HOSPITAL_COMMUNITY): Payer: Self-pay

## 2024-01-25 MED ORDER — APIXABAN 5 MG PO TABS: 5.0000 mg | ORAL_TABLET | Freq: Two times a day (BID) | ORAL | Status: DC

## 2024-02-08 ENCOUNTER — Other Ambulatory Visit (HOSPITAL_COMMUNITY): Payer: Self-pay | Admitting: *Deleted

## 2024-02-08 MED ORDER — DOFETILIDE 500 MCG PO CAPS: 500.0000 ug | ORAL_CAPSULE | Freq: Two times a day (BID) | ORAL | 2 refills | Status: DC

## 2024-02-22 ENCOUNTER — Other Ambulatory Visit: Payer: Self-pay | Admitting: *Deleted

## 2024-02-22 MED ORDER — ANASTROZOLE 1 MG PO TABS: 1.0000 mg | ORAL_TABLET | Freq: Every day | ORAL | 2 refills | Status: DC

## 2024-04-03 ENCOUNTER — Ambulatory Visit: Payer: PPO

## 2024-04-03 VITALS — BP 148/72 | HR 60 | Ht 64.0 in | Wt 236.0 lb

## 2024-04-03 DIAGNOSIS — Z0000 Encounter for general adult medical examination without abnormal findings: Secondary | ICD-10-CM

## 2024-04-03 NOTE — Progress Notes (Signed)
 Subjective:   [name] Collier is a 78 y.o. who presents for a Medicare Wellness preventive visit.  Visit Complete: Virtual I connected with  Shaena J Detamore on 04/03/24 by a audio enabled telemedicine application and verified that I am speaking with the correct person using two identifiers.  Patient Location: Home  Provider Location: Home Office  I discussed the limitations of evaluation and management by telemedicine. The patient expressed understanding and agreed to proceed.  Vital Signs: Because this visit was a virtual/telehealth visit, some criteria may be missing or patient reported. Any vitals not documented were not able to be obtained and vitals that have been documented are patient reported.  VideoDeclined- This patient declined Librarian, academic. Therefore the visit was completed with audio only.  Persons Participating in Visit: Patient.  AWV Questionnaire: No: Patient Medicare AWV questionnaire was not completed prior to this visit.  Cardiac Risk Factors include: advanced age (>38men, >4 women);obesity (BMI >30kg/m2);hypertension;dyslipidemia     Objective:    Today's Vitals   04/03/24 0840  BP: (!) 148/72  Pulse: 60  Weight: 236 lb (107 kg)  Height: 5\' 4"  (1.626 m)   Body mass index is 40.51 kg/m.     04/03/2024    8:51 AM 07/20/2023   11:10 AM 06/14/2023   10:21 AM 03/30/2023    1:19 PM 04/30/2022    1:29 PM 06/29/2021   11:07 AM 04/25/2021   12:57 PM  Advanced Directives  Does Patient Have a Medical Advance Directive? Yes No Yes Yes Yes No Yes  Type of Estate agent of [place];Living will  Healthcare Power of [place];Living will Healthcare Power of [place];Living will Healthcare Power of [place];Living will  Healthcare Power of [place];Living will  Does patient want to make changes to medical advance directive?   No - Patient declined      Copy of Healthcare Power of Attorney in Chart? No - copy requested   No - copy requested No - copy requested No - copy requested  No - copy requested  Would patient like information on creating a medical advance directive?  No - Patient declined    No - Patient declined     Current Medications (verified) Outpatient Encounter Medications as of 04/03/2024  Medication Sig   acetaminophen  (TYLENOL ) 500 MG tablet Take 1,000 mg by mouth as needed for moderate pain (pain score 4-6) or headache.   amLODipine  (NORVASC ) 2.5 MG tablet Take 1 tablet (2.5 mg total) by mouth daily. *Note dose change   anastrozole  (ARIMIDEX ) 1 MG tablet Take 1 tablet (1 mg total) by mouth daily.   apixaban  (ELIQUIS ) 5 MG TABS tablet Take 1 tablet (5 mg total) by mouth 2 (two) times daily.   Cholecalciferol  (VITAMIN D3) 5000 units CAPS Take 5,000 Units by mouth daily with supper.   dofetilide  (TIKOSYN ) 500 MCG capsule Take 1 capsule (500 mcg total) by mouth 2 (two) times daily.   ezetimibe  (ZETIA ) 10 MG tablet Take 1 tablet (10 mg total) by mouth daily.   famotidine  (PEPCID ) 20 MG tablet Take 1 tablet (20 mg total) by mouth 2 (two) times daily.   furosemide  (LASIX ) 20 MG tablet Take 1 tablet (20 mg total) by mouth daily.   LORazepam  (ATIVAN ) 0.5 MG tablet Take 0.5-1 tablets (0.25-0.5 mg total) by mouth daily as needed for anxiety (put on hold).   nystatin  cream (MYCOSTATIN ) Apply 1 Application topically 2 (two) times daily. X5-10 days as needed for rash in groin area  olmesartan  (BENICAR ) 5 MG tablet Take 1 tablet (5 mg total) by mouth daily.   potassium chloride  SA (KLOR-CON  M) 20 MEQ tablet Take 1 tablet (20 mEq total) by mouth 2 (two) times daily.   triamcinolone  cream (KENALOG ) 0.1 % Apply 1 Application topically 2 (two) times daily. With tip of finger X5 days as needed for ear dermatitis   No facility-administered encounter medications on file as of 04/03/2024.    Allergies (verified) Augmentin [amoxicillin-pot clavulanate], Codeine, Prednisone , and Celecoxib   History: Past Medical  History:  Diagnosis Date   Anxiety    Atrial fibrillation (HCC)    Cancer (HCC) 11/2020   left breast IMC   GERD (gastroesophageal reflux disease)    HTN (hypertension)    Hyperlipidemia    Personal history of radiation therapy    TIA (transient ischemic attack)    Past Surgical History:  Procedure Laterality Date   BOTOX  INJECTION N/A 12/22/2018   Procedure: BOTOX  INJECTION INTO ANAL SPHINCTER;  Surgeon: Melvenia Stabs, MD;  Location: [facility];  Service: General;  Laterality: N/A;   BREAST BIOPSY Left 11/26/2020   BREAST LUMPECTOMY WITH RADIOACTIVE SEED AND SENTINEL LYMPH NODE BIOPSY Left 01/07/2021   Procedure: LEFT BREAST LUMPECTOMY WITH RADIOACTIVE SEED AND LEFT AXILLARY SENTINEL LYMPH NODE BIOPSY WITH BLUE DYE INJECTION;  Surgeon: Dareen Ebbing, MD;  Location:  SURGERY CENTER;  Service: General;  Laterality: Left;  LMA WITH PECTORAL BLOCK, BLUE DYE INJECTION   CARDIOVERSION N/A 01/04/2018   Procedure: CARDIOVERSION;  Surgeon: Liza Riggers, MD;  Location[facility] ENDOSCOPY;  Service: Cardiovascular;  Laterality: N/A;   CARDIOVERSION N/A 09/20/2018   Procedure: CARDIOVERSION;  Surgeon: Jacqueline Matsu, MD;  Location: [facility] ENDOSCOPY;  Service: Cardiovascular;  Laterality: N/A;   CARDIOVERSION N/A 10/27/2018   Procedure: CARDIOVERSION;  Surgeon: Sheryle Donning, MD;  Location: [facility] ENDOSCOPY;  Service: Cardiovascular;  Laterality: N/A;   KNEE SURGERY Bilateral    Torn miniscus   RECTAL EXAM UNDER ANESTHESIA N/A 12/22/2018   Procedure: ANORECTAL  EXAM UNDER ANESTHESIA;  Surgeon: Melvenia Stabs, MD;  Location: [facility]S;  Service: General;  Laterality: N/A;   RECTOCELE REPAIR     TONSILLECTOMY     VAGINAL HYSTERECTOMY     Family History  Problem Relation Age of Onset   Hypertension Mother    Hyperlipidemia Mother    Neurologic Disorder Mother 39       GB   Stroke Son    Stroke Maternal Uncle    Stroke Grandchild    Prostate cancer Brother    Social History    Socioeconomic History   Marital status: Widowed    Spouse name: Not on file   Number of children: 2   Years of education: 57   Highest education level: 12th grade  Occupational History   Occupation: Retired    Comment: Geophysical data processor  Tobacco Use   Smoking status: Never   Smokeless tobacco: Never   Tobacco comments:    Never smoke 06/30/22  Vaping Use   Vaping status: Never Used  Substance and Sexual Activity   Alcohol use: No    Alcohol/week: 0.0 standard drinks of alcohol   Drug use: No   Sexual activity: Not Currently    Birth control/protection: Post-menopausal  Other Topics Concern   Not on file  Social History Narrative   Lives alone - children in [place] and [place] -    Caffeine use: Soda/tea daily   Good church support group   Social  Drivers of Health   Financial Resource Strain: Low Risk  (04/03/2024)   Overall Financial Resource Strain (CARDIA)    Difficulty of Paying Living Expenses: Not hard at all  Food Insecurity: No Food Insecurity (04/03/2024)   Hunger Vital Sign    Worried About Running Out of Food in the Last Year: Never true    Ran Out of Food in the Last Year: Never true  Transportation Needs: No Transportation Needs (04/03/2024)   PRAPARE - Administrator, Civil Service (Medical): No    Lack of Transportation (Non-Medical): No  Physical Activity: Insufficiently Active (04/03/2024)   Exercise Vital Sign    Days of Exercise per Week: 7 days    Minutes of Exercise per Session: 20 min  Stress: No Stress Concern Present (04/03/2024)   [employer] of Occupational Health - Occupational Stress Questionnaire    Feeling of Stress : Not at all  Social Connections: Moderately Isolated (04/03/2024)   Social Connection and Isolation Panel [NHANES]    Frequency of Communication with Friends and Family: More than three times a week    Frequency of Social Gatherings with Friends and Family: More than three times a week    Attends Religious  Services: More than 4 times per year    Active Member of [employer] or Organizations: No    Attends Banker Meetings: Never    Marital Status: Widowed    Tobacco Counseling Counseling given: Yes Tobacco comments: Never smoke 06/30/22    Clinical Intake:  Pre-visit preparation completed: Yes  Pain : No/denies pain     BMI - recorded: 40.51 Nutritional Status: BMI > 30  Obese Nutritional Risks: None Diabetes: No  Lab Results  Component Value Date   HGBA1C 5.2 12/29/2023   HGBA1C 5.6 05/24/2023   HGBA1C 5.6 04/24/2022     How often do you need to have someone help you when you read instructions, pamphlets, or other written materials from your doctor or pharmacy?: 1 - Never  Interpreter Needed?: No  Information entered by :: Anola Basques T/CMA   Activities of Daily Living     04/03/2024    8:47 AM  In your present state of health, do you have any difficulty performing the following activities:  Hearing? 0  Vision? 0  Difficulty concentrating or making decisions? 0  Walking or climbing stairs? 1  Comment yes for now due to knee pain (L) waiting for replacement/already have appt schedule per pt  Dressing or bathing? 0  Doing errands, shopping? 0  Preparing Food and eating ? N  Using the Toilet? N  In the past six months, have you accidently leaked urine? N  Do you have problems with loss of bowel control? N  Managing your Medications? N  Managing your Finances? N  Housekeeping or managing your Housekeeping? N    Patient Care Team: Eliodoro Guerin, DO as PCP - General (Family Medicine) Eilleen Grates, MD as PCP - Cardiology (Cardiology) Jolly Needle, MD (Inactive) as PCP - Electrophysiology (Cardiology) Cameron Cea, MD as Consulting Physician (Hematology and Oncology) Lanita Pitman, MD as Consulting Physician (Gastroenterology) Dareen Ebbing, MD as Consulting Physician (General Surgery) [facility] Colie Dawes, MD as  Attending Physician (Radiation Oncology) Delilah Fend, [facility] as Pharmacist (Family Medicine) Gaile Jourdain Larene Pleasant, LCSW as [facility] Care Management (Licensed Clinical Social Worker) Denman Fischer, MD (Dermatology)  Indicate any recent Medical Services you may have received from other than [facility] providers in the past  year (date may be approximate).     Assessment:   This is a routine wellness examination for [place].  Hearing/Vision screen Hearing Screening - Comments:: Pt denies hearing dif Vision Screening - Comments:: Pt denies vision dif Pt goes to Dr. Candi Chafe in [place]   Goals Addressed             This Visit's Progress    Achieve a Healthy Weight-Obesity   On track    Timeframe:  Long-Range Goal Priority:  High Start Date:                             Expected End Date:                       Follow Up Date 05/06/2023    - drink 6 to 8 glasses of water each day - eat 5 or 6 small meals each day - manage portion size - set goal weight    Why is this important?   When you are ready to manage your weight, have a plan and have set a goal, it is time to take action.  Taking small steps to change how you eat and exercise is a good place to start.    Notes:        Depression Screen     04/03/2024    8:44 AM 12/29/2023   11:00 AM 10/14/2023   12:14 PM 09/30/2023    2:04 PM 09/22/2023   11:14 AM 07/06/2023    2:10 PM 05/24/2023    9:21 AM  PHQ 2/9 Scores  PHQ - 2 Score 1 2 2 2 2 2 2   PHQ- 9 Score 2 3 5 5 3 6 6     Fall Risk     04/03/2024    8:43 AM 12/29/2023   11:00 AM 10/14/2023   12:14 PM 09/30/2023    2:05 PM 09/22/2023   10:57 AM  Fall Risk   Falls in the past year? 0 0 0 0 0  Number falls in past yr: 0 0 0 0 0  Injury with Fall? 0 0 0 0 0  Risk for fall due to : No Fall Risks No Fall Risks No Fall Risks No Fall Risks No Fall Risks  Follow up Falls prevention discussed;Falls evaluation completed Falls evaluation completed Falls evaluation  completed Falls evaluation completed Education provided    MEDICARE RISK AT HOME:  Medicare Risk at Home Any stairs in or around the home?: Yes If so, are there any without handrails?: No Home free of loose throw rugs in walkways, pet beds, electrical cords, etc?: Yes Adequate lighting in your home to reduce risk of falls?: Yes Life alert?: No Use of a cane, walker or w/c?: No Grab bars in the bathroom?: No Shower chair or bench in shower?: Yes Elevated toilet seat or a handicapped toilet?: Yes  TIMED UP AND GO:  Was the test performed?  no  Cognitive Function: 6CIT completed        04/03/2024    8:56 AM 03/30/2023    1:20 PM 04/30/2022    1:26 PM 04/24/2020    9:02 AM 04/24/2019    1:46 PM  6CIT Screen  What Year? 0 points 0 points 0 points 0 points 0 points  What month? 0 points 0 points 0 points 0 points 0 points  What time? 0 points 0 points 0 points 0 points 0 points  Count back from 20 0 points 0 points 0 points 2 points 0 points  Months in reverse 0 points 0 points 0 points 0 points 0 points  Repeat phrase 0 points 0 points 0 points 0 points 0 points  Total Score 0 points 0 points 0 points 2 points 0 points    Immunizations Immunization History  Administered Date(s) Administered   Moderna Sars-Covid-2 Vaccination 02/29/2020, 03/28/2020, 12/20/2020   Pneumococcal Conjugate-13 12/30/2016   Pneumococcal Polysaccharide-23 02/08/2018   Tdap 08/16/2001    Screening Tests Health Maintenance  Topic Date Due   Zoster Vaccines- Shingrix (1 of 2) Never done   Colonoscopy  06/26/2024   DTaP/Tdap/Td (2 - Td or Tdap) 09/21/2024 (Originally 08/17/2011)   COVID-19 Vaccine (4 - 2024-25 season) 04/19/2025 (Originally 08/08/2023)   INFLUENZA VACCINE  07/07/2024   MAMMOGRAM  01/05/2025   Medicare Annual Wellness (AWV)  04/03/2025   DEXA SCAN  05/23/2025   Pneumonia Vaccine 16+ Years old  Completed   Hepatitis C Screening  Completed   HPV VACCINES  Aged Out   Meningococcal B  Vaccine  Aged Out    Health Maintenance  Health Maintenance Due  Topic Date Due   Zoster Vaccines- Shingrix (1 of 2) Never done   Colonoscopy  06/26/2024   Health Maintenance Items Addressed: See Nurse Notes  Additional Screening:  Vision Screening: Recommended annual ophthalmology exams for early detection of glaucoma and other disorders of the eye.  Dental Screening: Recommended annual dental exams for proper oral hygiene  Community Resource Referral / Chronic Care Management: CRR required this visit?  no  CCM required this visit?  No     Plan:     I have personally reviewed and noted the following in the patient's chart:   Medical and social history Use of alcohol, tobacco or illicit drugs  Current medications and supplements including opioid prescriptions. Patient is not currently taking opioid prescriptions. Functional ability and status Nutritional status Physical activity Advanced directives List of other physicians Hospitalizations, surgeries, and ER visits in previous 12 months Vitals Screenings to include cognitive, depression, and falls Referrals and appointments  In addition, I have reviewed and discussed with patient certain preventive protocols, quality metrics, and best practice recommendations. A written personalized care plan for preventive services as well as general preventive health recommendations were provided to patient.     Michaelle Adolphus, CMA   04/03/2024   After Visit Summary: (MyChart) Due to this being a telephonic visit, the after visit summary with patients personalized plan was offered to patient via MyChart   Notes:  pt is encourage to get the following done at her next pcp visit: tetanus, shingles vaccine and colonoscopy order.

## 2024-04-03 NOTE — Patient Instructions (Signed)
 [name] Collier , Thank you for taking time to come for your Medicare Wellness Visit. I appreciate your ongoing commitment to your health goals. Please review the following plan we discussed and let me know if I can assist you in the future.   Referrals/Orders/Follow-Ups/Clinician Recommendations: Please remember to get the following pt is encourage to get the following done at her next visit: tetanus, shingles vaccine and colonoscopy order.  This is a list of the screening recommended for you and due dates:  Health Maintenance  Topic Date Due   Zoster (Shingles) Vaccine (1 of 2) Never done   Colon Cancer Screening  06/26/2024   DTaP/Tdap/Td vaccine (2 - Td or Tdap) 09/21/2024*   COVID-19 Vaccine (4 - 2024-25 season) 04/19/2025*   Flu Shot  07/07/2024   Mammogram  01/05/2025   Medicare Annual Wellness Visit  04/03/2025   DEXA scan (bone density measurement)  05/23/2025   Pneumonia Vaccine  Completed   Hepatitis C Screening  Completed   HPV Vaccine  Aged Out   Meningitis B Vaccine  Aged Out  *Topic was postponed. The date shown is not the original due date.    Advanced directives: (Copy Requested) Please bring a copy of your health care power of attorney and living will to the office to be added to your chart at your convenience. You can mail to [facility] [address] [place], [place] [address] or email to [email or web address]  Next Medicare Annual Wellness Visit scheduled for next year: Yes

## 2024-04-12 DIAGNOSIS — Z1283 Encounter for screening for malignant neoplasm of skin: Secondary | ICD-10-CM | POA: Diagnosis not present

## 2024-04-12 DIAGNOSIS — X32XXXD Exposure to sunlight, subsequent encounter: Secondary | ICD-10-CM | POA: Diagnosis not present

## 2024-04-12 DIAGNOSIS — D2272 Melanocytic nevi of left lower limb, including hip: Secondary | ICD-10-CM | POA: Diagnosis not present

## 2024-04-12 DIAGNOSIS — L570 Actinic keratosis: Secondary | ICD-10-CM | POA: Diagnosis not present

## 2024-04-12 DIAGNOSIS — D225 Melanocytic nevi of trunk: Secondary | ICD-10-CM | POA: Diagnosis not present

## 2024-04-12 DIAGNOSIS — L718 Other rosacea: Secondary | ICD-10-CM | POA: Diagnosis not present

## 2024-04-12 DIAGNOSIS — D485 Neoplasm of uncertain behavior of skin: Secondary | ICD-10-CM | POA: Diagnosis not present

## 2024-04-18 DIAGNOSIS — N3941 Urge incontinence: Secondary | ICD-10-CM | POA: Diagnosis not present

## 2024-04-18 DIAGNOSIS — N139 Obstructive and reflux uropathy, unspecified: Secondary | ICD-10-CM | POA: Diagnosis not present

## 2024-05-02 DIAGNOSIS — D485 Neoplasm of uncertain behavior of skin: Secondary | ICD-10-CM | POA: Diagnosis not present

## 2024-05-02 DIAGNOSIS — L570 Actinic keratosis: Secondary | ICD-10-CM | POA: Diagnosis not present

## 2024-05-02 DIAGNOSIS — L988 Other specified disorders of the skin and subcutaneous tissue: Secondary | ICD-10-CM | POA: Diagnosis not present

## 2024-05-02 DIAGNOSIS — X32XXXD Exposure to sunlight, subsequent encounter: Secondary | ICD-10-CM | POA: Diagnosis not present

## 2024-05-18 ENCOUNTER — Ambulatory Visit (HOSPITAL_COMMUNITY)
Admission: RE | Admit: 2024-05-18 | Discharge: 2024-05-18 | Disposition: A | Discharge status: Home / Self Care | Payer: PPO | Source: Ambulatory Visit | Attending: Internal Medicine | Admitting: Internal Medicine

## 2024-05-18 VITALS — BP 158/78 | HR 60 | Ht 64.0 in | Wt 244.8 lb

## 2024-05-18 DIAGNOSIS — Z79899 Other long term (current) drug therapy: Secondary | ICD-10-CM

## 2024-05-18 DIAGNOSIS — D6869 Other thrombophilia: Secondary | ICD-10-CM | POA: Diagnosis not present

## 2024-05-18 DIAGNOSIS — I4819 Other persistent atrial fibrillation: Secondary | ICD-10-CM | POA: Diagnosis not present

## 2024-05-18 DIAGNOSIS — Z5181 Encounter for therapeutic drug level monitoring: Secondary | ICD-10-CM | POA: Diagnosis not present

## 2024-05-18 DIAGNOSIS — I4891 Unspecified atrial fibrillation: Secondary | ICD-10-CM | POA: Diagnosis not present

## 2024-05-18 NOTE — Progress Notes (Addendum)
 Primary Care Physician: Eliodoro Guerin, DO Referring Physician: Dr. Micael Adas Cardiologist: Dr. Lavonne Prairie EP: Dr. Dennise Fitz is a 78 y.o. female with a h/o afib, HTN, hyperlipidemia and TIA. She is in the afib clinic for f/u of Tikosyn  use. She feels well with NSR today. She did have a mechanical fall a couple of weeks ago and was placed on metoprolol  25 mg bid for afib secondary to the fall. She did convert in a couple of days and then stopped BB with issues with hypotension and bradycardia. Now however, her BP has been running higher, around 150-160 systolic.  Continues on xarelto  20 mg daily with a CHA2DS2VASc of at least 6, without any signs of bleeding.   F/u with afib clinic 06/19/21. She is staying in SR with Tikosyn . She has had an lumpectomy and radiation. Is finished with treatment now and is on daily. Arimidex . She is pending a colonoscopy and has received instruction re holding her DOAC. Reminded to take Tikosyn  with a sip of water.    F/u in afib clinic, 07/22/21,  after a hemo peritoneal bleed around her spleen following a colonoscopy. She had minimal cbc drop and was observed x 2 days in the hospital. It did resolve without any intervention. She was switched form xarelto  to eliquis  on d/c.   F/u in afib clinic, 12/30/21 for ongoing  Tikosyn  use. She is continuing  in SR. No issues with anticoagulation. States that she has has had some recent stomach upset with diarrhea. Will check labs today.   F/u 06/30/22 for tikosyn  surveillance. Ekg shows SR. No afib to report. Has had some issues with L more so than Rt LE cellulitis. Has recently had her 3rd round of antibiotics. Started with an injury to LL leg. Recently fitted with support hose by PCP.   F/u in afib clinic,12/29/22 for Tikosyn  surveillance. She is in SR with stable qt. She had an upper respiratory infection in December but is now resolved. No complaints voiced today.   F/u in afib clinic, 04/12/23. She is currently  in SR. She feels like she has been back in Afib for the past week. She feels tired and short of breath and sometimes feels her heart flipping. She has not checked her heart rhythm at home.   F/u in Afib clinic, 05/12/23. She is currently in NSR. She wore cardiac monitor which showed 8% PVCs and no Afib. Since last office visit, she feels her heart flipping sometimes but perhaps not as frequent as prior. No missed doses of Tikosyn  or anticoagulant.  F/u in Afib clinic, 11/11/23. She is currently in NSR. Patient is here for Tikosyn  surveillance. She has had no episodes of Afib since last office visit. She did have Covid last month and felt like her heart was flip flopping briefly but never went into Afib. No missed doses of Tikosyn  or Eliquis .   Follow up in Afib clinic, 05/18/24. Patient is here for Tikosyn  surveillance. She has had no episodes of Afib since last office visit. She is planning for left knee replacement to be done in August. No missed doses of Tikosyn  or Eliquis .   Today, she denies symptoms of palpitations, chest pain, shortness of breath, orthopnea, PND, lower extremity edema, dizziness, presyncope, syncope, or neurologic sequela. The patient is tolerating medications without difficulties and is otherwise without complaint today.   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   GERD (gastroesophageal reflux disease)    HTN (hypertension)    Hyperlipidemia    Personal history of radiation therapy    TIA (transient ischemic attack)    Past Surgical History:  Procedure Laterality Date   BOTOX  INJECTION N/A 12/22/2018   Procedure: BOTOX  INJECTION INTO ANAL SPHINCTER;  Surgeon: Melvenia Stabs, MD;  Location: [facility];  Service: General;  Laterality: N/A;   BREAST BIOPSY Left 11/26/2020   BREAST LUMPECTOMY WITH RADIOACTIVE SEED AND SENTINEL LYMPH NODE BIOPSY Left 01/07/2021   Procedure: LEFT BREAST  LUMPECTOMY WITH RADIOACTIVE SEED AND LEFT AXILLARY SENTINEL LYMPH NODE BIOPSY WITH BLUE DYE INJECTION;  Surgeon: Dareen Ebbing, MD;  Location: [facility];  Service: General;  Laterality: Left;  LMA WITH PECTORAL BLOCK, BLUE DYE INJECTION   CARDIOVERSION N/A 01/04/2018   Procedure: CARDIOVERSION;  Surgeon: Liza Riggers, MD;  Location: [facility];  Service: Cardiovascular;  Laterality: N/A;   CARDIOVERSION N/A 09/20/2018   Procedure: CARDIOVERSION;  Surgeon: Jacqueline Matsu, MD;  Location: [facility] ENDOSCOPY;  Service: Cardiovascular;  Laterality: N/A;   CARDIOVERSION N/A 10/27/2018   Procedure: CARDIOVERSION;  Surgeon: Sheryle Donning, MD;  [facility] END[facility];  Service: Cardiovascular;  Laterality: N/A;   KNEE SURGERY Bilateral    Torn miniscus   RECTAL EXAM UNDER ANESTHESIA N/A 12/22/2018   Procedure: ANORECTAL  EXAM UNDER ANESTHESIA;  Surgeon: Melvenia Stabs, MD;  Location: [facility];  Service: General;  Laterality: N/A;   RECTOCELE REPAIR     TONSILLECTOMY     VAGINAL HYSTERECTOMY      Current Outpatient Medications  Medication Sig Dispense Refill   acetaminophen  (TYLENOL ) 500 MG tablet Take 1,000 mg by mouth as needed for moderate pain (pain score 4-6) or headache.     amLODipine  (NORVASC ) 2.5 MG tablet Take 1 tablet (2.5 mg total) by mouth daily. *Note dose change (Patient taking differently: Take 5 mg by mouth daily. *Note dose change) 90 tablet 3   anastrozole  (ARIMIDEX ) 1 MG tablet Take 1 tablet (1 mg total) by mouth daily. 90 tablet 2   apixaban  (ELIQUIS ) 5 MG TABS tablet Take 1 tablet (5 mg total) by mouth 2 (two) times daily. 28 tablet    Cholecalciferol  (VITAMIN D3) 5000 units CAPS Take 5,000 Units by mouth daily with supper.     dofetilide  (TIKOSYN ) 500 MCG capsule Take 1 capsule (500 mcg total) by mouth 2 (two) times daily. 180 capsule 2   ezetimibe  (ZETIA ) 10 MG tablet Take 1 tablet (10 mg total) by mouth daily. 90 tablet 3   famotidine  (PEPCID ) 20  MG tablet Take 1 tablet (20 mg total) by mouth 2 (two) times daily. 180 tablet 3   furosemide  (LASIX ) 20 MG tablet Take 1 tablet (20 mg total) by mouth daily. 90 tablet 3   LORazepam  (ATIVAN ) 0.5 MG tablet Take 0.5-1 tablets (0.25-0.5 mg total) by mouth daily as needed for anxiety (put on hold). 20 tablet 2   nystatin  cream (MYCOSTATIN ) Apply 1 Application topically 2 (two) times daily. X5-10 days as needed for rash in groin area 30 g 2   olmesartan  (BENICAR ) 5 MG tablet Take 1 tablet (5 mg total) by mouth daily. 90 tablet 3   potassium chloride  SA (KLOR-CON  M) 20 MEQ tablet Take 1 tablet (20 mEq total) by mouth 2 (two) times daily. 180 tablet 3   triamcinolone  cream (KENALOG ) 0.1 % Apply 1 Application topically 2 (two) times daily. With tip of finger X5 days  as needed for ear dermatitis 30 g 0   No current facility-administered medications for this encounter.    Allergies  Allergen Reactions   Augmentin [Amoxicillin-Pot Clavulanate] Other (See Comments)    c-diff Has patient had a PCN reaction causing immediate rash, facial/tongue/throat swelling, SOB or lightheadedness with hypotension: No Has patient had a PCN reaction causing severe rash involving mucus membranes or skin necrosis: No Has patient had a PCN reaction that required hospitalization: No Has patient had a PCN reaction occurring within the last 10 years: Yes If all of the above answers are NO, then may proceed with Cephalosporin use.    Codeine Palpitations and Rash   Prednisone  Other (See Comments)    Jittery, red in the face   Celecoxib Other (See Comments)   ROS- All systems are reviewed and negative except as per the HPI above  Physical Exam: Vitals:   05/18/24 1030  BP: (!) 158/78  Pulse: 60  Weight: 111 kg  Height: 5' 4 (1.626 m)      Wt Readings from Last 3 Encounters:  05/18/24 111 kg  04/03/24 107 kg  12/29/23 107.4 kg    Labs: Lab Results  Component Value Date   NA 140 11/11/2023   K 4.8  11/11/2023   CL 106 11/11/2023   CO2 26 11/11/2023   GLUCOSE 101 (H) 11/11/2023   BUN 15 11/11/2023   CREATININE 0.68 11/11/2023   CALCIUM  9.5 11/11/2023   PHOS 4.1 06/30/2021   MG 2.3 11/11/2023   Lab Results  Component Value Date   INR 1.7 (H) 06/29/2021   Lab Results  Component Value Date   CHOL 164 05/24/2023   HDL 56 05/24/2023   LDLCALC 87 05/24/2023   TRIG 118 05/24/2023   GEN- The patient is well appearing, alert and oriented x 3 today.   Neck - no JVD or carotid bruit noted Lungs- Clear to ausculation bilaterally, normal work of breathing Heart- Regular rate and rhythm, no murmurs, rubs or gallops, PMI not laterally displaced Extremities- no clubbing, cyanosis, or edema Skin - no rash or ecchymosis noted   EKG - Vent. rate 60 BPM PR interval 242 ms QRS duration 142 ms QT/QTcB 468/468 ms P-R-T axes 59 -54 26 Sinus rhythm with 1st degree A-V block Left axis deviation Right bundle branch block Minimal voltage criteria for LVH, may be normal variant ( R in aVL ) Inferior infarct , age undetermined Anterolateral infarct (cited on or before 27-Oct-2018) Abnormal ECG When compared with ECG of 11-Nov-2023 10:27, RBBB is new and PR interval has increased   ECHO 11/17/2017: - Left ventricle: The cavity size was normal. There was mild    concentric hypertrophy. Systolic function was normal. The    estimated ejection fraction was in the range of 60% to 65%. Wall    motion was normal; there were no regional wall motion    abnormalities.  - Aortic valve: Transvalvular velocity was within the normal range.    There was no stenosis. There was no regurgitation.  - Aorta: Ascending aortic diameter: 38 mm (S).  - Ascending aorta: The ascending aorta was mildly dilated.  - Mitral valve: Mildly calcified annulus. Transvalvular velocity    was within the normal range. There was no evidence for stenosis.    There was mild regurgitation.  - Left atrium: The atrium was  mildly dilated.  - Right ventricle: The cavity size was normal. Wall thickness was    normal. Systolic function was normal.  -  Atrial septum: No defect or patent foramen ovale was identified.  - Tricuspid valve: There was mild-moderate regurgitation.  - Pulmonic valve: There was moderate regurgitation.  - Pulmonary arteries: Systolic pressure was severely increased. PA    peak pressure: 69 mm Hg (S).   Cardiac monitor 5/6-20/24:  Patch Wear Time:  13 days and 19 hours    Predominant rhythm was sinus rhythm 2 SVT episodes, longest and fastest 1 minute 17 seconds at 130 bpm No atrial fibrillation Less than 1% supraventricular ectopy 8% ventricular ectopy Patient triggered episodes associated with both sinus rhythm and sinus rhythm with PVCs  CHA2DS2-VASc Score = 6  The patient's score is based upon: CHF History: 0 HTN History: 1 Diabetes History: 0 Stroke History: 2 Vascular Disease History: 0 Age Score: 2 Gender Score: 1       ASSESSMENT AND PLAN: Persistent Atrial Fibrillation (ICD10:  I48.19) The patient's CHA2DS2-VASc score is 6, indicating a 9.7% annual risk of stroke.   Patient has been on Tikosyn  since 10/2018.   She is currently in NSR.  High risk medication monitoring (ICD10: U5195107) Patient requires ongoing monitoring for anti-arrhythmic medication which has the potential to cause life threatening arrhythmias or AV block. Qtc stable. Continue Tikosyn  500 mcg BID. Bmet and mag drawn today.  Secondary Hypercoagulable State (ICD10:  D68.69) The patient is at significant risk for stroke/thromboembolism based upon her CHA2DS2-VASc Score of 6.  Continue Apixaban  (Eliquis ).   Continue Eliquis  5 mg BID without interruption. From an Afib standpoint, okay to hold Eliquis  as dictated by surgeon prior to knee replacement and resume when appropriate.   HTN Elevated in office but stable at home. Continue to monitor.     Follow up with Afib clinic in 6 months for  Tikosyn  surveillance.  Minnie Amber, PA-C [facility] [address] [place], [place] [address] [phone]

## 2024-05-19 ENCOUNTER — Ambulatory Visit (HOSPITAL_COMMUNITY): Payer: Self-pay | Admitting: Internal Medicine

## 2024-05-19 LAB — BASIC METABOLIC PANEL WITH GFR
BUN/Creatinine Ratio: 21 (ref 12–28)
BUN: 15 mg/dL (ref 8–27)
CO2: 21 mmol/L (ref 20–29)
Calcium: 9.6 mg/dL (ref 8.7–10.3)
Chloride: 105 mmol/L (ref 96–106)
Creatinine, Ser: 0.71 mg/dL (ref 0.57–1.00)
Glucose: 94 mg/dL (ref 70–99)
Potassium: 4.4 mmol/L (ref 3.5–5.2)
Sodium: 143 mmol/L (ref 134–144)
eGFR: 88 mL/min/{1.73_m2} (ref >59)

## 2024-05-19 LAB — MAGNESIUM: Magnesium: 2.1 mg/dL (ref 1.6–2.3)

## 2024-05-25 ENCOUNTER — Encounter: Payer: Self-pay | Admitting: Family Medicine

## 2024-05-25 ENCOUNTER — Ambulatory Visit: Admitting: Family Medicine

## 2024-05-25 VITALS — BP 150/65 | HR 60 | Ht 64.0 in | Wt 243.0 lb

## 2024-05-25 DIAGNOSIS — L247 Irritant contact dermatitis due to plants, except food: Secondary | ICD-10-CM

## 2024-05-25 MED ORDER — METHYLPREDNISOLONE ACETATE 80 MG/ML IJ SUSP
80.0000 mg | Freq: Once | INTRAMUSCULAR | Status: AC
  Administered 2024-05-25: 80 mg via INTRA_ARTICULAR

## 2024-05-25 NOTE — Progress Notes (Unsigned)
 Acute Office Visit  Subjective:     Patient ID: [name] Collier, female    DOB: January 06, 1946, 78 y.o.   MRN: [id number]  Chief Complaint  Patient presents with   Rash    Left forearm    HPI Patient is present with a 2-week history of an itchy rash on her left lower arm. Patient reports that the lesions appeared rapidly and have not spread to any other locations. She describes the affected area as very itchy and reports that sometimes she feels a burning sensation. She has not noticed any bleeding or discharge. Se has user OTC cortisone cream and benadryl  which provided some symptomatic relief, but the symptoms comes back when medication wanes. Patient is not able to identify any aggravating factors or allergens. She denies having fever, chills, or myalgia. She also denies using new soap, detergent, clothes, creams, or medications. She mentions that she visit her son a couple weeks ago and spent some time with their pets. She reports that the symptoms are not severe but they affect her daily life due to continuous itching.   Review of Systems  Constitutional:  Negative for chills, fever and weight loss.  HENT:  Negative for congestion, hearing loss and sore throat.   Eyes:  Negative for blurred vision.  Respiratory:  Negative for cough and shortness of breath.   Cardiovascular:  Negative for chest pain, palpitations and leg swelling.  Gastrointestinal:  Negative for abdominal pain, constipation, diarrhea, nausea and vomiting.  Genitourinary:  Negative for dysuria, frequency and urgency.  Musculoskeletal:  Negative for joint pain and myalgias.  Skin:  Positive for itching and rash.  Neurological:  Negative for dizziness, tingling and weakness.  Psychiatric/Behavioral:  Negative for depression. The patient is not nervous/anxious.         Objective:    BP (!) 150/65   Pulse 60   Ht 5' 4 (1.626 m)   Wt 243 lb (110.2 kg)   SpO2 98%   BMI 41.71 kg/m  {Vitals History  (Optional):23777}  Physical Exam Constitutional:      Appearance: Normal appearance.  HENT:     Head: Normocephalic and atraumatic.     Nose: Nose normal.   Eyes:     Extraocular Movements: Extraocular movements intact.     Conjunctiva/sclera: Conjunctivae normal.    Cardiovascular:     Rate and Rhythm: Normal rate and regular rhythm.     Pulses: Normal pulses.     Heart sounds: Normal heart sounds. No murmur heard.    No gallop.  Pulmonary:     Effort: Pulmonary effort is normal.     Breath sounds: Normal breath sounds.  Abdominal:     General: Abdomen is flat.     Palpations: Abdomen is soft.   Musculoskeletal:        General: Normal range of motion.     Cervical back: Normal range of motion.   Skin:    General: Skin is warm and dry.     Findings: Rash (Localized multiple red papules on left lower arm) present.   Neurological:     Mental Status: She is alert and oriented to person, place, and time.   Psychiatric:        Mood and Affect: Mood normal.    No results found for any visits on 05/25/24.      Assessment & Plan:   (1) Rash on left lower arm - Assessment: Most likely contact dermatitis caused by exposure to some irritant  while gardening or spending time with pets. Lack of warmth, discharge, and systemic symptoms make infectious causes less likely. - Plan: Administered a steroid shot today in clinic. Advised to continue using cortisone cream as needed. RTC if symptoms do not improve or worsen.    No orders of the defined types were placed in this encounter.   Return if symptoms worsen or fail to improve.  Levert Ready, Medical Student

## 2024-05-31 DIAGNOSIS — N3941 Urge incontinence: Secondary | ICD-10-CM | POA: Diagnosis not present

## 2024-06-01 DIAGNOSIS — Z96651 Presence of right artificial knee joint: Secondary | ICD-10-CM | POA: Diagnosis not present

## 2024-06-01 DIAGNOSIS — M25561 Pain in right knee: Secondary | ICD-10-CM | POA: Diagnosis not present

## 2024-06-01 DIAGNOSIS — M25562 Pain in left knee: Secondary | ICD-10-CM | POA: Diagnosis not present

## 2024-06-01 DIAGNOSIS — M1712 Unilateral primary osteoarthritis, left knee: Secondary | ICD-10-CM | POA: Diagnosis not present

## 2024-06-11 DIAGNOSIS — M1712 Unilateral primary osteoarthritis, left knee: Secondary | ICD-10-CM | POA: Diagnosis not present

## 2024-06-11 DIAGNOSIS — Z01818 Encounter for other preprocedural examination: Secondary | ICD-10-CM | POA: Diagnosis not present

## 2024-07-12 DIAGNOSIS — Z853 Personal history of malignant neoplasm of breast: Secondary | ICD-10-CM | POA: Diagnosis not present

## 2024-07-12 DIAGNOSIS — F419 Anxiety disorder, unspecified: Secondary | ICD-10-CM | POA: Diagnosis not present

## 2024-07-12 DIAGNOSIS — G459 Transient cerebral ischemic attack, unspecified: Secondary | ICD-10-CM | POA: Diagnosis not present

## 2024-07-12 DIAGNOSIS — R600 Localized edema: Secondary | ICD-10-CM | POA: Diagnosis not present

## 2024-07-12 DIAGNOSIS — Z8619 Personal history of other infectious and parasitic diseases: Secondary | ICD-10-CM | POA: Diagnosis not present

## 2024-07-12 DIAGNOSIS — E782 Mixed hyperlipidemia: Secondary | ICD-10-CM | POA: Diagnosis not present

## 2024-07-12 DIAGNOSIS — I10 Essential (primary) hypertension: Secondary | ICD-10-CM | POA: Diagnosis not present

## 2024-07-12 DIAGNOSIS — I4819 Other persistent atrial fibrillation: Secondary | ICD-10-CM | POA: Diagnosis not present

## 2024-07-12 DIAGNOSIS — K219 Gastro-esophageal reflux disease without esophagitis: Secondary | ICD-10-CM | POA: Diagnosis not present

## 2024-07-12 DIAGNOSIS — M1712 Unilateral primary osteoarthritis, left knee: Secondary | ICD-10-CM | POA: Diagnosis not present

## 2024-07-12 DIAGNOSIS — Z01818 Encounter for other preprocedural examination: Secondary | ICD-10-CM | POA: Diagnosis not present

## 2024-07-12 DIAGNOSIS — E66813 Obesity, class 3: Secondary | ICD-10-CM | POA: Diagnosis not present

## 2024-07-12 DIAGNOSIS — Z6841 Body Mass Index (BMI) 40.0 and over, adult: Secondary | ICD-10-CM | POA: Diagnosis not present

## 2024-07-18 NOTE — Assessment & Plan Note (Signed)
 12/20/2020: Screening mammogram detected left breast asymmetry.  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   Oncotype Dx 10: ROR 3%   Plan: 1. Adj RT completed 03/12/2021 2. Foll by Adj Anti-estrogen therapy started March 2022   Anastrozole  toxicities: Denies any adverse effects to anastrozole .  Very occasional hot flashes. Chronic osteoarthritis of bilateral knees: Making it difficult for her to exercise.   Breast cancer surveillance: 1.  Breast exam 07/19/2024: Benign 2. mammogram 01/10/24: Benign breast density category C   Right knee replacement surgery June 2024.

## 2024-07-19 ENCOUNTER — Inpatient Hospital Stay: Payer: PPO | Attending: Hematology and Oncology | Admitting: Hematology and Oncology

## 2024-07-19 VITALS — BP 194/55 | HR 62 | Temp 97.8°F | Resp 18 | Ht 64.0 in | Wt 243.8 lb

## 2024-07-19 DIAGNOSIS — Z1732 Human epidermal growth factor receptor 2 negative status: Secondary | ICD-10-CM | POA: Diagnosis not present

## 2024-07-19 DIAGNOSIS — M8580 Other specified disorders of bone density and structure, unspecified site: Secondary | ICD-10-CM | POA: Diagnosis not present

## 2024-07-19 DIAGNOSIS — Z1721 Progesterone receptor positive status: Secondary | ICD-10-CM | POA: Insufficient documentation

## 2024-07-19 DIAGNOSIS — Z79811 Long term (current) use of aromatase inhibitors: Secondary | ICD-10-CM | POA: Diagnosis not present

## 2024-07-19 DIAGNOSIS — Z96651 Presence of right artificial knee joint: Secondary | ICD-10-CM | POA: Diagnosis not present

## 2024-07-19 DIAGNOSIS — Z170 Estrogen receptor positive status [ER+]: Secondary | ICD-10-CM | POA: Insufficient documentation

## 2024-07-19 DIAGNOSIS — Z923 Personal history of irradiation: Secondary | ICD-10-CM | POA: Diagnosis not present

## 2024-07-19 DIAGNOSIS — C50212 Malignant neoplasm of upper-inner quadrant of left female breast: Secondary | ICD-10-CM | POA: Insufficient documentation

## 2024-07-19 DIAGNOSIS — Z780 Asymptomatic menopausal state: Secondary | ICD-10-CM | POA: Diagnosis not present

## 2024-07-19 DIAGNOSIS — M170 Bilateral primary osteoarthritis of knee: Secondary | ICD-10-CM | POA: Diagnosis not present

## 2024-07-19 NOTE — Progress Notes (Signed)
 Patient Care Team: Jolinda Norene HERO, DO as PCP - General (Family Medicine) Lavona Agent, MD as PCP - Cardiology (Cardiology) Kelsie Agent, MD (Inactive) as PCP - Electrophysiology (Cardiology) Odean Potts, MD as Consulting Physician (Hematology and Oncology) Donnald Charleston, MD as Consulting Physician (Gastroenterology) Belinda Cough, MD as Consulting Physician (General Surgery) [facility] Izell Domino, MD as Attending Physician (Radiation Oncology) Billee Mliss BIRCH, [facility] as Pharmacist (Family Medicine) Frances Ozell RAMAN, LCSW as [facility] Care Management (Licensed Clinical Social Worker) Shona Rush, MD (Dermatology)  DIAGNOSIS:  Encounter Diagnosis  Name Primary?   Carcinoma of upper-inner quadrant of left breast in female, estrogen receptor positive (HCC) Yes    SUMMARY OF ONCOLOGIC HISTORY: Oncology History  Carcinoma of upper-inner quadrant of left breast in female, estrogen receptor positive (HCC)  12/20/2020 Initial Diagnosis   Screening mammogram detected a left breast asymmetry. Diagnostic mammogram and US  showed a 0.7cm mass at the 10 o'clock position and normal left axillary lymph nodes. Biopsy showed invasive lobular carcinoma, grade 1-2, HER-2 negative (1+), ER+ 50%, PR+ 90%, Ki67 10%.   12/25/2020 Cancer Staging   Staging form: Breast, AJCC 8th Edition - Clinical: Stage IA (cT1b, cN0, cM0, G2, ER+, PR+, HER2-)   01/07/2021 Surgery   Left lumpectomy (Tsuei) ([id number]): invasive and in situ lobular carcinoma, grade 2, 1.8cm, clear margins, and no evidence of carcinoma in 3 left axillary lymph nodes.   01/20/2021 Oncotype testing   The Oncotype DX score was 10 predicting a risk of outside the breast recurrence over the next 9 years of 3% if the patient's only systemic therapy is tamoxifen for 5 years.     02/18/2021 - 03/12/2021 Radiation Therapy   The patient initially received a dose of 42.56 Gy in 16 fractions to the  breast using whole-breast tangent fields. This was delivered using a 3-D conformal technique.   02/2021 - 02/2026 Anti-estrogen oral therapy   Anastrozole      CHIEF COMPLIANT: Follow-up on anastrozole  therapy  HISTORY OF PRESENT ILLNESS:   History of Present Illness [name] Collier is a 78 year old female with breast cancer on anastrozole  therapy who presents for follow-up.  She has been on anastrozole  therapy for approximately two and a half years without significant side effects. Recent mammograms in February are normal. Her bone density was last checked in 2024, showing mild osteopenia. She is due for another DEXA scan next year in June.     ALLERGIES:  is allergic to augmentin [amoxicillin-pot clavulanate], codeine, prednisone , and celecoxib.  MEDICATIONS:  Current Outpatient Medications  Medication Sig Dispense Refill   acetaminophen  (TYLENOL ) 500 MG tablet Take 1,000 mg by mouth as needed for moderate pain (pain score 4-6) or headache.     amLODipine  (NORVASC ) 2.5 MG tablet Take 1 tablet (2.5 mg total) by mouth daily. *Note dose change 90 tablet 3   anastrozole  (ARIMIDEX ) 1 MG tablet Take 1 tablet (1 mg total) by mouth daily. 90 tablet 2   apixaban  (ELIQUIS ) 5 MG TABS tablet Take 1 tablet (5 mg total) by mouth 2 (two) times daily. 28 tablet    Cholecalciferol  (VITAMIN D3) 5000 units CAPS Take 5,000 Units by mouth daily with supper.     dofetilide  (TIKOSYN ) 500 MCG capsule Take 1 capsule (500 mcg total) by mouth 2 (two) times daily. 180 capsule 2   ezetimibe  (ZETIA ) 10 MG tablet Take 1 tablet (10 mg total) by mouth daily. 90 tablet 3   famotidine  (PEPCID ) 20 MG tablet  Take 1 tablet (20 mg total) by mouth 2 (two) times daily. 180 tablet 3   furosemide  (LASIX ) 20 MG tablet Take 1 tablet (20 mg total) by mouth daily. 90 tablet 3   LORazepam  (ATIVAN ) 0.5 MG tablet Take 0.5-1 tablets (0.25-0.5 mg total) by mouth daily as needed for anxiety (put on hold). 20 tablet 2   nystatin  cream  (MYCOSTATIN ) Apply 1 Application topically 2 (two) times daily. X5-10 days as needed for rash in groin area 30 g 2   olmesartan  (BENICAR ) 5 MG tablet Take 1 tablet (5 mg total) by mouth daily. 90 tablet 3   potassium chloride  SA (KLOR-CON  M) 20 MEQ tablet Take 1 tablet (20 mEq total) by mouth 2 (two) times daily. 180 tablet 3   No current facility-administered medications for this visit.    PHYSICAL EXAMINATION: ECOG PERFORMANCE STATUS: 1 - Symptomatic but completely ambulatory  Vitals:   07/19/24 1125  BP: (!) 194/55  Pulse: 62  Resp: 18  Temp: 97.8 F (36.6 C)  SpO2: 98%   Filed Weights   07/19/24 1125  Weight: 243 lb 12.8 oz (110.6 kg)   Breast exam: No palpable lumps noted to bilateral breasts or axilla  LABORATORY DATA:  I have reviewed the data as listed    Latest Ref Rng & Units 05/18/2024   11:24 AM 11/11/2023   10:12 AM 05/12/2023   10:39 AM  CMP  Glucose 70 - 99 mg/dL 94  898  893   BUN 8 - 27 mg/dL 15  15  18    Creatinine 0.57 - 1.00 mg/dL 9.28  9.31  9.08   Sodium 134 - 144 mmol/L 143  140  139   Potassium 3.5 - 5.2 mmol/L 4.4  4.8  4.3   Chloride 96 - 106 mmol/L 105  106  107   CO2 20 - 29 mmol/L 21  26  24    Calcium  8.7 - 10.3 mg/dL 9.6  9.5  9.7     Lab Results  Component Value Date   WBC 4.9 01/20/2023   HGB 13.5 01/20/2023   HCT 41.4 01/20/2023   MCV 90 01/20/2023   PLT 249 01/20/2023   NEUTROABS 2.4 01/20/2023    ASSESSMENT & PLAN:  Carcinoma of upper-inner quadrant of left breast in female, estrogen receptor positive (HCC) 12/20/2020: Screening mammogram detected left breast asymmetry.  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   Oncotype Dx 10: ROR 3%   Plan: 1. Adj RT completed 03/12/2021 2. Foll by Adj Anti-estrogen therapy started March 2022   Anastrozole  toxicities: Denies any adverse effects to anastrozole .  Very occasional hot flashes. Chronic osteoarthritis of bilateral knees: Making it difficult for her to exercise.   Breast cancer surveillance: 1.  Breast exam 07/19/2024: Benign 2. mammogram 01/10/24: Benign breast density category C   Right knee replacement surgery June 2024. She is planning knee replacement surgery on her left knee in the next few weeks ------------------------------------- Assessment and Plan Assessment & Plan Estrogen receptor positive carcinoma of left breast, status post hormone therapy On anastrozole  for three years without side effects. Recent mammograms show well-managed disease.  Osteopenia Bone density scan showed a  little bit of osteopenia, almost normal, well-managed. - Schedule bone density scan in June next year at [facility].      No orders of the defined types were placed in this encounter.  The patient has a good understanding of the overall plan. she agrees with it. she will call with any problems that may develop before the next visit here. Total time spent: 30 mins including face to face time and time spent for planning, charting and co-ordination of care   Naomi MARLA Chad, MD 07/19/24

## 2024-07-20 DIAGNOSIS — M25562 Pain in left knee: Secondary | ICD-10-CM | POA: Diagnosis not present

## 2024-07-25 ENCOUNTER — Telehealth: Payer: Self-pay | Admitting: Family Medicine

## 2024-07-25 DIAGNOSIS — Z0279 Encounter for issue of other medical certificate: Secondary | ICD-10-CM

## 2024-07-25 NOTE — Telephone Encounter (Signed)
 PT dropped off HANDICAP forms to be completed and signed.  Form Fee Paid? (YES)            If NO, form is placed on front office manager desk to hold until payment received. If YES, then form will be placed in the RX/HH Nurse Coordinators box for completion.  Form will not be processed until payment is received

## 2024-07-26 DIAGNOSIS — K219 Gastro-esophageal reflux disease without esophagitis: Secondary | ICD-10-CM | POA: Diagnosis not present

## 2024-07-26 DIAGNOSIS — Z888 Allergy status to other drugs, medicaments and biological substances status: Secondary | ICD-10-CM | POA: Diagnosis not present

## 2024-07-26 DIAGNOSIS — R591 Generalized enlarged lymph nodes: Secondary | ICD-10-CM | POA: Diagnosis not present

## 2024-07-26 DIAGNOSIS — M25762 Osteophyte, left knee: Secondary | ICD-10-CM | POA: Diagnosis not present

## 2024-07-26 DIAGNOSIS — Z471 Aftercare following joint replacement surgery: Secondary | ICD-10-CM | POA: Diagnosis not present

## 2024-07-26 DIAGNOSIS — Z96652 Presence of left artificial knee joint: Secondary | ICD-10-CM | POA: Diagnosis not present

## 2024-07-26 DIAGNOSIS — E785 Hyperlipidemia, unspecified: Secondary | ICD-10-CM | POA: Diagnosis not present

## 2024-07-26 DIAGNOSIS — I10 Essential (primary) hypertension: Secondary | ICD-10-CM | POA: Diagnosis not present

## 2024-07-26 DIAGNOSIS — M25662 Stiffness of left knee, not elsewhere classified: Secondary | ICD-10-CM | POA: Diagnosis not present

## 2024-07-26 DIAGNOSIS — Z79899 Other long term (current) drug therapy: Secondary | ICD-10-CM | POA: Diagnosis not present

## 2024-07-26 DIAGNOSIS — Z7901 Long term (current) use of anticoagulants: Secondary | ICD-10-CM | POA: Diagnosis not present

## 2024-07-26 DIAGNOSIS — M1712 Unilateral primary osteoarthritis, left knee: Secondary | ICD-10-CM | POA: Diagnosis not present

## 2024-07-26 DIAGNOSIS — R2689 Other abnormalities of gait and mobility: Secondary | ICD-10-CM | POA: Diagnosis not present

## 2024-07-26 DIAGNOSIS — Z885 Allergy status to narcotic agent status: Secondary | ICD-10-CM | POA: Diagnosis not present

## 2024-07-26 DIAGNOSIS — I083 Combined rheumatic disorders of mitral, aortic and tricuspid valves: Secondary | ICD-10-CM | POA: Diagnosis not present

## 2024-07-26 DIAGNOSIS — Z6841 Body Mass Index (BMI) 40.0 and over, adult: Secondary | ICD-10-CM | POA: Diagnosis not present

## 2024-07-26 DIAGNOSIS — M6281 Muscle weakness (generalized): Secondary | ICD-10-CM | POA: Diagnosis not present

## 2024-07-26 DIAGNOSIS — F419 Anxiety disorder, unspecified: Secondary | ICD-10-CM | POA: Diagnosis not present

## 2024-07-26 DIAGNOSIS — E669 Obesity, unspecified: Secondary | ICD-10-CM | POA: Diagnosis not present

## 2024-07-26 DIAGNOSIS — Z880 Allergy status to penicillin: Secondary | ICD-10-CM | POA: Diagnosis not present

## 2024-07-26 DIAGNOSIS — I4819 Other persistent atrial fibrillation: Secondary | ICD-10-CM | POA: Diagnosis not present

## 2024-07-26 DIAGNOSIS — R600 Localized edema: Secondary | ICD-10-CM | POA: Diagnosis not present

## 2024-07-26 DIAGNOSIS — G8918 Other acute postprocedural pain: Secondary | ICD-10-CM | POA: Diagnosis not present

## 2024-07-26 DIAGNOSIS — Z8673 Personal history of transient ischemic attack (TIA), and cerebral infarction without residual deficits: Secondary | ICD-10-CM | POA: Diagnosis not present

## 2024-07-26 HISTORY — PX: TOTAL KNEE ARTHROPLASTY: SHX125

## 2024-07-28 DIAGNOSIS — Z9181 History of falling: Secondary | ICD-10-CM | POA: Diagnosis not present

## 2024-07-28 DIAGNOSIS — Z96651 Presence of right artificial knee joint: Secondary | ICD-10-CM | POA: Diagnosis not present

## 2024-07-28 DIAGNOSIS — I509 Heart failure, unspecified: Secondary | ICD-10-CM | POA: Diagnosis not present

## 2024-07-28 DIAGNOSIS — Z8673 Personal history of transient ischemic attack (TIA), and cerebral infarction without residual deficits: Secondary | ICD-10-CM | POA: Diagnosis not present

## 2024-07-28 DIAGNOSIS — Z6841 Body Mass Index (BMI) 40.0 and over, adult: Secondary | ICD-10-CM | POA: Diagnosis not present

## 2024-07-28 DIAGNOSIS — F329 Major depressive disorder, single episode, unspecified: Secondary | ICD-10-CM | POA: Diagnosis not present

## 2024-07-28 DIAGNOSIS — K219 Gastro-esophageal reflux disease without esophagitis: Secondary | ICD-10-CM | POA: Diagnosis not present

## 2024-07-28 DIAGNOSIS — Z96652 Presence of left artificial knee joint: Secondary | ICD-10-CM | POA: Diagnosis not present

## 2024-07-28 DIAGNOSIS — F419 Anxiety disorder, unspecified: Secondary | ICD-10-CM | POA: Diagnosis not present

## 2024-07-28 DIAGNOSIS — Z853 Personal history of malignant neoplasm of breast: Secondary | ICD-10-CM | POA: Diagnosis not present

## 2024-07-28 DIAGNOSIS — I110 Hypertensive heart disease with heart failure: Secondary | ICD-10-CM | POA: Diagnosis not present

## 2024-07-28 DIAGNOSIS — I071 Rheumatic tricuspid insufficiency: Secondary | ICD-10-CM | POA: Diagnosis not present

## 2024-07-28 DIAGNOSIS — E785 Hyperlipidemia, unspecified: Secondary | ICD-10-CM | POA: Diagnosis not present

## 2024-07-28 DIAGNOSIS — Z860100 Personal history of colon polyps, unspecified: Secondary | ICD-10-CM | POA: Diagnosis not present

## 2024-07-28 DIAGNOSIS — I272 Pulmonary hypertension, unspecified: Secondary | ICD-10-CM | POA: Diagnosis not present

## 2024-07-28 DIAGNOSIS — Z471 Aftercare following joint replacement surgery: Secondary | ICD-10-CM | POA: Diagnosis not present

## 2024-07-28 DIAGNOSIS — Z79811 Long term (current) use of aromatase inhibitors: Secondary | ICD-10-CM | POA: Diagnosis not present

## 2024-07-28 DIAGNOSIS — Z7901 Long term (current) use of anticoagulants: Secondary | ICD-10-CM | POA: Diagnosis not present

## 2024-07-28 DIAGNOSIS — I4819 Other persistent atrial fibrillation: Secondary | ICD-10-CM | POA: Diagnosis not present

## 2024-07-28 NOTE — Telephone Encounter (Signed)
 Aware handicap form ready

## 2024-08-25 ENCOUNTER — Other Ambulatory Visit: Payer: Self-pay

## 2024-08-25 ENCOUNTER — Other Ambulatory Visit: Payer: Self-pay | Admitting: *Deleted

## 2024-08-25 ENCOUNTER — Encounter: Payer: Self-pay | Admitting: Family Medicine

## 2024-08-25 ENCOUNTER — Encounter: Payer: Self-pay | Admitting: Physical Therapy

## 2024-08-25 ENCOUNTER — Ambulatory Visit: Attending: Orthopedic Surgery | Admitting: Physical Therapy

## 2024-08-25 DIAGNOSIS — R600 Localized edema: Secondary | ICD-10-CM | POA: Diagnosis not present

## 2024-08-25 DIAGNOSIS — M25562 Pain in left knee: Secondary | ICD-10-CM | POA: Diagnosis not present

## 2024-08-25 DIAGNOSIS — M25662 Stiffness of left knee, not elsewhere classified: Secondary | ICD-10-CM | POA: Diagnosis not present

## 2024-08-25 DIAGNOSIS — G8929 Other chronic pain: Secondary | ICD-10-CM | POA: Insufficient documentation

## 2024-08-25 DIAGNOSIS — E785 Hyperlipidemia, unspecified: Secondary | ICD-10-CM

## 2024-08-25 MED ORDER — EZETIMIBE 10 MG PO TABS: 10.0000 mg | ORAL_TABLET | Freq: Every day | ORAL | 0 refills | Status: DC

## 2024-08-25 NOTE — Therapy (Signed)
 OUTPATIENT PHYSICAL THERAPY LOWER EXTREMITY EVALUATION   Patient Name: [name] Collier MRN: [id number] DOB:May 28, 1946, 78 y.o., female Today's Date: 08/25/2024  END OF SESSION:  PT End of Session - 08/25/24 1041     Visit Number 1    Number of Visits 12    Date for Recertification  10/06/24    PT Start Time 0933    PT Stop Time 1025    PT Time Calculation (min) 52 min    Activity Tolerance Patient tolerated treatment well    Behavior During Therapy [facility] for tasks assessed/performed          Past Medical History:  Diagnosis Date   Anxiety    Atrial fibrillation (HCC)    Cancer (HCC) 11/2020   left breast IMC   GERD (gastroesophageal reflux disease)    HTN (hypertension)    Hyperlipidemia    Personal history of radiation therapy    TIA (transient ischemic attack)    Past Surgical History:  Procedure Laterality Date   BOTOX  INJECTION N/A 12/22/2018   Procedure: BOTOX  INJECTION INTO ANAL SPHINCTER;  Surgeon: Teresa Lonni HERO, MD;  Location: [facility];  Service: General;  Laterality: N/A;   BREAST BIOPSY Left 11/26/2020   BREAST LUMPECTOMY WITH RADIOACTIVE SEED AND SENTINEL LYMPH NODE BIOPSY Left 01/07/2021   Procedure: LEFT BREAST LUMPECTOMY WITH RADIOACTIVE SEED AND LEFT AXILLARY SENTINEL LYMPH NODE BIOPSY WITH BLUE DYE INJECTION;  Surgeon: Belinda Cough, MD;  Location:  SURGERY CENTER;  Service: General;  Laterality: Left;  LMA WITH PECTORAL BLOCK, BLUE DYE INJECTION   CARDIOVERSION N/A 01/04/2018   Procedure: CARDIOVERSION;  Surgeon: Moose Leim DEL, MD;  Location: [facility] ENDOSCOPY;  Service: Cardiovascular;  Laterality: N/A;   CARDIOVERSION N/A 09/20/2018   Procedure: CARDIOVERSION;  Surgeon: Shlomo Wilbert SAUNDERS, MD;  Location: [facility] ENDOSCOPY;  Service: Cardiovascular;  Laterality: N/A;   CARDIOVERSION N/A 10/27/2018   Procedure: CARDIOVERSION;  Surgeon: Lonni Slain, MD;  Location: [facility] ENDOSCOPY;  Service: Cardiovascular;  Laterality: N/A;   KNEE SURGERY  Bilateral    Torn miniscus   RECTAL EXAM UNDER ANESTHESIA N/A 12/22/2018   Procedure: ANORECTAL  EXAM UNDER ANESTHESIA;  Surgeon: Teresa Lonni HERO, MD;  Location: [facility];  Service: General;  Laterality: N/A;   RECTOCELE REPAIR     TONSILLECTOMY     VAGINAL HYSTERECTOMY     Patient Active Problem List   Diagnosis Date Noted   Anxiety 05/13/2023   Bilateral lower extremity edema 05/13/2023   Class 3 severe obesity with body mass index (BMI) of 40.0 to 44.9 in adult 05/13/2023   Frailty 05/13/2023   History of Clostridium difficile colitis 05/13/2023   Pulmonary hypertension (HCC) 05/13/2023   History of left breast cancer 05/13/2023   Tricuspid valve insufficiency 05/13/2023   Hypercoagulable state due to persistent atrial fibrillation (HCC) 04/13/2023   Anal pain 02/16/2023   B12 deficiency 02/16/2023   Clostridium difficile colitis 02/16/2023   Dysphagia 02/16/2023   Family history of colon cancer 02/16/2023   Functional diarrhea 02/16/2023   Knee pain, acute 02/16/2023   History of colonic polyps 02/16/2023   Vitamin D  deficiency 02/16/2023   Adenomatous polyp of colon 04/30/2022   Esophageal reflux 04/30/2022   Family history of malignant neoplasm of digestive organs 04/30/2022   Arthralgia of left ankle 04/10/2022   Encounter for monitoring dofetilide  therapy 07/04/2021   Pain in both knees 06/23/2021   Carcinoma of upper-inner quadrant of left breast in female, estrogen receptor positive (HCC) 12/20/2020  Trigger thumb of left hand 06/28/2019   Depression, major, single episode, mild (HCC) 03/14/2019   Generalized anxiety disorder 03/14/2019   PAF (paroxysmal atrial fibrillation) (HCC)    Osteoarthritis of knee 04/20/2018   Post-menopausal 02/22/2018   Snoring 01/12/2018   Encounter for cardioversion procedure    Persistent atrial fibrillation 11/11/2017   Other fatigue 11/11/2017   Hyperlipemia 04/16/2017   Viral URI 11/27/2016   Weakness of left hand  05/11/2016   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    THERAPY DIAG:  Chronic pain of left knee  Stiffness of left knee, not elsewhere classified  Localized edema  Rationale for Evaluation and Treatment: Rehabilitation  ONSET DATE: 07/26/24 (surgery date).    SUBJECTIVE:   SUBJECTIVE STATEMENT: The patient presents to the clinic s/p left total knee replacement performed on 07/26/24.  She had a bout of cellulitis and was on antibiotics for 10 days.  She has some home health PT.  Her pain-level is a 2/10 today and described as sore.  Elevation and ice decrease pain.     PERTINENT HISTORY: Right TKA.   PAIN:  Are you having pain? Yes: NPRS scale: 2/10. Pain location: Left knee Pain description: Sore.   Aggravating factors: increased up time.   Relieving factors: Ice and elevation.    PRECAUTIONS: Other: No ultrasound.    RED FLAGS: None   WEIGHT BEARING RESTRICTIONS: No  FALLS:  Has patient fallen in last 6 months? No  LIVING ENVIRONMENT: Lives with: lives alone Lives in: House/apartment Has following equipment at home: [name] - 2 wheeled  OCCUPATION: Retired.    PLOF: Independent  PATIENT GOALS: Do more with less pain.      OBJECTIVE:   PATIENT SURVEYS:  LEFS:  56/80.    EDEMA:  Circumferential: 6 cms greater on left than right.    PALPATION: Mild tenderness.  Incisional site appears to be healing well.  Some steri-strips remaining.    LOWER EXTREMITY ROM:  Full active left knee extension and active flexion to 90 degrees and passive to 100 degrees.    LOWER EXTREMITY MMT:  Right knee strength 4+/5.      GAIT: Patient has a FWW but has been walking safely without an assistive device with a decrease in step and stride length.                                                                                                                                   TREATMENT DATE: 08/25/24:  Nustep x 10 minutes f/b LE elevation and vasopneumatic on low x 15 minutes.      PATIENT EDUCATION:  Education details:  Person educated:  International aid/development worker:  Education comprehension:   HOME EXERCISE PROGRAM:   ASSESSMENT:  CLINICAL IMPRESSION: The  patient presents to [facility] s/p left total knee replacement performed on 07/26/24.  She is doing very well in spite of a bout with cellulitis.  She has moderate edema currently.  She has full active left knee extension and active flexion to 90 degrees (passive to 100 degrees).  Her LEFS score is 56/80.    Patient will benefit from skilled PT intervention to address pain and deficits.  OBJECTIVE IMPAIRMENTS: Abnormal gait, decreased activity tolerance, decreased ROM, increased edema, and pain.   ACTIVITY LIMITATIONS: carrying, lifting, bending, and locomotion level  PARTICIPATION LIMITATIONS: meal prep, cleaning, laundry, community activity, and yard work  [facility] POTENTIAL: Excellent  CLINICAL DECISION MAKING: Stable/uncomplicated  EVALUATION COMPLEXITY: Low   GOALS:  SHORT TERM GOALS: Target date: 09/08/24.  Ind with an initial HEP. Goal status: INITIAL   LONG TERM GOALS: Target date: 10/06/24.  Ind with an advanced HEP.  Goal status: INITIAL  2.  Active left knee flexion to 120 degrees+ so the patient can perform functional tasks and do so with pain not > 2-3/10.  Goal status: INITIAL  3. Perform a reciprocating stair gait with one railing with pain not > 2-3/10.  Goal status: INITIAL  4.  Improve LEFS score by at least 20 points.  Goal status: INITIAL  5.  Perform ADL's with pain not > 3/10. Goal status: INITIAL  PLAN:  PT FREQUENCY: 2x/week  PT DURATION: 6 weeks  PLANNED INTERVENTIONS: 97110-Therapeutic exercises, 97530- Therapeutic activity, [id number]- Neuromuscular re-education, 97535- Self Care, [id number]- Manual  therapy, [id number]- Electrical stimulation (unattended), 97016- Vasopneumatic device, Patient/Family education, and Cryotherapy  PLAN FOR NEXT SESSION: Nustep, progress to recumbent bike, progress into TKA protocol, vasopneumatic and elevation.     Deysy Schabel, [place], PT 08/25/2024, 11:13 AM

## 2024-08-25 NOTE — Telephone Encounter (Signed)
 Letter mailed

## 2024-08-25 NOTE — Telephone Encounter (Signed)
 [name] Collier NTBS was to have 6 mos FU in July RF sent to mail order pharmacy

## 2024-08-28 ENCOUNTER — Ambulatory Visit: Admitting: Physical Therapy

## 2024-08-28 ENCOUNTER — Other Ambulatory Visit: Payer: Self-pay | Admitting: *Deleted

## 2024-08-28 DIAGNOSIS — M25562 Pain in left knee: Secondary | ICD-10-CM | POA: Diagnosis not present

## 2024-08-28 DIAGNOSIS — R600 Localized edema: Secondary | ICD-10-CM

## 2024-08-28 DIAGNOSIS — M25662 Stiffness of left knee, not elsewhere classified: Secondary | ICD-10-CM

## 2024-08-28 DIAGNOSIS — G8929 Other chronic pain: Secondary | ICD-10-CM

## 2024-08-28 MED ORDER — POTASSIUM CHLORIDE CRYS ER 20 MEQ PO TBCR: 20.0000 meq | EXTENDED_RELEASE_TABLET | Freq: Two times a day (BID) | ORAL | 0 refills | Status: DC

## 2024-08-28 MED ORDER — FAMOTIDINE 20 MG PO TABS: 20.0000 mg | ORAL_TABLET | Freq: Two times a day (BID) | ORAL | 0 refills | Status: DC

## 2024-08-28 NOTE — Therapy (Signed)
 OUTPATIENT PHYSICAL THERAPY LOWER EXTREMITY TREATMENT  Patient Name: [name] Collier MRN: [id number] DOB:22-Apr-1946, 78 y.o., female Today's Date: 08/28/2024  END OF SESSION:  PT End of Session - 08/28/24 1014     Visit Number 2    Number of Visits 12    Date for Recertification  10/06/24    PT Start Time 0848    PT Stop Time 0940    PT Time Calculation (min) 52 min    Activity Tolerance Patient tolerated treatment well    Behavior During Therapy [facility] for tasks assessed/performed          Past Medical History:  Diagnosis Date   Anxiety    Atrial fibrillation (HCC)    Cancer (HCC) 11/2020   left breast IMC   GERD (gastroesophageal reflux disease)    HTN (hypertension)    Hyperlipidemia    Personal history of radiation therapy    TIA (transient ischemic attack)    Past Surgical History:  Procedure Laterality Date   BOTOX  INJECTION N/A 12/22/2018   Procedure: BOTOX  INJECTION INTO ANAL SPHINCTER;  Surgeon: Teresa Lonni HERO, MD;  Location: [facility];  Service: General;  Laterality: N/A;   BREAST BIOPSY Left 11/26/2020   BREAST LUMPECTOMY WITH RADIOACTIVE SEED AND SENTINEL LYMPH NODE BIOPSY Left 01/07/2021   Procedure: LEFT BREAST LUMPECTOMY WITH RADIOACTIVE SEED AND LEFT AXILLARY SENTINEL LYMPH NODE BIOPSY WITH BLUE DYE INJECTION;  Surgeon: Belinda Cough, MD;  Location: [facility];  Service: General;  Laterality: Left;  LMA WITH PECTORAL BLOCK, BLUE DYE INJECTION   CARDIOVERSION N/A 01/04/2018   Procedure: CARDIOVERSION;  Surgeon: Moose Leim DEL, MD;  Location: [facility] ENDOSCOPY;  Service: Cardiovascular;  Laterality: N/A;   CARDIOVERSION N/A 09/20/2018   Procedure: CARDIOVERSION;  Surgeon: Shlomo Wilbert SAUNDERS, MD;  Location: [facility] ENDOSCOPY;  Service: Cardiovascular;  Laterality: N/A;   CARDIOVERSION N/A 10/27/2018   Procedure: CARDIOVERSION;  Surgeon: Lonni Slain, MD;  Location: [facility] ENDOSCOPY;  Service: Cardiovascular;  Laterality: N/A;   KNEE SURGERY Bilateral     Torn miniscus   RECTAL EXAM UNDER ANESTHESIA N/A 12/22/2018   Procedure: ANORECTAL  EXAM UNDER ANESTHESIA;  Surgeon: Teresa Lonni HERO, MD;  Location: [facility];  Service: General;  Laterality: N/A;   RECTOCELE REPAIR     TONSILLECTOMY     VAGINAL HYSTERECTOMY     Patient Active Problem List   Diagnosis Date Noted   Anxiety 05/13/2023   Bilateral lower extremity edema 05/13/2023   Class 3 severe obesity with body mass index (BMI) of 40.0 to 44.9 in adult 05/13/2023   Frailty 05/13/2023   History of Clostridium difficile colitis 05/13/2023   Pulmonary hypertension (HCC) 05/13/2023   History of left breast cancer 05/13/2023   Tricuspid valve insufficiency 05/13/2023   Hypercoagulable state due to persistent atrial fibrillation (HCC) 04/13/2023   Anal pain 02/16/2023   B12 deficiency 02/16/2023   Clostridium difficile colitis 02/16/2023   Dysphagia 02/16/2023   Family history of colon cancer 02/16/2023   Functional diarrhea 02/16/2023   Knee pain, acute 02/16/2023   History of colonic polyps 02/16/2023   Vitamin D  deficiency 02/16/2023   Adenomatous polyp of colon 04/30/2022   Esophageal reflux 04/30/2022   Family history of malignant neoplasm of digestive organs 04/30/2022   Arthralgia of left ankle 04/10/2022   Encounter for monitoring dofetilide  therapy 07/04/2021   Pain in both knees 06/23/2021   Carcinoma of upper-inner quadrant of left breast in female, estrogen receptor positive (HCC) 12/20/2020   Trigger  thumb of left hand 06/28/2019   Depression, major, single episode, mild (HCC) 03/14/2019   Generalized anxiety disorder 03/14/2019   PAF (paroxysmal atrial fibrillation) (HCC)    Osteoarthritis of knee 04/20/2018   Post-menopausal 02/22/2018   Snoring 01/12/2018   Encounter for cardioversion procedure    Persistent atrial fibrillation 11/11/2017   Other fatigue 11/11/2017   Hyperlipemia 04/16/2017   Viral URI 11/27/2016   Weakness of left hand 05/11/2016    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    THERAPY DIAG:  Chronic pain of left knee  Stiffness of left knee, not elsewhere classified  Localized edema  Rationale for Evaluation and Treatment: Rehabilitation  ONSET DATE: 07/26/24 (surgery date).    SUBJECTIVE:   SUBJECTIVE STATEMENT: Knee feels stiff.  PERTINENT HISTORY: Right TKA.   PAIN:  Are you having pain? Yes: NPRS scale: 2/10. Pain location: Left knee Pain description: Sore.   Aggravating factors: increased up time.   Relieving factors: Ice and elevation.    PRECAUTIONS: Other: No ultrasound.    RED FLAGS: None   WEIGHT BEARING RESTRICTIONS: No  FALLS:  Has patient fallen in last 6 months? No  LIVING ENVIRONMENT: Lives with: lives alone Lives in: House/apartment Has following equipment at home: [name] - 2 wheeled  OCCUPATION: Retired.    PLOF: Independent  PATIENT GOALS: Do more with less pain.      OBJECTIVE:   PATIENT SURVEYS:  LEFS:  56/80.    EDEMA:  Circumferential: 6 cms greater on left than right.    PALPATION: Mild tenderness.  Incisional site appears to be healing well.  Some steri-strips remaining.    LOWER EXTREMITY ROM:  Full active left knee extension and active flexion to 90 degrees and passive to 100 degrees.    LOWER EXTREMITY MMT:  Right knee strength 4+/5.      GAIT: Patient has a FWW but has been walking safely without an assistive device with a decrease in step and stride length.                                                                                                                                  TREATMENT DATE: 9/222/25:  Nustep level 2 x 16 minutes moving seat forward x 1 to increase flexion f/b Rockerboard in parallel bars x 4 minutes f/b SAQ's with 3# x 4 minutes f/b Gentle LLLDS  into left knee flexion x 3 minutes f/b Vasopneumatic and elevation x    PATIENT EDUCATION:  Education details:  Person educated:  International aid/development worker:  Education comprehension:   HOME EXERCISE PROGRAM:   ASSESSMENT:  CLINICAL IMPRESSION: Patient did great with treatment today.  She tolerated passive flexion very well and exhibited very good quad activation with SAQ exercise.    OBJECTIVE  IMPAIRMENTS: Abnormal gait, decreased activity tolerance, decreased ROM, increased edema, and pain.   ACTIVITY LIMITATIONS: carrying, lifting, bending, and locomotion level  PARTICIPATION LIMITATIONS: meal prep, cleaning, laundry, community activity, and yard work  [facility] POTENTIAL: Excellent  CLINICAL DECISION MAKING: Stable/uncomplicated  EVALUATION COMPLEXITY: Low   GOALS:  SHORT TERM GOALS: Target date: 09/08/24.  Ind with an initial HEP. Goal status: INITIAL   LONG TERM GOALS: Target date: 10/06/24.  Ind with an advanced HEP.  Goal status: INITIAL  2.  Active left knee flexion to 120 degrees+ so the patient can perform functional tasks and do so with pain not > 2-3/10.  Goal status: INITIAL  3. Perform a reciprocating stair gait with one railing with pain not > 2-3/10.  Goal status: INITIAL  4.  Improve LEFS score by at least 20 points.  Goal status: INITIAL  5.  Perform ADL's with pain not > 3/10. Goal status: INITIAL  PLAN:  PT FREQUENCY: 2x/week  PT DURATION: 6 weeks  PLANNED INTERVENTIONS: 97110-Therapeutic exercises, 97530- Therapeutic activity, [id number]- Neuromuscular re-education, 97535- Self Care, 02859- Manual therapy, [id number]- Electrical stimulation (unattended), 97016- Vasopneumatic device, Patient/Family education, and Cryotherapy  PLAN FOR NEXT SESSION: Nustep, progress to recumbent bike, progress into TKA protocol, vasopneumatic and elevation.     Charley Miske, [place], PT 08/28/2024, 11:10 AM

## 2024-08-28 NOTE — Telephone Encounter (Signed)
 [name] Collier NTBS was to have 6 mos FU in July Letter mailed on 08/25/24 RF sent to mail order pharmacy

## 2024-08-28 NOTE — Addendum Note (Signed)
 Addended by: Norelle Runnion D on: 08/28/2024 02:24 PM   Modules accepted: Orders

## 2024-08-29 ENCOUNTER — Other Ambulatory Visit: Payer: Self-pay | Admitting: *Deleted

## 2024-08-29 DIAGNOSIS — M25562 Pain in left knee: Secondary | ICD-10-CM | POA: Diagnosis not present

## 2024-08-29 DIAGNOSIS — I10 Essential (primary) hypertension: Secondary | ICD-10-CM

## 2024-08-29 DIAGNOSIS — R600 Localized edema: Secondary | ICD-10-CM

## 2024-08-29 MED ORDER — OLMESARTAN MEDOXOMIL 5 MG PO TABS: 5.0000 mg | ORAL_TABLET | Freq: Every day | ORAL | 0 refills | Status: DC

## 2024-08-29 MED ORDER — FUROSEMIDE 20 MG PO TABS: 20.0000 mg | ORAL_TABLET | Freq: Every day | ORAL | 0 refills | Status: DC

## 2024-08-29 NOTE — Addendum Note (Signed)
 Addended by: Preslea Rhodus D on: 08/29/2024 03:08 PM   Modules accepted: Orders

## 2024-08-31 ENCOUNTER — Ambulatory Visit: Admitting: Physical Therapy

## 2024-08-31 ENCOUNTER — Encounter: Payer: Self-pay | Admitting: Physical Therapy

## 2024-08-31 DIAGNOSIS — R600 Localized edema: Secondary | ICD-10-CM

## 2024-08-31 DIAGNOSIS — M25562 Pain in left knee: Secondary | ICD-10-CM | POA: Diagnosis not present

## 2024-08-31 DIAGNOSIS — M25662 Stiffness of left knee, not elsewhere classified: Secondary | ICD-10-CM

## 2024-08-31 DIAGNOSIS — G8929 Other chronic pain: Secondary | ICD-10-CM

## 2024-08-31 NOTE — Therapy (Addendum)
 OUTPATIENT PHYSICAL THERAPY LOWER EXTREMITY TREATMENT  Patient Name: [name] Collier MRN: [id number] DOB:[birth year], 78 y.o., female Today's Date: 08/31/2024  END OF SESSION:  PT End of Session - 08/31/24 1138     Visit Number 3    Number of Visits 12    Date for Recertification  10/06/24    PT Start Time 1100    PT Stop Time 1150    PT Time Calculation (min) 50 min    Activity Tolerance Patient tolerated treatment well    Behavior During Therapy [facility] for tasks assessed/performed          Past Medical History:  Diagnosis Date   Anxiety    Atrial fibrillation (HCC)    Cancer (HCC) 11/2020   left breast IMC   GERD (gastroesophageal reflux disease)    HTN (hypertension)    Hyperlipidemia    Personal history of radiation therapy    TIA (transient ischemic attack)    Past Surgical History:  Procedure Laterality Date   BOTOX  INJECTION N/A 12/22/2018   Procedure: BOTOX  INJECTION INTO ANAL SPHINCTER;  Surgeon: Teresa Lonni HERO, MD;  Location: [facility];  Service: General;  Laterality: N/A;   BREAST BIOPSY Left 11/26/2020   BREAST LUMPECTOMY WITH RADIOACTIVE SEED AND SENTINEL LYMPH NODE BIOPSY Left 01/07/2021   Procedure: LEFT BREAST LUMPECTOMY WITH RADIOACTIVE SEED AND LEFT AXILLARY SENTINEL LYMPH NODE BIOPSY WITH BLUE DYE INJECTION;  Surgeon: Belinda Cough, MD;  Location: [facility];  Service: General;  Laterality: Left;  LMA WITH PECTORAL BLOCK, BLUE DYE INJECTION   CARDIOVERSION N/A 01/04/2018   Procedure: CARDIOVERSION;  Surgeon: Moose Leim DEL, MD;  Location: [facility];  Service: Cardiovascular;  Laterality: N/A;   CARDIOVERSION N/A 09/20/2018   Procedure: CARDIOVERSION;  Surgeon: Shlomo Wilbert SAUNDERS, MD;  Location: [facility];  Service: Cardiovascular;  Laterality: N/A;   CARDIOVERSION N/A 10/27/2018   Procedure: CARDIOVERSION;  Surgeon: Lonni Slain, MD;  Location: [facility] ENDOSCOPY;  Service: Cardiovascular;  Laterality: N/A;   KNEE SURGERY Bilateral     Torn miniscus   RECTAL EXAM UNDER ANESTHESIA N/A 12/22/2018   Procedure: ANORECTAL  EXAM UNDER ANESTHESIA;  Surgeon: Teresa Lonni HERO, MD;  Location: [facility];  Service: General;  Laterality: N/A;   RECTOCELE REPAIR     TONSILLECTOMY     VAGINAL HYSTERECTOMY     Patient Active Problem List   Diagnosis Date Noted   Anxiety 05/13/2023   Bilateral lower extremity edema 05/13/2023   Class 3 severe obesity with body mass index (BMI) of 40.0 to 44.9 in adult 05/13/2023   Frailty 05/13/2023   History of Clostridium difficile colitis 05/13/2023   Pulmonary hypertension (HCC) 05/13/2023   History of left breast cancer 05/13/2023   Tricuspid valve insufficiency 05/13/2023   Hypercoagulable state due to persistent atrial fibrillation (HCC) 04/13/2023   Anal pain 02/16/2023   B12 deficiency 02/16/2023   Clostridium difficile colitis 02/16/2023   Dysphagia 02/16/2023   Family history of colon cancer 02/16/2023   Functional diarrhea 02/16/2023   Knee pain, acute 02/16/2023   History of colonic polyps 02/16/2023   Vitamin D  deficiency 02/16/2023   Adenomatous polyp of colon 04/30/2022   Esophageal reflux 04/30/2022   Family history of malignant neoplasm of digestive organs 04/30/2022   Arthralgia of left ankle 04/10/2022   Encounter for monitoring dofetilide  therapy 07/04/2021   Pain in both knees 06/23/2021   Carcinoma of upper-inner quadrant of left breast in female, estrogen receptor positive (HCC) 12/20/2020   Trigger  thumb of left hand 06/28/2019   Depression, major, single episode, mild 03/14/2019   Generalized anxiety disorder 03/14/2019   PAF (paroxysmal atrial fibrillation) (HCC)    Osteoarthritis of knee 04/20/2018   Post-menopausal 02/22/2018   Snoring 01/12/2018   Encounter for cardioversion procedure    Persistent atrial fibrillation 11/11/2017   Other fatigue 11/11/2017   Hyperlipemia 04/16/2017   Viral URI 11/27/2016   Weakness of left hand 05/11/2016   Anxiety  related tremor 05/11/2016   Essential hypertension    Normochromic normocytic anemia    Lymphadenopathy 04/12/2014    REFERRING PROVIDER: Alm Going MD  REFERRING DIAG: Left total knee replacement.    THERAPY DIAG:  Chronic pain of left knee  Stiffness of left knee, not elsewhere classified  Localized edema  Rationale for Evaluation and Treatment: Rehabilitation  ONSET DATE: 07/26/24 (surgery date).    SUBJECTIVE:   SUBJECTIVE STATEMENT: Back to MD.  Steri-strips off.  Doctor pleased.  Driving now.[name]  PERTINENT HISTORY: Right TKA.   PAIN:  Are you having pain? Yes: NPRS scale: 2/10. Pain location: Left knee Pain description: Sore.   Aggravating factors: increased up time.   Relieving factors: Ice and elevation.    PRECAUTIONS: Other: No ultrasound.    RED FLAGS: None   WEIGHT BEARING RESTRICTIONS: No  FALLS:  Has patient fallen in last 6 months? No  LIVING ENVIRONMENT: Lives with: lives alone Lives in: House/apartment Has following equipment at home: [name] - 2 wheeled  OCCUPATION: Retired.    PLOF: Independent  PATIENT GOALS: Do more with less pain.      OBJECTIVE:   PATIENT SURVEYS:  LEFS:  56/80.    EDEMA:  Circumferential: 6 cms greater on left than right.    PALPATION: Mild tenderness.  Incisional site appears to be healing well.  Some steri-strips remaining.    LOWER EXTREMITY ROM:  Full active left knee extension and active flexion to 90 degrees and passive to 100 degrees.    LOWER EXTREMITY MMT:  Right knee strength 4+/5.      GAIT: Patient has a FWW but has been walking safely without an assistive device with a decrease in step and stride length.                                                                                                                                  TREATMENT DATE:   08/31/24:  Nustep level 3 x 16 minutes moving seat forward x 2 to increase flexion f/b Rockerboard x 4 minutes in parallel bars x 4  minutes f/b SAQ's with 4# x 4 minutes f/b LLLDS into left knee flexion x 3 minutes f/b LE elevation with vasopneumatic on low x 15 minutes.    9/222/25:  Nustep level 2 x 16 minutes moving seat forward x 1 to increase flexion f/b Rockerboard in parallel bars x 4 minutes f/b SAQ's with 3# x 4 minutes f/b Gentle LLLDS  into left knee flexion x 3 minutes f/b Vasopneumatic and elevation x    PATIENT EDUCATION:  Education details: Supine knee flexion stretch. Person educated: Pt. Education method: Demo. Education comprehension: Excellent  HOME EXERCISE PROGRAM: As above.    ASSESSMENT:  CLINICAL IMPRESSION:  Patient is making excellent progress.  Her surgeon was very pleased.  She is now driving.    OBJECTIVE IMPAIRMENTS: Abnormal gait, decreased activity tolerance, decreased ROM, increased edema, and pain.   ACTIVITY LIMITATIONS: carrying, lifting, bending, and locomotion level  PARTICIPATION LIMITATIONS: meal prep, cleaning, laundry, community activity, and yard work  [facility] POTENTIAL: Excellent  CLINICAL DECISION MAKING: Stable/uncomplicated  EVALUATION COMPLEXITY: Low   GOALS:  SHORT TERM GOALS: Target date: 09/08/24.  Ind with an initial HEP. Goal status: INITIAL   LONG TERM GOALS: Target date: 10/06/24.  Ind with an advanced HEP.  Goal status: INITIAL  2.  Active left knee flexion to 120 degrees+ so the patient can perform functional tasks and do so with pain not > 2-3/10.  Goal status: INITIAL  3. Perform a reciprocating stair gait with one railing with pain not > 2-3/10.  Goal status: INITIAL  4.  Improve LEFS score by at least 20 points.  Goal status: INITIAL  5.  Perform ADL's with pain not > 3/10. Goal status: INITIAL  PLAN:  PT FREQUENCY: 2x/week  PT DURATION: 6 weeks  PLANNED INTERVENTIONS: 97110-Therapeutic exercises, 97530- Therapeutic activity, [id number]- Neuromuscular re-education, 97535- Self Care, [id number]- Manual therapy, [id number]- Electrical  stimulation (unattended), 97016- Vasopneumatic device, Patient/Family education, and Cryotherapy  PLAN FOR NEXT SESSION: Nustep, progress to recumbent bike, progress into TKA protocol, vasopneumatic and elevation.     Nyjae Hodge, [place], PT 08/31/2024, 12:07 PM

## 2024-09-01 ENCOUNTER — Encounter: Payer: Self-pay | Admitting: Family Medicine

## 2024-09-01 ENCOUNTER — Ambulatory Visit: Admitting: Family Medicine

## 2024-09-01 VITALS — BP 140/76 | HR 64 | Temp 98.1°F | Ht 65.0 in | Wt 235.1 lb

## 2024-09-01 DIAGNOSIS — E7800 Pure hypercholesterolemia, unspecified: Secondary | ICD-10-CM

## 2024-09-01 DIAGNOSIS — F411 Generalized anxiety disorder: Secondary | ICD-10-CM | POA: Diagnosis not present

## 2024-09-01 DIAGNOSIS — I272 Pulmonary hypertension, unspecified: Secondary | ICD-10-CM | POA: Diagnosis not present

## 2024-09-01 DIAGNOSIS — I10 Essential (primary) hypertension: Secondary | ICD-10-CM | POA: Diagnosis not present

## 2024-09-01 DIAGNOSIS — E559 Vitamin D deficiency, unspecified: Secondary | ICD-10-CM

## 2024-09-01 DIAGNOSIS — F410 Panic disorder [episodic paroxysmal anxiety] without agoraphobia: Secondary | ICD-10-CM | POA: Diagnosis not present

## 2024-09-01 DIAGNOSIS — I4819 Other persistent atrial fibrillation: Secondary | ICD-10-CM | POA: Diagnosis not present

## 2024-09-01 DIAGNOSIS — D6869 Other thrombophilia: Secondary | ICD-10-CM

## 2024-09-01 DIAGNOSIS — Z79899 Other long term (current) drug therapy: Secondary | ICD-10-CM

## 2024-09-01 DIAGNOSIS — I480 Paroxysmal atrial fibrillation: Secondary | ICD-10-CM

## 2024-09-01 MED ORDER — FUROSEMIDE 20 MG PO TABS: 20.0000 mg | ORAL_TABLET | Freq: Every day | ORAL | 3 refills | Status: AC

## 2024-09-01 MED ORDER — POTASSIUM CHLORIDE CRYS ER 20 MEQ PO TBCR: 20.0000 meq | EXTENDED_RELEASE_TABLET | Freq: Two times a day (BID) | ORAL | 3 refills | Status: AC

## 2024-09-01 MED ORDER — APIXABAN 5 MG PO TABS: 5.0000 mg | ORAL_TABLET | Freq: Two times a day (BID) | ORAL | 3 refills | Status: AC

## 2024-09-01 MED ORDER — LORAZEPAM 0.5 MG PO TABS: 0.2500 mg | ORAL_TABLET | Freq: Every day | ORAL | 2 refills | Status: AC | PRN

## 2024-09-01 MED ORDER — FAMOTIDINE 20 MG PO TABS: 20.0000 mg | ORAL_TABLET | Freq: Two times a day (BID) | ORAL | 3 refills | Status: AC | PRN

## 2024-09-01 MED ORDER — EZETIMIBE 10 MG PO TABS: 10.0000 mg | ORAL_TABLET | Freq: Every day | ORAL | 3 refills | Status: AC

## 2024-09-01 MED ORDER — AMLODIPINE BESYLATE 2.5 MG PO TABS: 2.5000 mg | ORAL_TABLET | Freq: Every day | ORAL | 3 refills | Status: AC

## 2024-09-01 MED ORDER — OLMESARTAN MEDOXOMIL 5 MG PO TABS: 5.0000 mg | ORAL_TABLET | Freq: Every day | ORAL | 3 refills | Status: AC

## 2024-09-01 NOTE — Progress Notes (Signed)
 Subjective: RR:ryzrx up PCP: Jolinda Norene HERO, DO YEP:[name] is a 78 y.o. female presenting to clinic today for:  She reports that she is doing fairly well.  Denies any atrial fibrillation runs.  No bleeding with Eliquis .  Compliant with all medications.  No chest pain, shortness of breath.  She did have some edema in the left lower extremity but notes that she had a total knee done on that left side.  Continues to work with physical therapy twice per week and sees her orthopedics again in 6 weeks.  She had an area of cellulitis in that left lower extremity but that has been successfully treated with oral antibiotics.  They are currently working on getting swelling down in that leg.  She notes that she has been having some episodes of dizziness, particularly when she wakes up in the morning.  Denies any vertiginous symptoms or any syncope or presyncopal episodes but she feels like she could fall out.  She actually has to use her cane sometimes for stability due to these intermittent dizziness.  She has not checked her blood pressure because her batteries have been corroded and she has not been able to do so.  Uses her Ativan  sparingly with last use a few days ago when she was having elevated blood pressures due to panic at a physical therapy appointment.  Her blood pressure was reportedly systolics of 170s to 190s at that visit   ROS: Per HPI  Allergies  Allergen Reactions   Augmentin [Amoxicillin-Pot Clavulanate] Other (See Comments)    c-diff Has patient had a PCN reaction causing immediate rash, facial/tongue/throat swelling, SOB or lightheadedness with hypotension: No Has patient had a PCN reaction causing severe rash involving mucus membranes or skin necrosis: No Has patient had a PCN reaction that required hospitalization: No Has patient had a PCN reaction occurring within the last 10 years: Yes If all of the above answers are NO, then may proceed with Cephalosporin use.     Codeine Palpitations and Rash   Prednisone  Other (See Comments)    Jittery, red in the face   Celecoxib Other (See Comments)   Past Medical History:  Diagnosis Date   Anxiety    Atrial fibrillation (HCC)    Cancer (HCC) 11/2020   left breast [facility]   Frailty 05/13/2023   GERD (gastroesophageal reflux disease)    HTN (hypertension)    Hyperlipidemia    Personal history of radiation therapy    TIA (transient ischemic attack)     Current Outpatient Medications:    acetaminophen  (TYLENOL ) 500 MG tablet, Take 1,000 mg by mouth as needed for moderate pain (pain score 4-6) or headache., Disp: , Rfl:    amLODipine  (NORVASC ) 2.5 MG tablet, Take 1 tablet (2.5 mg total) by mouth daily. *Note dose change, Disp: 90 tablet, Rfl: 3   anastrozole  (ARIMIDEX ) 1 MG tablet, Take 1 tablet (1 mg total) by mouth daily., Disp: 90 tablet, Rfl: 2   apixaban  (ELIQUIS ) 5 MG TABS tablet, Take 1 tablet (5 mg total) by mouth 2 (two) times daily., Disp: 28 tablet, Rfl:    Cholecalciferol  (VITAMIN D3) 5000 units CAPS, Take 5,000 Units by mouth daily with supper., Disp: , Rfl:    dofetilide  (TIKOSYN ) 500 MCG capsule, Take 1 capsule (500 mcg total) by mouth 2 (two) times daily., Disp: 180 capsule, Rfl: 2   ezetimibe  (ZETIA ) 10 MG tablet, Take 1 tablet (10 mg total) by mouth daily., Disp: 90 tablet, Rfl: 0  famotidine  (PEPCID ) 20 MG tablet, Take 1 tablet (20 mg total) by mouth 2 (two) times daily., Disp: 180 tablet, Rfl: 0   furosemide  (LASIX ) 20 MG tablet, Take 1 tablet (20 mg total) by mouth daily., Disp: 90 tablet, Rfl: 0   LORazepam  (ATIVAN ) 0.5 MG tablet, Take 0.5-1 tablets (0.25-0.5 mg total) by mouth daily as needed for anxiety (put on hold)., Disp: 20 tablet, Rfl: 2   nystatin  cream (MYCOSTATIN ), Apply 1 Application topically 2 (two) times daily. X5-10 days as needed for rash in groin area, Disp: 30 g, Rfl: 2   olmesartan  (BENICAR ) 5 MG tablet, Take 1 tablet (5 mg total) by mouth daily., Disp: 90 tablet, Rfl:  0   potassium chloride  SA (KLOR-CON  M) 20 MEQ tablet, Take 1 tablet (20 mEq total) by mouth 2 (two) times daily., Disp: 180 tablet, Rfl: 0 Social History   Socioeconomic History   Marital status: Widowed    Spouse name: Not on file   Number of children: 2   Years of education: 79   Highest education level: 12th grade  Occupational History   Occupation: Retired    Comment: Geophysical data processor  Tobacco Use   Smoking status: Never   Smokeless tobacco: Never   Tobacco comments:    Never smoke 06/30/22  Vaping Use   Vaping status: Never Used  Substance and Sexual Activity   Alcohol use: No    Alcohol/week: 0.0 standard drinks of alcohol   Drug use: No   Sexual activity: Not Currently    Birth control/protection: Post-menopausal  Other Topics Concern   Not on file  Social History Narrative   Lives alone - children in [place] and [place] -    Caffeine use: Soda/tea daily   Good church support group   Social Drivers of Health   Financial Resource Strain: Low Risk  (08/28/2024)   Overall Financial Resource Strain (CARDIA)    Difficulty of Paying Living Expenses: Not hard at all  Food Insecurity: No Food Insecurity (08/28/2024)   Hunger Vital Sign    Worried About Running Out of Food in the Last Year: Never true    Ran Out of Food in the Last Year: Never true  Transportation Needs: No Transportation Needs (08/28/2024)   PRAPARE - Administrator, Civil Service (Medical): No    Lack of Transportation (Non-Medical): No  Physical Activity: Unknown (08/28/2024)   Exercise Vital Sign    Days of Exercise per Week: Patient declined    Minutes of Exercise per Session: Not on file  Stress: Patient Declined (08/28/2024)   [employer] of Occupational Health - Occupational Stress Questionnaire    Feeling of Stress: Patient declined  Social Connections: Moderately Integrated (08/28/2024)   Social Connection and Isolation Panel    Frequency of Communication with Friends and  Family: More than three times a week    Frequency of Social Gatherings with Friends and Family: More than three times a week    Attends Religious Services: More than 4 times per year    Active Member of [employer] or Organizations: Yes    Attends Banker Meetings: More than 4 times per year    Marital Status: Widowed  Intimate Partner Violence: Not At Risk (07/26/2024)   Received from [facility]   HITS    Over the last 12 months how often did your partner physically hurt you?: Never    Over the last 12 months how often did your partner insult you or talk  down to you?: Never    Over the last 12 months how often did your partner threaten you with physical harm?: Never    Over the last 12 months how often did your partner scream or curse at you?: Never   Family History  Problem Relation Age of Onset   Hypertension Mother    Hyperlipidemia Mother    Neurologic Disorder Mother 77       GB   Stroke Son    Stroke Maternal Uncle    Stroke Grandchild    Prostate cancer Brother     Objective: Office vital signs reviewed. BP 138/72   Pulse (!) 58   Temp 98.1 F (36.7 C)   Ht 5' 5 (1.651 m)   Wt 235 lb 2 oz (106.7 kg)   SpO2 92%   BMI 39.13 kg/m   Physical Examination:  General: Awake, alert, morbidly obese, No acute distress HEENT: Sclera white.  Moist mucous membranes Cardio: regular rate and rhythm, S1S2 heard, + murmurs appreciated Pulm: clear to auscultation bilaterally, no wheezes, rhonchi or rales; normal work of breathing on room air MSK: Postsurgical changes to bilateral knees, some dependent edema present but mild.  Venous stasis changes to the skin bilaterally  Orthostatic Vitals for the past 48 hrs (Last 6 readings):  Patient Position Orthostatic BP Orthostatic Pulse BP Pulse  09/01/24 1117 -- -- -- 138/72 (!) 58  09/01/24 1224 Supine 135/73 61 -- --  09/01/24 1225 Sitting -- -- (!) 140/76 64  09/01/24 1227 Standing 132/76 68 -- --     Assessment/  Plan: 78 y.o. female   Essential hypertension - Plan: CMP14+EGFR, amLODipine  (NORVASC ) 2.5 MG tablet, furosemide  (LASIX ) 20 MG tablet, olmesartan  (BENICAR ) 5 MG tablet, potassium chloride  SA (KLOR-CON  M) 20 MEQ tablet  Pure hypercholesterolemia - Plan: CMP14+EGFR, Lipid Panel, ezetimibe  (ZETIA ) 10 MG tablet  PAF (paroxysmal atrial fibrillation) (HCC) - Plan: CMP14+EGFR, apixaban  (ELIQUIS ) 5 MG TABS tablet  Hypercoagulable state due to persistent atrial fibrillation (HCC) - Plan: CMP14+EGFR  Pulmonary hypertension (HCC) - Plan: CMP14+EGFR  Vitamin D  deficiency - Plan: CMP14+EGFR, VITAMIN D  25 Hydroxy (Vit-D Deficiency, Fractures)  Controlled substance agreement signed - Plan: ToxASSURE Select 13 (MW), Urine  Generalized anxiety disorder with panic attacks - Plan: LORazepam  (ATIVAN ) 0.5 MG tablet   Blood pressure well-controlled.  Orthostatics negative.  However, I do question if maybe she is having orthostasis in the morning times so have asked that she check her blood pressure.  Could consider elimination versus reduction of maybe the Benicar .  Seem to be both rate and rhythm controlled today.  Lipid panel collected along with CMP.  Medications have been renewed  CBC from 1 month ago reviewed and demonstrated no anemia  Work of breathing on room air was normal.  Her pulmonary exam was unremarkable  Check vitamin D  level although we did not discuss vitamin D  in detail except that she is taking a supplement  Anxiety disorder is chronic and stable and UDS and CSA were updated as per office policy.  The national narcotic database reviewed and there were no red flags.  Last use over 3 days ago   Norene CHRISTELLA Fielding, DO [facility] [phone]

## 2024-09-02 LAB — CMP14+EGFR
ALT: 13 IU/L (ref 0–32)
AST: 21 IU/L (ref 0–40)
Albumin: 4 g/dL (ref 3.8–4.8)
Alkaline Phosphatase: 88 IU/L (ref 49–135)
BUN/Creatinine Ratio: 21 (ref 12–28)
BUN: 16 mg/dL (ref 8–27)
Bilirubin Total: 1.2 mg/dL (ref 0.0–1.2)
CO2: 23 mmol/L (ref 20–29)
Calcium: 9.7 mg/dL (ref 8.7–10.3)
Chloride: 104 mmol/L (ref 96–106)
Creatinine, Ser: 0.75 mg/dL (ref 0.57–1.00)
Globulin, Total: 3.1 g/dL (ref 1.5–4.5)
Glucose: 95 mg/dL (ref 70–99)
Potassium: 4.8 mmol/L (ref 3.5–5.2)
Sodium: 142 mmol/L (ref 134–144)
Total Protein: 7.1 g/dL (ref 6.0–8.5)
eGFR: 81 mL/min/1.73 (ref >59)

## 2024-09-02 LAB — LIPID PANEL
Chol/HDL Ratio: 2.8 ratio (ref 0.0–4.4)
Cholesterol, Total: 188 mg/dL (ref 100–199)
HDL: 66 mg/dL (ref >39)
LDL Chol Calc (NIH): 100 mg/dL — ABNORMAL HIGH (ref 0–99)
Triglycerides: 126 mg/dL (ref 0–149)
VLDL Cholesterol Cal: 22 mg/dL (ref 5–40)

## 2024-09-02 LAB — VITAMIN D 25 HYDROXY (VIT D DEFICIENCY, FRACTURES): Vit D, 25-Hydroxy: 56.3 ng/mL (ref 30.0–100.0)

## 2024-09-04 ENCOUNTER — Ambulatory Visit: Payer: Self-pay | Admitting: Family Medicine

## 2024-09-04 ENCOUNTER — Ambulatory Visit: Admitting: *Deleted

## 2024-09-04 DIAGNOSIS — M25562 Pain in left knee: Secondary | ICD-10-CM | POA: Diagnosis not present

## 2024-09-04 DIAGNOSIS — G8929 Other chronic pain: Secondary | ICD-10-CM

## 2024-09-04 DIAGNOSIS — M25662 Stiffness of left knee, not elsewhere classified: Secondary | ICD-10-CM

## 2024-09-04 DIAGNOSIS — R600 Localized edema: Secondary | ICD-10-CM

## 2024-09-04 NOTE — Therapy (Signed)
 OUTPATIENT PHYSICAL THERAPY LOWER EXTREMITY TREATMENT  Patient Name: [name] Collier MRN: [id number] DOB:1946-05-29, 78 y.o., female Today's Date: 09/04/2024  END OF SESSION:  PT End of Session - 09/04/24 1027     Visit Number 4    Number of Visits 12    Date for Recertification  10/06/24    PT Start Time 1015    PT Stop Time 1121    PT Time Calculation (min) 66 min          Past Medical History:  Diagnosis Date   Anxiety    Atrial fibrillation (HCC)    Cancer (HCC) 11/2020   left breast [facility]   Frailty 05/13/2023   GERD (gastroesophageal reflux disease)    HTN (hypertension)    Hyperlipidemia    Personal history of radiation therapy    TIA (transient ischemic attack)    Past Surgical History:  Procedure Laterality Date   BOTOX  INJECTION N/A 12/22/2018   Procedure: BOTOX  INJECTION INTO ANAL SPHINCTER;  Surgeon: [name] [name] HERO, MD;  Location: [facility];  Service: General;  Laterality: N/A;   BREAST BIOPSY Left 11/26/2020   BREAST LUMPECTOMY WITH RADIOACTIVE SEED AND SENTINEL LYMPH NODE BIOPSY Left 01/07/2021   Procedure: LEFT BREAST LUMPECTOMY WITH RADIOACTIVE SEED AND LEFT AXILLARY SENTINEL LYMPH NODE BIOPSY WITH BLUE DYE INJECTION;  Surgeon: [name] Cough, MD;  Location: [facility];  Service: General;  Laterality: Left;  LMA WITH PECTORAL BLOCK, BLUE DYE INJECTION   CARDIOVERSION N/A 01/04/2018   Procedure: CARDIOVERSION;  Surgeon: [name] Leim DEL, MD;  Location: [facility] ENDOSCOPY;  Service: Cardiovascular;  Laterality: N/A;   CARDIOVERSION N/A 09/20/2018   Procedure: CARDIOVERSION;  Surgeon: [name] Wilbert SAUNDERS, MD;  Location: [facility] ENDOSCOPY;  Service: Cardiovascular;  Laterality: N/A;   CARDIOVERSION N/A 10/27/2018   Procedure: CARDIOVERSION;  Surgeon: [name] Slain, MD;  Location: [facility] ENDOSCOPY;  Service: Cardiovascular;  Laterality: N/A;   KNEE SURGERY Bilateral    Torn miniscus   RECTAL EXAM UNDER ANESTHESIA N/A 12/22/2018   Procedure: ANORECTAL   EXAM UNDER ANESTHESIA;  Surgeon: [name] [name] HERO, MD;  Location: [facility];  Service: General;  Laterality: N/A;   RECTOCELE REPAIR     TONSILLECTOMY     TOTAL KNEE ARTHROPLASTY Left 07/26/2024   TOTAL KNEE ARTHROPLASTY Right 05/28/2023   VAGINAL HYSTERECTOMY     Patient Active Problem List   Diagnosis Date Noted   Anxiety 05/13/2023   Bilateral lower extremity edema 05/13/2023   Class 3 severe obesity with body mass index (BMI) of 40.0 to 44.9 in adult 05/13/2023   Pulmonary hypertension (HCC) 05/13/2023   History of left breast cancer 05/13/2023   Tricuspid valve insufficiency 05/13/2023   Hypercoagulable state due to persistent atrial fibrillation (HCC) 04/13/2023   Anal pain 02/16/2023   B12 deficiency 02/16/2023   Dysphagia 02/16/2023   Family history of colon cancer 02/16/2023   Functional diarrhea 02/16/2023   Knee pain, acute 02/16/2023   Vitamin D  deficiency 02/16/2023   Adenomatous polyp of colon 04/30/2022   Esophageal reflux 04/30/2022   Family history of malignant neoplasm of digestive organs 04/30/2022   Arthralgia of left ankle 04/10/2022   Encounter for monitoring dofetilide  therapy 07/04/2021   Pain in both knees 06/23/2021   Carcinoma of upper-inner quadrant of left breast in female, estrogen receptor positive (HCC) 12/20/2020   Trigger thumb of left hand 06/28/2019   Depression, major, single episode, mild 03/14/2019   Generalized anxiety disorder 03/14/2019   PAF (paroxysmal atrial fibrillation) (HCC)  Osteoarthritis of knee 04/20/2018   Post-menopausal 02/22/2018   Snoring 01/12/2018   Encounter for cardioversion procedure    Persistent atrial fibrillation 11/11/2017   Hyperlipemia 04/16/2017   Weakness of left hand 05/11/2016   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    THERAPY  DIAG:  Chronic pain of left knee  Stiffness of left knee, not elsewhere classified  Localized edema  Rationale for Evaluation and Treatment: Rehabilitation  ONSET DATE: 07/26/24 (surgery date).    SUBJECTIVE:   SUBJECTIVE STATEMENT: Back to MD.  Steri-strips off.  Doctor pleased.  Driving now.[name] Collier  PERTINENT HISTORY: Right TKA.   PAIN:  Are you having pain? Yes: NPRS scale: 2/10. Pain location: Left knee Pain description: Sore.   Aggravating factors: increased up time.   Relieving factors: Ice and elevation.    PRECAUTIONS: Other: No ultrasound.    RED FLAGS: None   WEIGHT BEARING RESTRICTIONS: No  FALLS:  Has patient fallen in last 6 months? No  LIVING ENVIRONMENT: Lives with: lives alone Lives in: House/apartment Has following equipment at home: [name] - 2 wheeled  OCCUPATION: Retired.    PLOF: Independent  PATIENT GOALS: Do more with less pain.      OBJECTIVE:   PATIENT SURVEYS:  LEFS:  56/80.    EDEMA:  Circumferential: 6 cms greater on left than right.    PALPATION: Mild tenderness.  Incisional site appears to be healing well.  Some steri-strips remaining.    LOWER EXTREMITY ROM:  Full active left knee extension and active flexion to 90 degrees and passive to 100 degrees.    LOWER EXTREMITY MMT:  Right knee strength 4+/5.      GAIT: Patient has a FWW but has been walking safely without an assistive device with a decrease in step and stride length.                                                                                                                                  TREATMENT DATE:   08/31/24:  Nustep level 3 x 16 minutes moving seat forward x 2 to increase flexion  Rockerboard x 5 minutes in parallel bars 14in box lunges x 4 mins    LAQ's with 4#  3x 10 pause top   HS curl green band  3x10     LE elevation with vasopneumatic on low x 15 minutes.      9/222/25:  Nustep level 2 x 16 minutes moving seat forward x 1 to  increase flexion f/b Rockerboard in parallel bars x 4 minutes f/b SAQ's with 3# x 4 minutes f/b Gentle LLLDS into left knee flexion x 3 minutes f/b Vasopneumatic and elevation x    PATIENT EDUCATION:  Education details: Supine knee flexion stretch. Person educated: Pt. Education  method: Demo. Education comprehension: Excellent  HOME EXERCISE PROGRAM: As above.    ASSESSMENT:  CLINICAL IMPRESSION:  Patient arrived  today doing fairly well with LT knee. Rx focused on ROM progression as well as LE strengthening and balance. Improved quad control today as well as gait with no AD. Vaso end of session    OBJECTIVE IMPAIRMENTS: Abnormal gait, decreased activity tolerance, decreased ROM, increased edema, and pain.   ACTIVITY LIMITATIONS: carrying, lifting, bending, and locomotion level  PARTICIPATION LIMITATIONS: meal prep, cleaning, laundry, community activity, and yard work  [facility] POTENTIAL: Excellent  CLINICAL DECISION MAKING: Stable/uncomplicated  EVALUATION COMPLEXITY: Low   GOALS:  SHORT TERM GOALS: Target date: 09/08/24.  Ind with an initial HEP. Goal status: MET   LONG TERM GOALS: Target date: 10/06/24.  Ind with an advanced HEP.  Goal status: INITIAL  2.  Active left knee flexion to 120 degrees+ so the patient can perform functional tasks and do so with pain not > 2-3/10.  Goal status: INITIAL  3. Perform a reciprocating stair gait with one railing with pain not > 2-3/10.  Goal status: INITIAL  4.  Improve LEFS score by at least 20 points.  Goal status: INITIAL  5.  Perform ADL's with pain not > 3/10. Goal status: INITIAL  PLAN:  PT FREQUENCY: 2x/week  PT DURATION: 6 weeks  PLANNED INTERVENTIONS: 97110-Therapeutic exercises, 97530- Therapeutic activity, [id number]- Neuromuscular re-education, 97535- Self Care, [id number]- Manual therapy, [id number]- Electrical stimulation (unattended), 97016- Vasopneumatic device, Patient/Family education, and Cryotherapy  PLAN FOR  NEXT SESSION: Nustep, progress to recumbent bike, progress into TKA protocol, vasopneumatic and elevation.     Keldon Lassen,CHRIS, PTA 09/04/2024, 11:29 AM

## 2024-09-06 LAB — TOXASSURE SELECT 13 (MW), URINE

## 2024-09-07 ENCOUNTER — Ambulatory Visit: Attending: Orthopedic Surgery

## 2024-09-07 DIAGNOSIS — R600 Localized edema: Secondary | ICD-10-CM | POA: Diagnosis not present

## 2024-09-07 DIAGNOSIS — M25662 Stiffness of left knee, not elsewhere classified: Secondary | ICD-10-CM | POA: Insufficient documentation

## 2024-09-07 DIAGNOSIS — M25562 Pain in left knee: Secondary | ICD-10-CM | POA: Diagnosis not present

## 2024-09-07 DIAGNOSIS — G8929 Other chronic pain: Secondary | ICD-10-CM | POA: Diagnosis not present

## 2024-09-07 NOTE — Therapy (Signed)
 OUTPATIENT PHYSICAL THERAPY LOWER EXTREMITY TREATMENT  Patient Name: [name] Collier MRN: [id number] DOB:05/25/1946, 78 y.o., female Today's Date: 09/07/2024  END OF SESSION:  PT End of Session - 09/07/24 1347     Visit Number 5    Number of Visits 12    Date for Recertification  10/06/24    PT Start Time 1345    PT Stop Time 1455    PT Time Calculation (min) 70 min          Past Medical History:  Diagnosis Date   Anxiety    Atrial fibrillation (HCC)    Cancer (HCC) 11/2020   left breast [facility]   Frailty 05/13/2023   GERD (gastroesophageal reflux disease)    HTN (hypertension)    Hyperlipidemia    Personal history of radiation therapy    TIA (transient ischemic attack)    Past Surgical History:  Procedure Laterality Date   BOTOX  INJECTION N/A 12/22/2018   Procedure: BOTOX  INJECTION INTO ANAL SPHINCTER;  Surgeon: Teresa Lonni HERO, MD;  Location: [facility];  Service: General;  Laterality: N/A;   BREAST BIOPSY Left 11/26/2020   BREAST LUMPECTOMY WITH RADIOACTIVE SEED AND SENTINEL LYMPH NODE BIOPSY Left 01/07/2021   Procedure: LEFT BREAST LUMPECTOMY WITH RADIOACTIVE SEED AND LEFT AXILLARY SENTINEL LYMPH NODE BIOPSY WITH BLUE DYE INJECTION;  Surgeon: Belinda Cough, MD;  Location: [facility];  Service: General;  Laterality: Left;  LMA WITH PECTORAL BLOCK, BLUE DYE INJECTION   CARDIOVERSION N/A 01/04/2018   Procedure: CARDIOVERSION;  Surgeon: Moose Leim DEL, MD;  Location: [facility] END[facility]COPY;  Service: Cardiovascular;  Laterality: N/A;   CARDIOVERSION N/A 09/20/2018   Procedure: CARDIOVERSION;  Surgeon: Shlomo Wilbert SAUNDERS, MD;  Location: [facility];  Service: Cardiovascular;  Laterality: N/A;   CARDIOVERSION N/A 10/27/2018   Procedure: CARDIOVERSION;  Surgeon: Lonni Slain, MD;  Location: [facility] ENDOSCOPY;  Service: Cardiovascular;  Laterality: N/A;   KNEE SURGERY Bilateral    Torn miniscus   RECTAL EXAM UNDER ANESTHESIA N/A 12/22/2018   Procedure: ANORECTAL   EXAM UNDER ANESTHESIA;  Surgeon: Teresa Lonni HERO, MD;  Location: [facility];  Service: General;  Laterality: N/A;   RECTOCELE REPAIR     TONSILLECTOMY     TOTAL KNEE ARTHROPLASTY Left 07/26/2024   TOTAL KNEE ARTHROPLASTY Right 05/28/2023   VAGINAL HYSTERECTOMY     Patient Active Problem List   Diagnosis Date Noted   Anxiety 05/13/2023   Bilateral lower extremity edema 05/13/2023   Class 3 severe obesity with body mass index (BMI) of 40.0 to 44.9 in adult [facility]) 05/13/2023   Pulmonary hypertension (HCC) 05/13/2023   History of left breast cancer 05/13/2023   Tricuspid valve insufficiency 05/13/2023   Hypercoagulable state due to persistent atrial fibrillation (HCC) 04/13/2023   Anal pain 02/16/2023   B12 deficiency 02/16/2023   Dysphagia 02/16/2023   Family history of colon cancer 02/16/2023   Functional diarrhea 02/16/2023   Knee pain, acute 02/16/2023   Vitamin D  deficiency 02/16/2023   Adenomatous polyp of colon 04/30/2022   Esophageal reflux 04/30/2022   Family history of malignant neoplasm of digestive organs 04/30/2022   Arthralgia of left ankle 04/10/2022   Encounter for monitoring dofetilide  therapy 07/04/2021   Pain in both knees 06/23/2021   Carcinoma of upper-inner quadrant of left breast in female, estrogen receptor positive (HCC) 12/20/2020   Trigger thumb of left hand 06/28/2019   Depression, major, single episode, mild 03/14/2019   Generalized anxiety disorder 03/14/2019   PAF (paroxysmal atrial fibrillation) (  HCC)    Osteoarthritis of knee 04/20/2018   Post-menopausal 02/22/2018   Snoring 01/12/2018   Encounter for cardioversion procedure    Persistent atrial fibrillation 11/11/2017   Hyperlipemia 04/16/2017   Weakness of left hand 05/11/2016   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     THERAPY DIAG:  Chronic pain of left knee  Stiffness of left knee, not elsewhere classified  Localized edema  Rationale for Evaluation and Treatment: Rehabilitation  ONSET DATE: 07/26/24 (surgery date).    SUBJECTIVE:   SUBJECTIVE STATEMENT: Back to MD.  Steri-strips off.  Doctor pleased.  Driving now.[name]  PERTINENT HISTORY: Right TKA.   PAIN:  Are you having pain? Yes: NPRS scale: 2/10. Pain location: Left knee Pain description: Sore.   Aggravating factors: increased up time.   Relieving factors: Ice and elevation.    PRECAUTIONS: Other: No ultrasound.    RED FLAGS: None   WEIGHT BEARING RESTRICTIONS: No  FALLS:  Has patient fallen in last 6 months? No  LIVING ENVIRONMENT: Lives with: lives alone Lives in: House/apartment Has following equipment at home: [name] - 2 wheeled  OCCUPATION: Retired.    PLOF: Independent  PATIENT GOALS: Do more with less pain.      OBJECTIVE:   PATIENT SURVEYS:  LEFS:  56/80.    EDEMA:  Circumferential: 6 cms greater on left than right.    PALPATION: Mild tenderness.  Incisional site appears to be healing well.  Some steri-strips remaining.    LOWER EXTREMITY ROM:  Full active left knee extension and active flexion to 90 degrees and passive to 100 degrees.    LOWER EXTREMITY MMT:  Right knee strength 4+/5.   GAIT: Patient has a FWW but has been walking safely without an assistive device with a decrease in step and stride length.                                                                                                                                TREATMENT DATE:   09/07/24                                  EXERCISE LOG  Exercise Repetitions and Resistance Comments  Nustep Lvl 3 x 15 mins; seat 8   Rockerboard 4 mins   Lunges 14 box x 4 mins   Forward Step Ups    LAQs 4# 2 sets of 15 reps   Seated Marches  4# 2 sets of 15 reps   Goal Assessment  See Below    Blank cell = exercise not performed today    Modalities  Date:  Vaso: Knee, 34 degrees; low pressure, 15 mins, Pain and Edema   08/31/24:  Nustep level  3 x 16 minutes moving seat forward x 2 to increase flexion  Rockerboard x 5 minutes in parallel bars 14in box lunges x 4 mins    LAQ's with 4#  3x 10 pause top   HS curl green band  3x10     LE elevation with vasopneumatic on low x 15 minutes.    9/222/25:  Nustep level 2 x 16 minutes moving seat forward x 1 to increase flexion f/b Rockerboard in parallel bars x 4 minutes f/b SAQ's with 3# x 4 minutes f/b Gentle LLLDS into left knee flexion x 3 minutes f/b Vasopneumatic and elevation x    PATIENT EDUCATION:  Education details: Supine knee flexion stretch. Person educated: Pt. Education method: Demo. Education comprehension: Excellent  HOME EXERCISE PROGRAM: As above.    ASSESSMENT:  CLINICAL IMPRESSION: Pt arrives for today's treatment session reporting 1-2/10 left knee pain.  Pt states that her left knee felt like it wanted to give out on her a few times today.  Pt able to increase LEFS score to 62/80 and demonstrate 110 degrees of left knee flexion, making good progress towards her goals at this time.  Pt able to tolerate increased reps and time with all previously performed exercises.  Normal responses to vaso noted upon removal.  Pt denied any pain at completion of today's treatment session.  OBJECTIVE IMPAIRMENTS: Abnormal gait, decreased activity tolerance, decreased ROM, increased edema, and pain.   ACTIVITY LIMITATIONS: carrying, lifting, bending, and locomotion level  PARTICIPATION LIMITATIONS: meal prep, cleaning, laundry, community activity, and yard work  [facility] POTENTIAL: Excellent  CLINICAL DECISION MAKING: Stable/uncomplicated  EVALUATION COMPLEXITY: Low   GOALS:  SHORT TERM GOALS: Target date: 09/08/24.  Ind with an initial HEP. Goal status: MET   LONG TERM GOALS: Target date: 10/06/24.  Ind with an advanced HEP.  Goal status: IN  PROGRESS  2.  Active left knee flexion to 120 degrees+ so the patient can perform functional tasks and do so with pain not > 2-3/10.   10/2: 110 degrees Goal status: IN PROGRESS  3. Perform a reciprocating stair gait with one railing with pain not > 2-3/10.  Goal status: IN PROGRESS  4.  Improve LEFS score by at least 20 points.   10/2: 62/80 Goal status: IN PROGRESS  5.  Perform ADL's with pain not > 3/10.  10/2: average 2/10 Goal status: MET  PLAN:  PT FREQUENCY: 2x/week  PT DURATION: 6 weeks  PLANNED INTERVENTIONS: 97110-Therapeutic exercises, 97530- Therapeutic activity, 97112- Neuromuscular re-education, 97535- Self Care, 02859- Manual therapy, [id number]- Electrical stimulation (unattended), 97016- Vasopneumatic device, Patient/Family education, and Cryotherapy  PLAN FOR NEXT SESSION: Nustep, progress to recumbent bike, progress into TKA protocol, vasopneumatic and elevation.     Delon DELENA Gosling, PTA 09/07/2024, 3:56 PM

## 2024-09-11 ENCOUNTER — Encounter: Payer: Self-pay | Admitting: *Deleted

## 2024-09-11 ENCOUNTER — Ambulatory Visit: Admitting: *Deleted

## 2024-09-11 DIAGNOSIS — M25562 Pain in left knee: Secondary | ICD-10-CM | POA: Diagnosis not present

## 2024-09-11 DIAGNOSIS — G8929 Other chronic pain: Secondary | ICD-10-CM

## 2024-09-11 DIAGNOSIS — M25662 Stiffness of left knee, not elsewhere classified: Secondary | ICD-10-CM

## 2024-09-11 DIAGNOSIS — R600 Localized edema: Secondary | ICD-10-CM

## 2024-09-11 NOTE — Therapy (Signed)
 OUTPATIENT PHYSICAL THERAPY LOWER EXTREMITY TREATMENT  Patient Name: [name] Collier MRN: [id number] DOB:Apr 24, 1946, 78 y.o., female Today's Date: 09/11/2024  END OF SESSION:  PT End of Session - 09/11/24 1340     Visit Number 6    Number of Visits 12    Date for Recertification  10/06/24    PT Start Time 0845    PT Stop Time 0946    PT Time Calculation (min) 61 min           Past Medical History:  Diagnosis Date   Anxiety    Atrial fibrillation (HCC)    Cancer (HCC) 11/2020   left breast [facility]   Frailty 05/13/2023   GERD (gastroesophageal reflux disease)    HTN (hypertension)    Hyperlipidemia    Personal history of radiation therapy    TIA (transient ischemic attack)    Past Surgical History:  Procedure Laterality Date   BOTOX  INJECTION N/A 12/22/2018   Procedure: BOTOX  INJECTION INTO ANAL SPHINCTER;  Surgeon: Teresa Lonni HERO, MD;  Location: [facility];  Service: General;  Laterality: N/A;   BREAST BIOPSY Left 11/26/2020   BREAST LUMPECTOMY WITH RADIOACTIVE SEED AND SENTINEL LYMPH NODE BIOPSY Left 01/07/2021   Procedure: LEFT BREAST LUMPECTOMY WITH RADIOACTIVE SEED AND LEFT AXILLARY SENTINEL LYMPH NODE BIOPSY WITH BLUE DYE INJECTION;  Surgeon: Belinda Cough, MD;  Location: [facility];  Service: General;  Laterality: Left;  LMA WITH PECTORAL BLOCK, BLUE DYE INJECTION   CARDIOVERSION N/A 01/04/2018   Procedure: CARDIOVERSION;  Surgeon: Moose Leim DEL, MD;  Location: [facility] ENDOSCOPY;  Service: Cardiovascular;  Laterality: N/A;   CARDIOVERSION N/A 09/20/2018   Procedure: CARDIOVERSION;  Surgeon: Shlomo Wilbert SAUNDERS, MD;  Location: [facility] ENDOSCOPY;  Service: Cardiovascular;  Laterality: N/A;   CARDIOVERSION N/A 10/27/2018   Procedure: CARDIOVERSION;  Surgeon: Lonni Slain, MD;  Location: [facility] ENDOSCOPY;  Service: Cardiovascular;  Laterality: N/A;   KNEE SURGERY Bilateral    Torn miniscus   RECTAL EXAM UNDER ANESTHESIA N/A 12/22/2018   Procedure: ANORECTAL   EXAM UNDER ANESTHESIA;  Surgeon: Teresa Lonni HERO, MD;  Location: [facility];  Service: General;  Laterality: N/A;   RECTOCELE REPAIR     TONSILLECTOMY     TOTAL KNEE ARTHROPLASTY Left 07/26/2024   TOTAL KNEE ARTHROPLASTY Right 05/28/2023   VAGINAL HYSTERECTOMY     Patient Active Problem List   Diagnosis Date Noted   Anxiety 05/13/2023   Bilateral lower extremity edema 05/13/2023   Class 3 severe obesity with body mass index (BMI) of 40.0 to 44.9 in adult [facility]) 05/13/2023   Pulmonary hypertension (HCC) 05/13/2023   History of left breast cancer 05/13/2023   Tricuspid valve insufficiency 05/13/2023   Hypercoagulable state due to persistent atrial fibrillation (HCC) 04/13/2023   Anal pain 02/16/2023   B12 deficiency 02/16/2023   Dysphagia 02/16/2023   Family history of colon cancer 02/16/2023   Functional diarrhea 02/16/2023   Knee pain, acute 02/16/2023   Vitamin D  deficiency 02/16/2023   Adenomatous polyp of colon 04/30/2022   Esophageal reflux 04/30/2022   Family history of malignant neoplasm of digestive organs 04/30/2022   Arthralgia of left ankle 04/10/2022   Encounter for monitoring dofetilide  therapy 07/04/2021   Pain in both knees 06/23/2021   Carcinoma of upper-inner quadrant of left breast in female, estrogen receptor positive (HCC) 12/20/2020   Trigger thumb of left hand 06/28/2019   Depression, major, single episode, mild 03/14/2019   Generalized anxiety disorder 03/14/2019   PAF (paroxysmal atrial  fibrillation) (HCC)    Osteoarthritis of knee 04/20/2018   Post-menopausal 02/22/2018   Snoring 01/12/2018   Encounter for cardioversion procedure    Persistent atrial fibrillation 11/11/2017   Hyperlipemia 04/16/2017   Weakness of left hand 05/11/2016   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     THERAPY DIAG:  Chronic pain of left knee  Stiffness of left knee, not elsewhere classified  Localized edema  Rationale for Evaluation and Treatment: Rehabilitation  ONSET DATE: 07/26/24 (surgery date).    SUBJECTIVE:   SUBJECTIVE STATEMENT:  Still getting pinch/ pulling pain LT knee.  PERTINENT HISTORY: Right TKA.   PAIN:  Are you having pain? Yes: NPRS scale: 2/10. Pain location: Left knee Pain description: Sore.   Aggravating factors: increased up time.   Relieving factors: Ice and elevation.    PRECAUTIONS: Other: No ultrasound.    RED FLAGS: None   WEIGHT BEARING RESTRICTIONS: No  FALLS:  Has patient fallen in last 6 months? No  LIVING ENVIRONMENT: Lives with: lives alone Lives in: House/apartment Has following equipment at home: [name] - 2 wheeled  OCCUPATION: Retired.    PLOF: Independent  PATIENT GOALS: Do more with less pain.      OBJECTIVE:   PATIENT SURVEYS:  LEFS:  56/80.    EDEMA:  Circumferential: 6 cms greater on left than right.    PALPATION: Mild tenderness.  Incisional site appears to be healing well.  Some steri-strips remaining.    LOWER EXTREMITY ROM:  Full active left knee extension and active flexion to 90 degrees and passive to 100 degrees.    LOWER EXTREMITY MMT:  Right knee strength 4+/5.   GAIT: Patient has a FWW but has been walking safely without an assistive device with a decrease in step and stride length.                                                                                                                                TREATMENT DATE:   09/11/24                                  EXERCISE LOG      6 weeks 09-06-24  LT knee  Exercise Repetitions and Resistance Comments  Nustep Lvl 3 x 15 mins; seat 8   Rockerboard 5 mins   Lunges 14 box x 4 mins   Forward Step Ups 6 in 2x10   LAQs 4# 2 sets of 15 reps   Seated Marches  4# 2 sets of 15 reps   Goal Assessment      Blank cell = exercise not  performed today   Modalities  Date:  Vaso: Knee, 34 degrees; low pressure, 15 mins,  Pain and Edema   08/31/24:  Nustep level 3 x 16 minutes moving seat forward x 2 to increase flexion  Rockerboard x 5 minutes in parallel bars 14in box lunges x 4 mins    LAQ's with 4#  3x 10 pause top   HS curl green band  3x10     LE elevation with vasopneumatic on low x 15 minutes.    9/222/25:  Nustep level 2 x 16 minutes moving seat forward x 1 to increase flexion f/b Rockerboard in parallel bars x 4 minutes f/b SAQ's with 3# x 4 minutes f/b Gentle LLLDS into left knee flexion x 3 minutes f/b Vasopneumatic and elevation x    PATIENT EDUCATION:  Education details: Supine knee flexion stretch. Person educated: Pt. Education method: Demo. Education comprehension: Excellent  HOME EXERCISE PROGRAM: As above.    ASSESSMENT:  CLINICAL IMPRESSION: Pt arrived  for today's treatment doing fairly well, but continues to have some anterior knee pain at times. She was able to continue with strengthening exs as well as ROM progression act.'s and did well. Vaso end of session for pain/ swelling.  OBJECTIVE IMPAIRMENTS: Abnormal gait, decreased activity tolerance, decreased ROM, increased edema, and pain.   ACTIVITY LIMITATIONS: carrying, lifting, bending, and locomotion level  PARTICIPATION LIMITATIONS: meal prep, cleaning, laundry, community activity, and yard work  [facility] POTENTIAL: Excellent  CLINICAL DECISION MAKING: Stable/uncomplicated  EVALUATION COMPLEXITY: Low   GOALS:  SHORT TERM GOALS: Target date: 09/08/24.  Ind with an initial HEP. Goal status: MET   LONG TERM GOALS: Target date: 10/06/24.  Ind with an advanced HEP.  Goal status: IN PROGRESS  2.  Active left knee flexion to 120 degrees+ so the patient can perform functional tasks and do so with pain not > 2-3/10.   10/2: 110 degrees Goal status: IN PROGRESS  3. Perform a reciprocating stair gait with one railing with pain  not > 2-3/10.  Goal status: IN PROGRESS  4.  Improve LEFS score by at least 20 points.   10/2: 62/80 Goal status: IN PROGRESS  5.  Perform ADL's with pain not > 3/10.  10/2: average 2/10 Goal status: MET  PLAN:  PT FREQUENCY: 2x/week  PT DURATION: 6 weeks  PLANNED INTERVENTIONS: 97110-Therapeutic exercises, 97530- Therapeutic activity, 97112- Neuromuscular re-education, 97535- Self Care, [id number]- Manual therapy, [id number]- Electrical stimulation (unattended), 97016- Vasopneumatic device, Patient/Family education, and Cryotherapy  PLAN FOR NEXT SESSION: Nustep, progress to recumbent bike, progress into TKA protocol, vasopneumatic and elevation.     Keatyn Luck,CHRIS, PTA 09/11/2024, 1:41 PM

## 2024-09-12 ENCOUNTER — Encounter: Payer: Self-pay | Admitting: *Deleted

## 2024-09-14 ENCOUNTER — Ambulatory Visit: Admitting: Physical Therapy

## 2024-09-14 ENCOUNTER — Encounter: Payer: Self-pay | Admitting: Physical Therapy

## 2024-09-14 DIAGNOSIS — M25562 Pain in left knee: Secondary | ICD-10-CM | POA: Diagnosis not present

## 2024-09-14 DIAGNOSIS — M25662 Stiffness of left knee, not elsewhere classified: Secondary | ICD-10-CM

## 2024-09-14 DIAGNOSIS — G8929 Other chronic pain: Secondary | ICD-10-CM

## 2024-09-14 NOTE — Therapy (Signed)
 OUTPATIENT PHYSICAL THERAPY LOWER EXTREMITY TREATMENT  Patient Name: [name] Collier MRN: [id number] DOB:1946-06-09, 78 y.o., female Today's Date: 09/14/2024  END OF SESSION:  PT End of Session - 09/14/24 1105     Visit Number 8    Number of Visits 12    Date for Recertification  10/06/24    PT Start Time 1015  1111   Activity Tolerance Patient tolerated treatment well    Behavior During Therapy [facility] for tasks assessed/performed           Past Medical History:  Diagnosis Date   Anxiety    Atrial fibrillation (HCC)    Cancer (HCC) 11/2020   left breast [facility]   Frailty 05/13/2023   GERD (gastroesophageal reflux disease)    HTN (hypertension)    Hyperlipidemia    Personal history of radiation therapy    TIA (transient ischemic attack)    Past Surgical History:  Procedure Laterality Date   BOTOX  INJECTION N/A 12/22/2018   Procedure: BOTOX  INJECTION INTO ANAL SPHINCTER;  Surgeon: Teresa Lonni HERO, MD;  Location: [facility];  Service: General;  Laterality: N/A;   BREAST BIOPSY Left 11/26/2020   BREAST LUMPECTOMY WITH RADIOACTIVE SEED AND SENTINEL LYMPH NODE BIOPSY Left 01/07/2021   Procedure: LEFT BREAST LUMPECTOMY WITH RADIOACTIVE SEED AND LEFT AXILLARY SENTINEL LYMPH NODE BIOPSY WITH BLUE DYE INJECTION;  Surgeon: Belinda Cough, MD;  Location: [facility];  Service: General;  Laterality: Left;  LMA WITH PECTORAL BLOCK, BLUE DYE INJECTION   CARDIOVERSION N/A 01/04/2018   Procedure: CARDIOVERSION;  Surgeon: Moose Leim DEL, MD;  Location: [facility] ENDOSCOPY;  Service: Cardiovascular;  Laterality: N/A;   CARDIOVERSION N/A 09/20/2018   Procedure: CARDIOVERSION;  Surgeon: Shlomo Wilbert SAUNDERS, MD;  Location: [facility];  Service: Cardiovascular;  Laterality: N/A;   CARDIOVERSION N/A 10/27/2018   Procedure: CARDIOVERSION;  Surgeon: Lonni Slain, MD;  Location: [facility] ENDOSCOPY;  Service: Cardiovascular;  Laterality: N/A;   KNEE SURGERY Bilateral    Torn miniscus    RECTAL EXAM UNDER ANESTHESIA N/A 12/22/2018   Procedure: ANORECTAL  EXAM UNDER ANESTHESIA;  Surgeon: Teresa Lonni HERO, MD;  Location: [facility];  Service: General;  Laterality: N/A;   RECTOCELE REPAIR     TONSILLECTOMY     TOTAL KNEE ARTHROPLASTY Left 07/26/2024   TOTAL KNEE ARTHROPLASTY Right 05/28/2023   VAGINAL HYSTERECTOMY     Patient Active Problem List   Diagnosis Date Noted   Anxiety 05/13/2023   Bilateral lower extremity edema 05/13/2023   Class 3 severe obesity with body mass index (BMI) of 40.0 to 44.9 in adult [facility]) 05/13/2023   Pulmonary hypertension (HCC) 05/13/2023   History of left breast cancer 05/13/2023   Tricuspid valve insufficiency 05/13/2023   Hypercoagulable state due to persistent atrial fibrillation (HCC) 04/13/2023   Anal pain 02/16/2023   B12 deficiency 02/16/2023   Dysphagia 02/16/2023   Family history of colon cancer 02/16/2023   Functional diarrhea 02/16/2023   Knee pain, acute 02/16/2023   Vitamin D  deficiency 02/16/2023   Adenomatous polyp of colon 04/30/2022   Esophageal reflux 04/30/2022   Family history of malignant neoplasm of digestive organs 04/30/2022   Arthralgia of left ankle 04/10/2022   Encounter for monitoring dofetilide  therapy 07/04/2021   Pain in both knees 06/23/2021   Carcinoma of upper-inner quadrant of left breast in female, estrogen receptor positive (HCC) 12/20/2020   Trigger thumb of left hand 06/28/2019   Depression, major, single episode, mild 03/14/2019   Generalized anxiety disorder 03/14/2019  PAF (paroxysmal atrial fibrillation) (HCC)    Osteoarthritis of knee 04/20/2018   Post-menopausal 02/22/2018   Snoring 01/12/2018   Encounter for cardioversion procedure    Persistent atrial fibrillation 11/11/2017   Hyperlipemia 04/16/2017   Weakness of left hand 05/11/2016   Anxiety related tremor 05/11/2016   Essential hypertension    Normochromic normocytic anemia    Lymphadenopathy 04/12/2014    REFERRING PROVIDER:  Alm Going MD  REFERRING DIAG: Left total knee replacement.    THERAPY DIAG:  Chronic pain of left knee  Stiffness of left knee, not elsewhere classified  Rationale for Evaluation and Treatment: Rehabilitation  ONSET DATE: 07/26/24 (surgery date).    SUBJECTIVE:   SUBJECTIVE STATEMENT: Stiff but doing good.    PERTINENT HISTORY: Right TKA.   PAIN:  Are you having pain? Yes: NPRS scale: 2/10. Pain location: Left knee Pain description: Sore.   Aggravating factors: increased up time.   Relieving factors: Ice and elevation.    PRECAUTIONS: Other: No ultrasound.    RED FLAGS: None   WEIGHT BEARING RESTRICTIONS: No  FALLS:  Has patient fallen in last 6 months? No  LIVING ENVIRONMENT: Lives with: lives alone Lives in: House/apartment Has following equipment at home: [name] - 2 wheeled  OCCUPATION: Retired.    PLOF: Independent  PATIENT GOALS: Do more with less pain.      OBJECTIVE:   PATIENT SURVEYS:  LEFS:  56/80.    EDEMA:  Circumferential: 6 cms greater on left than right.    PALPATION: Mild tenderness.  Incisional site appears to be healing well.  Some steri-strips remaining.    LOWER EXTREMITY ROM:  Full active left knee extension and active flexion to 90 degrees and passive to 100 degrees.    LOWER EXTREMITY MMT:  Right knee strength 4+/5.   GAIT: Patient has a FWW but has been walking safely without an assistive device with a decrease in step and stride length.                                                                                                                                TREATMENT DATE:   09/14/24:                                       EXERCISE LOG  Exercise Repetitions and Resistance Comments  Nustep Level 3 x 15 minutes beginning at seat 9 and progressing to seat 6.   Rockerboard  In parallel bars with minimal hand usage x 5 minutes.     14 inch box lunges In parallel bars x 4 minutes            STW/M x 5 minutes to  medial knee f/b LE elevation and IFC at 80-150 Hz on 40% scan x 20 minutes.    09/11/24  EXERCISE LOG      6 weeks 09-06-24  LT knee  Exercise Repetitions and Resistance Comments  Nustep Lvl 3 x 15 mins; seat 8   Rockerboard 5 mins   Lunges 14 box x 4 mins   Forward Step Ups 6 in 2x10   LAQs 4# 2 sets of 15 reps   Seated Marches  4# 2 sets of 15 reps   Goal Assessment      Blank cell = exercise not performed today   Modalities  Date:  Vaso: Knee, 34 degrees; low pressure, 15 mins, Pain and Edema   08/31/24:  Nustep level 3 x 16 minutes moving seat forward x 2 to increase flexion  Rockerboard x 5 minutes in parallel bars 14in box lunges x 4 mins    LAQ's with 4#  3x 10 pause top   HS curl green band  3x10     LE elevation with vasopneumatic on low x 15 minutes.    9/222/25:  Nustep level 2 x 16 minutes moving seat forward x 1 to increase flexion f/b Rockerboard in parallel bars x 4 minutes f/b SAQ's with 3# x 4 minutes f/b Gentle LLLDS into left knee flexion x 3 minutes f/b Vasopneumatic and elevation x    PATIENT EDUCATION:  Education details: Supine knee flexion stretch. Person educated: Pt. Education method: Demo. Education comprehension: Excellent  HOME EXERCISE PROGRAM: As above.    ASSESSMENT:  CLINICAL IMPRESSION: Patient is progressing well but is tender to palpation over her left Pes Anserine bursa.  Added electrical stimulation to session today and she felt much better following.    OBJECTIVE IMPAIRMENTS: Abnormal gait, decreased activity tolerance, decreased ROM, increased edema, and pain.   ACTIVITY LIMITATIONS: carrying, lifting, bending, and locomotion level  PARTICIPATION LIMITATIONS: meal prep, cleaning, laundry, community activity, and yard work  [facility] POTENTIAL: Excellent  CLINICAL DECISION MAKING: Stable/uncomplicated  EVALUATION COMPLEXITY: Low   GOALS:  SHORT TERM GOALS: Target date: 09/08/24.  Ind with an  initial HEP. Goal status: MET   LONG TERM GOALS: Target date: 10/06/24.  Ind with an advanced HEP.  Goal status: IN PROGRESS  2.  Active left knee flexion to 120 degrees+ so the patient can perform functional tasks and do so with pain not > 2-3/10.   10/2: 110 degrees Goal status: IN PROGRESS  3. Perform a reciprocating stair gait with one railing with pain not > 2-3/10.  Goal status: IN PROGRESS  4.  Improve LEFS score by at least 20 points.   10/2: 62/80 Goal status: IN PROGRESS  5.  Perform ADL's with pain not > 3/10.  10/2: average 2/10 Goal status: MET  PLAN:  PT FREQUENCY: 2x/week  PT DURATION: 6 weeks  PLANNED INTERVENTIONS: 97110-Therapeutic exercises, 97530- Therapeutic activity, 97112- Neuromuscular re-education, 97535- Self Care, 02859- Manual therapy, [id number]- Electrical stimulation (unattended), 97016- Vasopneumatic device, Patient/Family education, and Cryotherapy  PLAN FOR NEXT SESSION: Nustep, progress to recumbent bike, progress into TKA protocol, vasopneumatic and elevation.     Solan Vosler, [place], PT 09/14/2024, 11:54 AM

## 2024-09-18 ENCOUNTER — Ambulatory Visit

## 2024-09-18 DIAGNOSIS — M25562 Pain in left knee: Secondary | ICD-10-CM | POA: Diagnosis not present

## 2024-09-18 DIAGNOSIS — M25662 Stiffness of left knee, not elsewhere classified: Secondary | ICD-10-CM

## 2024-09-18 DIAGNOSIS — G8929 Other chronic pain: Secondary | ICD-10-CM

## 2024-09-18 DIAGNOSIS — R600 Localized edema: Secondary | ICD-10-CM

## 2024-09-18 NOTE — Therapy (Signed)
 OUTPATIENT PHYSICAL THERAPY LOWER EXTREMITY TREATMENT  Patient Name: [name] Collier MRN: [id number] DOB:December 28, 1945, 78 y.o., female Today's Date: 09/18/2024  END OF SESSION:  PT End of Session - 09/14/24 1105     Visit Number 8    Number of Visits 12    Date for Recertification  10/06/24    PT Start Time 1015  1111   Activity Tolerance Patient tolerated treatment well    Behavior During Therapy [facility] for tasks assessed/performed           Past Medical History:  Diagnosis Date   Anxiety    Atrial fibrillation (HCC)    Cancer (HCC) 11/2020   left breast [facility]   Frailty 05/13/2023   GERD (gastroesophageal reflux disease)    HTN (hypertension)    Hyperlipidemia    Personal history of radiation therapy    TIA (transient ischemic attack)    Past Surgical History:  Procedure Laterality Date   BOTOX  INJECTION N/A 12/22/2018   Procedure: BOTOX  INJECTION INTO ANAL SPHINCTER;  Surgeon: Teresa Lonni HERO, MD;  Location: [facility];  Service: General;  Laterality: N/A;   BREAST BIOPSY Left 11/26/2020   BREAST LUMPECTOMY WITH RADIOACTIVE SEED AND SENTINEL LYMPH NODE BIOPSY Left 01/07/2021   Procedure: LEFT BREAST LUMPECTOMY WITH RADIOACTIVE SEED AND LEFT AXILLARY SENTINEL LYMPH NODE BIOPSY WITH BLUE DYE INJECTION;  Surgeon: Belinda Cough, MD;  Location: [facility];  Service: General;  Laterality: Left;  LMA WITH PECTORAL BLOCK, BLUE DYE INJECTION   CARDIOVERSION N/A 01/04/2018   Procedure: CARDIOVERSION;  Surgeon: Moose Leim DEL, MD;  Location: [facility] END[facility]COPY;  Service: Cardiovascular;  Laterality: N/A;   CARDIOVERSION N/A 09/20/2018   Procedure: CARDIOVERSION;  Surgeon: Shlomo Wilbert SAUNDERS, MD;  Location: [facility]OSCOPY;  Service: Cardiovascular;  Laterality: N/A;   CARDIOVERSION N/A 10/27/2018   Procedure: CARDIOVERSION;  Surgeon: Lonni Slain, MD;  Location: [facility] ENDOSCOPY;  Service: Cardiovascular;  Laterality: N/A;   KNEE SURGERY Bilateral    Torn miniscus    RECTAL EXAM UNDER ANESTHESIA N/A 12/22/2018   Procedure: ANORECTAL  EXAM UNDER ANESTHESIA;  Surgeon: Teresa Lonni HERO, MD;  Location: [facility];  Service: General;  Laterality: N/A;   RECTOCELE REPAIR     TONSILLECTOMY     TOTAL KNEE ARTHROPLASTY Left 07/26/2024   TOTAL KNEE ARTHROPLASTY Right 05/28/2023   VAGINAL HYSTERECTOMY     Patient Active Problem List   Diagnosis Date Noted   Anxiety 05/13/2023   Bilateral lower extremity edema 05/13/2023   Class 3 severe obesity with body mass index (BMI) of 40.0 to 44.9 in adult [facility]) 05/13/2023   Pulmonary hypertension (HCC) 05/13/2023   History of left breast cancer 05/13/2023   Tricuspid valve insufficiency 05/13/2023   Hypercoagulable state due to persistent atrial fibrillation (HCC) 04/13/2023   Anal pain 02/16/2023   B12 deficiency 02/16/2023   Dysphagia 02/16/2023   Family history of colon cancer 02/16/2023   Functional diarrhea 02/16/2023   Knee pain, acute 02/16/2023   Vitamin D  deficiency 02/16/2023   Adenomatous polyp of colon 04/30/2022   Esophageal reflux 04/30/2022   Family history of malignant neoplasm of digestive organs 04/30/2022   Arthralgia of left ankle 04/10/2022   Encounter for monitoring dofetilide  therapy 07/04/2021   Pain in both knees 06/23/2021   Carcinoma of upper-inner quadrant of left breast in female, estrogen receptor positive (HCC) 12/20/2020   Trigger thumb of left hand 06/28/2019   Depression, major, single episode, mild 03/14/2019   Generalized anxiety disorder 03/14/2019  PAF (paroxysmal atrial fibrillation) (HCC)    Osteoarthritis of knee 04/20/2018   Post-menopausal 02/22/2018   Snoring 01/12/2018   Encounter for cardioversion procedure    Persistent atrial fibrillation 11/11/2017   Hyperlipemia 04/16/2017   Weakness of left hand 05/11/2016   Anxiety related tremor 05/11/2016   Essential hypertension    Normochromic normocytic anemia    Lymphadenopathy 04/12/2014    REFERRING PROVIDER:  Alm Going MD  REFERRING DIAG: Left total knee replacement.    THERAPY DIAG:  Chronic pain of left knee  Stiffness of left knee, not elsewhere classified  Localized edema  Rationale for Evaluation and Treatment: Rehabilitation  ONSET DATE: 07/26/24 (surgery date).    SUBJECTIVE:   SUBJECTIVE STATEMENT: Pt denies any pain, but does report stiffness.  PERTINENT HISTORY: Right TKA.   PAIN:  Are you having pain? Yes: NPRS scale: 2/10. Pain location: Left knee Pain description: Sore.   Aggravating factors: increased up time.   Relieving factors: Ice and elevation.    PRECAUTIONS: Other: No ultrasound.    RED FLAGS: None   WEIGHT BEARING RESTRICTIONS: No  FALLS:  Has patient fallen in last 6 months? No  LIVING ENVIRONMENT: Lives with: lives alone Lives in: House/apartment Has following equipment at home: [name] - 2 wheeled  OCCUPATION: Retired.    PLOF: Independent  PATIENT GOALS: Do more with less pain.    OBJECTIVE:   PATIENT SURVEYS:  LEFS:  56/80.    EDEMA:  Circumferential: 6 cms greater on left than right.    PALPATION: Mild tenderness.  Incisional site appears to be healing well.  Some steri-strips remaining.    LOWER EXTREMITY ROM:  Full active left knee extension and active flexion to 90 degrees and passive to 100 degrees.    LOWER EXTREMITY MMT:  Right knee strength 4+/5.   GAIT: Patient has a FWW but has been walking safely without an assistive device with a decrease in step and stride length.                                                                                                                                TREATMENT DATE:   09/18/24:                                     EXERCISE LOG  Exercise Repetitions and Resistance Comments  Nustep Level 3 x 15 mins; seat 8 - 6   Recumbent Bike Seat 7 x 3 mins   Rockerboard  In parallel bars with minimal hand usage x 5 minutes.     14 inch box lunges In parallel bars x 4 minutes          Modalities  Date:  Unattended Estim: Knee, IFC 80-150 hz, 15 mins, Pain and Edema Vaso: Knee, 34 degrees; medium pressure, 15 mins, Pain and Edema   09/11/24  EXERCISE LOG      6 weeks 09-06-24  LT knee  Exercise Repetitions and Resistance Comments  Nustep Lvl 3 x 15 mins; seat 8   Rockerboard 5 mins   Lunges 14 box x 4 mins   Forward Step Ups 6 in 2x10   LAQs 4# 2 sets of 15 reps   Seated Marches  4# 2 sets of 15 reps   Goal Assessment      Blank cell = exercise not performed today   Modalities  Date:  Vaso: Knee, 34 degrees; low pressure, 15 mins, Pain and Edema   08/31/24:  Nustep level 3 x 16 minutes moving seat forward x 2 to increase flexion  Rockerboard x 5 minutes in parallel bars 14in box lunges x 4 mins    LAQ's with 4#  3x 10 pause top   HS curl green band  3x10     LE elevation with vasopneumatic on low x 15 minutes.    9/222/25:  Nustep level 2 x 16 minutes moving seat forward x 1 to increase flexion f/b Rockerboard in parallel bars x 4 minutes f/b SAQ's with 3# x 4 minutes f/b Gentle LLLDS into left knee flexion x 3 minutes f/b Vasopneumatic and elevation x    PATIENT EDUCATION:  Education details: Supine knee flexion stretch. Person educated: Pt. Education method: Demo. Education comprehension: Excellent  HOME EXERCISE PROGRAM: As above.    ASSESSMENT:  CLINICAL IMPRESSION: Pt arrives for today's treatment session reporting very minimal pain, but does report stiffness and increased edema as she went to [place] earlier in the day.  Pt limited on recumbent bike today due to increased edema.  Normal responses to estim and vaso noted upon removal.  Pt denied any pain and reported decreased stiffness at completion of today's treatment session.   OBJECTIVE IMPAIRMENTS: Abnormal gait, decreased activity tolerance, decreased ROM, increased edema, and pain.   ACTIVITY LIMITATIONS: carrying, lifting, bending, and locomotion  level  PARTICIPATION LIMITATIONS: meal prep, cleaning, laundry, community activity, and yard work  [facility] POTENTIAL: Excellent  CLINICAL DECISION MAKING: Stable/uncomplicated  EVALUATION COMPLEXITY: Low   GOALS:  SHORT TERM GOALS: Target date: 09/08/24.  Ind with an initial HEP. Goal status: MET   LONG TERM GOALS: Target date: 10/06/24.  Ind with an advanced HEP.  Goal status: IN PROGRESS  2.  Active left knee flexion to 120 degrees+ so the patient can perform functional tasks and do so with pain not > 2-3/10.   10/2: 110 degrees Goal status: IN PROGRESS  3. Perform a reciprocating stair gait with one railing with pain not > 2-3/10.  Goal status: IN PROGRESS  4.  Improve LEFS score by at least 20 points.   10/2: 62/80 Goal status: IN PROGRESS  5.  Perform ADL's with pain not > 3/10.  10/2: average 2/10 Goal status: MET  PLAN:  PT FREQUENCY: 2x/week  PT DURATION: 6 weeks  PLANNED INTERVENTIONS: 97110-Therapeutic exercises, 97530- Therapeutic activity, 97112- Neuromuscular re-education, 97535- Self Care, [id number]- Manual therapy, [id number]- Electrical stimulation (unattended), 97016- Vasopneumatic device, Patient/Family education, and Cryotherapy  PLAN FOR NEXT SESSION: Nustep, progress to recumbent bike, progress into TKA protocol, vasopneumatic and elevation.     Delon DELENA Gosling, PTA 09/18/2024, 5:41 PM

## 2024-09-21 ENCOUNTER — Ambulatory Visit

## 2024-09-21 DIAGNOSIS — M25562 Pain in left knee: Secondary | ICD-10-CM | POA: Diagnosis not present

## 2024-09-21 DIAGNOSIS — G8929 Other chronic pain: Secondary | ICD-10-CM

## 2024-09-21 DIAGNOSIS — M25662 Stiffness of left knee, not elsewhere classified: Secondary | ICD-10-CM

## 2024-09-21 DIAGNOSIS — R600 Localized edema: Secondary | ICD-10-CM

## 2024-09-21 NOTE — Therapy (Signed)
 OUTPATIENT PHYSICAL THERAPY LOWER EXTREMITY TREATMENT  Patient Name: [name] Collier MRN: [id number] DOB:[birth year], 78 y.o., female Today's Date: 09/21/2024  END OF SESSION:  PT End of Session - 09/14/24 1105     Visit Number 9   Number of Visits 12    Date for Recertification  10/06/24    PT Start Time 1015  1111   Activity Tolerance Patient tolerated treatment well    Behavior During Therapy [facility] for tasks assessed/performed           Past Medical History:  Diagnosis Date   Anxiety    Atrial fibrillation (HCC)    Cancer (HCC) 11/2020   left breast [facility]   Frailty 05/13/2023   GERD (gastroesophageal reflux disease)    HTN (hypertension)    Hyperlipidemia    Personal history of radiation therapy    TIA (transient ischemic attack)    Past Surgical History:  Procedure Laterality Date   BOTOX  INJECTION N/A 12/22/2018   Procedure: BOTOX  INJECTION INTO ANAL SPHINCTER;  Surgeon: Teresa Lonni HERO, MD;  Location: [facility];  Service: General;  Laterality: N/A;   BREAST BIOPSY Left 11/26/2020   BREAST LUMPECTOMY WITH RADIOACTIVE SEED AND SENTINEL LYMPH NODE BIOPSY Left 01/07/2021   Procedure: LEFT BREAST LUMPECTOMY WITH RADIOACTIVE SEED AND LEFT AXILLARY SENTINEL LYMPH NODE BIOPSY WITH BLUE DYE INJECTION;  Surgeon: Belinda Cough, MD;  Location: [facility];  Service: General;  Laterality: Left;  LMA WITH PECTORAL BLOCK, BLUE DYE INJECTION   CARDIOVERSION N/A 01/04/2018   Procedure: CARDIOVERSION;  Surgeon: Moose Leim DEL, MD;  Location: [facility] END[facility];  Service: Cardiovascular;  Laterality: N/A;   CARDIOVERSION N/A 09/20/2018   Procedure: CARDIOVERSION;  Surgeon: Shlomo Wilbert SAUNDERS, MD;  Location: [facility]OSCOPY;  Service: Cardiovascular;  Laterality: N/A;   CARDIOVERSION N/A 10/27/2018   Procedure: CARDIOVERSION;  Surgeon: Lonni Slain, MD;  Location: [facility] ENDOSCOPY;  Service: Cardiovascular;  Laterality: N/A;   KNEE SURGERY Bilateral    Torn miniscus    RECTAL EXAM UNDER ANESTHESIA N/A 12/22/2018   Procedure: ANORECTAL  EXAM UNDER ANESTHESIA;  Surgeon: Teresa Lonni HERO, MD;  Location: [facility];  Service: General;  Laterality: N/A;   RECTOCELE REPAIR     TONSILLECTOMY     TOTAL KNEE ARTHROPLASTY Left 07/26/2024   TOTAL KNEE ARTHROPLASTY Right 05/28/2023   VAGINAL HYSTERECTOMY     Patient Active Problem List   Diagnosis Date Noted   Anxiety 05/13/2023   Bilateral lower extremity edema 05/13/2023   Class 3 severe obesity with body mass index (BMI) of 40.0 to 44.9 in adult [facility]) 05/13/2023   Pulmonary hypertension (HCC) 05/13/2023   History of left breast cancer 05/13/2023   Tricuspid valve insufficiency 05/13/2023   Hypercoagulable state due to persistent atrial fibrillation (HCC) 04/13/2023   Anal pain 02/16/2023   B12 deficiency 02/16/2023   Dysphagia 02/16/2023   Family history of colon cancer 02/16/2023   Functional diarrhea 02/16/2023   Knee pain, acute 02/16/2023   Vitamin D  deficiency 02/16/2023   Adenomatous polyp of colon 04/30/2022   Esophageal reflux 04/30/2022   Family history of malignant neoplasm of digestive organs 04/30/2022   Arthralgia of left ankle 04/10/2022   Encounter for monitoring dofetilide  therapy 07/04/2021   Pain in both knees 06/23/2021   Carcinoma of upper-inner quadrant of left breast in female, estrogen receptor positive (HCC) 12/20/2020   Trigger thumb of left hand 06/28/2019   Depression, major, single episode, mild 03/14/2019   Generalized anxiety disorder 03/14/2019  PAF (paroxysmal atrial fibrillation) (HCC)    Osteoarthritis of knee 04/20/2018   Post-menopausal 02/22/2018   Snoring 01/12/2018   Encounter for cardioversion procedure    Persistent atrial fibrillation 11/11/2017   Hyperlipemia 04/16/2017   Weakness of left hand 05/11/2016   Anxiety related tremor 05/11/2016   Essential hypertension    Normochromic normocytic anemia    Lymphadenopathy 04/12/2014    REFERRING PROVIDER:  Alm Going MD  REFERRING DIAG: Left total knee replacement.    THERAPY DIAG:  Chronic pain of left knee  Stiffness of left knee, not elsewhere classified  Localized edema  Rationale for Evaluation and Treatment: Rehabilitation  ONSET DATE: 07/26/24 (surgery date).    SUBJECTIVE:   SUBJECTIVE STATEMENT: Pt denies any pain, but does report stiffness.  PERTINENT HISTORY: Right TKA.   PAIN:  Are you having pain? No  PRECAUTIONS: Other: No ultrasound.    RED FLAGS: None   WEIGHT BEARING RESTRICTIONS: No  FALLS:  Has patient fallen in last 6 months? No  LIVING ENVIRONMENT: Lives with: lives alone Lives in: House/apartment Has following equipment at home: [name] - 2 wheeled  OCCUPATION: Retired.    PLOF: Independent  PATIENT GOALS: Do more with less pain.    OBJECTIVE:   PATIENT SURVEYS:  LEFS:  56/80.    EDEMA:  Circumferential: 6 cms greater on left than right.    PALPATION: Mild tenderness.  Incisional site appears to be healing well.  Some steri-strips remaining.    LOWER EXTREMITY ROM:  Full active left knee extension and active flexion to 90 degrees and passive to 100 degrees.    LOWER EXTREMITY MMT:  Right knee strength 4+/5.   GAIT: Patient has a FWW but has been walking safely without an assistive device with a decrease in step and stride length.                                                                                                                                TREATMENT DATE:   09/21/24:                                     EXERCISE LOG  Exercise Repetitions and Resistance Comments  Nustep Level 3 x 15 mins; seat 6   Recumbent Bike    Rockerboard  In parallel bars with minimal hand usage x 5 minutes.     14 inch box lunges In parallel bars x 4 minutes    Forward Step Ups 6 box x 25 reps   LAQs 4# 2 sets x 15 reps    Seated Marches 4# 2 sets of 15 reps    Modalities  Date:  Unattended Estim: Knee, IFC 80-150 hz, 15 mins,  Pain and Edema Vaso: Knee, 34 degrees; medium pressure, 15 mins, Pain and Edema   09/11/24  EXERCISE LOG      6 weeks 09-06-24  LT knee  Exercise Repetitions and Resistance Comments  Nustep Lvl 3 x 15 mins; seat 8   Rockerboard 5 mins   Lunges 14 box x 4 mins   Forward Step Ups 6 in 2x10   LAQs 4# 2 sets of 15 reps   Seated Marches  4# 2 sets of 15 reps   Goal Assessment      Blank cell = exercise not performed today   Modalities  Date:  Vaso: Knee, 34 degrees; low pressure, 15 mins, Pain and Edema   08/31/24:  Nustep level 3 x 16 minutes moving seat forward x 2 to increase flexion  Rockerboard x 5 minutes in parallel bars 14in box lunges x 4 mins    LAQ's with 4#  3x 10 pause top   HS curl green band  3x10     LE elevation with vasopneumatic on low x 15 minutes.     PATIENT EDUCATION:  Education details: Supine knee flexion stretch. Person educated: Pt. Education method: Demo. Education comprehension: Excellent  HOME EXERCISE PROGRAM: As above.    ASSESSMENT:  CLINICAL IMPRESSION: Pt arrives for today's treatment session reporting very minimal pain, but does report stiffness and increased edema due to sitting with a sick friend yesterday.   Pt requiring cues to avoid pulling with upper extremities while performing forward steps ups.  Pt instructed in use of home TENs unit with pt able to demonstrate understanding.  Normal responses to estim and vaso noted upon removal.  Pt denied any pain at completion of today's treatment session.  OBJECTIVE IMPAIRMENTS: Abnormal gait, decreased activity tolerance, decreased ROM, increased edema, and pain.   ACTIVITY LIMITATIONS: carrying, lifting, bending, and locomotion level  PARTICIPATION LIMITATIONS: meal prep, cleaning, laundry, community activity, and yard work  [facility] POTENTIAL: Excellent  CLINICAL DECISION MAKING: Stable/uncomplicated  EVALUATION COMPLEXITY: Low   GOALS:  SHORT TERM  GOALS: Target date: 09/08/24.  Ind with an initial HEP. Goal status: MET   LONG TERM GOALS: Target date: 10/06/24.  Ind with an advanced HEP.  Goal status: IN PROGRESS  2.  Active left knee flexion to 120 degrees+ so the patient can perform functional tasks and do so with pain not > 2-3/10.   10/2: 110 degrees Goal status: IN PROGRESS  3. Perform a reciprocating stair gait with one railing with pain not > 2-3/10.  Goal status: IN PROGRESS  4.  Improve LEFS score by at least 20 points.   10/2: 62/80 Goal status: IN PROGRESS  5.  Perform ADL's with pain not > 3/10.  10/2: average 2/10 Goal status: MET  PLAN:  PT FREQUENCY: 2x/week  PT DURATION: 6 weeks  PLANNED INTERVENTIONS: 97110-Therapeutic exercises, 97530- Therapeutic activity, 97112- Neuromuscular re-education, 97535- Self Care, 02859- Manual therapy, [id number]- Electrical stimulation (unattended), 97016- Vasopneumatic device, Patient/Family education, and Cryotherapy  PLAN FOR NEXT SESSION: Nustep, progress to recumbent bike, progress into TKA protocol, vasopneumatic and elevation.     Delon DELENA Gosling, PTA 09/21/2024, 10:59 AM

## 2024-09-25 ENCOUNTER — Ambulatory Visit

## 2024-09-25 DIAGNOSIS — G8929 Other chronic pain: Secondary | ICD-10-CM

## 2024-09-25 DIAGNOSIS — M25562 Pain in left knee: Secondary | ICD-10-CM | POA: Diagnosis not present

## 2024-09-25 DIAGNOSIS — M25662 Stiffness of left knee, not elsewhere classified: Secondary | ICD-10-CM

## 2024-09-25 DIAGNOSIS — R600 Localized edema: Secondary | ICD-10-CM

## 2024-09-25 NOTE — Therapy (Addendum)
 OUTPATIENT PHYSICAL THERAPY LOWER EXTREMITY TREATMENT  Patient Name: [name] Collier MRN: [id number] DOB:12-27-1945, 78 y.o., female Today's Date: 09/25/2024  END OF SESSION:  PT End of Session - 09/14/24 1105     Visit Number 10   Number of Visits 12    Date for Recertification  10/06/24    PT Start Time 845 950   Activity Tolerance Patient tolerated treatment well    Behavior During Therapy [facility] for tasks assessed/performed           Past Medical History:  Diagnosis Date   Anxiety    Atrial fibrillation (HCC)    Cancer (HCC) 11/2020   left breast [facility]   Frailty 05/13/2023   GERD (gastroesophageal reflux disease)    HTN (hypertension)    Hyperlipidemia    Personal history of radiation therapy    TIA (transient ischemic attack)    Past Surgical History:  Procedure Laterality Date   BOTOX  INJECTION N/A 12/22/2018   Procedure: BOTOX  INJECTION INTO ANAL SPHINCTER;  Surgeon: Teresa Lonni HERO, MD;  Location: [facility];  Service: General;  Laterality: N/A;   BREAST BIOPSY Left 11/26/2020   BREAST LUMPECTOMY WITH RADIOACTIVE SEED AND SENTINEL LYMPH NODE BIOPSY Left 01/07/2021   Procedure: LEFT BREAST LUMPECTOMY WITH RADIOACTIVE SEED AND LEFT AXILLARY SENTINEL LYMPH NODE BIOPSY WITH BLUE DYE INJECTION;  Surgeon: Belinda Cough, MD;  Location: [facility];  Service: General;  Laterality: Left;  LMA WITH PECTORAL BLOCK, BLUE DYE INJECTION   CARDIOVERSION N/A 01/04/2018   Procedure: CARDIOVERSION;  Surgeon: Moose Leim DEL, MD;  Location: [facility];  Service: Cardiovascular;  Laterality: N/A;   CARDIOVERSION N/A 09/20/2018   Procedure: CARDIOVERSION;  Surgeon: Shlomo Wilbert SAUNDERS, MD;  Location: [facility];  Service: Cardiovascular;  Laterality: N/A;   CARDIOVERSION N/A 10/27/2018   Procedure: CARDIOVERSION;  Surgeon: Lonni Slain, MD;  Location: [facility] ENDOSCOPY;  Service: Cardiovascular;  Laterality: N/A;   KNEE SURGERY Bilateral    Torn miniscus    RECTAL EXAM UNDER ANESTHESIA N/A 12/22/2018   Procedure: ANORECTAL  EXAM UNDER ANESTHESIA;  Surgeon: Teresa Lonni HERO, MD;  Location: [facility];  Service: General;  Laterality: N/A;   RECTOCELE REPAIR     TONSILLECTOMY     TOTAL KNEE ARTHROPLASTY Left 07/26/2024   TOTAL KNEE ARTHROPLASTY Right 05/28/2023   VAGINAL HYSTERECTOMY     Patient Active Problem List   Diagnosis Date Noted   Anxiety 05/13/2023   Bilateral lower extremity edema 05/13/2023   Class 3 severe obesity with body mass index (BMI) of 40.0 to 44.9 in adult [facility]) 05/13/2023   Pulmonary hypertension (HCC) 05/13/2023   History of left breast cancer 05/13/2023   Tricuspid valve insufficiency 05/13/2023   Hypercoagulable state due to persistent atrial fibrillation (HCC) 04/13/2023   Anal pain 02/16/2023   B12 deficiency 02/16/2023   Dysphagia 02/16/2023   Family history of colon cancer 02/16/2023   Functional diarrhea 02/16/2023   Knee pain, acute 02/16/2023   Vitamin D  deficiency 02/16/2023   Adenomatous polyp of colon 04/30/2022   Esophageal reflux 04/30/2022   Family history of malignant neoplasm of digestive organs 04/30/2022   Arthralgia of left ankle 04/10/2022   Encounter for monitoring dofetilide  therapy 07/04/2021   Pain in both knees 06/23/2021   Carcinoma of upper-inner quadrant of left breast in female, estrogen receptor positive (HCC) 12/20/2020   Trigger thumb of left hand 06/28/2019   Depression, major, single episode, mild 03/14/2019   Generalized anxiety disorder 03/14/2019   PAF (  paroxysmal atrial fibrillation) (HCC)    Osteoarthritis of knee 04/20/2018   Post-menopausal 02/22/2018   Snoring 01/12/2018   Encounter for cardioversion procedure    Persistent atrial fibrillation 11/11/2017   Hyperlipemia 04/16/2017   Weakness of left hand 05/11/2016   Anxiety related tremor 05/11/2016   Essential hypertension    Normochromic normocytic anemia    Lymphadenopathy 04/12/2014    REFERRING PROVIDER:  Alm Going MD  REFERRING DIAG: Left total knee replacement.    THERAPY DIAG:  Chronic pain of left knee  Stiffness of left knee, not elsewhere classified  Localized edema  Rationale for Evaluation and Treatment: Rehabilitation  ONSET DATE: 07/26/24 (surgery date).    SUBJECTIVE:   SUBJECTIVE STATEMENT: Pt denies any pain, but does report stiffness and soreness.  PERTINENT HISTORY: Right TKA.   PAIN:  Are you having pain? No  PRECAUTIONS: Other: No ultrasound.    RED FLAGS: None   WEIGHT BEARING RESTRICTIONS: No  FALLS:  Has patient fallen in last 6 months? No  LIVING ENVIRONMENT: Lives with: lives alone Lives in: House/apartment Has following equipment at home: [name] - 2 wheeled  OCCUPATION: Retired.    PLOF: Independent  PATIENT GOALS: Do more with less pain.    OBJECTIVE:   PATIENT SURVEYS:  LEFS:  56/80.    EDEMA:  Circumferential: 6 cms greater on left than right.    PALPATION: Mild tenderness.  Incisional site appears to be healing well.  Some steri-strips remaining.    LOWER EXTREMITY ROM:  Full active left knee extension and active flexion to 90 degrees and passive to 100 degrees.    LOWER EXTREMITY MMT:  Right knee strength 4+/5.   GAIT: Patient has a FWW but has been walking safely without an assistive device with a decrease in step and stride length.                                                                                                                                TREATMENT DATE:   09/25/24:                                     EXERCISE LOG  Exercise Repetitions and Resistance Comments  Nustep Level 3 x 15 mins; seat 6   Recumbent Bike    Rockerboard  In parallel bars with minimal hand usage x 5 minutes.     14 inch box lunges In parallel bars x 5 minutes    Forward Step Ups 6 box x 25 reps   LAQs 4# 2 sets x 15 reps    Seated Marches 4# 2 sets of 15 reps   Goal Assessment See Below    Modalities  Date:   Unattended Estim: Knee, IFC 80-150 hz, 15 mins, Pain and Edema Vaso: Knee, 34 degrees; medium pressure, 15 mins, Pain and Edema   09/11/24  EXERCISE LOG      6 weeks 09-06-24  LT knee  Exercise Repetitions and Resistance Comments  Nustep Lvl 3 x 15 mins; seat 8   Rockerboard 5 mins   Lunges 14 box x 4 mins   Forward Step Ups 6 in 2x10   LAQs 4# 2 sets of 15 reps   Seated Marches  4# 2 sets of 15 reps   Goal Assessment      Blank cell = exercise not performed today   Modalities  Date:  Vaso: Knee, 34 degrees; low pressure, 15 mins, Pain and Edema   08/31/24:  Nustep level 3 x 16 minutes moving seat forward x 2 to increase flexion  Rockerboard x 5 minutes in parallel bars 14in box lunges x 4 mins    LAQ's with 4#  3x 10 pause top   HS curl green band  3x10     LE elevation with vasopneumatic on low x 15 minutes.     PATIENT EDUCATION:  Education details: Supine knee flexion stretch. Person educated: Pt. Education method: Demo. Education comprehension: Excellent  HOME EXERCISE PROGRAM: As above.    ASSESSMENT:  CLINICAL IMPRESSION: Pt arrives for today's treatment session reporting very minimal soreness and stiffness, but denies any real pain. Pt able to demonstrate 115 degrees of left knee flexion today, making good progress towards her ROM goal.  Pt with mild difficulty descending stairs due to not trusting her left knee, but without pain.  Normal responses to estim and vaso noted upon removal.  Pt is making good progress towards all of her goals at this time and has MD appt on 11/6.  Pt denied any pain at completion of today's treatment session.  OBJECTIVE IMPAIRMENTS: Abnormal gait, decreased activity tolerance, decreased ROM, increased edema, and pain.   ACTIVITY LIMITATIONS: carrying, lifting, bending, and locomotion level  PARTICIPATION LIMITATIONS: meal prep, cleaning, laundry, community activity, and yard work  [facility] POTENTIAL:  Excellent  CLINICAL DECISION MAKING: Stable/uncomplicated  EVALUATION COMPLEXITY: Low   GOALS:  SHORT TERM GOALS: Target date: 09/08/24.  Ind with an initial HEP. Goal status: MET   LONG TERM GOALS: Target date: 10/06/24.  Ind with an advanced HEP.  Goal status: IN PROGRESS  2.  Active left knee flexion to 120 degrees+ so the patient can perform functional tasks and do so with pain not > 2-3/10.   10/2: 110 degrees Goal status: IN PROGRESS  3. Perform a reciprocating stair gait with one railing with pain not > 2-3/10.  Goal status: IN PROGRESS  4.  Improve LEFS score by at least 20 points.   10/2: 62/80; 10/20: 62/80 Goal status: IN PROGRESS  5.  Perform ADL's with pain not > 3/10.  10/2: average 2/10 Goal status: MET  PLAN:  PT FREQUENCY: 2x/week  PT DURATION: 6 weeks  PLANNED INTERVENTIONS: 97110-Therapeutic exercises, 97530- Therapeutic activity, 97112- Neuromuscular re-education, 97535- Self Care, 02859- Manual therapy, G0283- Electrical stimulation (unattended), 97016- Vasopneumatic device, Patient/Family education, and Cryotherapy  PLAN FOR NEXT SESSION: Nustep, progress to recumbent bike, progress into TKA protocol, vasopneumatic and elevation.     Delon DELENA Gosling, PTA 09/25/2024, 10:44 AM   Progress Note Reporting Period 08/25/24 to 09/25/24.  See note below for Objective Data and Assessment of Progress/Goals. Patient is progressing well and has met LTG #5.    Chad Applegate MPT

## 2024-09-28 ENCOUNTER — Ambulatory Visit

## 2024-09-28 DIAGNOSIS — G8929 Other chronic pain: Secondary | ICD-10-CM

## 2024-09-28 DIAGNOSIS — M25662 Stiffness of left knee, not elsewhere classified: Secondary | ICD-10-CM

## 2024-09-28 DIAGNOSIS — R600 Localized edema: Secondary | ICD-10-CM

## 2024-09-28 DIAGNOSIS — M25562 Pain in left knee: Secondary | ICD-10-CM | POA: Diagnosis not present

## 2024-09-28 NOTE — Therapy (Signed)
 OUTPATIENT PHYSICAL THERAPY LOWER EXTREMITY TREATMENT  Patient Name: [name] Collier MRN: [id number] DOB:1946/06/12, 78 y.o., female Today's Date: 09/28/2024  END OF SESSION:  PT End of Session - 09/14/24 1105     Visit Number 11   Number of Visits 12    Date for Recertification  10/06/24    PT Start Time 845 944   Activity Tolerance Patient tolerated treatment well    Behavior During Therapy [facility] for tasks assessed/performed           Past Medical History:  Diagnosis Date   Anxiety    Atrial fibrillation (HCC)    Cancer (HCC) 11/2020   left breast [facility]   Frailty 05/13/2023   GERD (gastroesophageal reflux disease)    HTN (hypertension)    Hyperlipidemia    Personal history of radiation therapy    TIA (transient ischemic attack)    Past Surgical History:  Procedure Laterality Date   BOTOX  INJECTION N/A 12/22/2018   Procedure: BOTOX  INJECTION INTO ANAL SPHINCTER;  Surgeon: Teresa Lonni HERO, MD;  Location: [facility];  Service: General;  Laterality: N/A;   BREAST BIOPSY Left 11/26/2020   BREAST LUMPECTOMY WITH RADIOACTIVE SEED AND SENTINEL LYMPH NODE BIOPSY Left 01/07/2021   Procedure: LEFT BREAST LUMPECTOMY WITH RADIOACTIVE SEED AND LEFT AXILLARY SENTINEL LYMPH NODE BIOPSY WITH BLUE DYE INJECTION;  Surgeon: Belinda Cough, MD;  Location: [facility];  Service: General;  Laterality: Left;  LMA WITH PECTORAL BLOCK, BLUE DYE INJECTION   CARDIOVERSION N/A 01/04/2018   Procedure: CARDIOVERSION;  Surgeon: Moose Leim DEL, MD;  Location: [facility] END[facility]COPY;  Service: Cardiovascular;  Laterality: N/A;   CARDIOVERSION N/A 09/20/2018   Procedure: CARDIOVERSION;  Surgeon: Shlomo Wilbert SAUNDERS, MD;  Location: [facility]OSCOPY;  Service: Cardiovascular;  Laterality: N/A;   CARDIOVERSION N/A 10/27/2018   Procedure: CARDIOVERSION;  Surgeon: Lonni Slain, MD;  Location: [facility] ENDOSCOPY;  Service: Cardiovascular;  Laterality: N/A;   KNEE SURGERY Bilateral    Torn miniscus    RECTAL EXAM UNDER ANESTHESIA N/A 12/22/2018   Procedure: ANORECTAL  EXAM UNDER ANESTHESIA;  Surgeon: Teresa Lonni HERO, MD;  Location: [facility];  Service: General;  Laterality: N/A;   RECTOCELE REPAIR     TONSILLECTOMY     TOTAL KNEE ARTHROPLASTY Left 07/26/2024   TOTAL KNEE ARTHROPLASTY Right 05/28/2023   VAGINAL HYSTERECTOMY     Patient Active Problem List   Diagnosis Date Noted   Anxiety 05/13/2023   Bilateral lower extremity edema 05/13/2023   Class 3 severe obesity with body mass index (BMI) of 40.0 to 44.9 in adult [facility]) 05/13/2023   Pulmonary hypertension (HCC) 05/13/2023   History of left breast cancer 05/13/2023   Tricuspid valve insufficiency 05/13/2023   Hypercoagulable state due to persistent atrial fibrillation (HCC) 04/13/2023   Anal pain 02/16/2023   B12 deficiency 02/16/2023   Dysphagia 02/16/2023   Family history of colon cancer 02/16/2023   Functional diarrhea 02/16/2023   Knee pain, acute 02/16/2023   Vitamin D  deficiency 02/16/2023   Adenomatous polyp of colon 04/30/2022   Esophageal reflux 04/30/2022   Family history of malignant neoplasm of digestive organs 04/30/2022   Arthralgia of left ankle 04/10/2022   Encounter for monitoring dofetilide  therapy 07/04/2021   Pain in both knees 06/23/2021   Carcinoma of upper-inner quadrant of left breast in female, estrogen receptor positive (HCC) 12/20/2020   Trigger thumb of left hand 06/28/2019   Depression, major, single episode, mild 03/14/2019   Generalized anxiety disorder 03/14/2019   PAF (  paroxysmal atrial fibrillation) (HCC)    Osteoarthritis of knee 04/20/2018   Post-menopausal 02/22/2018   Snoring 01/12/2018   Encounter for cardioversion procedure    Persistent atrial fibrillation 11/11/2017   Hyperlipemia 04/16/2017   Weakness of left hand 05/11/2016   Anxiety related tremor 05/11/2016   Essential hypertension    Normochromic normocytic anemia    Lymphadenopathy 04/12/2014    REFERRING PROVIDER:  Alm Going MD  REFERRING DIAG: Left total knee replacement.    THERAPY DIAG:  Chronic pain of left knee  Stiffness of left knee, not elsewhere classified  Localized edema  Rationale for Evaluation and Treatment: Rehabilitation  ONSET DATE: 07/26/24 (surgery date).    SUBJECTIVE:   SUBJECTIVE STATEMENT: Pt denies any pain, but does report stiffness.  PERTINENT HISTORY: Right TKA.   PAIN:  Are you having pain? No  PRECAUTIONS: Other: No ultrasound.    RED FLAGS: None   WEIGHT BEARING RESTRICTIONS: No  FALLS:  Has patient fallen in last 6 months? No  LIVING ENVIRONMENT: Lives with: lives alone Lives in: House/apartment Has following equipment at home: [name] - 2 wheeled  OCCUPATION: Retired.    PLOF: Independent  PATIENT GOALS: Do more with less pain.    OBJECTIVE:   PATIENT SURVEYS:  LEFS:  56/80.    EDEMA:  Circumferential: 6 cms greater on left than right.    PALPATION: Mild tenderness.  Incisional site appears to be healing well.  Some steri-strips remaining.    LOWER EXTREMITY ROM:  Full active left knee extension and active flexion to 90 degrees and passive to 100 degrees.    LOWER EXTREMITY MMT:  Right knee strength 4+/5.   GAIT: Patient has a FWW but has been walking safely without an assistive device with a decrease in step and stride length.                                                                                                                                TREATMENT DATE:   09/28/24:                                     EXERCISE LOG  Exercise Repetitions and Resistance Comments  Nustep Level 3 x 15 mins; seat 6   Recumbent Bike    Rockerboard  5 mins    14 inch box lunges 5 mins   Forward Step Ups 6 box x 25 reps   LAQs    Seated Marches    Goal Assessment    Cybex Knee Flexion 30# x 2 mins   Cybex Knee Extension 10# x 2 mins   ROM Knee Flexion with Over Pressure    Modalities  Date:  Vaso: Knee, 34 degrees;  medium pressure, 15 mins, Pain and Edema   09/11/24  EXERCISE LOG      6 weeks 09-06-24  LT knee  Exercise Repetitions and Resistance Comments  Nustep Lvl 3 x 15 mins; seat 8   Rockerboard 5 mins   Lunges 14 box x 4 mins   Forward Step Ups 6 in 2x10   LAQs 4# 2 sets of 15 reps   Seated Marches  4# 2 sets of 15 reps   Goal Assessment      Blank cell = exercise not performed today   Modalities  Date:  Vaso: Knee, 34 degrees; low pressure, 15 mins, Pain and Edema   08/31/24:  Nustep level 3 x 16 minutes moving seat forward x 2 to increase flexion  Rockerboard x 5 minutes in parallel bars 14in box lunges x 4 mins    LAQ's with 4#  3x 10 pause top   HS curl green band  3x10     LE elevation with vasopneumatic on low x 15 minutes.     PATIENT EDUCATION:  Education details: Supine knee flexion stretch. Person educated: Pt. Education method: Demo. Education comprehension: Excellent  HOME EXERCISE PROGRAM: As above.    ASSESSMENT:  CLINICAL IMPRESSION: Pt arrives for today's treatment session denying any pain, but does report left knee stiffness and continued swelling this morning.  Pt introduced to cybex knee flexion and extension today with min cues for eccentric control.  PROM performed to left knee into flexion with over pressure to tolerance.  Pt able to demonstrate 118 degrees of left knee flexion post stretching.  Normal responses to vaso noted upon removal.  Pt denied any pain at completion of today's treatment session.  OBJECTIVE IMPAIRMENTS: Abnormal gait, decreased activity tolerance, decreased ROM, increased edema, and pain.   ACTIVITY LIMITATIONS: carrying, lifting, bending, and locomotion level  PARTICIPATION LIMITATIONS: meal prep, cleaning, laundry, community activity, and yard work  [facility] POTENTIAL: Excellent  CLINICAL DECISION MAKING: Stable/uncomplicated  EVALUATION COMPLEXITY: Low   GOALS:  SHORT TERM GOALS: Target  date: 09/08/24.  Ind with an initial HEP. Goal status: MET   LONG TERM GOALS: Target date: 10/06/24.  Ind with an advanced HEP.  Goal status: IN PROGRESS  2.  Active left knee flexion to 120 degrees+ so the patient can perform functional tasks and do so with pain not > 2-3/10.   10/2: 110 degrees Goal status: IN PROGRESS  3. Perform a reciprocating stair gait with one railing with pain not > 2-3/10.  Goal status: IN PROGRESS  4.  Improve LEFS score by at least 20 points.   10/2: 62/80; 10/20: 62/80 Goal status: IN PROGRESS  5.  Perform ADL's with pain not > 3/10.  10/2: average 2/10 Goal status: MET  PLAN:  PT FREQUENCY: 2x/week  PT DURATION: 6 weeks  PLANNED INTERVENTIONS: 97110-Therapeutic exercises, 97530- Therapeutic activity, 97112- Neuromuscular re-education, 97535- Self Care, 02859- Manual therapy, G0283- Electrical stimulation (unattended), 97016- Vasopneumatic device, Patient/Family education, and Cryotherapy  PLAN FOR NEXT SESSION: Nustep, progress to recumbent bike, progress into TKA protocol, vasopneumatic and elevation.     Delon DELENA Gosling, PTA 09/28/2024, 10:01 AM

## 2024-10-04 ENCOUNTER — Ambulatory Visit

## 2024-10-04 DIAGNOSIS — M25562 Pain in left knee: Secondary | ICD-10-CM | POA: Diagnosis not present

## 2024-10-04 DIAGNOSIS — M25662 Stiffness of left knee, not elsewhere classified: Secondary | ICD-10-CM

## 2024-10-04 DIAGNOSIS — G8929 Other chronic pain: Secondary | ICD-10-CM

## 2024-10-04 DIAGNOSIS — R600 Localized edema: Secondary | ICD-10-CM

## 2024-10-04 NOTE — Therapy (Addendum)
 OUTPATIENT PHYSICAL THERAPY LOWER EXTREMITY TREATMENT  Patient Name: [name] Collier MRN: [id number] DOB:July 21, 1946, 78 y.o., female Today's Date: 10/04/2024  END OF SESSION:  PT End of Session - 09/14/24 1105     Visit Number 11   Number of Visits 12    Date for Recertification  10/06/24    PT Start Time [phone]   Activity Tolerance Patient tolerated treatment well    Behavior During Therapy [facility] for tasks assessed/performed           Past Medical History:  Diagnosis Date   Anxiety    Atrial fibrillation (HCC)    Cancer (HCC) 11/2020   left breast [facility]   Frailty 05/13/2023   GERD (gastroesophageal reflux disease)    HTN (hypertension)    Hyperlipidemia    Personal history of radiation therapy    TIA (transient ischemic attack)    Past Surgical History:  Procedure Laterality Date   BOTOX  INJECTION N/A 12/22/2018   Procedure: BOTOX  INJECTION INTO ANAL SPHINCTER;  Surgeon: Teresa Lonni HERO, MD;  Location: [facility];  Service: General;  Laterality: N/A;   BREAST BIOPSY Left 11/26/2020   BREAST LUMPECTOMY WITH RADIOACTIVE SEED AND SENTINEL LYMPH NODE BIOPSY Left 01/07/2021   Procedure: LEFT BREAST LUMPECTOMY WITH RADIOACTIVE SEED AND LEFT AXILLARY SENTINEL LYMPH NODE BIOPSY WITH BLUE DYE INJECTION;  Surgeon: Belinda Cough, MD;  Location: [facility];  Service: General;  Laterality: Left;  LMA WITH PECTORAL BLOCK, BLUE DYE INJECTION   CARDIOVERSION N/A 01/04/2018   Procedure: CARDIOVERSION;  Surgeon: Moose Leim DEL, MD;  Location: [facility] ENDOSCOPY;  Service: Cardiovascular;  Laterality: N/A;   CARDIOVERSION N/A 09/20/2018   Procedure: CARDIOVERSION;  Surgeon: Shlomo Wilbert SAUNDERS, MD;  Location: [facility] ENDOSCOPY;  Service: Cardiovascular;  Laterality: N/A;   CARDIOVERSION N/A 10/27/2018   Procedure: CARDIOVERSION;  Surgeon: Lonni Slain, MD;  Location: [facility] ENDOSCOPY;  Service: Cardiovascular;  Laterality: N/A;   KNEE SURGERY Bilateral    Torn miniscus    RECTAL EXAM UNDER ANESTHESIA N/A 12/22/2018   Procedure: ANORECTAL  EXAM UNDER ANESTHESIA;  Surgeon: Teresa Lonni HERO, MD;  Location: [facility];  Service: General;  Laterality: N/A;   RECTOCELE REPAIR     TONSILLECTOMY     TOTAL KNEE ARTHROPLASTY Left 07/26/2024   TOTAL KNEE ARTHROPLASTY Right 05/28/2023   VAGINAL HYSTERECTOMY     Patient Active Problem List   Diagnosis Date Noted   Anxiety 05/13/2023   Bilateral lower extremity edema 05/13/2023   Class 3 severe obesity with body mass index (BMI) of 40.0 to 44.9 in adult [name] Collier) 05/13/2023   Pulmonary hypertension (HCC) 05/13/2023   History of left breast cancer 05/13/2023   Tricuspid valve insufficiency 05/13/2023   Hypercoagulable state due to persistent atrial fibrillation (HCC) 04/13/2023   Anal pain 02/16/2023   B12 deficiency 02/16/2023   Dysphagia 02/16/2023   Family history of colon cancer 02/16/2023   Functional diarrhea 02/16/2023   Knee pain, acute 02/16/2023   Vitamin D  deficiency 02/16/2023   Adenomatous polyp of colon 04/30/2022   Esophageal reflux 04/30/2022   Family history of malignant neoplasm of digestive organs 04/30/2022   Arthralgia of left ankle 04/10/2022   Encounter for monitoring dofetilide  therapy 07/04/2021   Pain in both knees 06/23/2021   Carcinoma of upper-inner quadrant of left breast in female, estrogen receptor positive (HCC) 12/20/2020   Trigger thumb of left hand 06/28/2019   Depression, major, single episode, mild 03/14/2019   Generalized anxiety disorder 03/14/2019   PAF (  paroxysmal atrial fibrillation) (HCC)    Osteoarthritis of knee 04/20/2018   Post-menopausal 02/22/2018   Snoring 01/12/2018   Encounter for cardioversion procedure    Persistent atrial fibrillation 11/11/2017   Hyperlipemia 04/16/2017   Weakness of left hand 05/11/2016   Anxiety related tremor 05/11/2016   Essential hypertension    Normochromic normocytic anemia    Lymphadenopathy 04/12/2014    REFERRING PROVIDER:  Alm Going MD  REFERRING DIAG: Left total knee replacement.    THERAPY DIAG:  Chronic pain of left knee  Stiffness of left knee, not elsewhere classified  Localized edema  Rationale for Evaluation and Treatment: Rehabilitation  ONSET DATE: 07/26/24 (surgery date).    SUBJECTIVE:   SUBJECTIVE STATEMENT: Pt denies any pain, but does report stiffness.  Pt states that she tweaked her knee getting into a friends car over the weekend.   PERTINENT HISTORY: Right TKA.   PAIN:  Are you having pain? No  PRECAUTIONS: Other: No ultrasound.    RED FLAGS: None   WEIGHT BEARING RESTRICTIONS: No  FALLS:  Has patient fallen in last 6 months? No  LIVING ENVIRONMENT: Lives with: lives alone Lives in: House/apartment Has following equipment at home: [name] - 2 wheeled  OCCUPATION: Retired.    PLOF: Independent  PATIENT GOALS: Do more with less pain.    OBJECTIVE:   PATIENT SURVEYS:  LEFS:  56/80.    EDEMA:  Circumferential: 6 cms greater on left than right.    PALPATION: Mild tenderness.  Incisional site appears to be healing well.  Some steri-strips remaining.    LOWER EXTREMITY ROM:  Full active left knee extension and active flexion to 90 degrees and passive to 100 degrees.    LOWER EXTREMITY MMT:  Right knee strength 4+/5.   GAIT: Patient has a FWW but has been walking safely without an assistive device with a decrease in step and stride length.                                                                                                                                TREATMENT DATE:   09/28/24:                                     EXERCISE LOG  Exercise Repetitions and Resistance Comments  Nustep Level 3 x 15 mins; seat 6   Recumbent Bike    Rockerboard  5 mins    14 inch box lunges 5 mins   Forward Step Ups 6 box x 25 reps   LAQs    Seated Marches    Goal Assessment    Cybex Knee Flexion 30# x 2 mins   Cybex Knee Extension 10# x 2 mins   ROM Knee  Flexion with Over Pressure    Modalities  Date:  Vaso: Knee, 34 degrees; medium pressure, 15 mins, Pain and Edema  09/11/24                                  EXERCISE LOG      6 weeks 09-06-24  LT knee  Exercise Repetitions and Resistance Comments  Nustep Lvl 3 x 15 mins; seat 8   Rockerboard 5 mins   Lunges 14 box x 4 mins   Forward Step Ups 6 in 2x10   LAQs 4# 2 sets of 15 reps   Seated Marches  4# 2 sets of 15 reps   Goal Assessment      Blank cell = exercise not performed today   Modalities  Date:  Vaso: Knee, 34 degrees; low pressure, 15 mins, Pain and Edema   08/31/24:  Nustep level 3 x 16 minutes moving seat forward x 2 to increase flexion  Rockerboard x 5 minutes in parallel bars 14in box lunges x 4 mins    LAQ's with 4#  3x 10 pause top   HS curl green band  3x10     LE elevation with vasopneumatic on low x 15 minutes.     PATIENT EDUCATION:  Education details: Supine knee flexion stretch. Person educated: Pt. Education method: Demo. Education comprehension: Excellent  HOME EXERCISE PROGRAM: As above.    ASSESSMENT:  CLINICAL IMPRESSION: Pt arrives for today's treatment session denying any pain, but does report left knee stiffness and continued swelling this morning.  Pt states that she tweaked her knee getting into a friends car over the weekend, but doesn't think she messed up anything.  Pt able to increased LEFs scoret o 66/80, increasing her score to 10 points making good progress towards her goal.  Pt able to demonstrate 122 degrees of left knee flexion today, surpassing her ROM goal.  Pt able to navigate four stairs with reciprocal pattern and use of one hand rail with pain.  Pt encouraged to call the facility with any questions and concerns.  Pt instructed to continue use of personal TENS unit and ice.  Pt denied any pain at completion of today's treatment session.  Pt ready for discharge at this time.   OBJECTIVE IMPAIRMENTS: Abnormal gait, decreased  activity tolerance, decreased ROM, increased edema, and pain.   ACTIVITY LIMITATIONS: carrying, lifting, bending, and locomotion level  PARTICIPATION LIMITATIONS: meal prep, cleaning, laundry, community activity, and yard work  [facility] POTENTIAL: Excellent  CLINICAL DECISION MAKING: Stable/uncomplicated  EVALUATION COMPLEXITY: Low   GOALS:  SHORT TERM GOALS: Target date: 09/08/24.  Ind with an initial HEP. Goal status: MET   LONG TERM GOALS: Target date: 10/06/24.  Ind with an advanced HEP.  Goal status: MET  2.  Active left knee flexion to 120 degrees+ so the patient can perform functional tasks and do so with pain not > 2-3/10.   10/2: 110 degrees; 10/29: 122 degrees Goal status: MET  3. Perform a reciprocating stair gait with one railing with pain not > 2-3/10.  Goal status: MET  4.  Improve LEFS score by at least 20 points.  10/2: 62/80; 10/20: 62/80; 10/29: 66/80 (10 pt increase) Goal status: IN PROGRESS  5.  Perform ADL's with pain not > 3/10.  10/2: average 2/10 Goal status: MET  PLAN:  PT FREQUENCY: 2x/week  PT DURATION: 6 weeks  PLANNED INTERVENTIONS: 97110-Therapeutic exercises, 97530- Therapeutic activity, 97112- Neuromuscular re-education, 97535- Self Care, [id number]- Manual therapy, G0283- Electrical stimulation (unattended), 97016- Vasopneumatic device, Patient/Family education, and Cryotherapy  PLAN FOR NEXT SESSION: Nustep, progress to recumbent bike, progress into TKA protocol, vasopneumatic and elevation.     Delon DELENA Gosling, PTA 10/04/2024, 10:25 AM     PHYSICAL THERAPY DISCHARGE SUMMARY  Visits from Start of Care: 11.  Current functional level related to goals / functional outcomes: See above.     Remaining deficits: LTG's #1,2,3 and 5 met.     Education / Equipment: HEP.   Patient agrees to discharge. Patient goals were partially met. Patient is being discharged due to being pleased with the current functional level.    Chad  Applegate MPT

## 2024-10-30 ENCOUNTER — Other Ambulatory Visit (HOSPITAL_COMMUNITY): Payer: Self-pay

## 2024-10-30 MED ORDER — DOFETILIDE 500 MCG PO CAPS: 500.0000 ug | ORAL_CAPSULE | Freq: Two times a day (BID) | ORAL | 1 refills | Status: AC

## 2024-11-08 ENCOUNTER — Other Ambulatory Visit: Payer: Self-pay | Admitting: *Deleted

## 2024-11-15 ENCOUNTER — Other Ambulatory Visit: Payer: Self-pay

## 2024-11-15 MED ORDER — ANASTROZOLE 1 MG PO TABS: 1.0000 mg | ORAL_TABLET | Freq: Every day | ORAL | 2 refills | Status: AC

## 2024-11-16 ENCOUNTER — Ambulatory Visit (HOSPITAL_COMMUNITY): Admission: RE | Admit: 2024-11-16 | Discharge: 2024-11-16 | Attending: Internal Medicine | Admitting: Internal Medicine

## 2024-11-16 VITALS — BP 172/82 | HR 60 | Ht 65.0 in | Wt 244.6 lb

## 2024-11-16 DIAGNOSIS — I4819 Other persistent atrial fibrillation: Secondary | ICD-10-CM

## 2024-11-16 DIAGNOSIS — D6869 Other thrombophilia: Secondary | ICD-10-CM

## 2024-11-16 DIAGNOSIS — I4891 Unspecified atrial fibrillation: Secondary | ICD-10-CM | POA: Diagnosis not present

## 2024-11-16 DIAGNOSIS — Z79899 Other long term (current) drug therapy: Secondary | ICD-10-CM | POA: Diagnosis not present

## 2024-11-16 DIAGNOSIS — Z5181 Encounter for therapeutic drug level monitoring: Secondary | ICD-10-CM | POA: Diagnosis not present

## 2024-11-16 NOTE — Progress Notes (Signed)
 Primary Care Physician: Jolinda Norene HERO, DO Referring Physician: Dr. Shlomo Cardiologist: Dr. Lavona EP: Dr. Kelsie Loa [name] Collier is a 78 y.o. female with a h/o afib, HTN, hyperlipidemia and TIA. She is in the afib clinic for f/u of Tikosyn  use. She feels well with NSR today. She did have a mechanical fall a couple of weeks ago and was placed on metoprolol  25 mg bid for afib secondary to the fall. She did convert in a couple of days and then stopped BB with issues with hypotension and bradycardia. Now however, her BP has been running higher, around 150-160 systolic.  Continues on xarelto  20 mg daily with a CHA2DS2VASc of at least 6, without any signs of bleeding.   F/u with afib clinic 06/19/21. She is staying in SR with Tikosyn . She has had an lumpectomy and radiation. Is finished with treatment now and is on daily. Arimidex . She is pending a colonoscopy and has received instruction re holding her DOAC. Reminded to take Tikosyn  with a sip of water.    F/u in afib clinic, 07/22/21,  after a hemo peritoneal bleed around her spleen following a colonoscopy. She had minimal cbc drop and was observed x 2 days in the hospital. It did resolve without any intervention. She was switched form xarelto  to eliquis  on d/c.   F/u in afib clinic, 12/30/21 for ongoing  Tikosyn  use. She is continuing  in SR. No issues with anticoagulation. States that she has has had some recent stomach upset with diarrhea. Will check labs today.   F/u 06/30/22 for tikosyn  surveillance. Ekg shows SR. No afib to report. Has had some issues with L more so than Rt LE cellulitis. Has recently had her 3rd round of antibiotics. Started with an injury to LL leg. Recently fitted with support hose by PCP.   F/u in afib clinic,12/29/22 for Tikosyn  surveillance. She is in SR with stable qt. She had an upper respiratory infection in December but is now resolved. No complaints voiced today.   F/u in afib clinic, 04/12/23. She is currently  in SR. She feels like she has been back in Afib for the past week. She feels tired and short of breath and sometimes feels her heart flipping. She has not checked her heart rhythm at home.   F/u in Afib clinic, 05/12/23. She is currently in NSR. She wore cardiac monitor which showed 8% PVCs and no Afib. Since last office visit, she feels her heart flipping sometimes but perhaps not as frequent as prior. No missed doses of Tikosyn  or anticoagulant.  F/u in Afib clinic, 11/11/23. She is currently in NSR. Patient is here for Tikosyn  surveillance. She has had no episodes of Afib since last office visit. She did have Covid last month and felt like her heart was flip flopping briefly but never went into Afib. No missed doses of Tikosyn  or Eliquis .   Follow up in Afib clinic, 05/18/24. Patient is here for Tikosyn  surveillance. She has had no episodes of Afib since last office visit. She is planning for left knee replacement to be done in August. No missed doses of Tikosyn  or Eliquis .   Follow-up 11/16/2024 for Tikosyn  surveillance.  Patient is currently in NSR.  She has had overall very low A-fib burden since last office visit.  She has recently purchased a Kardia mobile device to monitor her rhythm at home.  No missed doses of Tikosyn .  No bleeding issues on Eliquis .  Today, she denies symptoms of palpitations,  chest pain, shortness of breath, orthopnea, PND, lower extremity edema, dizziness, presyncope, syncope, or neurologic sequela. The patient is tolerating medications without difficulties and is otherwise without complaint today.   Past Medical History:  Diagnosis Date   Anxiety    Atrial fibrillation (HCC)    Cancer (HCC) 11/2020   left breast [facility]   Frailty 05/13/2023   GERD (gastroesophageal reflux disease)    HTN (hypertension)    Hyperlipidemia    Personal history of radiation therapy    TIA (transient ischemic attack)    Past Surgical History:  Procedure Laterality Date   BOTOX  INJECTION  N/A 12/22/2018   Procedure: BOTOX  INJECTION INTO ANAL SPHINCTER;  Surgeon: Teresa Lonni HERO, MD;  Location: [facility];  Service: General;  Laterality: N/A;   BREAST BIOPSY Left 11/26/2020   BREAST LUMPECTOMY WITH RADIOACTIVE SEED AND SENTINEL LYMPH NODE BIOPSY Left 01/07/2021   Procedure: LEFT BREAST LUMPECTOMY WITH RADIOACTIVE SEED AND LEFT AXILLARY SENTINEL LYMPH NODE BIOPSY WITH BLUE DYE INJECTION;  Surgeon: Belinda Cough, MD;  Location: [facility];  Service: General;  Laterality: Left;  LMA WITH PECTORAL BLOCK, BLUE DYE INJECTION   CARDIOVERSION N/A 01/04/2018   Procedure: CARDIOVERSION;  Surgeon: [facility] Leim DEL, MD;  Location: [facility] ENDOSCOPY;  Service: Cardiovascular;  Laterality: N/A;   CARDIOVERSION N/A 09/20/2018   Procedure: CARDIOVERSION;  Surgeon: Shlomo Wilbert SAUNDERS, MD;  Location: [facility] ENDOSCOPY;  Service: Cardiovascular;  Laterality: N/A;   CARDIOVERSION N/A 10/27/2018   Procedure: CARDIOVERSION;  Surgeon: Lonni Slain, MD;  Location: [facility] ENDOSCOPY;  Service: Cardiovascular;  Laterality: N/A;   KNEE SURGERY Bilateral    Torn miniscus   RECTAL EXAM UNDER ANESTHESIA N/A 12/22/2018   Procedure: ANORECTAL  EXAM UNDER ANESTHESIA;  Surgeon: Teresa Lonni HERO, MD;  Location: [facility];  Service: General;  Laterality: N/A;   RECTOCELE REPAIR     TONSILLECTOMY     TOTAL KNEE ARTHROPLASTY Left 07/26/2024   TOTAL KNEE ARTHROPLASTY Right 05/28/2023   VAGINAL HYSTERECTOMY      Current Outpatient Medications  Medication Sig Dispense Refill   acetaminophen  (TYLENOL ) 500 MG tablet Take 1,000 mg by mouth as needed for moderate pain (pain score 4-6) or headache.     amLODipine  (NORVASC ) 2.5 MG tablet Take 1 tablet (2.5 mg total) by mouth daily. 90 tablet 3   anastrozole  (ARIMIDEX ) 1 MG tablet Take 1 tablet (1 mg total) by mouth daily. 90 tablet 2   apixaban  (ELIQUIS ) 5 MG TABS tablet Take 1 tablet (5 mg total) by mouth 2 (two) times daily. 180 tablet 3   Cholecalciferol   (VITAMIN D3) 5000 units CAPS Take 5,000 Units by mouth daily with supper.     dofetilide  (TIKOSYN ) 500 MCG capsule Take 1 capsule (500 mcg total) by mouth 2 (two) times daily. 180 capsule 1   ezetimibe  (ZETIA ) 10 MG tablet Take 1 tablet (10 mg total) by mouth daily. 90 tablet 3   famotidine  (PEPCID ) 20 MG tablet Take 1 tablet (20 mg total) by mouth 2 (two) times daily as needed for heartburn or indigestion. 180 tablet 3   furosemide  (LASIX ) 20 MG tablet Take 1 tablet (20 mg total) by mouth daily. 90 tablet 3   LORazepam  (ATIVAN ) 0.5 MG tablet Take 0.5-1 tablets (0.25-0.5 mg total) by mouth daily as needed for anxiety (put on hold). 20 tablet 2   nystatin  cream (MYCOSTATIN ) Apply 1 Application topically 2 (two) times daily. X5-10 days as needed for rash in groin area 30 g 2  olmesartan  (BENICAR ) 5 MG tablet Take 1 tablet (5 mg total) by mouth daily. 90 tablet 3   potassium chloride  SA (KLOR-CON  M) 20 MEQ tablet Take 1 tablet (20 mEq total) by mouth 2 (two) times daily. 180 tablet 3   No current facility-administered medications for this encounter.    Allergies  Allergen Reactions   Augmentin [Amoxicillin-Pot Clavulanate] Other (See Comments)    c-diff Has patient had a PCN reaction causing immediate rash, facial/tongue/throat swelling, SOB or lightheadedness with hypotension: No Has patient had a PCN reaction causing severe rash involving mucus membranes or skin necrosis: No Has patient had a PCN reaction that required hospitalization: No Has patient had a PCN reaction occurring within the last 10 years: Yes If all of the above answers are NO, then may proceed with Cephalosporin use.    Codeine Palpitations and Rash   Prednisone  Other (See Comments)    Jittery, red in the face   Celecoxib Other (See Comments)   ROS- All systems are reviewed and negative except as per the HPI above  Physical Exam: Vitals:   11/16/24 1112  BP: (!) 172/82  Pulse: 60  Weight: 110.9 kg  Height: 5' 5  (1.651 m)     Wt Readings from Last 3 Encounters:  11/16/24 110.9 kg  09/01/24 106.7 kg  07/19/24 110.6 kg    Labs: Lab Results  Component Value Date   NA 142 09/01/2024   K 4.8 09/01/2024   CL 104 09/01/2024   CO2 23 09/01/2024   GLUCOSE 95 09/01/2024   BUN 16 09/01/2024   CREATININE 0.75 09/01/2024   CALCIUM  9.7 09/01/2024   PHOS 4.1 06/30/2021   MG 2.1 05/18/2024   Lab Results  Component Value Date   INR 1.7 (H) 06/29/2021   Lab Results  Component Value Date   CHOL 188 09/01/2024   HDL 66 09/01/2024   LDLCALC 100 (H) 09/01/2024   TRIG 126 09/01/2024   GEN- The patient is well appearing, alert and oriented x 3 today.   Neck - no JVD or carotid bruit noted Lungs- Clear to ausculation bilaterally, normal work of breathing Heart- Regular rate and rhythm, no murmurs, rubs or gallops, PMI not laterally displaced Extremities- no clubbing, cyanosis, or edema Skin - no rash or ecchymosis noted   EKG - EKG Interpretation Date/Time:  Thursday November 16 2024 11:25:19 EST Ventricular Rate:  60 PR Interval:  224 QRS Duration:  136 QT Interval:  470 QTC Calculation: 470 R Axis:   -51  Text Interpretation: Sinus rhythm with 1st degree A-V block with occasional Premature ventricular complexes Left axis deviation Right bundle branch block Minimal voltage criteria for LVH, may be normal variant ( R in aVL ) Inferior infarct (cited on or before 27-Oct-2018) Anterolateral infarct (cited on or before 27-Oct-2018) Abnormal ECG When compared with ECG of 18-May-2024 10:38, Premature ventricular complexes are now Present Confirmed by Terra Pac ([id number]) on 11/16/2024 11:29:40 AM     Echo (outside study) 05/24/2023: Impression  Left Ventricle: Systolic function is normal. EF: 55-60%.   Left Ventricle: Wall motion is normal.   Tricuspid Valve: There is mild to moderate regurgitation.  Cardiac monitor 5/6-20/24:  Patch Wear Time:  13 days and 19 hours    Predominant rhythm  was sinus rhythm 2 SVT episodes, longest and fastest 1 minute 17 seconds at 130 bpm No atrial fibrillation Less than 1% supraventricular ectopy 8% ventricular ectopy Patient triggered episodes associated with both sinus rhythm and sinus rhythm with  PVCs  CHA2DS2-VASc Score = 6  The patient's score is based upon: CHF History: 0 HTN History: 1 Diabetes History: 0 Stroke History: 2 Vascular Disease History: 0 Age Score: 2 Gender Score: 1       ASSESSMENT AND PLAN: Persistent Atrial Fibrillation (ICD10:  I48.19) The patient's CHA2DS2-VASc score is 6, indicating a 9.7% annual risk of stroke.   Patient has been on Tikosyn  since 10/2018.   Patient appears to be maintaining SR.  She is happy with overall management of A-fib.  No changes recommended at this time.  High risk medication monitoring (ICD10: J342684) Patient requires ongoing monitoring for anti-arrhythmic medication which has the potential to cause life threatening arrhythmias or AV block. Qtc stable. Continue Tikosyn  500 mcg twice daily. Basic metabolic panel and yahoo level drawn today.  Secondary Hypercoagulable State (ICD10:  D68.69) The patient is at significant risk for stroke/thromboembolism based upon her CHA2DS2-VASc Score of 6.  Continue Apixaban  (Eliquis ).  Continue Eliquis  5 mg twice daily.   HTN Elevated today due to traffic on the way to appointment.  BP recheck still elevated.  She notes systolic at home is much lower ~130s. Continue to trend BP at home.    Follow up with Afib clinic in 6 months for Tikosyn  surveillance.  [name] Heinrich, PA-C Afib Clinic [facility] [address] [place], [place] [address] [phone]

## 2024-11-17 ENCOUNTER — Ambulatory Visit (HOSPITAL_COMMUNITY): Payer: Self-pay | Admitting: Internal Medicine

## 2024-11-17 LAB — BASIC METABOLIC PANEL WITH GFR
BUN/Creatinine Ratio: 19 (ref 12–28)
BUN: 14 mg/dL (ref 8–27)
CO2: 24 mmol/L (ref 20–29)
Calcium: 9.7 mg/dL (ref 8.7–10.3)
Chloride: 103 mmol/L (ref 96–106)
Creatinine, Ser: 0.72 mg/dL (ref 0.57–1.00)
Glucose: 89 mg/dL (ref 70–99)
Potassium: 4.6 mmol/L (ref 3.5–5.2)
Sodium: 143 mmol/L (ref 134–144)
eGFR: 86 mL/min/1.73 (ref >59)

## 2024-11-17 LAB — MAGNESIUM: Magnesium: 1.9 mg/dL (ref 1.6–2.3)

## 2024-12-11 ENCOUNTER — Other Ambulatory Visit: Payer: Self-pay | Admitting: Family Medicine

## 2024-12-11 ENCOUNTER — Encounter: Payer: Self-pay | Admitting: Surgery

## 2024-12-11 DIAGNOSIS — Z1231 Encounter for screening mammogram for malignant neoplasm of breast: Secondary | ICD-10-CM

## 2024-12-26 ENCOUNTER — Encounter: Payer: Self-pay | Admitting: Family Medicine

## 2024-12-27 ENCOUNTER — Encounter: Payer: Self-pay | Admitting: Family Medicine

## 2024-12-27 ENCOUNTER — Ambulatory Visit

## 2024-12-27 VITALS — BP 125/80 | HR 70 | Temp 99.8°F | Ht 65.0 in | Wt 246.5 lb

## 2024-12-27 DIAGNOSIS — J208 Acute bronchitis due to other specified organisms: Secondary | ICD-10-CM

## 2024-12-27 DIAGNOSIS — B9689 Other specified bacterial agents as the cause of diseases classified elsewhere: Secondary | ICD-10-CM | POA: Diagnosis not present

## 2024-12-27 DIAGNOSIS — J101 Influenza due to other identified influenza virus with other respiratory manifestations: Secondary | ICD-10-CM

## 2024-12-27 LAB — VERITOR SARS-COV-2 AND FLU A+B
BD Veritor SARS-CoV-2 Ag: NEGATIVE
Influenza A: NEGATIVE
Influenza B: POSITIVE — AB

## 2024-12-27 LAB — RSV AG, IMMUNOCHR, WAIVED: RSV Ag, Immunochr, Waived: NEGATIVE

## 2024-12-27 MED ORDER — BENZONATATE 200 MG PO CAPS: 200.0000 mg | ORAL_CAPSULE | Freq: Two times a day (BID) | ORAL | 0 refills | Status: AC | PRN

## 2024-12-27 MED ORDER — HYDROCODONE BIT-HOMATROP MBR 5-1.5 MG/5ML PO SOLN: 5.0000 mL | Freq: Three times a day (TID) | ORAL | 0 refills | Status: AC | PRN

## 2024-12-27 MED ORDER — DOXYCYCLINE HYCLATE 100 MG PO TABS: 100.0000 mg | ORAL_TABLET | Freq: Two times a day (BID) | ORAL | 0 refills | Status: AC

## 2024-12-27 NOTE — Progress Notes (Signed)
 "  Subjective: CC: URI PCP: Jolinda [name] HERO, DO YEP:[name] Collier is a 79 y.o. female presenting to clinic today for:  Patient reports onset of symptoms on Saturday.  She notes it started with just something mild and since has progressed to a cough, congestion, rhinorrhea.  No measured fevers.  No myalgia.  No nausea, vomiting or diarrhea reported.  She has been utilizing nothing so far for medicines because she has been out of everything.  Denies any wheezing or shortness of breath.  She gets something similar yearly.  No known sick contacts.  She did have some leftover promethazine  with dextromethorphan but it was from 2024 so she was reluctant to use.   ROS: Per HPI  Allergies[1] Past Medical History:  Diagnosis Date   Anxiety    Atrial fibrillation (HCC)    Cancer (HCC) 11/2020   left breast [facility]   Frailty 05/13/2023   GERD (gastroesophageal reflux disease)    HTN (hypertension)    Hyperlipidemia    Personal history of radiation therapy    TIA (transient ischemic attack)    Current Medications[2] Social History   Socioeconomic History   Marital status: Widowed    Spouse name: Not on file   Number of children: 2   Years of education: 54   Highest education level: 12th grade  Occupational History   Occupation: Retired    Comment: Geophysical Data Processor  Tobacco Use   Smoking status: Never   Smokeless tobacco: Never   Tobacco comments:    Never smoke 06/30/22  Vaping Use   Vaping status: Never Used  Substance and Sexual Activity   Alcohol use: No    Alcohol/week: 0.0 standard drinks of alcohol   Drug use: No   Sexual activity: Not Currently    Birth control/protection: Post-menopausal  Other Topics Concern   Not on file  Social History Narrative   Lives alone - children in [place] and [place] -    Caffeine use: Soda/tea daily   Good church support group   Social Drivers of Health   Tobacco Use: Low Risk (12/27/2024)   Patient History    Smoking Tobacco  Use: Never    Smokeless Tobacco Use: Never    Passive Exposure: Not on file  Financial Resource Strain: Low Risk (12/26/2024)   Overall Financial Resource Strain (CARDIA)    Difficulty of Paying Living Expenses: Not very hard  Food Insecurity: No Food Insecurity (12/26/2024)   Epic    Worried About Radiation Protection Practitioner of Food in the Last Year: Never true    Ran Out of Food in the Last Year: Never true  Transportation Needs: No Transportation Needs (12/26/2024)   Epic    Lack of Transportation (Medical): No    Lack of Transportation (Non-Medical): No  Physical Activity: Inactive (12/26/2024)   Exercise Vital Sign    Days of Exercise per Week: 0 days    Minutes of Exercise per Session: Not on file  Stress: Stress Concern Present (12/26/2024)   [employer] of Occupational Health - Occupational Stress Questionnaire    Feeling of Stress: To some extent  Social Connections: Moderately Integrated (12/26/2024)   Social Connection and Isolation Panel    Frequency of Communication with Friends and Family: More than three times a week    Frequency of Social Gatherings with Friends and Family: Twice a week    Attends Religious Services: More than 4 times per year    Active Member of [employer] or Organizations: Yes  Attends Banker Meetings: More than 4 times per year    Marital Status: Widowed  Intimate Partner Violence: Not At Risk (07/26/2024)   Received from [facility]   HITS    Over the last 12 months how often did your partner physically hurt you?: Never    Over the last 12 months how often did your partner insult you or talk down to you?: Never    Over the last 12 months how often did your partner threaten you with physical harm?: Never    Over the last 12 months how often did your partner scream or curse at you?: Never  Depression (PHQ2-9): Low Risk (09/01/2024)   Depression (PHQ2-9)    PHQ-2 Score: 4  Alcohol Screen: Low Risk (04/03/2024)   Alcohol Screen    Last Alcohol  Screening Score (AUDIT): 0  Housing: Unknown (12/26/2024)   Epic    Unable to Pay for Housing in the Last Year: No    Number of Times Moved in the Last Year: Not on file    Homeless in the Last Year: No  Utilities: Not At Risk (07/26/2024)   Received from [facility]    In the past 12 months has the electric, gas, oil, or water company threatened to shut off services in your home?: No  Health Literacy: Adequate Health Literacy (04/03/2024)   B1300 Health Literacy    Frequency of need for help with medical instructions: Never   Family History  Problem Relation Age of Onset   Hypertension Mother    Hyperlipidemia Mother    Neurologic Disorder Mother 53       GB   Stroke Son    Stroke Maternal Uncle    Stroke Grandchild    Prostate cancer Brother     Objective: Office vital signs reviewed. BP 125/80   Pulse 70   Temp 99.8 F (37.7 C)   Ht 5' 5 (1.651 m)   Wt 246 lb 8 oz (111.8 kg)   SpO2 97%   BMI 41.02 kg/m   Physical Examination:  General: Awake, alert, well nourished, No acute distress HEENT: Normal    Neck: No masses palpated. No lymphadenopathy    Ears: Tympanic membranes intact, normal light reflex, no erythema, no bulging    Eyes: PERRLA, extraocular membranes intact, sclera white    Nose: nasal turbinates moist, clear nasal discharge    Throat: moist mucus membranes, mild oropharyngeal erythema, no tonsillar exudate.  Airway is patent Cardio: regular rate and rhythm, S1S2 heard, + murmurs appreciated Pulm: Coarse breath sounds in bilateral bases with normal work of breathing on room air and no wheezes, rhonchi or rails noted.  No dyspnea with speech or exertion.  No coughing observed.  Assessment/ Plan: 79 y.o. female   Acute bacterial bronchitis - Plan: Veritor SARS-CoV-2 and Flu A+B, RSV Ag, Immunochr, Waived, HYDROcodone  bit-homatropine (HYCODAN) 5-1.5 MG/5ML syrup, benzonatate  (TESSALON ) 200 MG capsule, doxycycline  (VIBRA -TABS) 100 MG  tablet  Influenza B   She did test positive for influenza B.  Sadly she is outside of the window for treatment.  Given bibasilar coarse breath sounds a more inclined to treat her as an acute bacterial bronchitis.  I have gone ahead and put her on doxycycline , Tessalon  Perles.  Hycodan given for as needed use.  Caution sedation.  We discussed red flag signs and symptoms warranting further evaluation and she voiced good understanding.  Follow-up as needed   [name] CHRISTELLA Fielding, DO [facility] [phone]     [1]  Allergies Allergen Reactions   Augmentin [Amoxicillin-Pot Clavulanate] Other (See Comments)    c-diff Has patient had a PCN reaction causing immediate rash, facial/tongue/throat swelling, SOB or lightheadedness with hypotension: No Has patient had a PCN reaction causing severe rash involving mucus membranes or skin necrosis: No Has patient had a PCN reaction that required hospitalization: No Has patient had a PCN reaction occurring within the last 10 years: Yes If all of the above answers are NO, then may proceed with Cephalosporin use.    Codeine Palpitations and Rash   Prednisone  Other (See Comments)    Jittery, red in the face   Celecoxib Other (See Comments)  [2]  Current Outpatient Medications:    acetaminophen  (TYLENOL ) 500 MG tablet, Take 1,000 mg by mouth as needed for moderate pain (pain score 4-6) or headache., Disp: , Rfl:    amLODipine  (NORVASC ) 2.5 MG tablet, Take 1 tablet (2.5 mg total) by mouth daily., Disp: 90 tablet, Rfl: 3   anastrozole  (ARIMIDEX ) 1 MG tablet, Take 1 tablet (1 mg total) by mouth daily., Disp: 90 tablet, Rfl: 2   apixaban  (ELIQUIS ) 5 MG TABS tablet, Take 1 tablet (5 mg total) by mouth 2 (two) times daily., Disp: 180 tablet, Rfl: 3   Cholecalciferol  (VITAMIN D3) 5000 units CAPS, Take 5,000 Units by mouth daily with supper., Disp: , Rfl:    dofetilide  (TIKOSYN ) 500 MCG capsule, Take 1 capsule (500 mcg total) by mouth  2 (two) times daily., Disp: 180 capsule, Rfl: 1   ezetimibe  (ZETIA ) 10 MG tablet, Take 1 tablet (10 mg total) by mouth daily., Disp: 90 tablet, Rfl: 3   famotidine  (PEPCID ) 20 MG tablet, Take 1 tablet (20 mg total) by mouth 2 (two) times daily as needed for heartburn or indigestion., Disp: 180 tablet, Rfl: 3   furosemide  (LASIX ) 20 MG tablet, Take 1 tablet (20 mg total) by mouth daily., Disp: 90 tablet, Rfl: 3   LORazepam  (ATIVAN ) 0.5 MG tablet, Take 0.5-1 tablets (0.25-0.5 mg total) by mouth daily as needed for anxiety (put on hold)., Disp: 20 tablet, Rfl: 2   nystatin  cream (MYCOSTATIN ), Apply 1 Application topically 2 (two) times daily. X5-10 days as needed for rash in groin area, Disp: 30 g, Rfl: 2   olmesartan  (BENICAR ) 5 MG tablet, Take 1 tablet (5 mg total) by mouth daily., Disp: 90 tablet, Rfl: 3   potassium chloride  SA (KLOR-CON  M) 20 MEQ tablet, Take 1 tablet (20 mEq total) by mouth 2 (two) times daily., Disp: 180 tablet, Rfl: 3  "

## 2025-01-08 ENCOUNTER — Ambulatory Visit

## 2025-01-12 ENCOUNTER — Ambulatory Visit: Admission: RE | Admit: 2025-01-12 | Source: Ambulatory Visit

## 2025-01-12 DIAGNOSIS — Z1231 Encounter for screening mammogram for malignant neoplasm of breast: Secondary | ICD-10-CM

## 2025-03-21 ENCOUNTER — Encounter: Payer: Self-pay | Admitting: Family Medicine

## 2025-04-03 ENCOUNTER — Encounter

## 2025-04-04 ENCOUNTER — Encounter: Admitting: Family Medicine

## 2025-05-18 ENCOUNTER — Ambulatory Visit (HOSPITAL_COMMUNITY): Admitting: Internal Medicine

## 2025-07-19 ENCOUNTER — Ambulatory Visit: Admitting: Hematology and Oncology
